# Patient Record
Sex: Male | Born: 1940 | Race: White | Hispanic: No | Marital: Married | Smoking: Former smoker
Health system: Southern US, Community
[De-identification: ages and names within clinical notes are randomized; demographics above are authoritative.]

## PROBLEM LIST (undated history)

## (undated) DIAGNOSIS — N390 Urinary tract infection, site not specified: Secondary | ICD-10-CM

## (undated) DIAGNOSIS — I10 Essential (primary) hypertension: Secondary | ICD-10-CM

## (undated) DIAGNOSIS — J449 Chronic obstructive pulmonary disease, unspecified: Secondary | ICD-10-CM

## (undated) DIAGNOSIS — G20A1 Parkinson's disease without dyskinesia, without mention of fluctuations: Secondary | ICD-10-CM

## (undated) DIAGNOSIS — K219 Gastro-esophageal reflux disease without esophagitis: Secondary | ICD-10-CM

## (undated) DIAGNOSIS — N400 Enlarged prostate without lower urinary tract symptoms: Secondary | ICD-10-CM

## (undated) DIAGNOSIS — R748 Abnormal levels of other serum enzymes: Secondary | ICD-10-CM

## (undated) DIAGNOSIS — M75100 Unspecified rotator cuff tear or rupture of unspecified shoulder, not specified as traumatic: Secondary | ICD-10-CM

## (undated) DIAGNOSIS — B1910 Unspecified viral hepatitis B without hepatic coma: Secondary | ICD-10-CM

## (undated) DIAGNOSIS — G20 Parkinson's disease: Secondary | ICD-10-CM

## (undated) DIAGNOSIS — J439 Emphysema, unspecified: Secondary | ICD-10-CM

## (undated) DIAGNOSIS — K259 Gastric ulcer, unspecified as acute or chronic, without hemorrhage or perforation: Secondary | ICD-10-CM

## (undated) HISTORY — PX: TONSILLECTOMY: SUR1361

## (undated) HISTORY — PX: CHOLECYSTECTOMY: SHX55

## (undated) HISTORY — DX: Emphysema, unspecified: J43.9

## (undated) HISTORY — DX: Benign prostatic hyperplasia without lower urinary tract symptoms: N40.0

## (undated) HISTORY — PX: TRANSURETHRAL MICROWAVE THERAPY: SUR1394

---

## 2003-02-04 ENCOUNTER — Encounter: Payer: Self-pay | Admitting: Internal Medicine

## 2003-12-04 ENCOUNTER — Observation Stay (HOSPITAL_COMMUNITY): Admission: EM | Admit: 2003-12-04 | Discharge: 2003-12-05 | Payer: Self-pay | Admitting: Emergency Medicine

## 2004-08-17 ENCOUNTER — Ambulatory Visit: Payer: Self-pay | Admitting: Internal Medicine

## 2004-09-21 ENCOUNTER — Ambulatory Visit: Payer: Self-pay | Admitting: Internal Medicine

## 2004-11-19 ENCOUNTER — Emergency Department: Payer: Self-pay | Admitting: General Practice

## 2004-11-22 ENCOUNTER — Encounter: Payer: Self-pay | Admitting: Internal Medicine

## 2004-12-21 ENCOUNTER — Ambulatory Visit: Payer: Self-pay | Admitting: Internal Medicine

## 2005-02-15 ENCOUNTER — Ambulatory Visit: Payer: Self-pay | Admitting: Internal Medicine

## 2005-04-09 ENCOUNTER — Emergency Department: Payer: Self-pay | Admitting: Emergency Medicine

## 2005-04-16 ENCOUNTER — Ambulatory Visit: Payer: Self-pay | Admitting: Urology

## 2005-05-02 ENCOUNTER — Ambulatory Visit: Payer: Self-pay | Admitting: Urology

## 2005-05-02 ENCOUNTER — Other Ambulatory Visit: Payer: Self-pay

## 2005-06-07 ENCOUNTER — Ambulatory Visit: Payer: Self-pay | Admitting: Internal Medicine

## 2005-10-18 ENCOUNTER — Ambulatory Visit: Payer: Self-pay | Admitting: Internal Medicine

## 2006-04-11 ENCOUNTER — Ambulatory Visit: Payer: Self-pay | Admitting: Internal Medicine

## 2006-05-27 ENCOUNTER — Ambulatory Visit: Payer: Self-pay | Admitting: Family Medicine

## 2006-05-30 ENCOUNTER — Ambulatory Visit: Payer: Self-pay | Admitting: Family Medicine

## 2006-06-06 ENCOUNTER — Ambulatory Visit: Payer: Self-pay | Admitting: Internal Medicine

## 2006-07-22 ENCOUNTER — Ambulatory Visit: Payer: Self-pay | Admitting: Internal Medicine

## 2006-12-05 ENCOUNTER — Ambulatory Visit: Payer: Self-pay | Admitting: Internal Medicine

## 2006-12-05 DIAGNOSIS — M7500 Adhesive capsulitis of unspecified shoulder: Secondary | ICD-10-CM | POA: Insufficient documentation

## 2006-12-12 ENCOUNTER — Encounter: Payer: Self-pay | Admitting: Internal Medicine

## 2007-01-02 ENCOUNTER — Encounter: Payer: Self-pay | Admitting: Internal Medicine

## 2007-02-27 ENCOUNTER — Ambulatory Visit: Payer: Self-pay | Admitting: Internal Medicine

## 2007-02-27 DIAGNOSIS — K279 Peptic ulcer, site unspecified, unspecified as acute or chronic, without hemorrhage or perforation: Secondary | ICD-10-CM | POA: Insufficient documentation

## 2007-02-27 DIAGNOSIS — M719 Bursopathy, unspecified: Secondary | ICD-10-CM

## 2007-02-27 DIAGNOSIS — I10 Essential (primary) hypertension: Secondary | ICD-10-CM | POA: Insufficient documentation

## 2007-02-27 DIAGNOSIS — N400 Enlarged prostate without lower urinary tract symptoms: Secondary | ICD-10-CM | POA: Insufficient documentation

## 2007-02-27 DIAGNOSIS — K219 Gastro-esophageal reflux disease without esophagitis: Secondary | ICD-10-CM | POA: Insufficient documentation

## 2007-02-27 DIAGNOSIS — Z860100 Personal history of colon polyps, unspecified: Secondary | ICD-10-CM | POA: Insufficient documentation

## 2007-02-27 DIAGNOSIS — J449 Chronic obstructive pulmonary disease, unspecified: Secondary | ICD-10-CM | POA: Insufficient documentation

## 2007-02-27 DIAGNOSIS — M67919 Unspecified disorder of synovium and tendon, unspecified shoulder: Secondary | ICD-10-CM | POA: Insufficient documentation

## 2007-05-22 ENCOUNTER — Telehealth (INDEPENDENT_AMBULATORY_CARE_PROVIDER_SITE_OTHER): Payer: Self-pay | Admitting: *Deleted

## 2007-06-05 DIAGNOSIS — N200 Calculus of kidney: Secondary | ICD-10-CM | POA: Insufficient documentation

## 2007-07-03 ENCOUNTER — Ambulatory Visit: Payer: Self-pay | Admitting: Internal Medicine

## 2007-07-03 DIAGNOSIS — J019 Acute sinusitis, unspecified: Secondary | ICD-10-CM | POA: Insufficient documentation

## 2007-07-06 LAB — CONVERTED CEMR LAB
BUN: 10 mg/dL (ref 6–23)
Basophils Absolute: 0.1 10*3/uL (ref 0.0–0.1)
Basophils Relative: 0.7 % (ref 0.0–1.0)
Eosinophils Absolute: 0.2 10*3/uL (ref 0.0–0.6)
GFR calc non Af Amer: 79 mL/min
MCHC: 32.6 g/dL (ref 30.0–36.0)
MCV: 103.5 fL — ABNORMAL HIGH (ref 78.0–100.0)
Neutrophils Relative %: 56.4 % (ref 43.0–77.0)
RBC: 4.8 M/uL (ref 4.22–5.81)
RDW: 12.2 % (ref 11.5–14.6)
Sodium: 141 meq/L (ref 135–145)
TSH: 0.41 microintl units/mL (ref 0.35–5.50)
WBC: 7.4 10*3/uL (ref 4.5–10.5)

## 2007-07-14 ENCOUNTER — Telehealth (INDEPENDENT_AMBULATORY_CARE_PROVIDER_SITE_OTHER): Payer: Self-pay | Admitting: *Deleted

## 2007-08-27 ENCOUNTER — Telehealth (INDEPENDENT_AMBULATORY_CARE_PROVIDER_SITE_OTHER): Payer: Self-pay | Admitting: *Deleted

## 2008-01-01 ENCOUNTER — Ambulatory Visit: Payer: Self-pay | Admitting: Internal Medicine

## 2008-01-06 ENCOUNTER — Other Ambulatory Visit: Payer: Self-pay

## 2008-01-06 ENCOUNTER — Emergency Department: Payer: Self-pay | Admitting: Emergency Medicine

## 2008-02-15 ENCOUNTER — Encounter: Payer: Self-pay | Admitting: Internal Medicine

## 2008-02-16 ENCOUNTER — Ambulatory Visit: Payer: Self-pay | Admitting: Internal Medicine

## 2008-02-17 ENCOUNTER — Telehealth: Payer: Self-pay | Admitting: Internal Medicine

## 2008-03-16 ENCOUNTER — Ambulatory Visit: Payer: Self-pay | Admitting: Unknown Physician Specialty

## 2008-03-16 ENCOUNTER — Encounter: Payer: Self-pay | Admitting: Internal Medicine

## 2008-05-19 ENCOUNTER — Encounter: Payer: Self-pay | Admitting: Internal Medicine

## 2008-07-08 ENCOUNTER — Telehealth: Payer: Self-pay | Admitting: Internal Medicine

## 2009-01-13 ENCOUNTER — Ambulatory Visit: Payer: Self-pay | Admitting: Anesthesiology

## 2009-03-10 ENCOUNTER — Encounter: Payer: Self-pay | Admitting: Internal Medicine

## 2009-04-28 ENCOUNTER — Ambulatory Visit: Payer: Self-pay | Admitting: Unknown Physician Specialty

## 2010-06-10 LAB — CONVERTED CEMR LAB
Bilirubin Urine: NEGATIVE
Blood in Urine, dipstick: NEGATIVE
Glucose, Urine, Semiquant: NEGATIVE
Ketones, urine, test strip: NEGATIVE
Nitrite: NEGATIVE
Protein, U semiquant: NEGATIVE
Specific Gravity, Urine: 1.02
Urobilinogen, UA: 0.2
WBC Urine, dipstick: NEGATIVE
pH: 6

## 2010-08-05 ENCOUNTER — Observation Stay (HOSPITAL_COMMUNITY)
Admission: EM | Admit: 2010-08-05 | Discharge: 2010-08-07 | DRG: 087 | Disposition: A | Discharge status: Home / Self Care | Payer: BC Managed Care – PPO | Attending: Internal Medicine | Admitting: Internal Medicine

## 2010-08-05 ENCOUNTER — Emergency Department (HOSPITAL_COMMUNITY): Payer: BC Managed Care – PPO

## 2010-08-05 DIAGNOSIS — J440 Chronic obstructive pulmonary disease with acute lower respiratory infection: Secondary | ICD-10-CM | POA: Insufficient documentation

## 2010-08-05 DIAGNOSIS — I10 Essential (primary) hypertension: Secondary | ICD-10-CM | POA: Insufficient documentation

## 2010-08-05 DIAGNOSIS — R05 Cough: Secondary | ICD-10-CM | POA: Insufficient documentation

## 2010-08-05 DIAGNOSIS — J209 Acute bronchitis, unspecified: Secondary | ICD-10-CM | POA: Insufficient documentation

## 2010-08-05 DIAGNOSIS — F17200 Nicotine dependence, unspecified, uncomplicated: Secondary | ICD-10-CM | POA: Insufficient documentation

## 2010-08-05 DIAGNOSIS — J9600 Acute respiratory failure, unspecified whether with hypoxia or hypercapnia: Principal | ICD-10-CM | POA: Insufficient documentation

## 2010-08-05 DIAGNOSIS — E876 Hypokalemia: Secondary | ICD-10-CM | POA: Insufficient documentation

## 2010-08-05 DIAGNOSIS — T7840XA Allergy, unspecified, initial encounter: Secondary | ICD-10-CM | POA: Insufficient documentation

## 2010-08-05 DIAGNOSIS — R109 Unspecified abdominal pain: Secondary | ICD-10-CM | POA: Insufficient documentation

## 2010-08-05 DIAGNOSIS — R059 Cough, unspecified: Secondary | ICD-10-CM | POA: Insufficient documentation

## 2010-08-05 DIAGNOSIS — N400 Enlarged prostate without lower urinary tract symptoms: Secondary | ICD-10-CM | POA: Insufficient documentation

## 2010-08-05 DIAGNOSIS — R0789 Other chest pain: Secondary | ICD-10-CM | POA: Insufficient documentation

## 2010-08-05 LAB — CBC
Hemoglobin: 15.4 g/dL (ref 13.0–17.0)
MCH: 34.8 pg — ABNORMAL HIGH (ref 26.0–34.0)
MCHC: 36 g/dL (ref 30.0–36.0)
Platelets: 179 10*3/uL (ref 150–400)
RBC: 4.43 MIL/uL (ref 4.22–5.81)

## 2010-08-05 LAB — DIFFERENTIAL
Basophils Absolute: 0 10*3/uL (ref 0.0–0.1)
Eosinophils Absolute: 0 10*3/uL (ref 0.0–0.7)
Lymphs Abs: 1.4 10*3/uL (ref 0.7–4.0)
Neutro Abs: 10.6 10*3/uL — ABNORMAL HIGH (ref 1.7–7.7)

## 2010-08-05 LAB — D-DIMER, QUANTITATIVE: D-Dimer, Quant: 0.24 ug/mL-FEU (ref 0.00–0.48)

## 2010-08-05 LAB — POCT I-STAT, CHEM 8
BUN: 15 mg/dL (ref 6–23)
Sodium: 138 mEq/L (ref 135–145)

## 2010-08-05 LAB — POCT CARDIAC MARKERS

## 2010-08-06 LAB — BASIC METABOLIC PANEL
BUN: 9 mg/dL (ref 6–23)
CO2: 28 mEq/L (ref 19–32)
GFR calc Af Amer: >60 mL/min (ref >60)
GFR calc non Af Amer: >60 mL/min (ref >60)
Glucose, Bld: 109 mg/dL — ABNORMAL HIGH (ref 70–99)
Potassium: 3.3 mEq/L — ABNORMAL LOW (ref 3.5–5.1)

## 2010-08-06 LAB — URINE MICROSCOPIC-ADD ON

## 2010-08-06 LAB — CBC
RDW: 13.3 % (ref 11.5–15.5)
WBC: 11.4 10*3/uL — ABNORMAL HIGH (ref 4.0–10.5)

## 2010-08-06 LAB — URINALYSIS, ROUTINE W REFLEX MICROSCOPIC
Glucose, UA: NEGATIVE mg/dL
Ketones, ur: NEGATIVE mg/dL
Nitrite: NEGATIVE
Protein, ur: NEGATIVE mg/dL
Specific Gravity, Urine: 1.018 (ref 1.005–1.030)

## 2010-08-06 LAB — LEGIONELLA ANTIGEN, URINE

## 2010-08-07 LAB — CK TOTAL AND CKMB (NOT AT ARMC): CK, MB: 1.5 ng/mL (ref 0.3–4.0)

## 2010-08-08 NOTE — Discharge Summary (Signed)
NAME:  Patrick Montoya, WALDMAN NO.:  [id number]  MEDICAL RECORD NO.:  [id number]           PATIENT TYPE:  I  LOCATION:  6731                         FACILITY:  [facility]  PHYSICIAN:  Andreas Blower, MD       DATE OF BIRTH:  [birth year]  DATE OF ADMISSION:  08/05/2010 DATE OF DISCHARGE:  08/07/2010                              DISCHARGE SUMMARY   PRIMARY CARE PHYSICIAN:  Mayer Masker, MD  DISCHARGE DIAGNOSES: 1. Acute respiratory failure secondary to acute bronchitis. 2. Cough due to acute bronchitis. 3. Acute bronchitis. 4. Chest pain from cough, musculoskeletal in nature, noncardiac. 5. Abdominal pain from cough, resolved. 6. Hypokalemia, replaced p.r.n. 7. Hypertension. 8. Tobacco use. 9. Allergic reaction to axilla bilaterally from most likely deodorant     use. 10.History of chronic obstructive pulmonary disease. 11.History of benign prostatic hypertrophy.  DISCHARGE MEDICATIONS: 1. Hydrocortisone cream topical twice daily. 2. Azithromycin 250 mg p.o. daily for 2 more days to complete a 5-day     course. 3. Cefuroxime 500 mg p.o. twice daily with meals for a total of 8     days. 4. Guaifenesin 600 mg LA p.o. twice daily. 5. Nicotine 7 mg/24-hour patch daily for 30 days. 6. Aspirin 81 mg p.o. daily. 7. Benzonatate 20 mg p.o. daily. 8. Doxazosin 4 mg p.o. daily. 9. Hydrochlorothiazide 12.5 mg p.o. daily. 10.Potassium chloride 20 mEq p.o. daily. 11.Symbicort 160/4.5 mcg 1 puff daily as needed. 12.Albuterol 90 mcg 1-2 puffs every 6 hours as needed for shortness of     breath.  BRIEF ADMITTING HISTORY AND PHYSICAL:  Patrick Montoya is a 70 year old gentleman with history of COPD, tobacco use, BPH, and hypertension who presents with chest pain and shortness of breath.  RADIOLOGY/IMAGING:  The patient had chest x-ray two-view, which shows chronic lung disease with increased interstitial markings in both lungs compared to prior study in 2005.  Findings could be  secondary to progressive chronic lung disease or superimposed interstitial edema.  LABORATORY DATA:  CBC shows a white count of 11.4, hemoglobin 14.3, hematocrit 40.4, platelet count 175.  Electrolytes normal except potassium is 3.3, BUN is 9, creatinine is 0.87.  CK is 100, troponin negative.  UA was negative for nitrites, small leukocytes.  Urine Legionella was negative.  HOSPITAL COURSE BY PROBLEM: 1. Acute respiratory failure secondary to bronchitis.  Initially, the     patient was started on ceftriaxone and azithromycin with good     improvement in his breathing as antibiotics were transitioned to     cefuroxime, which he will continue for 8 more days and azithromycin     for 2 more days.  Given the patient has extensive smoking history,     I had an extensive discussion with the patient about the benefits     of smoking cessation.  He reports that he is interested in smoking     cessation.  Smoking cessation number was provided for the patient     and prescription for nicotine patches was provided.  Given the     chest x-ray shows questionable chronic lung findings, the patient  is a Psychologist, occupational by profession.  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  I also had a discussion with the patient about     having the patient to be seen by Pulmonary as an outpatient at the     discretion of his primary care physician for further management. 2. Chest pain most likely secondary to acute bronchitis and cough     resolved during the course of the hospital stay.  Troponin and CK-     MB were normal.  Initial EKG shows sinus tachycardia which has     resolved. 3. Hypokalemia, replaced as needed. 4. Hypertension, stable.  Continue the patient on home medications. 5. Tobacco use.  Again encouraged cessation.  The patient was given     nicotine patch and smoking cessation telephone number.  I spent     more than 10 minutes counseling the patient about smoking      cessation. 6. Allergic reaction in the axilla bilaterally.  The patient reports     that he uses deodorant subsequently, after that has an allergic     reaction about 2 or 3 days later.  The patient was given     hydrocortisone cream and instructed not to take deodorant as that     seems to be the trigger for the allergic reaction.  Improved during     the course of his hospital stay. 7. COPD, stable.  Continue outpatient inhalers.  Time spent on discharge talking to the patient, the patient's wife, and coordinating care was 25 minutes.   Andreas Blower, MD   SR/MEDQ  D:  08/07/2010  T:  08/08/2010  Job:  [id number]  Electronically Signed by Wardell Heath Taheerah Guldin  on 08/08/2010 02:18:56 PM

## 2010-08-25 NOTE — H&P (Signed)
NAME:  Patrick Montoya, Patrick Montoya NO.:  [id number]  MEDICAL RECORD NO.:  [id number]           PATIENT TYPE:  E  LOCATION:  [facility]                         FACILITY:  [facility]  PHYSICIAN:  Calvert Cantor, M.D.     DATE OF BIRTH:  05-Aug-1940  DATE OF ADMISSION:  08/05/2010 DATE OF DISCHARGE:                             HISTORY & PHYSICAL   PCP:  Dr. Mayer Masker in [place].  PRESENTING COMPLAINT:  Chest pain.  HISTORY OF PRESENT ILLNESS:  This is a 70 year old male with a past medical history of emphysema, BPH, hypertension.  The patient continues to smoke.  The patient noticed chest pain last night present across the center of his chest.  It was 10/10.  He also felt like his heart rate was increased.  The pain lasted all throughout the night and has actually improved in the ER.  He did call EMS and upon arrival of the EMS, it was noted that his pulse ox was 88% on room air and his systolic blood pressure was in the 80s.  He was given three sublingual nitroglycerin and aspirin without relief of his chest pain.  He does admit that it is worse when he takes a deep breath.  The patient admits to having a productive cough for the past 4-5 weeks. Sputum that he is bringing up is now green.  He states about 2 weeks ago, he went to Urgent Care and received an antibiotic, which he took three times a day for about 5 or 6 days without any improvement.  He has also felt feverish today and is noted to have a temperature of 100.3.  PAST MEDICAL HISTORY: 1. Emphysema. 2. BPH. 3. Hypertension.  PAST SURGICAL HISTORY:  None.  SOCIAL HISTORY:  Smokes half a pack a day.  Drinks alcohol occasionally. Does not use drugs.  He is married.  He is a Visual merchandiser.  ALLERGIES:  No known drug allergies.  HOME MEDICATIONS: 1. Benzonatate 200 mg.  He has been taking it once a day. 2. Potassium 20 mEq daily. 3. Aspirin 81 mg daily. 4. Doxazosin 4 mg daily. 5. Hydrochlorothiazide.  He believes  it is 12.5 a day but is not sure. 6. Ventolin inhaler. 7. Symbicort inhaler.  He uses this occasionally.  REVIEW OF SYSTEMS:  No weight loss or weight gain.  He has been fatigued.  He has had fevers and chills.  HEENT:  No blurred vision or double vision.  No sore throat, but has drainage from his nose and postnasal drip.  No earache or tinnitus.  He has decreased hearing. RESPIRATORY:  Per HPI with shortness of breath and cough.  CARDIAC:  Per HPI.  No ankle edema.  No orthopnea.  GI:  Has occasional diarrhea, otherwise no nausea, vomiting, constipation, or abdominal pain.  GU: Has burning micturition on and off.  Had some last week, but not today. HEMATOLOGIC:  Bruises easily.  SKIN:  Has rash in his axilla, which he relates to his deodorant.  NEUROLOGIC:  No history of strokes or seizures.  No focal numbness or weakness.  MUSCULOSKELETAL:  Has joint pain.  PSYCHOLOGIC:  No anxiety or depression.  PHYSICAL EXAM:  GENERAL:  Elderly man sitting up in bed, in no acute distress. VITAL SIGNS:  Blood pressure 114/76, pulse 99, respiratory rate 16, temperature 97.4 going up to 100.3, oxygen 92% on 2 liters of oxygen. HEENT:  Pupils equal, round, and reactive to light.  Extraocular movements are intact.  Conjunctiva is pink.  No scleral icterus.  Oral mucosa is moist.  He is edentulous.  Oropharynx is clear. NECK:  Supple.  No thyromegaly, lymphadenopathy, or carotid bruits. HEART:  Regular rate and rhythm.  No murmurs, rubs, or gallops. LUNGS:  Good air entry bilaterally.  Normal respiratory effort.  He does have a cough when he takes a deep breath. ABDOMEN:  Soft, but tender across the upper abdomen, nondistended. Bowel sounds positive. EXTREMITIES:  No cyanosis, clubbing, or edema.  Pedal pulses positive. NEUROLOGIC:  Cranial nerves II-XII intact.  Strength intact in all 4 extremities. PSYCHOLOGIC:  Awake, alert, oriented x3.  Mood and affect normal. SKIN:  Warm and dry.  He does have  a pink scaly-appearing rash in both axillary areas.  LABORATORY DATA:  Blood work; WBC count is elevated at 13.1.  The rest of his CBC is normal.  Metabolic panel reveals a potassium of 3.4, rest is normal.  Chest x-ray reveals chronic lung disease with increased interstitial markings in both lung bases compared with prior study from 2005.  Findings could be secondary to progressive lung disease or mild superimposed interstitial edema.  IMAGING:  EKG reveals a sinus tach at 101 beats per minute.  ASSESSMENT AND PLAN: 1. Acute respiratory failure secondary to acute bronchitis.  He has     been given Rocephin and Zithromax in the ER.  I will continue     these.  We will get urine Legionella antigen.  He will have     nebulizer treatments and O2.  We will continue Tessalon Perles and     add Mucinex. 2. Chest pain, likely secondary to above.  No further workup. 3. Sepsis secondary to acute respiratory failure. 4. Mild hypokalemia, which has been replaced in the ER.  We will     recheck tomorrow.  He has received 40 mg of potassium. 5. Hypertension.  Resume home meds when dosage confirmed. 6. Benign prostatic hypertrophy. 7. Nicotine abuse.  I have counseled him.  He states he will be     starting a program.  We will order a nicotine patch for him. 8. Allergic reaction with rash in both axilla.  We will start     hydrocortisone ointment for him.  Please mention that the patient wishes to be a full code, DVT prophylaxis with Lovenox.  Time on admission 40 minutes.     Calvert Cantor, M.D.     SR/MEDQ  D:  08/05/2010  T:  08/05/2010  Job:  [id number]  cc:   Dr. Mayer Masker  Electronically Signed by Calvert Cantor M.D. on 08/25/2010 04:17:39 PM

## 2010-08-28 ENCOUNTER — Emergency Department (HOSPITAL_COMMUNITY): Payer: BC Managed Care – PPO

## 2010-08-28 ENCOUNTER — Inpatient Hospital Stay (HOSPITAL_COMMUNITY)
Admission: EM | Admit: 2010-08-28 | Discharge: 2010-08-31 | DRG: 493 | Disposition: A | Discharge status: Home / Self Care | Payer: BC Managed Care – PPO | Attending: Surgery | Admitting: Surgery

## 2010-08-28 ENCOUNTER — Emergency Department (HOSPITAL_COMMUNITY)
Admission: EM | Admit: 2010-08-28 | Discharge: 2010-08-28 | Disposition: A | Discharge status: Home / Self Care | Payer: BC Managed Care – PPO | Attending: Emergency Medicine | Admitting: Emergency Medicine

## 2010-08-28 DIAGNOSIS — K8000 Calculus of gallbladder with acute cholecystitis without obstruction: Principal | ICD-10-CM | POA: Diagnosis present

## 2010-08-28 DIAGNOSIS — Z87891 Personal history of nicotine dependence: Secondary | ICD-10-CM

## 2010-08-28 DIAGNOSIS — R0989 Other specified symptoms and signs involving the circulatory and respiratory systems: Secondary | ICD-10-CM | POA: Insufficient documentation

## 2010-08-28 DIAGNOSIS — N400 Enlarged prostate without lower urinary tract symptoms: Secondary | ICD-10-CM | POA: Diagnosis present

## 2010-08-28 DIAGNOSIS — R0609 Other forms of dyspnea: Secondary | ICD-10-CM | POA: Insufficient documentation

## 2010-08-28 DIAGNOSIS — R109 Unspecified abdominal pain: Secondary | ICD-10-CM | POA: Insufficient documentation

## 2010-08-28 DIAGNOSIS — Z79899 Other long term (current) drug therapy: Secondary | ICD-10-CM | POA: Insufficient documentation

## 2010-08-28 DIAGNOSIS — J449 Chronic obstructive pulmonary disease, unspecified: Secondary | ICD-10-CM | POA: Diagnosis present

## 2010-08-28 DIAGNOSIS — R05 Cough: Secondary | ICD-10-CM | POA: Insufficient documentation

## 2010-08-28 DIAGNOSIS — R079 Chest pain, unspecified: Secondary | ICD-10-CM | POA: Insufficient documentation

## 2010-08-28 DIAGNOSIS — R059 Cough, unspecified: Secondary | ICD-10-CM | POA: Insufficient documentation

## 2010-08-28 DIAGNOSIS — I10 Essential (primary) hypertension: Secondary | ICD-10-CM | POA: Diagnosis present

## 2010-08-28 DIAGNOSIS — J4489 Other specified chronic obstructive pulmonary disease: Secondary | ICD-10-CM | POA: Insufficient documentation

## 2010-08-28 DIAGNOSIS — K8020 Calculus of gallbladder without cholecystitis without obstruction: Secondary | ICD-10-CM | POA: Insufficient documentation

## 2010-08-28 DIAGNOSIS — Z7982 Long term (current) use of aspirin: Secondary | ICD-10-CM

## 2010-08-28 DIAGNOSIS — R6889 Other general symptoms and signs: Secondary | ICD-10-CM | POA: Insufficient documentation

## 2010-08-28 DIAGNOSIS — J3489 Other specified disorders of nose and nasal sinuses: Secondary | ICD-10-CM | POA: Insufficient documentation

## 2010-08-28 DIAGNOSIS — R0602 Shortness of breath: Secondary | ICD-10-CM | POA: Insufficient documentation

## 2010-08-28 DIAGNOSIS — M546 Pain in thoracic spine: Secondary | ICD-10-CM | POA: Insufficient documentation

## 2010-08-28 DIAGNOSIS — R10816 Epigastric abdominal tenderness: Secondary | ICD-10-CM | POA: Insufficient documentation

## 2010-08-28 DIAGNOSIS — K219 Gastro-esophageal reflux disease without esophagitis: Secondary | ICD-10-CM | POA: Diagnosis present

## 2010-08-28 LAB — DIFFERENTIAL
Basophils Relative: 0 % (ref 0–1)
Eosinophils Absolute: 0.4 10*3/uL (ref 0.0–0.7)
Lymphocytes Relative: 28 % (ref 12–46)
Lymphs Abs: 2.5 10*3/uL (ref 0.7–4.0)
Monocytes Relative: 9 % (ref 3–12)
Neutro Abs: 5.2 10*3/uL (ref 1.7–7.7)
Neutrophils Relative %: 59 % (ref 43–77)

## 2010-08-28 LAB — CBC
HCT: 40 % (ref 39.0–52.0)
Hemoglobin: 13.8 g/dL (ref 13.0–17.0)
MCH: 33.3 pg (ref 26.0–34.0)
MCHC: 34.5 g/dL (ref 30.0–36.0)
MCV: 96.6 fL (ref 78.0–100.0)
Platelets: 172 10*3/uL (ref 150–400)
RDW: 13.3 % (ref 11.5–15.5)

## 2010-08-28 LAB — COMPREHENSIVE METABOLIC PANEL
BUN: 17 mg/dL (ref 6–23)
CO2: 28 mEq/L (ref 19–32)
Calcium: 8.9 mg/dL (ref 8.4–10.5)
Chloride: 104 mEq/L (ref 96–112)
Creatinine, Ser: 0.91 mg/dL (ref 0.4–1.5)
Glucose, Bld: 95 mg/dL (ref 70–99)
Total Bilirubin: 0.5 mg/dL (ref 0.3–1.2)

## 2010-08-28 LAB — D-DIMER, QUANTITATIVE: D-Dimer, Quant: 0.22 ug/mL-FEU (ref 0.00–0.48)

## 2010-08-28 LAB — POCT CARDIAC MARKERS: Myoglobin, poc: 62.9 ng/mL (ref 12–200)

## 2010-08-28 MED ORDER — IOHEXOL 300 MG/ML  SOLN
100.0000 mL | Freq: Once | INTRAMUSCULAR | Status: AC | PRN
  Administered 2010-08-28: 80 mL via INTRAVENOUS

## 2010-08-29 ENCOUNTER — Emergency Department (HOSPITAL_COMMUNITY): Payer: BC Managed Care – PPO

## 2010-08-29 ENCOUNTER — Other Ambulatory Visit: Payer: Self-pay | Admitting: Surgery

## 2010-08-29 LAB — COMPREHENSIVE METABOLIC PANEL
ALT: 71 U/L — ABNORMAL HIGH (ref 0–53)
AST: 53 U/L — ABNORMAL HIGH (ref 0–37)
Alkaline Phosphatase: 76 U/L (ref 39–117)
CO2: 27 mEq/L (ref 19–32)
Chloride: 100 mEq/L (ref 96–112)
GFR calc Af Amer: >60 mL/min (ref >60)
GFR calc non Af Amer: >60 mL/min (ref >60)
Potassium: 3.3 mEq/L — ABNORMAL LOW (ref 3.5–5.1)
Sodium: 135 mEq/L (ref 135–145)
Total Bilirubin: 0.8 mg/dL (ref 0.3–1.2)

## 2010-08-29 LAB — URINALYSIS, ROUTINE W REFLEX MICROSCOPIC
Bilirubin Urine: NEGATIVE
Hgb urine dipstick: NEGATIVE
Protein, ur: NEGATIVE mg/dL
Specific Gravity, Urine: 1.02 (ref 1.005–1.030)
Urobilinogen, UA: 1 mg/dL (ref 0.0–1.0)

## 2010-08-29 LAB — DIFFERENTIAL
Basophils Absolute: 0 10*3/uL (ref 0.0–0.1)
Basophils Relative: 0 % (ref 0–1)
Lymphocytes Relative: 15 % (ref 12–46)
Neutro Abs: 8.5 10*3/uL — ABNORMAL HIGH (ref 1.7–7.7)
Neutrophils Relative %: 76 % (ref 43–77)

## 2010-08-29 LAB — CBC
Hemoglobin: 14.1 g/dL (ref 13.0–17.0)
RBC: 4.31 MIL/uL (ref 4.22–5.81)

## 2010-08-29 MED ORDER — IOHEXOL 300 MG/ML  SOLN
100.0000 mL | Freq: Once | INTRAMUSCULAR | Status: AC | PRN
  Administered 2010-08-29: 100 mL via INTRAVENOUS

## 2010-08-30 LAB — COMPREHENSIVE METABOLIC PANEL
ALT: 68 U/L — ABNORMAL HIGH (ref 0–53)
AST: 55 U/L — ABNORMAL HIGH (ref 0–37)
Albumin: 2.7 g/dL — ABNORMAL LOW (ref 3.5–5.2)
CO2: 29 mEq/L (ref 19–32)
Calcium: 8.2 mg/dL — ABNORMAL LOW (ref 8.4–10.5)
GFR calc Af Amer: >60 mL/min (ref >60)
Sodium: 133 mEq/L — ABNORMAL LOW (ref 135–145)
Total Protein: 6 g/dL (ref 6.0–8.3)

## 2010-08-30 LAB — CBC
Hemoglobin: 12.6 g/dL — ABNORMAL LOW (ref 13.0–17.0)
MCHC: 34.1 g/dL (ref 30.0–36.0)
RDW: 13.1 % (ref 11.5–15.5)

## 2010-08-30 LAB — URINE CULTURE

## 2010-08-30 NOTE — Op Note (Signed)
NAME:  Patrick Montoya, Patrick Montoya NO.:  [id number]  MEDICAL RECORD NO.:  [id number]           PATIENT TYPE:  I  LOCATION:  5149                         FACILITY:  [facility]  PHYSICIAN:  Thornton Park. Daphine Deutscher, MD  DATE OF BIRTH:  08-29-1940  DATE OF PROCEDURE:  08/29/2010 DATE OF DISCHARGE:                              OPERATIVE REPORT   PREOPERATIVE DIAGNOSIS:  Cholecystitis.  POSTOPERATIVE DIAGNOSIS:  Subacute and acute cholecystitis with purulent gallbladder.  PROCEDURE:  Laparoscopic cholecystectomy with intraoperative cholangiogram.  SURGEON:  Thornton Park. Daphine Deutscher, MD  ASSISTANT:  None.  ANESTHESIA:  General endotracheal.  FINDINGS:  Significant inflammatory changes with transverse colon or stuck to the gallbladder with significant purulence on initially entering and making this dissection.  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  Normal intraoperative cholangiogram.  DESCRIPTION OF PROCEDURE:  This is a 70 year old white male came from the emergency room to the __________ Service and on Wednesday, August 29, 2010, he was taken to OR #18, given general anesthesia.  The abdomen was prepped with PCMX and draped sterilely.  The abdomen was entered through the right upper quadrant using a 5-mm 0 degree OptiVu without difficulty.  The abdomen was insufflated and two other 5-mm trocars were used as well as an 11 in the upper midline placed very obliquely. Gallbladder was completely walled off.  I was able to use blunt dissection with hydrodissection to peel the gallbladder away from the surrounding inflammatory mass and this was done without injuring the bowel or without the same just in the way of bleeding.  The gallbladder decompressed somewhat where the pus was running out and I grasped and elevated it.  Calot's region triangle was dissected and I was fortunate and being able to achieve a critical view only little amount of inflammatory change. However, I  dissected free the cystic duct and the cystic artery.  I clipped the cystic artery and put a clip upon the gallbladder and incised the cystic duct.  I inserted a Reddick catheter, held in place the clip, and got a cholangiogram which showed good intrahepatic filling as well as free flow in the duodenum.  There was also little flash into the pancreatic duct.  Cystic duct was then triple clipped and divided.  Cystic artery was divided.  Then the gallbladder was removed from the gallbladder bed finding one other branches of the cystic artery controlling that on the gallbladder.  I then removed the gallbladder.  I opened near the top edge of the back wall seemed to be necrotic and had a couple of stones that I fell out.  Otherwise, the gallbladder and the stones were extracted and placed in a bag, brought out through the upper midline incision after I did dilate it.  However, it was very oblique and the fascial openings were offset significantly.  The gallbladder bed was little raw.  Bleeding will be controlled with electrocautery.  I put a piece of Surgicel in there.  No other abnormalities were noted.  The port sites were all injected with Marcaine.  The wounds were closed with 4-0 Vicryl, Benzoin and Steri-Strips.  The patient tolerated the procedure well and was taken to the recovery room in satisfactory addition.  He will be admitted for overnight observation.     Thornton Park Daphine Deutscher, MD     MBM/MEDQ  D:  08/29/2010  T:  08/30/2010  Job:  [id number]  Electronically Signed by Luretha Murphy MD on 08/30/2010 08:18:46 AM

## 2010-08-31 LAB — BASIC METABOLIC PANEL
BUN: 8 mg/dL (ref 6–23)
Creatinine, Ser: 0.84 mg/dL (ref 0.4–1.5)
GFR calc non Af Amer: >60 mL/min (ref >60)
Glucose, Bld: 131 mg/dL — ABNORMAL HIGH (ref 70–99)

## 2010-08-31 LAB — CBC
MCH: 33 pg (ref 26.0–34.0)
MCV: 96.1 fL (ref 78.0–100.0)
Platelets: 127 10*3/uL — ABNORMAL LOW (ref 150–400)
RDW: 13 % (ref 11.5–15.5)

## 2010-09-04 LAB — CULTURE, BLOOD (ROUTINE X 2)
Culture  Setup Time: 201204180922
Culture: NO GROWTH

## 2010-09-13 NOTE — H&P (Signed)
NAME:  Patrick Montoya, Patrick Montoya NO.:  [id number]  MEDICAL RECORD NO.:  [id number]           PATIENT TYPE:  E  LOCATION:  [facility]                         FACILITY:  [facility]  PHYSICIAN:  Thornton Park. Daphine Deutscher, MD  DATE OF BIRTH:  06/20/1940  DATE OF ADMISSION:  08/29/2010 DATE OF DISCHARGE:                             HISTORY & PHYSICAL   PRIMARY CARE PHYSICIAN:  Robert A. Nicholos Johns, MD  CHIEF COMPLAINT:  Abdominal pain.  HISTORY OF PRESENT ILLNESS:  [name] is a 70-year-old Caucasian male who states over the past couple of days he has had right-sided chest discomfort with cough as well as right-sided upper abdominal pain.  He actually presented to the emergency department yesterday with complaint of more chest pain and cough.  He was seen in the emergency department and had a chest x-ray and CT scan of this chest that did not show any significant findings of acute process.  He actually had an ultrasound of his gallbladder which showed multiple stones, but no inflammatory changes.  He was released from the emergency department, but the pain persists, in fact worsened, but became more localized to the right upper quadrant, so he returned earlier this morning.  At this time, a CT scan of his abdomen was ordered and at this time it shows significant inflammatory changes of the gallbladder in addition to his gallstones. The patient has become a little bit more febrile over the past couple of days as well.  We have been called to see the patient for acute cholecystitis.  The patient describes being admitted about a month ago for mild pneumonia.  He completed an antibiotic course and has not had any residual complications from that.  He has had no known prior history of gallbladder disease.  PAST MEDICAL HISTORY:  The patient has a history of hypertension, COPD, gastroesophageal reflux disease, and possibly benign prostatic hypertrophy.  PAST SURGICAL HISTORY:  Negative.  FAMILY  HISTORY:  Noncontributory to present case.  SOCIAL HISTORY:  The patient is married.  He continued to work as a Psychologist, occupational.  He is in the process of quitting smoking.  He states he quit approximately 3 weeks ago with that bout of pneumonia.  He is currently using a patch system.  However, he was a pack a day smoker for the previous 55 years.  No illicit drug use and rare alcohol use.  MEDICATIONS: 1. Symbicort inhaler 1 puff daily. 2. Potassium supplementation 20 mEq daily. 3. Omeprazole 40 mg daily. 4. Nicotine 7 mg transdermal patch daily. 5. Hydrochlorothiazide 12.5 mg daily. 6. Doxazosin 4 mg daily. 7. Aspirin 81 mg daily.  ALLERGIES:  No known drug or latex allergies.  REVIEW OF SYSTEMS:  Please see history of present illness for pertinent findings, otherwise complete 10 systems are reviewed and found to be negative.  PHYSICAL EXAMINATION:  GENERAL APPEARANCE:  This is a 70 year old thin Caucasian male who does not appear toxic or any acute distress. VITAL SIGNS:  Current vital signs show a temperature of 98.9, blood pressure 107/70, heart rate of 85, respiratory rate of 18, and oxygen saturation 96% on  room air. ENT:  Unremarkable. NECK:  Supple without lymphadenopathy.  Trachea is midline.  No thyromegaly or masses. LUNGS:  Clear to auscultation.  No wheezes, rhonchi, or rales. HEART:  Regular rate and rhythm.  No murmurs, gallops, or rubs. Carotids are 2+ and brisk without bruit.  Peripheral pulses are intact and symmetrical. ABDOMEN:  Soft.  No surgical scars are noted.  No hernias are appreciated.  No mass effect.  No organomegaly.  The patient is tender in the right upper quadrant without peritonitis. RECTAL:  Deferred. SKIN:  Otherwise warm and dry with good turgor.  No rashes, lesions, or nodules noted. NEUROLOGIC:  The patient is alert and oriented x3.  DIAGNOSTICS:  CBC today shows a white blood cell count of 11.3, hemoglobin of 14.1, hematocrit of 41.5, and  platelet count of 164. Metabolic panel shows a sodium of 135, potassium of 3.3, chloride of 100, CO2 of 27, BUN of 13, creatinine of 0.9, bilirubin normal at 0.8, alkaline phosphatase of 76, AST of 53, ALT of 71, and lipase normal at 23.  IMAGING:  Ultrasound and confirmation by CT scan today shows multiple gallstones.  There are inflammatory changes of the gallbladder with wall thickening and inflammatory stranding around the gallbladder suggestive of cholecystitis.  The patient incidentally has a small inguinal hernia containing fluid.  Prostate is enlarged without mass.  IMPRESSION: 1. Acute cholecystitis. 2. Hypertension. 3. Chronic obstructive pulmonary disease.  PLAN:  We will admit the patient and begin him on cefoxitin 1 g IV q.6 h.  I have discussed with him the need for cholecystectomy.  I have also discussed the procedure of laparoscopic and open cholecystectomy including the risks and complications associated with both.  The patient understands the need for surgical intervention in his case and will agree.     Patrick El, PA-C   ______________________________ Thornton Park Daphine Deutscher, MDKB/MEDQ  D:  08/29/2010  T:  08/29/2010  Job:  [id number]  Electronically Signed by Patrick Montoya  on 09/03/2010 02:10:58 PM Electronically Signed by Patrick Murphy MD on 09/13/2010 08:37:39 AM

## 2010-09-13 NOTE — Discharge Summary (Signed)
  NAME:  YEE, JOSS NO.:  [id number]  MEDICAL RECORD NO.:  [id number]           PATIENT TYPE:  I  LOCATION:  5149                         FACILITY:  [facility]  PHYSICIAN:  Thornton Park. Daphine Deutscher, MD  DATE OF BIRTH:  1941/04/23  DATE OF ADMISSION:  08/28/2010 DATE OF DISCHARGE:  08/31/2010                              DISCHARGE SUMMARY   ADMISSION DIAGNOSES: 1. Acute cholecystitis. 2. Hypertension. 3. Chronic obstructive pulmonary disease with history of tobacco use.  DISCHARGE DIAGNOSES: 1. Acute cholecystitis with purulant gallbladder. 2. Chronic obstructive pulmonary disease. 3. Gastroesophageal reflux disease. 4. Hypertension.  PROCEDURES:  Laparoscopic cholecystectomy August 29, 2010, Dr. Luretha Murphy.  BRIEF HISTORY:  The patient is a 70 year old male who has had right- sided chest discomfort for the last few days.  He came to the ER for evaluation.  CT scan showed inflammatory change in the gallbladder.  The patient became febrile and was diagnosed with acute cholecystitis.  PAST MEDICAL HISTORY: 1. Hypertension. 2. COPD with 50+ year history of tobacco use. 3. GERD. 4. BPH.  For further history and physical, please see the dictated note.  HOSPITAL COURSE:  The patient was admitted and after evaluation placed on antibiotics and then taken to the OR by Dr. Daphine Deutscher.  Underwent procedure as described above.  He tolerated well and returned to the floor in a satisfactory condition.  First postoperative day, he was still having nausea, we slowly his advanced, mobilized, and started on p.o. pain meds.  Today, POD#2 he is up, tolerating diet well, mobilizing without difficulty, and has had a bowel movement.  His wounds looked good, he remains afebrile.  Abdomen is soft and we plan to discharge him home.  He will be discharged home on his preadmission medicines along with Augmentin 875/125 one p.o. b.i.d. for 5 days, Tylox 1-2 p.o. q.4 h. p.r.n. for pain.   It was also recommended he can take Tylenol or ibuprofen for pain.  INSTRUCTIONS:  Not to drive while he is using the oxycodone.  He will return to work in 2 weeks.  DISCHARGE ACTIVITY:  Light to moderate, no lifting over 20 pounds.  He may shower, remove his Steri-Strips in 7 days.  Call our office if her has any problems.     Eber Hong, P.A.   ______________________________ Thornton Park Daphine Deutscher, MD    WDJ/MEDQ  D:  08/31/2010  T:  09/01/2010  Job:  [id number]  cc:   Molly Maduro A. Nicholos Johns, M.D.  Electronically Signed by Sherrie George P.A. on 09/03/2010 11:59:29 AM Electronically Signed by Luretha Murphy MD on 09/13/2010 08:37:33 AM

## 2010-09-28 NOTE — Cardiovascular Report (Signed)
NAME:  Patrick Montoya, Patrick Montoya NO.:  [id number]   MEDICAL RECORD NO.:  [id number]                   PATIENT TYPE:  OBV   LOCATION:  3728                                 FACILITY:  [facility]   PHYSICIAN:  Learta Codding, M.D. [facility]             DATE OF BIRTH:  February 29, 1941   DATE OF PROCEDURE:  12/05/2003  DATE OF DISCHARGE:  12/05/2003                              CARDIAC CATHETERIZATION   CARDIOLOGIST:  Dr. Samule Ohm   PROCEDURES PERFORMED:  1. Left heart catheterization with selective coronary angiography.  2. Ventriculography.   DIAGNOSES:  No evidence of significant flow limiting coronary artery  disease.   INDICATIONS:  Patient is a 70 year old male presenting with substernal chest  pain.  A cardiac catheterization procedure requested to rule out an early  ischemic heart disease.   PROCEDURE:  A 6-French arterial sheath was placed using modified Seldinger  technique.  6-French [id number] and [id number] catheters were used for coronary  angiography.  Ventriculography was performed with a 6-French pigtail  catheter.  No complications were encountered at the end of procedure.   FINDINGS:  HEMODYNAMICS:  Left ventricular pressure 129/9 mmHg.  Aortic pressure 129/80 mmHg.   VENTRICULOGRAPHY:  Ejection fraction 70%.  No wall motion abnormalities.   SELECTIVE CORONARY ANGIOGRAPHY:  1. Left main coronary artery large caliber vessel with no evidence of flow     limiting disease.  2. LAD did not demonstrate any evidence of flow limiting lesions.  3. Circumflex left coronary artery was essentially in normal limits.  4. Right coronary artery and PDA and posterolateral branch were free of flow     limiting lesions.   RECOMMENDATIONS:  No evidence of angiographic significant coronary artery  disease.  Refer to follow up with Dr. Samule Ohm.                                               Learta Codding, M.D. [facility]   GED/MEDQ  D:  01/03/2004  T:  01/04/2004  Job:  [id number]   cc:   Salvadore Farber, M.D. [facility]  [address]  [address]  [place]  [place] [address]

## 2010-09-28 NOTE — H&P (Signed)
NAME:  Patrick Montoya, Patrick Montoya NO.:  [id number]   MEDICAL RECORD NO.:  [id number]                   PATIENT TYPE:  EMS   LOCATION:  MAJO                                 FACILITY:  [facility]   PHYSICIAN:  Victoria Vera Bing, M.D.               DATE OF BIRTH:  Dec 26, 1940   DATE OF ADMISSION:  12/04/2003  DATE OF DISCHARGE:                                HISTORY & PHYSICAL   Referring physician, Dr. Alphonsus Sias.  Primary cardiologist Salvadore Farber,  M.D.   HISTORY OF PRESENT ILLNESS:  A 70 year old gentleman without known coronary  disease presenting with progressive chest pain and dyspnea.  Patrick Montoya was  evaluated by Dr. Samule Ohm approximately three months ago for chest discomfort.  A stress Cardiolite study was advised but was cancelled by the patient and  not rescheduled due to the fact that it interfered with his work.  In the  last week or two he has had progressive dyspnea and chest tightness.  This  occurs intermittently, typically at rest, and resolves in minutes to hours  spontaneously.  He notes increased symptoms when he smokes a cigarette.  He  has chronic lung disease treated with MDIs.  He has taken prednisone in the  past but never required hospitalization  for an exacerbation of COPD.  There  is no definite relationship to exertion.  He has slight chest tightness  continuously with more superimposed discomfort, moderate severity,  intermittently.  He has not had anything to treat this at home but was given  nitroglycerin in the emergency department without benefit.  Morphine was  helpful.   Risk factors include hypertension and extensive smoking history.  He has not  had diabetes or sugar problems.  He is unaware of his lipid status.   His only medications are Dyazide, Prevacid, and aspirin.   He has no known allergies.   SOCIAL HISTORY:  Lives in [place]; married with one son.  Eighty pack-  year history of cigarette smoking that continued  until this morning.  Denies  excessive use of alcohol.  Employed as a Visual merchandiser.   FAMILY HISTORY:  Mother is alive at age 18 and may have some heart problems.  Father suffered a CVA at age 63.  He has  four siblings, none of whom have  coronary disease.   REVIEW OF SYSTEMS:  Generally, feels cold but experiences intermittent  diaphoresis.  Has chronic cough and wheezing and sputum production.  Intermittent mild edema.  Remote history of peptic ulcer disease.  All other  systems reviewed and are negative.   PHYSICAL EXAMINATION:  GENERAL:  A thin gentleman with COPD habitus, in no  acute distress.  VITAL SIGNS:  Temperature is 97.9, heart rate is 75 and regular,  respirations 16 and unlabored, blood pressure initially 145/90, subsequently  105/70.  HEENT:  Normal lids and conjunctivae.  Anicteric sclerae.  NECK:  No jugular  venous distention.  No carotid bruits.  ENDOCRINE:  No thyromegaly.  HEMATOPOIETIC:  No axillary, cervical, or inguinal adenopathy.  SKIN:  No significant lesions.  LUNGS:  Increased AP diameter.  Inspiratory and expiratory wheezes and  rhonchi with prolonged expiratory phase.  CARDIAC:  Normal first and second heart sounds.  Fourth heart sound present.  ABDOMEN:  Soft and nontender.  No bruits.  No masses.  No organomegaly.  EXTREMITIES:  Distal pulses intact.  No edema.  NEUROMUSCULAR:  Symmetrical strength and tone.  MUSCULOSKELETAL:  No joint deformities.   Chest x-ray:  COPD, left basilar atelectasis versus fibrosis.  EKG:  Normal  sinus rhythm, within normal limits.   Initial laboratory unremarkable except for potassium of 3.4.  Point of care  markers are negative.   IMPRESSION:  Patrick Montoya presents with a history of chronic obstructive  pulmonary disease and an exacerbation of chronic obstructive pulmonary  disease.  In this setting, it is difficult to determine whether his chest  discomfort is related to his lung disease or represents superimposed  cardiac  disease.  For now, we will proceed to optimally treat his pulmonary  problems.  He probably would benefit from pulmonary evaluation.  This need  not necessarily be performed on an inpatient basis.  If symptoms resolve  completely with improvement of his respiratory status, a stress Cardiolite  study will probably be adequate to rule out coronary disease.  If symptoms  persist, coronary angiography will probably be necessary.   Lipids will be assessed with treatment as required.   A potassium supplement will be added to his medical regimen.  Blood pressure  control will be reassessed and additional medication given if needed.   The patient claims he is ready to stop smoking cigarettes.  This certainly  would be helpful.                                                [place] Bing, M.D.    RR/MEDQ  D:  12/04/2003  T:  12/05/2003  Job:  [id number]

## 2010-09-28 NOTE — Discharge Summary (Signed)
NAME:  Patrick Montoya, Patrick Montoya   MEDICAL RECORD NO.:  [id number]                   PATIENT TYPE:  INP   LOCATION:  3728                                 FACILITY:  [facility]   PHYSICIAN:  Salvadore Farber, M.D. [facility]         DATE OF BIRTH:  1940/08/22   DATE OF ADMISSION:  12/04/2003  DATE OF DISCHARGE:  12/05/2003                                 DISCHARGE SUMMARY   DISCHARGE DIAGNOSES:  1. Admitted with recurrent chest pain/chronic dyspnea.  2. Troponin-I study negative x3.  3. Electrocardiogram nondiagnostic.  4. Left heart catheterization July 25, normal coronary angiography with     ejection fraction of 70%.   SECONDARY DIAGNOSES:  1. History of long term ongoing tobacco habituation.  2. Chronic obstructive pulmonary disease.  3. Chronic chest pain over one year with progression.  4. Hypertension.  5. Unknown lipid status.   OPERATION/PROCEDURE:  December 05, 2003, left heart catheterization by Dr.  Randa Evens.  Studies show that the left main is free of disease.  Left  anterior descending, right coronary artery and left circumflex are all  normal.  Ejection fraction was 70%.   DISCHARGE DISPOSITION:  Patrick Montoya may go home after the obligatory  lying in period after left heart catheterization.  His catheterization site  is soft, nontender, nondistended.  His right lower extremity is perfusing  well.  He is not having any great amount of chest pain at the current time.   DISCHARGE MEDICATIONS:  He goes home on the following medications:  1. Enteric-coated aspirin 325 mg daily.  2. Prevacid 30 mg daily.  3. Albuterol inhaler as before hospitalization.  4. Triamterene/hydrochlorothiazide 37.5/25 daily.  5. Potassium chloride 20 mEq daily.  6. Suggested at this admission prednisone 40 mg twice daily.  This was     prescribed for his COPD.  Recommended that this would be taken up by Dr.     Demaris Callander or Dr. Samule Ohm as an outpatient.  He  also is recommended to     procure Nicotine patches to aid in his smoking cessation.  The dosages     and the time frame have been described to Patrick Montoya before discharge.  7. For pain at the catheterization site, Tylenol 325 mg one to two tabs     every four to six hours as needed for pain.   ACTIVITY:  He was asked not to lift any heavy weights or to strain for the  next two weeks.  He may drive beginning Thursday, July 28.  Patrick Montoya may  shower.   DIET:  Low-sodium, low-cholesterol diet.   FOLLOW UP:  He is to call [phone] if he experiences pain or swelling at the  catheterization site.  He has office visit followup with Dr. Samule Ohm December 30, 2003 at 2:15 p.m..   BRIEF HISTORY:  Patrick Montoya is a 70 year old male who presents to [facility]  for evaluation of chest pain.  He saw Dr. Samule Ohm three months ago  and was scheduled for a stress study.  The patient cancelled secondary to  his work schedule.  The patient states that he has chest tightness most of  the time.  The intensity varies.  Sometimes his pain lasts three to six  hours.  He also complains of chronic dyspnea.  His dyspnea becomes worse  when he has chest pain.  He also has diaphoresis with his chest pain.  He  saw Dr. Demaris Callander Friday, July 22.  Dr. Demaris Callander told him that he had  progressed.  He received nitroglycerin in the ER without improvement.  He  still has  chest pain on the scale of 4/10.  The patient does say that  morphine has helped him.  Since the patient has recurrent chest pain and  multiple risk factors in the setting of lack of compliance following up for  his prior ordered stress study, the patient will be taken to the  catheterization laboratory on July 25 for definitive diagnosis.   HOSPITAL COURSE:  After admission July 24, this 70 year old male with chest  pain which has been intermittent, waxing and waning, for quite sometime. The  patient was ruled out for acute myocardial infarction  with negative troponin-  I studies.  They were in sequential fashion 0.02, 0.02 and 0.01.  He was  scheduled for the catheterization lab.  This procedure was performed on July  25 with the results as dictated above.  He has normal coronary architecture.  Ejection fraction is preserved.  Dr. Andee Lineman had done the study.  The patient  will discharge after suitable lying-in period here at [facility].  He goes  home with the medications and followup as dictated.      Maple Mirza, P.A.                    Salvadore Farber, M.D. [facility]    GM/MEDQ  D:  12/05/2003  T:  12/05/2003  Job:  [id number]   cc:   Salvadore Farber, M.D. [facility]  [address]  [address]  [place]  [place] [address]   Dr. Demaris Callander

## 2010-10-15 ENCOUNTER — Encounter (INDEPENDENT_AMBULATORY_CARE_PROVIDER_SITE_OTHER): Payer: Self-pay | Admitting: Surgery

## 2011-08-06 ENCOUNTER — Ambulatory Visit: Payer: Self-pay | Admitting: Ophthalmology

## 2011-08-06 DIAGNOSIS — I10 Essential (primary) hypertension: Secondary | ICD-10-CM

## 2011-08-06 LAB — POTASSIUM: Potassium: 3.4 mmol/L — ABNORMAL LOW (ref 3.5–5.1)

## 2011-08-20 ENCOUNTER — Ambulatory Visit: Payer: Self-pay | Admitting: Ophthalmology

## 2011-09-10 ENCOUNTER — Ambulatory Visit: Payer: Self-pay | Admitting: Ophthalmology

## 2011-09-10 LAB — POTASSIUM: Potassium: 4.7 mmol/L (ref 3.5–5.1)

## 2011-09-17 ENCOUNTER — Ambulatory Visit: Payer: Self-pay | Admitting: Ophthalmology

## 2012-04-02 ENCOUNTER — Ambulatory Visit: Payer: Self-pay | Admitting: Family Medicine

## 2012-06-09 ENCOUNTER — Ambulatory Visit: Payer: Self-pay | Admitting: Family Medicine

## 2012-08-21 ENCOUNTER — Ambulatory Visit: Payer: Self-pay | Admitting: Family Medicine

## 2012-10-26 ENCOUNTER — Emergency Department: Payer: Self-pay | Admitting: Emergency Medicine

## 2012-10-26 LAB — URINALYSIS, COMPLETE
Bacteria: NONE SEEN
Blood: NEGATIVE
Glucose,UR: NEGATIVE mg/dL (ref 0–75)
Ketone: NEGATIVE
Nitrite: NEGATIVE
Protein: NEGATIVE
RBC,UR: 7 /HPF (ref 0–5)
Specific Gravity: 1.021 (ref 1.003–1.030)
Squamous Epithelial: NONE SEEN
WBC UR: 29 /HPF (ref 0–5)

## 2012-10-26 LAB — COMPREHENSIVE METABOLIC PANEL
Albumin: 4 g/dL (ref 3.4–5.0)
Alkaline Phosphatase: 100 U/L (ref 50–136)
Anion Gap: 6 — ABNORMAL LOW (ref 7–16)
Calcium, Total: 8.8 mg/dL (ref 8.5–10.1)
Chloride: 108 mmol/L — ABNORMAL HIGH (ref 98–107)
Co2: 26 mmol/L (ref 21–32)
EGFR (Non-African Amer.): >60
Potassium: 3.4 mmol/L — ABNORMAL LOW (ref 3.5–5.1)
Sodium: 140 mmol/L (ref 136–145)

## 2012-10-26 LAB — CBC
MCH: 34.5 pg — ABNORMAL HIGH (ref 26.0–34.0)
MCHC: 34.2 g/dL (ref 32.0–36.0)
RBC: 4.25 10*6/uL — ABNORMAL LOW (ref 4.40–5.90)
RDW: 13.6 % (ref 11.5–14.5)

## 2012-11-06 ENCOUNTER — Ambulatory Visit: Payer: Self-pay | Admitting: Family Medicine

## 2012-11-17 ENCOUNTER — Ambulatory Visit: Payer: Self-pay | Admitting: Family Medicine

## 2013-03-26 ENCOUNTER — Ambulatory Visit: Payer: Self-pay | Admitting: Family Medicine

## 2013-06-04 ENCOUNTER — Emergency Department: Payer: Self-pay | Admitting: Emergency Medicine

## 2013-10-15 ENCOUNTER — Ambulatory Visit: Payer: Self-pay | Admitting: Family Medicine

## 2014-01-21 DIAGNOSIS — J440 Chronic obstructive pulmonary disease with acute lower respiratory infection: Secondary | ICD-10-CM

## 2014-01-21 DIAGNOSIS — J209 Acute bronchitis, unspecified: Secondary | ICD-10-CM | POA: Insufficient documentation

## 2014-01-21 DIAGNOSIS — N400 Enlarged prostate without lower urinary tract symptoms: Secondary | ICD-10-CM | POA: Insufficient documentation

## 2014-01-21 DIAGNOSIS — I10 Essential (primary) hypertension: Secondary | ICD-10-CM | POA: Insufficient documentation

## 2014-04-11 DIAGNOSIS — G4700 Insomnia, unspecified: Secondary | ICD-10-CM | POA: Insufficient documentation

## 2014-04-11 DIAGNOSIS — J449 Chronic obstructive pulmonary disease, unspecified: Secondary | ICD-10-CM | POA: Insufficient documentation

## 2014-04-11 DIAGNOSIS — J441 Chronic obstructive pulmonary disease with (acute) exacerbation: Secondary | ICD-10-CM | POA: Insufficient documentation

## 2014-09-04 NOTE — Op Note (Signed)
PATIENT NAME:  Patrick Montoya, BELLANCA MR#:  [id number] DATE OF BIRTH:  09/06/1940  DATE OF PROCEDURE:  08/20/2011  PREOPERATIVE DIAGNOSIS: Visually significant cataract of the left eye.   POSTOPERATIVE DIAGNOSIS: Visually significant cataract of the left eye.   OPERATIVE PROCEDURE: Cataract extraction by phacoemulsification with implant of intraocular lens to left eye.   SURGEON: Birder Robson, MD.   ANESTHESIA:  1. Managed anesthesia care.  2. Topical tetracaine drops followed by 2% Xylocaine jelly applied in the preoperative holding area.   COMPLICATIONS: None.   TECHNIQUE:  Four quadrant divide-and-conquer.   DESCRIPTION OF PROCEDURE: The patient was examined and consented in the preoperative holding area where the aforementioned topical anesthesia was applied to the left eye and then brought back to the Operating Room where the left eye was prepped and draped in the usual sterile ophthalmic fashion and a lid speculum was placed. A paracentesis was created with the side port blade and the anterior chamber was filled with viscoelastic. A near clear corneal incision was performed with the steel keratome. A continuous curvilinear capsulorrhexis was performed with a cystotome followed by the capsulorrhexis forceps. Hydrodissection and hydrodelineation were carried out with BSS on a blunt cannula. The lens was removed in a four quadrant divide-and-conquer technique and the remaining cortical material was removed with the irrigation-aspiration handpiece. The capsular bag was inflated with viscoelastic and the Technis ZCB00 23.5-diopter lens, serial number [id number] was placed in the capsular bag without complication. The remaining viscoelastic was removed from the eye with the irrigation-aspiration handpiece. The wounds were hydrated. The anterior chamber was flushed with Miostat and the eye was inflated to physiologic pressure. The wounds were found to be water tight. The eye was dressed with Vigamox.  The patient was given protective glasses to wear throughout the day and a shield with which to sleep tonight. The patient was also given drops with which to begin a drop regimen today and will follow-up with me in one day.   ____________________________ Livingston Diones. Lachae Hohler, MD wlp:cms D: 08/20/2011 12:52:40 ET T: 08/20/2011 13:15:18 ET JOB#: [id number]  cc: [place] Warriner L. Kagen Kunath, MD, <Dictator>  Livingston Diones Kahleel Fadeley MD ELECTRONICALLY SIGNED 08/21/2011 11:41

## 2014-09-04 NOTE — Op Note (Signed)
PATIENT NAME:  Patrick Montoya, Patrick Montoya MR#:  [id number] DATE OF BIRTH:  17-Nov-1940  DATE OF PROCEDURE:  09/17/2011  PREOPERATIVE DIAGNOSIS: Visually significant cataract of the right eye.   POSTOPERATIVE DIAGNOSIS: Visually significant cataract of the right eye.   OPERATIVE PROCEDURE: Cataract extraction by phacoemulsification with implant of intraocular lens to right eye.   SURGEON: Birder Robson, MD.   ANESTHESIA:  1. Managed anesthesia care.  2. Topical tetracaine drops followed by 2% Xylocaine jelly applied in the preoperative holding area.   COMPLICATIONS: None.   TECHNIQUE:  Stop-and-chop    DESCRIPTION OF PROCEDURE: The patient was examined and consented in the preoperative holding area where the aforementioned topical anesthesia was applied to the right eye and then brought back to the Operating Room where the right eye was prepped and draped in the usual sterile ophthalmic fashion and a lid speculum was placed. A paracentesis was created with the side port blade and the anterior chamber was filled with viscoelastic. A near clear corneal incision was performed with the steel keratome. A continuous curvilinear capsulorrhexis was performed with a cystotome followed by the capsulorrhexis forceps. Hydrodissection and hydrodelineation were carried out with BSS on a blunt cannula. The lens was removed in a stop-and-chop technique and the remaining cortical material was removed with the irrigation-aspiration handpiece. The capsular bag was inflated with viscoelastic and the Technus ZCB00 23.5-diopter lens, serial number [id number] was placed in the capsular bag without complication. The remaining viscoelastic was removed from the eye with the irrigation-aspiration handpiece. The wounds were hydrated. The anterior chamber was flushed with Miostat and the eye was inflated to physiologic pressure. The wounds were found to be water tight. The eye was dressed with Vigamox. The patient was given protective  glasses to wear throughout the day and a shield with which to sleep tonight. The patient was also given drops with which to begin a drop regimen today and will follow-up with me in one day.   ____________________________ Livingston Diones. Shannen Vernon, MD wlp:drc D: 09/17/2011 13:20:54 ET T: 09/17/2011 13:32:44 ET JOB#: [id number]  cc: Scheryl Sanborn L. Galaxy Borden, MD, <Dictator> Livingston Diones Angie Piercey MD ELECTRONICALLY SIGNED 09/19/2011 17:26

## 2014-09-07 ENCOUNTER — Emergency Department
Admit: 2014-09-07 | Disposition: A | Discharge status: Home / Self Care | Payer: Self-pay | Admitting: Emergency Medicine

## 2015-03-31 ENCOUNTER — Other Ambulatory Visit: Payer: Self-pay | Admitting: Specialist

## 2015-03-31 DIAGNOSIS — R9389 Abnormal findings on diagnostic imaging of other specified body structures: Secondary | ICD-10-CM

## 2015-03-31 DIAGNOSIS — R911 Solitary pulmonary nodule: Secondary | ICD-10-CM

## 2015-04-11 ENCOUNTER — Ambulatory Visit
Admission: RE | Admit: 2015-04-11 | Discharge: 2015-04-11 | Disposition: A | Discharge status: Home / Self Care | Payer: Medicare Other | Source: Ambulatory Visit | Attending: Specialist | Admitting: Specialist

## 2015-04-11 DIAGNOSIS — R911 Solitary pulmonary nodule: Secondary | ICD-10-CM | POA: Diagnosis not present

## 2015-04-11 DIAGNOSIS — J432 Centrilobular emphysema: Secondary | ICD-10-CM | POA: Insufficient documentation

## 2015-04-11 DIAGNOSIS — R9389 Abnormal findings on diagnostic imaging of other specified body structures: Secondary | ICD-10-CM

## 2015-05-26 DIAGNOSIS — K219 Gastro-esophageal reflux disease without esophagitis: Secondary | ICD-10-CM | POA: Insufficient documentation

## 2015-05-28 DIAGNOSIS — J42 Unspecified chronic bronchitis: Secondary | ICD-10-CM | POA: Insufficient documentation

## 2015-07-25 DIAGNOSIS — C4491 Basal cell carcinoma of skin, unspecified: Secondary | ICD-10-CM

## 2015-07-25 HISTORY — DX: Basal cell carcinoma of skin, unspecified: C44.91

## 2015-10-19 ENCOUNTER — Encounter: Payer: Self-pay | Admitting: *Deleted

## 2015-10-20 ENCOUNTER — Ambulatory Visit: Payer: Medicare Other | Admitting: Anesthesiology

## 2015-10-20 ENCOUNTER — Emergency Department
Admission: EM | Admit: 2015-10-20 | Discharge: 2015-10-20 | Disposition: A | Discharge status: Home / Self Care | Payer: Medicare Other | Source: Home / Self Care | Attending: Emergency Medicine | Admitting: Emergency Medicine

## 2015-10-20 ENCOUNTER — Encounter
Admission: RE | Disposition: A | Discharge status: Home / Self Care | Payer: Self-pay | Source: Ambulatory Visit | Attending: Unknown Physician Specialty

## 2015-10-20 ENCOUNTER — Encounter: Payer: Self-pay | Admitting: Anesthesiology

## 2015-10-20 ENCOUNTER — Ambulatory Visit
Admission: RE | Admit: 2015-10-20 | Discharge: 2015-10-20 | Disposition: A | Discharge status: Home / Self Care | Payer: Medicare Other | Source: Ambulatory Visit | Attending: Unknown Physician Specialty | Admitting: Unknown Physician Specialty

## 2015-10-20 ENCOUNTER — Encounter: Payer: Self-pay | Admitting: *Deleted

## 2015-10-20 DIAGNOSIS — D122 Benign neoplasm of ascending colon: Secondary | ICD-10-CM | POA: Diagnosis not present

## 2015-10-20 DIAGNOSIS — Z86010 Personal history of colonic polyps: Secondary | ICD-10-CM | POA: Insufficient documentation

## 2015-10-20 DIAGNOSIS — Z1211 Encounter for screening for malignant neoplasm of colon: Secondary | ICD-10-CM | POA: Insufficient documentation

## 2015-10-20 DIAGNOSIS — N390 Urinary tract infection, site not specified: Secondary | ICD-10-CM

## 2015-10-20 DIAGNOSIS — Z7982 Long term (current) use of aspirin: Secondary | ICD-10-CM | POA: Insufficient documentation

## 2015-10-20 DIAGNOSIS — D123 Benign neoplasm of transverse colon: Secondary | ICD-10-CM | POA: Insufficient documentation

## 2015-10-20 DIAGNOSIS — Z79899 Other long term (current) drug therapy: Secondary | ICD-10-CM

## 2015-10-20 DIAGNOSIS — K621 Rectal polyp: Secondary | ICD-10-CM | POA: Insufficient documentation

## 2015-10-20 DIAGNOSIS — N400 Enlarged prostate without lower urinary tract symptoms: Secondary | ICD-10-CM | POA: Insufficient documentation

## 2015-10-20 DIAGNOSIS — Z800 Family history of malignant neoplasm of digestive organs: Secondary | ICD-10-CM | POA: Insufficient documentation

## 2015-10-20 DIAGNOSIS — K648 Other hemorrhoids: Secondary | ICD-10-CM | POA: Diagnosis not present

## 2015-10-20 DIAGNOSIS — F17210 Nicotine dependence, cigarettes, uncomplicated: Secondary | ICD-10-CM | POA: Insufficient documentation

## 2015-10-20 DIAGNOSIS — I10 Essential (primary) hypertension: Secondary | ICD-10-CM

## 2015-10-20 DIAGNOSIS — Z9049 Acquired absence of other specified parts of digestive tract: Secondary | ICD-10-CM | POA: Insufficient documentation

## 2015-10-20 DIAGNOSIS — R339 Retention of urine, unspecified: Secondary | ICD-10-CM

## 2015-10-20 DIAGNOSIS — K219 Gastro-esophageal reflux disease without esophagitis: Secondary | ICD-10-CM | POA: Diagnosis present

## 2015-10-20 DIAGNOSIS — K2950 Unspecified chronic gastritis without bleeding: Secondary | ICD-10-CM | POA: Diagnosis not present

## 2015-10-20 DIAGNOSIS — D125 Benign neoplasm of sigmoid colon: Secondary | ICD-10-CM | POA: Insufficient documentation

## 2015-10-20 DIAGNOSIS — Z8719 Personal history of other diseases of the digestive system: Secondary | ICD-10-CM | POA: Diagnosis not present

## 2015-10-20 DIAGNOSIS — J449 Chronic obstructive pulmonary disease, unspecified: Secondary | ICD-10-CM | POA: Diagnosis not present

## 2015-10-20 DIAGNOSIS — D124 Benign neoplasm of descending colon: Secondary | ICD-10-CM | POA: Insufficient documentation

## 2015-10-20 HISTORY — DX: Gastric ulcer, unspecified as acute or chronic, without hemorrhage or perforation: K25.9

## 2015-10-20 HISTORY — DX: Benign prostatic hyperplasia without lower urinary tract symptoms: N40.0

## 2015-10-20 HISTORY — DX: Chronic obstructive pulmonary disease, unspecified: J44.9

## 2015-10-20 HISTORY — PX: ESOPHAGOGASTRODUODENOSCOPY (EGD) WITH PROPOFOL: SHX5813

## 2015-10-20 HISTORY — DX: Gastro-esophageal reflux disease without esophagitis: K21.9

## 2015-10-20 HISTORY — DX: Unspecified rotator cuff tear or rupture of unspecified shoulder, not specified as traumatic: M75.100

## 2015-10-20 HISTORY — DX: Essential (primary) hypertension: I10

## 2015-10-20 HISTORY — PX: COLONOSCOPY WITH PROPOFOL: SHX5780

## 2015-10-20 LAB — URINALYSIS COMPLETE WITH MICROSCOPIC (ARMC ONLY)
BILIRUBIN URINE: NEGATIVE
GLUCOSE, UA: NEGATIVE mg/dL
KETONES UR: NEGATIVE mg/dL
NITRITE: POSITIVE — AB
PH: 7 (ref 5.0–8.0)
Protein, ur: 30 mg/dL — AB
Specific Gravity, Urine: 1.009 (ref 1.005–1.030)
Squamous Epithelial / LPF: NONE SEEN

## 2015-10-20 LAB — BASIC METABOLIC PANEL
ANION GAP: 12 (ref 5–15)
BUN: 10 mg/dL (ref 6–20)
CHLORIDE: 103 mmol/L (ref 101–111)
CO2: 25 mmol/L (ref 22–32)
Calcium: 8.8 mg/dL — ABNORMAL LOW (ref 8.9–10.3)
Creatinine, Ser: 0.94 mg/dL (ref 0.61–1.24)
Glucose, Bld: 86 mg/dL (ref 65–99)
POTASSIUM: 3.1 mmol/L — AB (ref 3.5–5.1)
SODIUM: 140 mmol/L (ref 135–145)

## 2015-10-20 SURGERY — COLONOSCOPY WITH PROPOFOL
Anesthesia: General

## 2015-10-20 MED ORDER — IPRATROPIUM-ALBUTEROL 0.5-2.5 (3) MG/3ML IN SOLN
3.0000 mL | Freq: Once | RESPIRATORY_TRACT | Status: AC
  Administered 2015-10-20: 3 mL via RESPIRATORY_TRACT
  Filled 2015-10-20: qty 3

## 2015-10-20 MED ORDER — SODIUM CHLORIDE 0.9 % IV SOLN
INTRAVENOUS | Status: DC
  Administered 2015-10-20 (×2): 1000 mL via INTRAVENOUS

## 2015-10-20 MED ORDER — MIDAZOLAM HCL 5 MG/5ML IJ SOLN
INTRAMUSCULAR | Status: DC | PRN
  Administered 2015-10-20: 1 mg via INTRAVENOUS

## 2015-10-20 MED ORDER — SODIUM CHLORIDE 0.9 % IV SOLN: INTRAVENOUS | Status: DC

## 2015-10-20 MED ORDER — LIDOCAINE HCL (PF) 2 % IJ SOLN
INTRAMUSCULAR | Status: DC | PRN
  Administered 2015-10-20: 50 mg

## 2015-10-20 MED ORDER — DEXTROSE 5 % IV SOLN
1.0000 g | Freq: Once | INTRAVENOUS | Status: AC
  Administered 2015-10-20: 1 g via INTRAVENOUS
  Filled 2015-10-20: qty 10

## 2015-10-20 MED ORDER — FENTANYL CITRATE (PF) 100 MCG/2ML IJ SOLN
INTRAMUSCULAR | Status: DC | PRN
  Administered 2015-10-20: 50 ug via INTRAVENOUS

## 2015-10-20 MED ORDER — GLYCOPYRROLATE 0.2 MG/ML IJ SOLN
INTRAMUSCULAR | Status: DC | PRN
Start: 2015-10-20 — End: 2015-10-20
  Administered 2015-10-20: 0.2 mg via INTRAVENOUS

## 2015-10-20 MED ORDER — PROPOFOL 10 MG/ML IV BOLUS
INTRAVENOUS | Status: DC | PRN
  Administered 2015-10-20: 20 mg via INTRAVENOUS
  Administered 2015-10-20: 30 mg via INTRAVENOUS
  Administered 2015-10-20: 20 mg via INTRAVENOUS

## 2015-10-20 MED ORDER — CIPROFLOXACIN HCL 500 MG PO TABS: 500.0000 mg | ORAL_TABLET | Freq: Two times a day (BID) | ORAL | Status: AC

## 2015-10-20 MED ORDER — PROPOFOL 500 MG/50ML IV EMUL
INTRAVENOUS | Status: DC | PRN
  Administered 2015-10-20: 50 ug/kg/min via INTRAVENOUS

## 2015-10-20 NOTE — H&P (Signed)
Primary Care Physician:  Glendon Axe, MD Primary Gastroenterologist:  Dr. Vira Agar  Pre-Procedure History & Physical: HPI:  Patrick Montoya is a 75 y.o. male is here for an endoscopy and colonoscopy.   Past Medical History  Diagnosis Date  . COPD (chronic obstructive pulmonary disease) ([place])   . GERD (gastroesophageal reflux disease)   . Hypertension   . Gastric ulcer   . Prostate hypertrophy   . Rotator cuff tear right    Past Surgical History  Procedure Laterality Date  . Tonsillectomy    . Cholecystectomy      Prior to Admission medications   Medication Sig Start Date End Date Taking? Authorizing Provider  ACETAMINOPHEN-GUAIFENESIN PO Take 1 tablet by mouth daily.   Yes Historical Provider, MD  albuterol (PROVENTIL HFA;VENTOLIN HFA) 108 (90 Base) MCG/ACT inhaler Inhale 2 puffs into the lungs every 4 (four) hours as needed for wheezing or shortness of breath.   Yes Historical Provider, MD  albuterol (PROVENTIL) (2.5 MG/3ML) 0.083% nebulizer solution Take 2.5 mg by nebulization every 6 (six) hours as needed for wheezing or shortness of breath.   Yes Historical Provider, MD  ALPRAZolam Duanne Moron) 0.5 MG tablet Take 0.5 mg by mouth at bedtime as needed for anxiety.   Yes Historical Provider, MD  aspirin 81 MG tablet Take 81 mg by mouth daily.     Yes Historical Provider, MD  doxazosin (CARDURA) 4 MG tablet Take 4 mg by mouth daily.   Yes Historical Provider, MD  OMEPRAZOLE PO Take by mouth.     Yes Historical Provider, MD  Potassium (POTASSIMIN PO) Take by mouth.     Yes Historical Provider, MD  tiotropium (SPIRIVA) 18 MCG inhalation capsule Place 18 mcg into inhaler and inhale daily.   Yes Historical Provider, MD  triamterene-hydrochlorothiazide (MAXZIDE-25) 37.5-25 MG tablet Take 1 tablet by mouth daily.   Yes Historical Provider, MD    Allergies as of 10/06/2015  . (No Known Allergies)    History reviewed. No pertinent family history.  Social History   Social History   . Marital Status: Married    Spouse Name: N/A  . Number of Children: N/A  . Years of Education: N/A   Occupational History  . Not on file.   Social History Main Topics  . Smoking status: Current Every Day Smoker -- 1.00 packs/day    Types: Cigarettes  . Smokeless tobacco: Never Used  . Alcohol Use: 7.2 oz/week    12 Cans of beer per week  . Drug Use: No  . Sexual Activity: Not on file   Other Topics Concern  . Not on file   Social History Narrative    Review of Systems: See HPI, otherwise negative ROS  Physical Exam: BP 124/75 mmHg  Pulse 84  Temp(Src) 97.2 F (36.2 C) (Tympanic)  Resp 19  Ht 5\' 10"  (1.778 m)  Wt 74.844 kg (165 lb)  BMI 23.68 kg/m2  SpO2 96% General:   Alert,  pleasant and cooperative in NAD Head:  Normocephalic and atraumatic. Neck:  Supple; no masses or thyromegaly. Lungs:  Clear throughout to auscultation. Global decreased breath sounds   Heart:  Regular rate and rhythm. Abdomen:  Soft, nontender and nondistended. Normal bowel sounds, without guarding, and without rebound.   Neurologic:  Alert and  oriented x4;  grossly normal neurologically.  Impression/Plan: Patrick Montoya is here for an endoscopy and colonoscopy to be performed for FH colon cancer and GERD  Risks, benefits, limitations, and alternatives regarding  endoscopy and colonoscopy have been reviewed with the patient.  Questions have been answered.  All parties agreeable.   Gaylyn Cheers, MD  10/20/2015, 10:01 AM

## 2015-10-20 NOTE — ED Notes (Addendum)
Pt ambulatory to stat desk and taken to room 2 via wheelchair.  Pt had endo this morning and has not voided since 1000am.

## 2015-10-20 NOTE — Discharge Instructions (Signed)
Acute Urinary Retention, Male Acute urinary retention is the temporary inability to urinate. This is a common problem in older men. As men age their prostates become larger and block the flow of urine from the bladder. This is usually a problem that has come on gradually.  HOME CARE INSTRUCTIONS If you are sent home with a Foley catheter and a drainage system, you will need to discuss the best course of action with your health care provider. While the catheter is in, maintain a good intake of fluids. Keep the drainage bag emptied and lower than your catheter. This is so that contaminated urine will not flow back into your bladder, which could lead to a urinary tract infection. There are two main types of drainage bags. One is a large bag that usually is used at night. It has a good capacity that will allow you to sleep through the night without having to empty it. The second type is called a leg bag. It has a smaller capacity, so it needs to be emptied more frequently. However, the main advantage is that it can be attached by a leg strap and can go underneath your clothing, allowing you the freedom to move about or leave your home. Only take over-the-counter or prescription medicines for pain, discomfort, or fever as directed by your health care provider.  SEEK MEDICAL CARE IF:  You develop a low-grade fever.  You experience spasms or leakage of urine with the spasms. SEEK IMMEDIATE MEDICAL CARE IF:   You develop chills or fever.  Your catheter stops draining urine.  Your catheter falls out.  You start to develop increased bleeding that does not respond to rest and increased fluid intake. MAKE SURE YOU:  Understand these instructions.  Will watch your condition.  Will get help right away if you are not doing well or get worse.   This information is not intended to replace advice given to you by your health care provider. Make sure you discuss any questions you have with your health care  provider.   Document Released: 08/05/2000 Document Revised: 09/13/2014 Document Reviewed: 10/08/2012 Elsevier Interactive Patient Education 2016 Elsevier Inc.  Urinary Tract Infection A urinary tract infection (UTI) can occur any place along the urinary tract. The tract includes the kidneys, ureters, bladder, and urethra. A type of germ called bacteria often causes a UTI. UTIs are often helped with antibiotic medicine.  HOME CARE   If given, take antibiotics as told by your doctor. Finish them even if you start to feel better.  Drink enough fluids to keep your pee (urine) clear or pale yellow.  Avoid tea, drinks with caffeine, and bubbly (carbonated) drinks.  Pee often. Avoid holding your pee in for a long time.  Pee before and after having sex (intercourse).  Wipe from front to back after you poop (bowel movement) if you are a woman. Use each tissue only once. GET HELP RIGHT AWAY IF:   You have back pain.  You have lower belly (abdominal) pain.  You have chills.  You feel sick to your stomach (nauseous).  You throw up (vomit).  Your burning or discomfort with peeing does not go away.  You have a fever.  Your symptoms are not better in 3 days. MAKE SURE YOU:   Understand these instructions.  Will watch your condition.  Will get help right away if you are not doing well or get worse.   This information is not intended to replace advice given to you by  your health care provider. Make sure you discuss any questions you have with your health care provider.   Document Released: 10/16/2007 Document Revised: 05/20/2014 Document Reviewed: 11/28/2011 Elsevier Interactive Patient Education [employer].

## 2015-10-20 NOTE — Op Note (Signed)
[facility] Gastroenterology Patient Name: Patrick Montoya Procedure Date: 10/20/2015 10:05 AM MRN: [id number] Account #: [id number] Date of Birth: 1941-03-21 Admit Type: Outpatient Age: 75 Room: [facility] ENDO ROOM 4 Gender: Male Note Status: Finalized Procedure:            Colonoscopy Indications:          High risk colon cancer surveillance: Personal history                        of colonic polyps Providers:            Manya Silvas, MD Referring MD:         Glendon Axe (Referring MD) Medicines:            Propofol per Anesthesia Complications:        No immediate complications. Procedure:            Pre-Anesthesia Assessment:                       - After reviewing the risks and benefits, the patient                        was deemed in satisfactory condition to undergo the                        procedure.                       After obtaining informed consent, the colonoscope was                        passed under direct vision. Throughout the procedure,                        the patient's blood pressure, pulse, and oxygen                        saturations were monitored continuously. The                        Colonoscope was introduced through the anus and                        advanced to the the cecum, identified by appendiceal                        orifice and ileocecal valve. The colonoscopy was                        somewhat difficult due to significant looping.                        Successful completion of the procedure was aided by                        applying abdominal pressure. Findings:      Two sessile polyps were found in the transverse colon. The polyps were       diminutive in size. These polyps were removed with a jumbo cold forceps.       Resection and retrieval were complete.      Two sessile polyps were  found in the ascending colon. The polyps were       diminutive in size. These polyps were removed with a jumbo cold forceps.        Resection and retrieval were complete.      A small polyp was found in the transverse colon. The polyp was sessile.       The polyp was removed with a hot snare. Resection and retrieval were       complete.      A diminutive polyp was found in the descending colon. The polyp was       sessile. The polyp was removed with a jumbo cold forceps. Resection and       retrieval were complete.      A diminutive polyp was found in the sigmoid colon. The polyp was       sessile. The polyp was removed with a jumbo cold forceps. Resection and       retrieval were complete.      A diminutive polyp was found in the rectum. The polyp was sessile. The       polyp was removed with a jumbo cold forceps. Resection and retrieval       were complete. Impression:           - Two diminutive polyps in the transverse colon,                        removed with a jumbo cold forceps. Resected and                        retrieved.                       - Two diminutive polyps in the ascending colon, removed                        with a jumbo cold forceps. Resected and retrieved.                       - One small polyp in the transverse colon, removed with                        a hot snare. Resected and retrieved.                       - One diminutive polyp in the descending colon, removed                        with a jumbo cold forceps. Resected and retrieved.                       - One diminutive polyp in the sigmoid colon, removed                        with a jumbo cold forceps. Resected and retrieved.                       - One diminutive polyp in the rectum, removed with a                        jumbo cold forceps. Resected and retrieved. Recommendation:       -  Await pathology results. Manya Silvas, MD 10/20/2015 10:53:20 AM This report has been signed electronically. Number of Addenda: 0 Note Initiated On: 10/20/2015 10:05 AM Scope Withdrawal Time: 0 hours 20 minutes 18 seconds  Total  Procedure Duration: 0 hours 31 minutes 0 seconds       [facility]

## 2015-10-20 NOTE — Anesthesia Preprocedure Evaluation (Signed)
Anesthesia Evaluation  Patient identified by MRN, date of birth, ID band Patient awake    Reviewed: Allergy & Precautions, H&P , NPO status , Patient's Chart, lab work & pertinent test results  History of Anesthesia Complications Negative for: history of anesthetic complications  Airway Mallampati: III  TM Distance: <3 FB Neck ROM: limited    Dental  (+) Poor Dentition, Missing   Pulmonary shortness of breath and with exertion, COPD,  COPD inhaler, Current Smoker,    Pulmonary exam normal breath sounds clear to auscultation       Cardiovascular Exercise Tolerance: Good hypertension, (-) angina+ DOE  (-) Past MI Normal cardiovascular exam Rhythm:regular Rate:Normal     Neuro/Psych negative neurological ROS  negative psych ROS   GI/Hepatic Neg liver ROS, PUD, GERD  Controlled,  Endo/Other  negative endocrine ROS  Renal/GU Renal disease  negative genitourinary   Musculoskeletal  (+) Arthritis ,   Abdominal   Peds  Hematology negative hematology ROS (+)   Anesthesia Other Findings Past Medical History:   COPD (chronic obstructive pulmonary disease) (*              GERD (gastroesophageal reflux disease)                       Hypertension                                                 Gastric ulcer                                                Prostate hypertrophy                                         Rotator cuff tear                               right       Past Surgical History:   TONSILLECTOMY                                                 CHOLECYSTECTOMY                                              BMI    Body Mass Index   23.67 kg/m 2      Reproductive/Obstetrics negative OB ROS                             Anesthesia Physical Anesthesia Plan  ASA: III  Anesthesia Plan: General   Post-op Pain Management:    Induction:   Airway Management Planned:   Additional  Equipment:   Intra-op Plan:   Post-operative Plan:   Informed Consent: I have reviewed the patients  History and Physical, chart, labs and discussed the procedure including the risks, benefits and alternatives for the proposed anesthesia with the patient or authorized representative who has indicated his/her understanding and acceptance.   Dental Advisory Given  Plan Discussed with: Anesthesiologist, CRNA and Surgeon  Anesthesia Plan Comments:         Anesthesia Quick Evaluation

## 2015-10-20 NOTE — ED Provider Notes (Signed)
[facility] Emergency Department Provider Note  ____________________________________________    I have reviewed the triage vital signs and the nursing notes.   HISTORY  Chief Complaint Urinary Retention    HPI Patrick Montoya is a 75 y.o. male who presents with inability to urinate. Patient had an endoscopy this morning, received propofol per records and has not been able to urinate since 10 AM. Patient complains of lower superpubic pain and cramping. Does have a history of an enlarged prostate, no nausea or vomiting. No diarrhea. No fevers or chills. No history of urinary retention in the past     Past Medical History  Diagnosis Date  . COPD (chronic obstructive pulmonary disease) ([place])   . GERD (gastroesophageal reflux disease)   . Hypertension   . Gastric ulcer   . Prostate hypertrophy   . Rotator cuff tear right    Patient Active Problem List   Diagnosis Date Noted  . SINUSITIS- ACUTE-NOS 07/03/2007  . RENAL CALCULUS 06/05/2007  . HYPERTENSION 02/27/2007  . COPD 02/27/2007  . GERD 02/27/2007  . PEPTIC ULCER DISEASE 02/27/2007  . BENIGN PROSTATIC HYPERTROPHY 02/27/2007  . BURSITIS, RIGHT SHOULDER 02/27/2007  . COLONIC POLYPS, HX OF 02/27/2007  . 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 Tonsillectomy    . Cholecystectomy    . Colonoscopy with propofol N/A 10/20/2015    Procedure: COLONOSCOPY WITH PROPOFOL;  Surgeon: Manya Silvas, MD;  Location: [facility] ENDOSCOPY;  Service: Endoscopy;  Laterality: N/A;  . Esophagogastroduodenoscopy (egd) with propofol N/A 10/20/2015    Procedure: ESOPHAGOGASTRODUODENOSCOPY (EGD) WITH PROPOFOL;  Surgeon: Manya Silvas, MD;  Location: [facility] ENDOSCOPY;  Service: Endoscopy;  Laterality: N/A;    Current Outpatient Rx  Name  Route  Sig  Dispense  Refill  . ACETAMINOPHEN-GUAIFENESIN PO   Oral   Take 1 tablet by mouth daily.         Marland Kitchen albuterol (PROVENTIL  HFA;VENTOLIN HFA) 108 (90 Base) MCG/ACT inhaler   Inhalation   Inhale 2 puffs into the lungs every 4 (four) hours as needed for wheezing or shortness of breath.         Marland Kitchen albuterol (PROVENTIL) (2.5 MG/3ML) 0.083% nebulizer solution   Nebulization   Take 2.5 mg by nebulization every 6 (six) hours as needed for wheezing or shortness of breath.         . ALPRAZolam (XANAX) 0.5 MG tablet   Oral   Take 0.5 mg by mouth at bedtime as needed for anxiety.         Marland Kitchen aspirin 81 MG tablet   Oral   Take 81 mg by mouth daily.           Marland Kitchen doxazosin (CARDURA) 4 MG tablet   Oral   Take 4 mg by mouth daily.         Marland Kitchen OMEPRAZOLE PO   Oral   Take by mouth.           . Potassium (POTASSIMIN PO)   Oral   Take by mouth.           . tiotropium (SPIRIVA) 18 MCG inhalation capsule   Inhalation   Place 18 mcg into inhaler and inhale daily.         Marland Kitchen triamterene-hydrochlorothiazide (MAXZIDE-25) 37.5-25 MG tablet   Oral   Take 1 tablet by mouth daily.           Allergies Review of patient's allergies  indicates no known allergies.  No family history on file.  Social History Social History  Substance Use Topics  . Smoking status: Current Every Day Smoker -- 1.00 packs/day    Types: Cigarettes  . Smokeless tobacco: Never Used  . Alcohol Use: 7.2 oz/week    12 Cans of beer per week    Review of Systems  Constitutional: Negative for fever. Eyes: Negative for redness ENT: Negative for sore throat Cardiovascular: Negative for chest pain Respiratory: Negative for shortness of breath. Gastrointestinal: As above Genitourinary: Positive for urinary retention Musculoskeletal: Negative for back pain. Skin: Negative for rash. Neurological: Negative for focal weakness Psychiatric: no anxiety    ____________________________________________   PHYSICAL EXAM:  VITAL SIGNS: ED Triage Vitals  Enc Vitals Group     BP 10/20/15 1836 171/112 mmHg     Pulse Rate 10/20/15 1836  98     Resp 10/20/15 1836 20     Temp 10/20/15 1836 97.6 F (36.4 C)     Temp Source 10/20/15 1836 Oral     SpO2 10/20/15 1836 97 %     Weight 10/20/15 1836 165 lb (74.844 kg)     Height 10/20/15 1836 5\' 10"  (1.778 m)     Head Cir --      Peak Flow --      Pain Score 10/20/15 1838 9     Pain Loc --      Pain Edu? --      Excl. in [place]? --      Constitutional: Alert and oriented. Well appearing and in no distress.  Eyes: Conjunctivae are normal. No erythema or injection ENT   Head: Normocephalic and atraumatic.   Mouth/Throat: Mucous membranes are moist. Cardiovascular: Normal rate, regular rhythm.  Respiratory: Normal respiratory effort without tachypnea nor retractions.  Gastrointestinal: Suprapubic distention and tenderness to palpation. There is no CVA tenderness. Genitourinary: deferred Musculoskeletal: Nontender with normal range of motion in all extremities. No lower extremity tenderness nor edema. Neurologic:  Normal speech and language. No gross focal neurologic deficits are appreciated. Skin:  Skin is warm, dry and intact. No rash noted. Psychiatric: Mood and affect are normal. Patient exhibits appropriate insight and judgment.  ____________________________________________    LABS (pertinent positives/negatives)  Labs Reviewed - No data to display  ____________________________________________   EKG  None  ____________________________________________    RADIOLOGY  Bladder scan greater than 1 L  ____________________________________________   PROCEDURES  Procedure(s) performed: none  Critical Care performed: none  ____________________________________________   INITIAL IMPRESSION / ASSESSMENT AND PLAN / ED COURSE  Pertinent labs & imaging results that were available during my care of the patient were reviewed by me and considered in my medical decision making (see chart for details).  Patient's presentation is consistent with urinary  retention. Nurse will insert Foley. We will check creatinine, urinalysis and reevaluate.  ----------------------------------------- 8:38 PM on 10/20/2015 -----------------------------------------  Urinalysis consistent with UTI, we will give IV antibiotics in the emergency department discharge with 10 days of Cipro. He'll go home with a leg bag and follow-up with urology on Monday or Tuesday. Patient family understand the plan. Return precautions discussed.  ____________________________________________   FINAL CLINICAL IMPRESSION(S) / ED DIAGNOSES  Final diagnoses:  Urinary retention  UTI (lower urinary tract infection)          Lavonia Drafts, MD 10/20/15 2113

## 2015-10-20 NOTE — Op Note (Signed)
[facility] Gastroenterology Patient Name: Patrick Montoya Procedure Date: 10/20/2015 10:06 AM MRN: [id number] Account #: [id number] Date of Birth: [birth year] Admit Type: Outpatient Age: 75 Room: [facility] ENDO ROOM 4 Gender: Male Note Status: Finalized Procedure:            Upper GI endoscopy Indications:          Heartburn Providers:            Manya Silvas, MD Referring MD:         Glendon Axe (Referring MD) Medicines:            Propofol per Anesthesia Complications:        No immediate complications. Procedure:            Pre-Anesthesia Assessment:                       - After reviewing the risks and benefits, the patient                        was deemed in satisfactory condition to undergo the                        procedure.                       After obtaining informed consent, the endoscope was                        passed under direct vision. Throughout the procedure,                        the patient's blood pressure, pulse, and oxygen                        saturations were monitored continuously. The Endoscope                        was introduced through the mouth, and advanced to the                        second part of duodenum. The upper GI endoscopy was                        accomplished without difficulty. The patient tolerated                        the procedure well. Findings:      The examined esophagus was normal. GEJ 44cm      Diffuse mild inflammation characterized by erythema and granularity was       found in the gastric antrum. Biopsies were taken with a cold forceps for       histology. Biopsies were taken with a cold forceps for Helicobacter       pylori testing.      The examined duodenum was normal. Impression:           - Normal esophagus.                       - Gastritis. Biopsied.                       - Normal  examined duodenum. Recommendation:       - Await pathology results. Manya Silvas, MD 10/20/2015  10:17:21 AM This report has been signed electronically. Number of Addenda: 0 Note Initiated On: 10/20/2015 10:06 AM      [facility]

## 2015-10-20 NOTE — Transfer of Care (Signed)
Immediate Anesthesia Transfer of Care Note  Patient: Patrick Montoya  Procedure(s) Performed: Procedure(s): COLONOSCOPY WITH PROPOFOL (N/A) ESOPHAGOGASTRODUODENOSCOPY (EGD) WITH PROPOFOL (N/A)  Patient Location: PACU  Anesthesia Type:General  Level of Consciousness: sedated  Airway & Oxygen Therapy: Patient Spontanous Breathing and Patient connected to nasal cannula oxygen  Post-op Assessment: Report given to RN and Post -op Vital signs reviewed and stable  Post vital signs: Reviewed and stable  Last Vitals:  Filed Vitals:   10/20/15 0846  BP: 124/75  Pulse: 84  Temp: 36.2 C  Resp: 19    Last Pain: There were no vitals filed for this visit.       Complications: No apparent anesthesia complications

## 2015-10-20 NOTE — Anesthesia Postprocedure Evaluation (Signed)
Anesthesia Post Note  Patient: Patrick Montoya  Procedure(s) Performed: Procedure(s) (LRB): COLONOSCOPY WITH PROPOFOL (N/A) ESOPHAGOGASTRODUODENOSCOPY (EGD) WITH PROPOFOL (N/A)  Patient location during evaluation: Endoscopy Anesthesia Type: General Level of consciousness: awake and alert Pain management: pain level controlled Vital Signs Assessment: post-procedure vital signs reviewed and stable Respiratory status: spontaneous breathing, nonlabored ventilation, respiratory function stable and patient connected to nasal cannula oxygen Cardiovascular status: blood pressure returned to baseline and stable Postop Assessment: no signs of nausea or vomiting Anesthetic complications: no    Last Vitals:  Filed Vitals:   10/20/15 0846 10/20/15 1054  BP: 124/75 108/76  Pulse: 84 99  Temp: 36.2 C 35.8 C  Resp: 19 33    Last Pain: There were no vitals filed for this visit.               Precious Haws Kendre Jacinto

## 2015-10-23 ENCOUNTER — Encounter: Payer: Self-pay | Admitting: Urology

## 2015-10-23 ENCOUNTER — Ambulatory Visit (INDEPENDENT_AMBULATORY_CARE_PROVIDER_SITE_OTHER): Payer: Medicare Other | Admitting: Urology

## 2015-10-23 VITALS — BP 131/88 | HR 83 | Ht 70.0 in | Wt 166.0 lb

## 2015-10-23 DIAGNOSIS — R3989 Other symptoms and signs involving the genitourinary system: Secondary | ICD-10-CM

## 2015-10-23 DIAGNOSIS — R338 Other retention of urine: Secondary | ICD-10-CM

## 2015-10-23 DIAGNOSIS — N401 Enlarged prostate with lower urinary tract symptoms: Secondary | ICD-10-CM

## 2015-10-23 DIAGNOSIS — N138 Other obstructive and reflux uropathy: Secondary | ICD-10-CM

## 2015-10-23 LAB — URINE CULTURE

## 2015-10-23 LAB — SURGICAL PATHOLOGY

## 2015-10-23 NOTE — Progress Notes (Signed)
10/23/2015 9:35 AM   Patrick Montoya Mar 08, 1941 [id number]  Referring provider: Glendon Axe, MD [place] [facility] [place], [place] [address]  Chief Complaint  Patient presents with  . Urinary Retention    New Patient    HPI: Patient is a 75 year old Caucasian male who went into acute urinary retention after undergoing a colonoscopy who presents today for Foley catheter removal.  He states he underwent colonoscopy on 10/20/2015. After the procedure, he was unable to pass urine. He went 8 hours without urination and finally sought treatment in the emergency room. At that time a Foley catheter was placed and approximately 1 L of urine was returned.  States that prior to this he had no difficulty with urination. He states he underwent a microwave procedure with Dr. Maryan Puls approximately 4 years ago. He has been on doxazosin since that time as well.  He has not had any recent gross hematuria, dysuria or suprapubic pain. He denied fevers, chills, nausea or vomiting.   PMH: Past Medical History  Diagnosis Date  . COPD (chronic obstructive pulmonary disease) ([place])   . GERD (gastroesophageal reflux disease)   . Hypertension   . Gastric ulcer   . Prostate hypertrophy   . Rotator cuff tear right  . BPH (benign prostatic hyperplasia)     Surgical History: Past Surgical History  Procedure Laterality Date  . Tonsillectomy    . Cholecystectomy    . Colonoscopy with propofol N/A 10/20/2015    Procedure: COLONOSCOPY WITH PROPOFOL;  Surgeon: Manya Silvas, MD;  Location: [facility] ENDOSCOPY;  Service: Endoscopy;  Laterality: N/A;  . Esophagogastroduodenoscopy (egd) with propofol N/A 10/20/2015    Procedure: ESOPHAGOGASTRODUODENOSCOPY (EGD) WITH PROPOFOL;  Surgeon: Manya Silvas, MD;  Location: [facility] ENDOSCOPY;  Service: Endoscopy;  Laterality: N/A;  . Transurethral microwave therapy      Home Medications:    Medication List       This list is accurate  as of: 10/23/15  9:35 AM.  Always use your most recent med list.               ACETAMINOPHEN-GUAIFENESIN PO  Take 1 tablet by mouth daily.     albuterol (2.5 MG/3ML) 0.083% nebulizer solution  Commonly known as:  PROVENTIL  Take 2.5 mg by nebulization every 6 (six) hours as needed for wheezing or shortness of breath.     albuterol 108 (90 Base) MCG/ACT inhaler  Commonly known as:  PROVENTIL HFA;VENTOLIN HFA  Inhale 2 puffs into the lungs every 4 (four) hours as needed for wheezing or shortness of breath.     ALPRAZolam 0.5 MG tablet  Commonly known as:  XANAX  Take 0.5 mg by mouth at bedtime as needed for anxiety.     aspirin 81 MG tablet  Take 81 mg by mouth daily.     ciprofloxacin 500 MG tablet  Commonly known as:  CIPRO  Take 1 tablet (500 mg total) by mouth 2 (two) times daily.     doxazosin 4 MG tablet  Commonly known as:  CARDURA  Take 4 mg by mouth daily.     OMEPRAZOLE PO  Take by mouth.     POTASSIMIN PO  Take by mouth.     tiotropium 18 MCG inhalation capsule  Commonly known as:  SPIRIVA  Place 18 mcg into inhaler and inhale daily.     triamterene-hydrochlorothiazide 37.5-25 MG tablet  Commonly known as:  MAXZIDE-25  Take 1 tablet by mouth  daily.        Allergies: No Known Allergies  Family History: Family History  Problem Relation Age of Onset  . Prostate cancer Neg Hx   . Kidney cancer Neg Hx     Social History:  reports that he has been smoking Cigarettes.  He has been smoking about 1.00 pack per day. He has never used smokeless tobacco. He reports that he drinks about 7.2 oz of alcohol per week. He reports that he does not use illicit drugs.  ROS: UROLOGY Frequent Urination?: No Hard to postpone urination?: No Burning/pain with urination?: No Get up at night to urinate?: No Leakage of urine?: No Urine stream starts and stops?: No Trouble starting stream?: No Do you have to strain to urinate?: No Blood in urine?: No Urinary tract  infection?: No Sexually transmitted disease?: No Injury to kidneys or bladder?: No Painful intercourse?: No Weak stream?: No Erection problems?: No Penile pain?: No  Gastrointestinal Nausea?: No Vomiting?: No Indigestion/heartburn?: No Diarrhea?: No Constipation?: No  Constitutional Fever: No Night sweats?: No Weight loss?: No Fatigue?: No  Skin Skin rash/lesions?: No Itching?: No  Eyes Blurred vision?: No Double vision?: No  Ears/Nose/Throat Sore throat?: No Sinus problems?: No  Hematologic/Lymphatic Swollen glands?: No Easy bruising?: No  Cardiovascular Leg swelling?: No Chest pain?: No  Respiratory Cough?: No Shortness of breath?: No  Endocrine Excessive thirst?: No  Musculoskeletal Back pain?: No Joint pain?: No  Neurological Headaches?: No Dizziness?: No  Psychologic Depression?: No Anxiety?: No  Physical Exam: BP 131/88 mmHg  Pulse 83  Ht 5\' 10"  (1.778 m)  Wt 166 lb (75.297 kg)  BMI 23.82 kg/m2  Constitutional: Well nourished. Alert and oriented, No acute distress. HEENT: [place] AT, moist mucus membranes. Trachea midline, no masses. Cardiovascular: No clubbing, cyanosis, or edema. Respiratory: Normal respiratory effort, no increased work of breathing. GI: Abdomen is soft, non tender, non distended, no abdominal masses. Liver and spleen not palpable.  No hernias appreciated.  Stool sample for occult testing is not indicated.   GU: No CVA tenderness.  No bladder fullness or masses.  Patient with circumcised phallus.   Urethral meatus is patent.  No penile discharge. No penile lesions or rashes. Scrotum without lesions, cysts, rashes and/or edema.  Testicles are located scrotally bilaterally. No masses are appreciated in the testicles. Left and right epididymis are normal. Rectal: Patient with  normal sphincter tone. Anus and perineum without scarring or rashes. No rectal masses are appreciated. Prostate is approximately 60 grams, irregular, no  nodules are appreciated. Seminal vesicles are normal. Skin: No rashes, bruises or suspicious lesions. Lymph: No cervical or inguinal adenopathy. Neurologic: Grossly intact, no focal deficits, moving all 4 extremities. Psychiatric: Normal mood and affect.  Laboratory Data: Lab Results  Component Value Date   WBC 7.0 10/26/2012   HGB 14.7 10/26/2012   HCT 42.9 10/26/2012   MCV 101* 10/26/2012   PLT 176 10/26/2012    Lab Results  Component Value Date   CREATININE 0.94 10/20/2015     Lab Results  Component Value Date   TSH 0.41 07/03/2007     Lab Results  Component Value Date   AST 40* 10/26/2012   Lab Results  Component Value Date   ALT 75 10/26/2012     Catheter Removal  Patient is present today for a catheter removal.  10 ml of water was drained from the balloon. A 16 FR foley cath was removed from the bladder no complications were noted . Patient tolerated  well.  Assessment & Plan:    1. Acute urinary retention:   Catheter is removed today for voiding trial. He will increase his fluid intake and return to office by 3 PM if unable to void.  2. BPH with LUTS  - Continue doxazosin 4 mg daily  -RTC in one month for IPSS, PVR and exam   3. Abnormal DRE:   Patient's prostate was found to be irregular on today's exam. He'll return in 1 month for reexamination as the irregularity may be due to Foley catheter irritation.  Return in about 1 month (around 11/22/2015) for IPSS, PVR and exam.  These notes generated with voice recognition software. I apologize for typographical errors.  Zara Council, [facility] [address] [place], [place] [address] [phone]

## 2015-11-27 ENCOUNTER — Encounter: Payer: Self-pay | Admitting: Urology

## 2015-11-27 ENCOUNTER — Ambulatory Visit (INDEPENDENT_AMBULATORY_CARE_PROVIDER_SITE_OTHER): Payer: Medicare Other | Admitting: Urology

## 2015-11-27 VITALS — BP 121/83 | HR 86 | Ht 70.0 in | Wt 171.0 lb

## 2015-11-27 DIAGNOSIS — N138 Other obstructive and reflux uropathy: Secondary | ICD-10-CM

## 2015-11-27 DIAGNOSIS — N401 Enlarged prostate with lower urinary tract symptoms: Secondary | ICD-10-CM

## 2015-11-27 DIAGNOSIS — R338 Other retention of urine: Secondary | ICD-10-CM | POA: Diagnosis not present

## 2015-11-27 DIAGNOSIS — R3989 Other symptoms and signs involving the genitourinary system: Secondary | ICD-10-CM

## 2015-11-27 LAB — BLADDER SCAN AMB NON-IMAGING

## 2015-11-27 NOTE — Progress Notes (Signed)
8:49 AM   Patrick Montoya May 03, 1941 [id number]  Referring provider: Glendon Axe, MD [place] [facility] [place], [place] [address]  Chief Complaint  Patient presents with  . Benign Prostatic Hypertrophy    13month    HPI: Patient is a 75 year old Caucasian male who presents today for a one month check up after going into urinary rentention after undergoing a colonoscopy.  At his last visit, his Foley catheter was removed.  He has not been having difficulty with urination since that time.  He is continuing the doxazosin.    Background history He states he underwent colonoscopy on 10/20/2015. After the procedure, he was unable to pass urine. He went 8 hours without urination and finally sought treatment in the emergency room. At that time a Foley catheter was placed and approximately 1 L of urine was returned.  States that prior to this he had no difficulty with urination. He states he underwent a microwave procedure with Dr. Maryan Puls approximately 4 years ago. He has been on doxazosin since that time as well.  He has not had any recent gross hematuria, dysuria or suprapubic pain. He denied fevers, chills, nausea or vomiting.  His IPSS score was 4/1.  PVR is 38 mL.        IPSS      11/27/15 0800       International Prostate Symptom Score   How often have you had the sensation of not emptying your bladder? Not at All     How often have you had to urinate less than every two hours? Less than 1 in 5 times     How often have you found you stopped and started again several times when you urinated? Less than 1 in 5 times     How often have you found it difficult to postpone urination? Less than 1 in 5 times     How often have you had a weak urinary stream? Not at All     How often have you had to strain to start urination? Not at All     How many times did you typically get up at night to urinate? 1 Time     Total IPSS Score 4     Quality of Life due to  urinary symptoms   If you were to spend the rest of your life with your urinary condition just the way it is now how would you feel about that? Pleased        Score:  1-7 Mild 8-19 Moderate 20-35 Severe   PMH: Past Medical History  Diagnosis Date  . COPD (chronic obstructive pulmonary disease) ([place])   . GERD (gastroesophageal reflux disease)   . Hypertension   . Gastric ulcer   . Prostate hypertrophy   . Rotator cuff tear right  . BPH (benign prostatic hyperplasia)     Surgical History: Past Surgical History  Procedure Laterality Date  . Tonsillectomy    . Cholecystectomy    . Colonoscopy with propofol N/A 10/20/2015    Procedure: COLONOSCOPY WITH PROPOFOL;  Surgeon: Manya Silvas, MD;  Location: [facility] ENDOSCOPY;  Service: Endoscopy;  Laterality: N/A;  . Esophagogastroduodenoscopy (egd) with propofol N/A 10/20/2015    Procedure: ESOPHAGOGASTRODUODENOSCOPY (EGD) WITH PROPOFOL;  Surgeon: Manya Silvas, MD;  Location: [facility] ENDOSCOPY;  Service: Endoscopy;  Laterality: N/A;  . Transurethral microwave therapy      Home Medications:    Medication List  This list is accurate as of: 11/27/15  8:49 AM.  Always use your most recent med list.               ACETAMINOPHEN-GUAIFENESIN PO  Take 1 tablet by mouth daily.     albuterol (2.5 MG/3ML) 0.083% nebulizer solution  Commonly known as:  PROVENTIL  Take 2.5 mg by nebulization every 6 (six) hours as needed for wheezing or shortness of breath.     albuterol 108 (90 Base) MCG/ACT inhaler  Commonly known as:  PROVENTIL HFA;VENTOLIN HFA  Inhale 2 puffs into the lungs every 4 (four) hours as needed for wheezing or shortness of breath.     ALPRAZolam 0.5 MG tablet  Commonly known as:  XANAX  Take 0.5 mg by mouth at bedtime as needed for anxiety.     aspirin 81 MG tablet  Take 81 mg by mouth daily.     doxazosin 4 MG tablet  Commonly known as:  CARDURA  Take 4 mg by mouth daily.     KLOR-CON M10 10 MEQ tablet    Generic drug:  potassium chloride     omeprazole 40 MG capsule  Commonly known as:  PRILOSEC     tiotropium 18 MCG inhalation capsule  Commonly known as:  SPIRIVA  Place 18 mcg into inhaler and inhale daily.     triamterene-hydrochlorothiazide 37.5-25 MG tablet  Commonly known as:  MAXZIDE-25  Take 1 tablet by mouth daily.        Allergies: No Known Allergies  Family History: Family History  Problem Relation Age of Onset  . Prostate cancer Neg Hx   . Kidney cancer Neg Hx     Social History:  reports that he has been smoking Cigarettes.  He has been smoking about 1.00 pack per day. He has never used smokeless tobacco. He reports that he drinks about 7.2 oz of alcohol per week. He reports that he does not use illicit drugs.  ROS: UROLOGY Frequent Urination?: No Hard to postpone urination?: No Burning/pain with urination?: No Get up at night to urinate?: Yes Leakage of urine?: Yes Urine stream starts and stops?: No Trouble starting stream?: No Do you have to strain to urinate?: No Blood in urine?: No Urinary tract infection?: No Sexually transmitted disease?: No Injury to kidneys or bladder?: No Painful intercourse?: No Weak stream?: No Erection problems?: No Penile pain?: No  Gastrointestinal Nausea?: No Vomiting?: No Indigestion/heartburn?: No Diarrhea?: No Constipation?: No  Constitutional Fever: No Night sweats?: No Weight loss?: No Fatigue?: No  Skin Skin rash/lesions?: No Itching?: No  Eyes Blurred vision?: No Double vision?: No  Ears/Nose/Throat Sore throat?: No Sinus problems?: No  Hematologic/Lymphatic Swollen glands?: No Easy bruising?: No  Cardiovascular Leg swelling?: No Chest pain?: No  Respiratory Cough?: Yes Shortness of breath?: Yes  Endocrine Excessive thirst?: No  Musculoskeletal Back pain?: No Joint pain?: No  Neurological Headaches?: No Dizziness?: No  Psychologic Depression?: No Anxiety?:  No  Physical Exam: BP 121/83 mmHg  Pulse 86  Ht 5\' 10"  (1.778 m)  Wt 171 lb (77.565 kg)  BMI 24.54 kg/m2  Constitutional: Well nourished. Alert and oriented, No acute distress. HEENT: [place] AT, moist mucus membranes. Trachea midline, no masses. Cardiovascular: No clubbing, cyanosis, or edema. Respiratory: Normal respiratory effort, no increased work of breathing. GI: Abdomen is soft, non tender, non distended, no abdominal masses. Liver and spleen not palpable.  No hernias appreciated.  Stool sample for occult testing is not indicated.   GU: No  CVA tenderness.  No bladder fullness or masses.  Patient with circumcised phallus.   Urethral meatus is patent.  No penile discharge. No penile lesions or rashes. Scrotum without lesions, cysts, rashes and/or edema.  Testicles are located scrotally bilaterally. No masses are appreciated in the testicles. Left and right epididymis are normal. Rectal: Patient with  normal sphincter tone. Anus and perineum without scarring or rashes. No rectal masses are appreciated. Prostate is approximately 60 grams, irregular, no nodules are appreciated. Seminal vesicles are normal. Skin: No rashes, bruises or suspicious lesions. Lymph: No cervical or inguinal adenopathy. Neurologic: Grossly intact, no focal deficits, moving all 4 extremities. Psychiatric: Normal mood and affect.  Laboratory Data: Lab Results  Component Value Date   WBC 7.0 10/26/2012   HGB 14.7 10/26/2012   HCT 42.9 10/26/2012   MCV 101* 10/26/2012   PLT 176 10/26/2012    Lab Results  Component Value Date   CREATININE 0.94 10/20/2015     Lab Results  Component Value Date   TSH 0.41 07/03/2007     Lab Results  Component Value Date   AST 40* 10/26/2012   Lab Results  Component Value Date   ALT 75 10/26/2012      Assessment & Plan:    1. Acute urinary retention:   Resolved.  2. BPH with LUTS  - Continue doxazosin 4 mg daily  - RTC in 12 month for IPSS and exam   3.  Abnormal DRE:   Exam improved.  We did speak about the possibility of prostate cancer being the cause of the irregularity, but the AUA guidelines state that men at his age are not recommended to undergo PSA screenings.  It would be acceptable to wait until the patient is symptomatic or had a discreet nodule to pursue further studies.  He is agreeable to this plan of action.  We will see him in one year.    Return in about 1 year (around 11/26/2016) for IPSS and exam.  These notes generated with voice recognition software. I apologize for typographical errors.  Zara Council, [facility] [address] [place], [place] [address] [phone]

## 2016-05-31 ENCOUNTER — Emergency Department
Admission: EM | Admit: 2016-05-31 | Discharge: 2016-05-31 | Disposition: A | Discharge status: Home / Self Care | Payer: Medicare Other | Attending: Emergency Medicine | Admitting: Emergency Medicine

## 2016-05-31 ENCOUNTER — Emergency Department: Payer: Medicare Other

## 2016-05-31 ENCOUNTER — Encounter: Payer: Self-pay | Admitting: Intensive Care

## 2016-05-31 DIAGNOSIS — Z7982 Long term (current) use of aspirin: Secondary | ICD-10-CM | POA: Diagnosis not present

## 2016-05-31 DIAGNOSIS — I10 Essential (primary) hypertension: Secondary | ICD-10-CM | POA: Diagnosis not present

## 2016-05-31 DIAGNOSIS — Z79899 Other long term (current) drug therapy: Secondary | ICD-10-CM | POA: Insufficient documentation

## 2016-05-31 DIAGNOSIS — R0602 Shortness of breath: Secondary | ICD-10-CM | POA: Diagnosis present

## 2016-05-31 DIAGNOSIS — J441 Chronic obstructive pulmonary disease with (acute) exacerbation: Secondary | ICD-10-CM

## 2016-05-31 DIAGNOSIS — Z87891 Personal history of nicotine dependence: Secondary | ICD-10-CM | POA: Insufficient documentation

## 2016-05-31 LAB — CBC
HCT: 44.5 % (ref 40.0–52.0)
Hemoglobin: 15.3 g/dL (ref 13.0–18.0)
MCH: 34.1 pg — AB (ref 26.0–34.0)
MCHC: 34.4 g/dL (ref 32.0–36.0)
MCV: 99.2 fL (ref 80.0–100.0)
PLATELETS: 212 10*3/uL (ref 150–440)
RBC: 4.49 MIL/uL (ref 4.40–5.90)
RDW: 14.5 % (ref 11.5–14.5)
WBC: 7.2 10*3/uL (ref 3.8–10.6)

## 2016-05-31 LAB — BASIC METABOLIC PANEL
Anion gap: 7 (ref 5–15)
BUN: 15 mg/dL (ref 6–20)
CO2: 30 mmol/L (ref 22–32)
Calcium: 9.3 mg/dL (ref 8.9–10.3)
Chloride: 104 mmol/L (ref 101–111)
Creatinine, Ser: 0.95 mg/dL (ref 0.61–1.24)
GFR calc non Af Amer: >60 mL/min (ref >60)
Glucose, Bld: 93 mg/dL (ref 65–99)
Potassium: 4.3 mmol/L (ref 3.5–5.1)
SODIUM: 141 mmol/L (ref 135–145)

## 2016-05-31 LAB — TROPONIN I: Troponin I: <0.03 ng/mL (ref <0.03)

## 2016-05-31 LAB — INFLUENZA PANEL BY PCR (TYPE A & B)
Influenza A By PCR: NEGATIVE
Influenza B By PCR: NEGATIVE

## 2016-05-31 MED ORDER — LEVOFLOXACIN 750 MG PO TABS: 750.0000 mg | ORAL_TABLET | Freq: Every day | ORAL | 0 refills | Status: DC

## 2016-05-31 MED ORDER — PREDNISONE 20 MG PO TABS
40.0000 mg | ORAL_TABLET | Freq: Every day | ORAL | 0 refills | Status: DC
Start: 2016-05-31 — End: 2016-11-01

## 2016-05-31 MED ORDER — IPRATROPIUM-ALBUTEROL 0.5-2.5 (3) MG/3ML IN SOLN
3.0000 mL | Freq: Once | RESPIRATORY_TRACT | Status: AC
  Administered 2016-05-31: 3 mL via RESPIRATORY_TRACT
  Filled 2016-05-31: qty 3

## 2016-05-31 MED ORDER — PREDNISONE 20 MG PO TABS
40.0000 mg | ORAL_TABLET | Freq: Once | ORAL | Status: AC
  Administered 2016-05-31: 40 mg via ORAL
  Filled 2016-05-31: qty 2

## 2016-05-31 MED ORDER — ALBUTEROL SULFATE HFA 108 (90 BASE) MCG/ACT IN AERS: 2.0000 | INHALATION_SPRAY | Freq: Four times a day (QID) | RESPIRATORY_TRACT | 2 refills | Status: DC | PRN

## 2016-05-31 NOTE — Discharge Instructions (Signed)
We believe that your symptoms are caused today by an exacerbation of your COPD, and possibly bronchitis.  Please take the prescribed medications and any medications that you have at home for your COPD.  Follow up with your doctor as recommended.  If you develop any new or worsening symptoms, including but not limited to fever, persistent vomiting, worsening shortness of breath, or other symptoms that concern you, please return to the Emergency Department immediately. ° °

## 2016-05-31 NOTE — ED Notes (Signed)
AAOx3.  Skin warm and dry.  No SOB/ DOE.  Ambulates with easy and steady gait.

## 2016-05-31 NOTE — ED Triage Notes (Signed)
Patient presents to ER with COPD exacerbation. Patient reports a coughing spell started last night and he is a Building control surveyor and has been having trouble with his breathing for a couple of weeks now that has gotten progressively worse. Unlabored breathing in triage. NAD noted. Patient uses nebulizer at nights with some relief.

## 2016-05-31 NOTE — ED Notes (Signed)
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  Since that time patient has noticed increased SOB and DOE and increased intolerance to activity.

## 2016-05-31 NOTE — ED Notes (Signed)
Ambulated on RA.  Sats 91-93 %.  Patient tolerated well.  No SOB/ DOE.  Skin warm and dry.

## 2016-05-31 NOTE — ED Triage Notes (Signed)
Pt reports hx of COPD, shortness of breath since yesterday. Pt ambulatory to triage desk, no difficulty or distress noted.

## 2016-05-31 NOTE — ED Provider Notes (Signed)
[facility] Emergency Department Provider Note   ____________________________________________   First MD Initiated Contact with Patient 05/31/16 [phone]     (approximate)  I have reviewed the triage vital signs and the nursing notes.   HISTORY  Chief Complaint COPD    HPI Patrick Montoya is a 76 y.o. male here for evaluation of shortness of breath. Patient reports that he is started wheezing with a dry cough for the last 3-4 days, but he's also had progressively increasing shortness of breath for well over a year due to "stage III" COPD. He is not any home oxygen. 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 He has seen Dr. Raul Del of pulmonology, and is under treatment for COPD with him.  No fevers. No chest pain. No nausea or vomiting. Reports minimal shortness of breath at rest, more notable when he is walking. No leg swelling, no history of any blood clots, and reports he has known pulmonary nodules that pulmonary doctors are following a regular basis. He was a smoker. Quit about 2 months ago   Past Medical History:  Diagnosis Date  . BPH (benign prostatic hyperplasia)   . COPD (chronic obstructive pulmonary disease) ([place])   . Gastric ulcer   . GERD (gastroesophageal reflux disease)   . Hypertension   . Prostate hypertrophy   . Rotator cuff tear right    Patient Active Problem List   Diagnosis Date Noted  . Chronic bronchitis ([place]) 05/28/2015  . Gastro-esophageal reflux disease without esophagitis 05/26/2015  . 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 Chronic obstructive pulmonary disease ([place]) 04/11/2014  . Benign prostatic hypertrophy without urinary obstruction 01/21/2014  . Acute bronchitis with chronic obstructive pulmonary disease (COPD) ([place]) 01/21/2014  . Essential  (primary) hypertension 01/21/2014  . SINUSITIS- ACUTE-NOS 07/03/2007  . RENAL CALCULUS 06/05/2007  . HYPERTENSION 02/27/2007  . COPD 02/27/2007  . GERD 02/27/2007  . PEPTIC ULCER DISEASE 02/27/2007  . BENIGN PROSTATIC HYPERTROPHY 02/27/2007  . BURSITIS, RIGHT SHOULDER 02/27/2007  . COLONIC POLYPS, HX OF 02/27/2007  . FROZEN RIGHT SHOULDER 12/05/2006    Past Surgical History:  Procedure Laterality Date  . CHOLECYSTECTOMY    . COLONOSCOPY WITH PROPOFOL N/A 10/20/2015   Procedure: COLONOSCOPY WITH PROPOFOL;  Surgeon: Manya Silvas, MD;  Location: [facility] ENDOSCOPY;  Service: Endoscopy;  Laterality: N/A;  . ESOPHAGOGASTRODUODENOSCOPY (EGD) WITH PROPOFOL N/A 10/20/2015   Procedure: ESOPHAGOGASTRODUODENOSCOPY (EGD) WITH PROPOFOL;  Surgeon: Manya Silvas, MD;  Location: [facility] ENDOSCOPY;  Service: Endoscopy;  Laterality: N/A;  . TONSILLECTOMY    . TRANSURETHRAL MICROWAVE THERAPY      Prior to Admission medications   Medication Sig Start Date End Date Taking? Authorizing Provider  albuterol (PROVENTIL HFA;VENTOLIN HFA) 108 (90 Base) MCG/ACT inhaler Inhale 2 puffs into the lungs every 4 (four) hours as needed for wheezing or shortness of breath.   Yes Historical Provider, MD  albuterol (PROVENTIL) (2.5 MG/3ML) 0.083% nebulizer solution Take 5 mg by nebulization at bedtime as needed for wheezing or shortness of breath.    Yes Historical Provider, MD  ALPRAZolam Duanne Moron) 0.5 MG tablet Take 0.5 mg by mouth at bedtime as needed for anxiety.   Yes Historical Provider, MD  aspirin 81 MG tablet Take 81 mg by mouth daily.     Yes Historical Provider,  MD  doxazosin (CARDURA) 4 MG tablet Take 4 mg by mouth.    Yes Historical Provider, MD  guaifenesin (ROBITUSSIN) 100 MG/5ML syrup Take 200 mg by mouth at bedtime as needed for cough.   Yes Historical Provider, MD  KLOR-CON M10 10 MEQ tablet  10/31/15  Yes Historical Provider, MD  omeprazole (PRILOSEC) 40 MG capsule  11/06/15  Yes Historical Provider, MD    triamterene-hydrochlorothiazide (MAXZIDE-25) 37.5-25 MG tablet Take 1 tablet by mouth daily.   Yes Historical Provider, MD  albuterol (PROVENTIL HFA;VENTOLIN HFA) 108 (90 Base) MCG/ACT inhaler Inhale 2 puffs into the lungs every 6 (six) hours as needed for wheezing or shortness of breath. 05/31/16   Delman Kitten, MD  levofloxacin (LEVAQUIN) 750 MG tablet Take 1 tablet (750 mg total) by mouth daily. 05/31/16   Delman Kitten, MD  predniSONE (DELTASONE) 20 MG tablet Take 2 tablets (40 mg total) by mouth daily with breakfast. 05/31/16   Delman Kitten, MD    Allergies Patient has no known allergies.  Family History  Problem Relation Age of Onset  . Prostate cancer Neg Hx   . Kidney cancer Neg Hx     Social History Social History  Substance Use Topics  . Smoking status: Former Research scientist (life sciences)  . Smokeless tobacco: Never Used     Comment: Quit date March 28, 2016  . Alcohol use 7.2 oz/week    12 Cans of beer per week    Review of Systems Constitutional: No fever/chills Eyes: No visual changes. ENT: No sore throat.Slight runny nose and congestion the last 2-3 days. Cardiovascular: Denies chest pain. Respiratory: See history of present illness Gastrointestinal: No abdominal pain.  No nausea, no vomiting.  No diarrhea.  No constipation. Genitourinary: Negative for dysuria. Musculoskeletal: Negative for back pain. Skin: Negative for rash. Neurological: Negative for headaches, focal weakness or numbness.  10-point ROS otherwise negative.  ____________________________________________   PHYSICAL EXAM:  VITAL SIGNS: ED Triage Vitals [05/31/16 0827]  Enc Vitals Group     BP 127/80     Pulse Rate 68     Resp (!) 24     Temp 97.6 F (36.4 C)     Temp Source Oral     SpO2 92 %     Weight 175 lb (79.4 kg)     Height 5' 9.5" (1.765 m)     Head Circumference      Peak Flow      Pain Score      Pain Loc      Pain Edu?      Excl. in [place]?     Constitutional: Alert and oriented. Well appearing  and in no acute distress.Generally barrel chested in appearance. The patient is extremely pleasant. Eyes: Conjunctivae are normal. PERRL. EOMI. Head: Atraumatic. Nose: No congestion/rhinnorhea. Mouth/Throat: Mucous membranes are moist.  Oropharynx non-erythematous. Neck: No stridor.   Cardiovascular: Normal rate, regular rhythm. Grossly normal heart sounds.  Good peripheral circulation. Respiratory: Normal respiratory effort.  No retractions. There is mild and expiratory wheezing in the lower lobes bilateral with what appears to be dry crackles. No rhonchi. The patient speaks in phrases, and appears in no acute distress. Oxygen saturation 95% on room air as I was talking with him Gastrointestinal: Soft and nontender. No distention.  Musculoskeletal: No lower extremity tenderness nor edema.  No joint effusions. Neurologic:  Normal speech and language. No gross focal neurologic deficits are appreciated.  Skin:  Skin is warm, dry and intact. No rash noted. Psychiatric: Mood  and affect are normal. Speech and behavior are normal.  ____________________________________________   LABS (all labs ordered are listed, but only abnormal results are displayed)  Labs Reviewed  CBC - Abnormal; Notable for the following:       Result Value   MCH 34.1 (*)    All other components within normal limits  BASIC METABOLIC PANEL  INFLUENZA PANEL BY PCR (TYPE A & B)  TROPONIN I   ____________________________________________  EKG  Reviewed and interpreted by me at 8:30 AM Ventricular rate 70 QRS 90 QTc 440 Normal sinus rhythm, no ischemic changes noted ____________________________________________  RADIOLOGY  Dg Chest 2 View  Result Date: 05/31/2016 CLINICAL DATA:  COPD exacerbation.  Cough and shortness of breath EXAM: CHEST  2 VIEW COMPARISON:  Chest radiograph November 17, 2012 and chest CT April 11, 2015 FINDINGS: Lungs are mildly hyperexpanded. There is scarring with interstitial fibrosis in  portions of the mid and lower lung zones. There is no edema or consolidation. 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 There is diminished pulmonary vascularity in the upper lobe regions due to this bullous disease. Pulmonary vascularity otherwise is unremarkable. Heart size is normal. There is atherosclerotic calcification in the aorta. No adenopathy. No bone lesions. IMPRESSION: Underlying COPD with areas of scarring and fibrosis. No frank edema or consolidation. Stable cardiac silhouette. There is aortic atherosclerosis. Electronically Signed   By: Lowella Grip III M.D.   On: 05/31/2016 09:26    ____________________________________________   PROCEDURES  Procedure(s) performed: None  Procedures  Critical Care performed: No  ____________________________________________   INITIAL IMPRESSION / ASSESSMENT AND PLAN / ED COURSE  Pertinent labs & imaging results that were available during my care of the patient were reviewed by me and considered in my medical decision making (see chart for details).  Patient presents for cough and dyspnea and wheezing, somewhat progressive over several months time but also moderate worsening in the last few days. Afebrile, reassuring labs and chest x-ray. Patient does have notable wheezing and will be given nebulizer treatments. No evidence of acute coronary syndrome by EKG or clinical history. Patient does not appear to be at high risk for pulmonary embolism, no pleuritic chest pain, no leg swelling, no previous history of DVT or PE. Appears most consistent with exacerbation of patient's COPD.  Clinical Course as of May 31 1241  Fri May 31, 2016  1204 Patient reports feeling much improved. Currently resting comfortably with saturation 94% on room air.Patient feels comfortable, requesting to go home. His lungs are much improved, resting in respiratory comfortably speaking in full sentences. We'll trial  ambulation, with a plan to go home with the patient feels comfortable and dyspnea has improved.  [MQ]    Clinical Course User Index [MQ] Delman Kitten, MD    ----------------------------------------- 12:43 PM on 05/31/2016 -----------------------------------------  Patient ambulating well. He reports feeling much improved. Resting comfortably thereafter with no hypoxia with ambulation. Lungs much improved.  Return precautions and treatment recommendations and follow-up discussed with the patient who is agreeable with the plan.  ____________________________________________   FINAL CLINICAL IMPRESSION(S) / ED DIAGNOSES  Final diagnoses:  COPD exacerbation ([place])      NEW MEDICATIONS STARTED DURING THIS VISIT:  New Prescriptions   ALBUTEROL (PROVENTIL HFA;VENTOLIN HFA) 108 (90 BASE) MCG/ACT INHALER    Inhale 2 puffs into the lungs every 6 (six) hours as needed for wheezing or shortness of breath.   LEVOFLOXACIN (  LEVAQUIN) 750 MG TABLET    Take 1 tablet (750 mg total) by mouth daily.   PREDNISONE (DELTASONE) 20 MG TABLET    Take 2 tablets (40 mg total) by mouth daily with breakfast.     Note:  This document was prepared using Dragon voice recognition software and may include unintentional dictation errors.     Delman Kitten, MD 05/31/16 [phone]

## 2016-05-31 NOTE — ED Notes (Signed)
Patient denies pain and is resting comfortably.  

## 2016-05-31 NOTE — ED Notes (Signed)
AAOx3.  Skin warm and dry.  NO SOB/ DOE.  D/C home

## 2016-10-01 ENCOUNTER — Other Ambulatory Visit: Payer: Self-pay | Admitting: Specialist

## 2016-10-01 DIAGNOSIS — Z87898 Personal history of other specified conditions: Secondary | ICD-10-CM

## 2016-10-01 DIAGNOSIS — R0602 Shortness of breath: Secondary | ICD-10-CM

## 2016-10-11 ENCOUNTER — Ambulatory Visit
Admission: RE | Admit: 2016-10-11 | Discharge: 2016-10-11 | Disposition: A | Discharge status: Home / Self Care | Payer: Medicare Other | Source: Ambulatory Visit | Attending: Specialist | Admitting: Specialist

## 2016-10-11 DIAGNOSIS — R918 Other nonspecific abnormal finding of lung field: Secondary | ICD-10-CM | POA: Diagnosis not present

## 2016-10-11 DIAGNOSIS — R0602 Shortness of breath: Secondary | ICD-10-CM | POA: Diagnosis not present

## 2016-10-11 DIAGNOSIS — Z87898 Personal history of other specified conditions: Secondary | ICD-10-CM | POA: Diagnosis not present

## 2016-10-11 DIAGNOSIS — J439 Emphysema, unspecified: Secondary | ICD-10-CM | POA: Diagnosis not present

## 2016-10-14 ENCOUNTER — Other Ambulatory Visit: Payer: Self-pay | Admitting: Specialist

## 2016-10-14 DIAGNOSIS — R911 Solitary pulmonary nodule: Secondary | ICD-10-CM

## 2016-10-21 ENCOUNTER — Ambulatory Visit
Admission: RE | Admit: 2016-10-21 | Discharge: 2016-10-21 | Disposition: A | Discharge status: Home / Self Care | Payer: Medicare Other | Source: Ambulatory Visit | Attending: Specialist | Admitting: Specialist

## 2016-10-21 DIAGNOSIS — R918 Other nonspecific abnormal finding of lung field: Secondary | ICD-10-CM | POA: Insufficient documentation

## 2016-10-21 DIAGNOSIS — R911 Solitary pulmonary nodule: Secondary | ICD-10-CM

## 2016-10-21 LAB — GLUCOSE, CAPILLARY: Glucose-Capillary: 97 mg/dL (ref 65–99)

## 2016-10-21 MED ORDER — FLUDEOXYGLUCOSE F - 18 (FDG) INJECTION
12.0000 | Freq: Once | INTRAVENOUS | Status: AC | PRN
  Administered 2016-10-21: 12.39 via INTRAVENOUS

## 2016-10-28 ENCOUNTER — Other Ambulatory Visit: Payer: Self-pay

## 2016-11-01 ENCOUNTER — Encounter: Payer: Self-pay | Admitting: Cardiothoracic Surgery

## 2016-11-01 ENCOUNTER — Ambulatory Visit (INDEPENDENT_AMBULATORY_CARE_PROVIDER_SITE_OTHER): Payer: Medicare Other | Admitting: Cardiothoracic Surgery

## 2016-11-01 VITALS — BP 121/85 | HR 93 | Temp 97.8°F | Ht 69.0 in | Wt 170.0 lb

## 2016-11-01 DIAGNOSIS — R918 Other nonspecific abnormal finding of lung field: Secondary | ICD-10-CM | POA: Diagnosis not present

## 2016-11-01 NOTE — Progress Notes (Signed)
Patient ID: Patrick Montoya, male   DOB: 07/19/1940, 75 y.o.   MRN: [id number]  Chief Complaint  Patient presents with  . Other    Right Lung Nodule    Referred By Dr. Raul Del Reason for Referral right upper lobe mass  HPI Location, Quality, Duration, Severity, Timing, Context, Modifying Factors, Associated Signs and Symptoms.  Patrick Montoya is a 76 y.o. male.  He has been followed by Dr. Raul Del over the years with COPD. He's had multiple CT scans made for small bilateral pulmonary nodules over the years. 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 He states that his shortness of breath became so severe that he thought he would come to the emergency department. He was treated medically and was scheduled to follow-up with Dr. Raul Del. He saw Dr. Raul Del in May and he obtained a CT scan of the chest in June. A subsequent PET scan was performed afterwards. I reviewed those. The patient states that he has been feeling better since his episode in the emergency room. He does not use oxygen. He does have a history of COPD and a history of smoking as well as significant marijuana usage in the years gone by. He still smokes at least a pack cigarettes a day. He has had a cough but no fever. He's had no chest pain. He's had multiple pulmonary function studies made over the years as well and these reveal more recently an FEV1 is approximately 50% of predicted. Dr. Raul Del has requested my consultation regarding his right upper lobe mass and the patient is here today for that. The prior x-rays that were obtained back in December 2017 were reviewed as well. This nodule is new since that time.   Past Medical History:  Diagnosis Date  . BPH (benign prostatic hyperplasia)   . COPD (chronic obstructive pulmonary disease) ([place])   . Emphysema of lung ([place])   . Gastric ulcer   . GERD (gastroesophageal reflux disease)   .  Hypertension   . Prostate hypertrophy   . Rotator cuff tear right    Past Surgical History:  Procedure Laterality Date  . CHOLECYSTECTOMY    . COLONOSCOPY WITH PROPOFOL N/A 10/20/2015   Procedure: COLONOSCOPY WITH PROPOFOL;  Surgeon: Manya Silvas, MD;  Location: [facility] ENDOSCOPY;  Service: Endoscopy;  Laterality: N/A;  . ESOPHAGOGASTRODUODENOSCOPY (EGD) WITH PROPOFOL N/A 10/20/2015   Procedure: ESOPHAGOGASTRODUODENOSCOPY (EGD) WITH PROPOFOL;  Surgeon: Manya Silvas, MD;  [facility] ENDOSCOPY;  Service: Endoscopy;  Laterality: N/A;  . TONSILLECTOMY    . TRANSURETHRAL MICROWAVE THERAPY      Family History  Problem Relation Age of Onset  . Colon cancer Mother   . Hepatitis Son   . Prostate cancer Neg Hx   . Kidney cancer Neg Hx     Social History Social History  Substance Use Topics  . Smoking status: Current Every Day Smoker    Packs/day: 1.00    Years: 62.00    Types: Cigarettes  . Smokeless tobacco: Never Used     Comment: Quit date March 28, 2016  . Alcohol use 7.2 oz/week    12 Cans of beer per week    No Known Allergies  Current Outpatient Prescriptions  Medication Sig Dispense Refill  . albuterol (PROVENTIL HFA;VENTOLIN HFA) 108 (90 Base) MCG/ACT inhaler Inhale 2 puffs into the lungs every 4 (four) hours as needed for wheezing or shortness  of breath.    Marland Kitchen albuterol (PROVENTIL HFA;VENTOLIN HFA) 108 (90 Base) MCG/ACT inhaler Inhale 2 puffs into the lungs every 6 (six) hours as needed for wheezing or shortness of breath. 1 Inhaler 2  . albuterol (PROVENTIL) (2.5 MG/3ML) 0.083% nebulizer solution Take 5 mg by nebulization at bedtime as needed for wheezing or shortness of breath.     . ALPRAZolam (XANAX) 0.5 MG tablet Take 0.5 mg by mouth at bedtime as needed for anxiety.    Marland Kitchen aspirin 81 MG tablet Take 81 mg by mouth daily.      Marland Kitchen doxazosin (CARDURA) 4 MG tablet Take 4 mg by mouth.     . guaifenesin (ROBITUSSIN) 100 MG/5ML syrup Take 200 mg by mouth at bedtime as  needed for cough.    Marland Kitchen KLOR-CON M10 10 MEQ tablet     . levofloxacin (LEVAQUIN) 750 MG tablet Take 1 tablet (750 mg total) by mouth daily. 5 tablet 0  . omeprazole (PRILOSEC) 40 MG capsule     . predniSONE (DELTASONE) 10 MG tablet Take 10 mg by mouth as needed.    . tiotropium (SPIRIVA HANDIHALER) 18 MCG inhalation capsule Take 1 capsule by mouth 1 day or 1 dose.    . triamterene-hydrochlorothiazide (MAXZIDE-25) 37.5-25 MG tablet Take 1 tablet by mouth daily.     No current facility-administered medications for this visit.       Review of Systems A complete review of systems was asked and was negative except for the following positive findingsCough, shortness of breath, wheezing.  Blood pressure 121/85, pulse 93, temperature 97.8 F (36.6 C), temperature source Oral, height 5\' 9"  (1.753 m), weight 170 lb (77.1 kg), SpO2 (!) 89 %.  Physical Exam CONSTITUTIONAL:  Pleasant, well-developed, well-nourished, and in no acute distress. EYES: Pupils equal and reactive to light, Sclera non-icteric EARS, NOSE, MOUTH AND THROAT:  The oropharynx was clear.  Dentition is good repair.  Oral mucosa pink and moist. LYMPH NODES:  Lymph nodes in the neck and axillae were normal RESPIRATORY:  Lungs were clear.  Normal respiratory effort without pathologic use of accessory muscles of respiration CARDIOVASCULAR: Heart was regular without murmurs.  There were no carotid bruits. GI: The abdomen was soft, nontender, and nondistended. There were no palpable masses. There was no hepatosplenomegaly. There were normal bowel sounds in all quadrants. GU:  Rectal deferred.   MUSCULOSKELETAL:  Normal muscle strength and tone.  No clubbing or cyanosis.   SKIN:  There were no pathologic skin lesions.  There were no nodules on palpation. NEUROLOGIC:  Sensation is normal.  Cranial nerves are grossly intact. PSYCH:  Oriented to person, place and time.  Mood and affect are normal.  Data Reviewed CT scans and PET  scans  I have personally reviewed the patient's imaging, laboratory findings and medical records.    Assessment    I have independently reviewed the patient's CT scans and PET scans. There is a new irregularly-shaped nodule in the right upper lobe. This nodule is PET negative. I am still concerned this may represent a malignancy however and I have recommended to him that he follow-up in 3 months time with another CT scan.    Plan    The patient would like to follow-up with Dr. Raul Del in 3 months. He already has an appointment scheduled. I also asked the patient to contact Dr. Gust Brooms office to get the results of his sputum TB testing. We were unable to find that in the patient's record although he  states he did drop those off earlier this week. All his questions were answered. No follow-up was made for the patient at this time as he will follow-up with Dr. Raul Del.        Nestor Lewandowsky, MD 11/01/2016, 9:24 AM

## 2016-11-28 ENCOUNTER — Ambulatory Visit: Payer: Medicare Other | Admitting: Urology

## 2016-11-29 ENCOUNTER — Ambulatory Visit: Payer: Medicare Other | Admitting: Urology

## 2017-02-07 ENCOUNTER — Other Ambulatory Visit: Payer: Self-pay | Admitting: Specialist

## 2017-02-07 DIAGNOSIS — R918 Other nonspecific abnormal finding of lung field: Secondary | ICD-10-CM

## 2017-03-21 ENCOUNTER — Ambulatory Visit: Payer: Medicare Other | Attending: Specialist

## 2017-04-11 ENCOUNTER — Other Ambulatory Visit: Payer: Self-pay | Admitting: Specialist

## 2017-04-11 DIAGNOSIS — R9389 Abnormal findings on diagnostic imaging of other specified body structures: Secondary | ICD-10-CM

## 2017-04-11 DIAGNOSIS — R0609 Other forms of dyspnea: Secondary | ICD-10-CM

## 2017-04-11 DIAGNOSIS — R0600 Dyspnea, unspecified: Secondary | ICD-10-CM

## 2017-04-18 ENCOUNTER — Ambulatory Visit
Admission: RE | Admit: 2017-04-18 | Discharge: 2017-04-18 | Disposition: A | Discharge status: Home / Self Care | Payer: Medicare Other | Source: Ambulatory Visit | Attending: Specialist | Admitting: Specialist

## 2017-04-18 ENCOUNTER — Ambulatory Visit: Payer: Self-pay | Admitting: Podiatry

## 2017-04-18 DIAGNOSIS — I700 Atherosclerosis of aorta: Secondary | ICD-10-CM | POA: Diagnosis not present

## 2017-04-18 DIAGNOSIS — R918 Other nonspecific abnormal finding of lung field: Secondary | ICD-10-CM | POA: Insufficient documentation

## 2017-04-18 DIAGNOSIS — R0609 Other forms of dyspnea: Secondary | ICD-10-CM | POA: Diagnosis present

## 2017-04-18 DIAGNOSIS — R9389 Abnormal findings on diagnostic imaging of other specified body structures: Secondary | ICD-10-CM

## 2017-04-18 DIAGNOSIS — R0600 Dyspnea, unspecified: Secondary | ICD-10-CM

## 2017-04-18 DIAGNOSIS — J439 Emphysema, unspecified: Secondary | ICD-10-CM | POA: Insufficient documentation

## 2017-04-25 ENCOUNTER — Other Ambulatory Visit: Payer: Self-pay | Admitting: Specialist

## 2017-04-25 DIAGNOSIS — R911 Solitary pulmonary nodule: Secondary | ICD-10-CM

## 2017-10-31 ENCOUNTER — Other Ambulatory Visit: Payer: Self-pay

## 2017-10-31 ENCOUNTER — Emergency Department: Payer: Medicare Other

## 2017-10-31 ENCOUNTER — Other Ambulatory Visit
Admission: AD | Admit: 2017-10-31 | Discharge: 2017-10-31 | Disposition: A | Discharge status: Home / Self Care | Payer: Medicare Other | Attending: Family Medicine | Admitting: Family Medicine

## 2017-10-31 ENCOUNTER — Inpatient Hospital Stay
Admission: EM | Admit: 2017-10-31 | Discharge: 2017-11-01 | DRG: 564 | Disposition: A | Discharge status: Home / Self Care | Payer: Medicare Other | Attending: Internal Medicine | Admitting: Internal Medicine

## 2017-10-31 DIAGNOSIS — G9341 Metabolic encephalopathy: Secondary | ICD-10-CM | POA: Diagnosis present

## 2017-10-31 DIAGNOSIS — Z7982 Long term (current) use of aspirin: Secondary | ICD-10-CM | POA: Diagnosis not present

## 2017-10-31 DIAGNOSIS — J9601 Acute respiratory failure with hypoxia: Secondary | ICD-10-CM | POA: Diagnosis present

## 2017-10-31 DIAGNOSIS — E876 Hypokalemia: Secondary | ICD-10-CM | POA: Diagnosis present

## 2017-10-31 DIAGNOSIS — S2220XA Unspecified fracture of sternum, initial encounter for closed fracture: Secondary | ICD-10-CM | POA: Diagnosis not present

## 2017-10-31 DIAGNOSIS — Z79899 Other long term (current) drug therapy: Secondary | ICD-10-CM

## 2017-10-31 DIAGNOSIS — F17210 Nicotine dependence, cigarettes, uncomplicated: Secondary | ICD-10-CM | POA: Diagnosis present

## 2017-10-31 DIAGNOSIS — J449 Chronic obstructive pulmonary disease, unspecified: Secondary | ICD-10-CM | POA: Diagnosis present

## 2017-10-31 DIAGNOSIS — I10 Essential (primary) hypertension: Secondary | ICD-10-CM | POA: Diagnosis present

## 2017-10-31 DIAGNOSIS — F10129 Alcohol abuse with intoxication, unspecified: Secondary | ICD-10-CM | POA: Diagnosis present

## 2017-10-31 DIAGNOSIS — S60511A Abrasion of right hand, initial encounter: Secondary | ICD-10-CM | POA: Diagnosis present

## 2017-10-31 LAB — CBC
HCT: 43 % (ref 40.0–52.0)
HEMOGLOBIN: 14.8 g/dL (ref 13.0–18.0)
MCH: 35.1 pg — AB (ref 26.0–34.0)
MCHC: 34.3 g/dL (ref 32.0–36.0)
MCV: 102.4 fL — ABNORMAL HIGH (ref 80.0–100.0)
Platelets: 167 10*3/uL (ref 150–440)
RBC: 4.2 MIL/uL — AB (ref 4.40–5.90)
RDW: 13.5 % (ref 11.5–14.5)
WBC: 6.4 10*3/uL (ref 3.8–10.6)

## 2017-10-31 LAB — BASIC METABOLIC PANEL
ANION GAP: 11 (ref 5–15)
BUN: 13 mg/dL (ref 6–20)
CALCIUM: 8.9 mg/dL (ref 8.9–10.3)
CHLORIDE: 99 mmol/L — AB (ref 101–111)
CO2: 25 mmol/L (ref 22–32)
Creatinine, Ser: 0.8 mg/dL (ref 0.61–1.24)
GFR calc non Af Amer: >60 mL/min (ref >60)
Glucose, Bld: 101 mg/dL — ABNORMAL HIGH (ref 65–99)
Potassium: 2.9 mmol/L — ABNORMAL LOW (ref 3.5–5.1)
Sodium: 135 mmol/L (ref 135–145)

## 2017-10-31 LAB — TROPONIN I

## 2017-10-31 MED ORDER — IOPAMIDOL (ISOVUE-300) INJECTION 61%
75.0000 mL | Freq: Once | INTRAVENOUS | Status: AC | PRN
  Administered 2017-10-31: 75 mL via INTRAVENOUS

## 2017-10-31 MED ORDER — BACITRACIN ZINC 500 UNIT/GM EX OINT
TOPICAL_OINTMENT | CUTANEOUS | Status: AC
  Filled 2017-10-31: qty 1.8

## 2017-10-31 MED ORDER — MORPHINE SULFATE (PF) 4 MG/ML IV SOLN
4.0000 mg | Freq: Once | INTRAVENOUS | Status: AC
  Administered 2017-10-31: 4 mg via INTRAVENOUS
  Filled 2017-10-31: qty 1

## 2017-10-31 NOTE — ED Notes (Signed)
Blood obtained for [facility] from patient.  Area to right forearm cleansed with betadine and specimens obtained and given to [employer].
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
## 2017-10-31 NOTE — ED Notes (Signed)
Patient's abrasion on the right hand had bacitracin applied and dressing placed.  Patient tolerated this well and has no complaints.
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
## 2017-11-01 LAB — BASIC METABOLIC PANEL
Anion gap: 9 (ref 5–15)
BUN: 12 mg/dL (ref 6–20)
CALCIUM: 8.5 mg/dL — AB (ref 8.9–10.3)
CO2: 27 mmol/L (ref 22–32)
CREATININE: 0.74 mg/dL (ref 0.61–1.24)
Chloride: 102 mmol/L (ref 101–111)
GFR calc Af Amer: >60 mL/min (ref >60)
GLUCOSE: 105 mg/dL — AB (ref 65–99)
POTASSIUM: 3.3 mmol/L — AB (ref 3.5–5.1)
SODIUM: 138 mmol/L (ref 135–145)

## 2017-11-01 LAB — GLUCOSE, CAPILLARY: GLUCOSE-CAPILLARY: 74 mg/dL (ref 65–99)

## 2017-11-01 LAB — CBC
HCT: 39.5 % — ABNORMAL LOW (ref 40.0–52.0)
Hemoglobin: 13.6 g/dL (ref 13.0–18.0)
MCH: 35.3 pg — ABNORMAL HIGH (ref 26.0–34.0)
MCHC: 34.5 g/dL (ref 32.0–36.0)
MCV: 102.4 fL — ABNORMAL HIGH (ref 80.0–100.0)
PLATELETS: 149 10*3/uL — AB (ref 150–440)
RBC: 3.86 MIL/uL — ABNORMAL LOW (ref 4.40–5.90)
RDW: 13.7 % (ref 11.5–14.5)
WBC: 7.8 10*3/uL (ref 3.8–10.6)

## 2017-11-01 LAB — TROPONIN I: Troponin I: <0.03 ng/mL (ref <0.03)

## 2017-11-01 MED ORDER — ASPIRIN EC 81 MG PO TBEC
81.0000 mg | DELAYED_RELEASE_TABLET | Freq: Every day | ORAL | Status: DC
  Administered 2017-11-01: 81 mg via ORAL
  Filled 2017-11-01: qty 1

## 2017-11-01 MED ORDER — ACETAMINOPHEN 325 MG PO TABS: 650.0000 mg | ORAL_TABLET | Freq: Four times a day (QID) | ORAL | Status: DC | PRN

## 2017-11-01 MED ORDER — BISACODYL 5 MG PO TBEC: 5.0000 mg | DELAYED_RELEASE_TABLET | Freq: Every day | ORAL | Status: DC | PRN

## 2017-11-01 MED ORDER — ONDANSETRON HCL 4 MG PO TABS: 4.0000 mg | ORAL_TABLET | Freq: Four times a day (QID) | ORAL | Status: DC | PRN

## 2017-11-01 MED ORDER — DOCUSATE SODIUM 100 MG PO CAPS: 100.0000 mg | ORAL_CAPSULE | Freq: Two times a day (BID) | ORAL | 0 refills | Status: DC

## 2017-11-01 MED ORDER — THIAMINE HCL 100 MG PO TABS: 100.0000 mg | ORAL_TABLET | Freq: Every day | ORAL | Status: DC

## 2017-11-01 MED ORDER — ONDANSETRON HCL 4 MG/2ML IJ SOLN: 4.0000 mg | Freq: Four times a day (QID) | INTRAMUSCULAR | Status: DC | PRN

## 2017-11-01 MED ORDER — GUAIFENESIN 100 MG/5ML PO SOLN
200.0000 mg | Freq: Every evening | ORAL | Status: DC | PRN
  Filled 2017-11-01: qty 10

## 2017-11-01 MED ORDER — THIAMINE HCL 100 MG/ML IJ SOLN: 100.0000 mg | Freq: Every day | INTRAMUSCULAR | Status: DC

## 2017-11-01 MED ORDER — DOXAZOSIN MESYLATE 4 MG PO TABS
4.0000 mg | ORAL_TABLET | Freq: Every day | ORAL | Status: DC
  Filled 2017-11-01: qty 1

## 2017-11-01 MED ORDER — ADULT MULTIVITAMIN W/MINERALS CH
1.0000 | ORAL_TABLET | Freq: Every day | ORAL | Status: DC
  Administered 2017-11-01: 1 via ORAL
  Filled 2017-11-01: qty 1

## 2017-11-01 MED ORDER — POTASSIUM CHLORIDE IN NACL 20-0.9 MEQ/L-% IV SOLN
INTRAVENOUS | Status: DC
  Administered 2017-11-01: 08:00:00 via INTRAVENOUS
  Filled 2017-11-01 (×3): qty 1000

## 2017-11-01 MED ORDER — KETOROLAC TROMETHAMINE 30 MG/ML IJ SOLN
30.0000 mg | Freq: Three times a day (TID) | INTRAMUSCULAR | Status: DC | PRN
  Administered 2017-11-01: 30 mg via INTRAVENOUS
  Filled 2017-11-01: qty 1

## 2017-11-01 MED ORDER — ADULT MULTIVITAMIN W/MINERALS CH: 1.0000 | ORAL_TABLET | Freq: Every day | ORAL | Status: DC

## 2017-11-01 MED ORDER — PANTOPRAZOLE SODIUM 40 MG PO TBEC
40.0000 mg | DELAYED_RELEASE_TABLET | Freq: Every day | ORAL | Status: DC
  Administered 2017-11-01: 40 mg via ORAL
  Filled 2017-11-01: qty 1

## 2017-11-01 MED ORDER — LORAZEPAM 2 MG/ML IJ SOLN: 0.0000 mg | Freq: Four times a day (QID) | INTRAMUSCULAR | Status: DC

## 2017-11-01 MED ORDER — HYDROCODONE-ACETAMINOPHEN 5-325 MG PO TABS
1.0000 | ORAL_TABLET | ORAL | Status: DC | PRN
  Administered 2017-11-01 (×2): 2 via ORAL
  Filled 2017-11-01 (×2): qty 2

## 2017-11-01 MED ORDER — POTASSIUM CHLORIDE CRYS ER 20 MEQ PO TBCR
40.0000 meq | EXTENDED_RELEASE_TABLET | Freq: Two times a day (BID) | ORAL | Status: DC
  Administered 2017-11-01 (×2): 40 meq via ORAL
  Filled 2017-11-01 (×2): qty 2

## 2017-11-01 MED ORDER — OXYCODONE HCL 5 MG PO TABS: 5.0000 mg | ORAL_TABLET | Freq: Once | ORAL | 0 refills | Status: AC

## 2017-11-01 MED ORDER — IPRATROPIUM-ALBUTEROL 0.5-2.5 (3) MG/3ML IN SOLN
3.0000 mL | Freq: Four times a day (QID) | RESPIRATORY_TRACT | Status: DC
  Administered 2017-11-01 (×3): 3 mL via RESPIRATORY_TRACT
  Filled 2017-11-01 (×3): qty 3

## 2017-11-01 MED ORDER — HEPARIN SODIUM (PORCINE) 5000 UNIT/ML IJ SOLN
5000.0000 [IU] | Freq: Three times a day (TID) | INTRAMUSCULAR | Status: DC
  Administered 2017-11-01 (×2): 5000 [IU] via SUBCUTANEOUS
  Filled 2017-11-01 (×2): qty 1

## 2017-11-01 MED ORDER — ALPRAZOLAM 0.5 MG PO TABS: 0.5000 mg | ORAL_TABLET | Freq: Every evening | ORAL | Status: DC | PRN

## 2017-11-01 MED ORDER — LORAZEPAM 1 MG PO TABS: 1.0000 mg | ORAL_TABLET | Freq: Four times a day (QID) | ORAL | Status: DC | PRN

## 2017-11-01 MED ORDER — LORAZEPAM 2 MG/ML IJ SOLN: 1.0000 mg | Freq: Four times a day (QID) | INTRAMUSCULAR | Status: DC | PRN

## 2017-11-01 MED ORDER — ACETAMINOPHEN 650 MG RE SUPP: 650.0000 mg | Freq: Four times a day (QID) | RECTAL | Status: DC | PRN

## 2017-11-01 MED ORDER — FOLIC ACID 1 MG PO TABS
1.0000 mg | ORAL_TABLET | Freq: Every day | ORAL | Status: DC
  Administered 2017-11-01: 1 mg via ORAL
  Filled 2017-11-01: qty 1

## 2017-11-01 MED ORDER — DOCUSATE SODIUM 100 MG PO CAPS
100.0000 mg | ORAL_CAPSULE | Freq: Two times a day (BID) | ORAL | Status: DC
  Administered 2017-11-01 (×2): 100 mg via ORAL
  Filled 2017-11-01 (×2): qty 1

## 2017-11-01 MED ORDER — FOLIC ACID 1 MG PO TABS: 1.0000 mg | ORAL_TABLET | Freq: Every day | ORAL | Status: DC

## 2017-11-01 MED ORDER — SODIUM CHLORIDE 0.9 % IV SOLN
INTRAVENOUS | Status: DC
  Administered 2017-11-01: 75 mL/h via INTRAVENOUS

## 2017-11-01 MED ORDER — TRAZODONE HCL 50 MG PO TABS
25.0000 mg | ORAL_TABLET | Freq: Every evening | ORAL | Status: DC | PRN
  Administered 2017-11-01: 25 mg via ORAL
  Filled 2017-11-01: qty 1

## 2017-11-01 MED ORDER — VITAMIN B-1 100 MG PO TABS
100.0000 mg | ORAL_TABLET | Freq: Every day | ORAL | Status: DC
  Administered 2017-11-01: 100 mg via ORAL
  Filled 2017-11-01: qty 1

## 2017-11-01 MED ORDER — LORAZEPAM 2 MG/ML IJ SOLN: 0.0000 mg | Freq: Two times a day (BID) | INTRAMUSCULAR | Status: DC

## 2017-11-01 MED ORDER — TIOTROPIUM BROMIDE MONOHYDRATE 18 MCG IN CAPS
18.0000 ug | ORAL_CAPSULE | Freq: Every day | RESPIRATORY_TRACT | Status: DC
  Administered 2017-11-01: 18 ug via RESPIRATORY_TRACT
  Filled 2017-11-01: qty 5

## 2017-11-01 MED ORDER — OXYCODONE HCL 5 MG PO TABS: 5.0000 mg | ORAL_TABLET | Freq: Once | ORAL | Status: DC

## 2017-11-01 NOTE — Progress Notes (Signed)
   11/01/17 1215  Clinical Encounter Type  Visited With Patient  Visit Type Initial (order for advanced directive)  Referral From Nurse  Consult/Referral To Chaplain   Chaplain responded to OR regarding advanced directive.  Patient reported that he had recently had some strong pain medication and requested that chaplain leave document.  Chaplain encouraged patient to page chaplain when ready to discuss and/or complete document.

## 2017-11-01 NOTE — ED Notes (Signed)
Patient updated on CT scan results.  Patient's son Lennette Bihari) left his cell number [phone] if we need to get in touch with him or family.

## 2017-11-01 NOTE — Discharge Instructions (Signed)
Follow-up with primary care physician in 4 to 5 days Outpatient alcohol Anonymous follow-up

## 2017-11-01 NOTE — Progress Notes (Signed)
DISCHARGE NOTE:  Pt given discharge instructions and prescriptions. Pt verbalized understanding. Pts home oxygen delivered to room, 2 L oxygen applied to pt. Pt wheeled to car by staff. Pt's son Lennette Bihari providing transportation.

## 2017-11-01 NOTE — Progress Notes (Signed)
Pts O2 86% on room air and ambulating 86% room air. . 2L nasal cannula applied, O2 now 94%. MD Gouru notified.

## 2017-11-01 NOTE — Care Management Note (Signed)
Case Management Note  Patient Details  Name: Patrick Montoya MRN: [id number] Date of Birth: 1941-04-17  Subjective/Objective:        Patient to be discharged and has a need for home oxygen. Saturations checked on room air, resting and with ambulation. Saturation decrease qualified the patient for home oxygen. Patient chose to be set up with [place]. Referral placed with Jermaine and provided necessary equipment. No other HH or DME needs. RNCM to sign off             Action/Plan:   Expected Discharge Date:  11/01/17               Expected Discharge Plan:     In-House Referral:     Discharge planning Services  CM Consult  Post Acute Care Choice:  Durable Medical Equipment Choice offered to:  Patient  DME Arranged:  Oxygen DME Agency:  [place]:    [facility] Agency:     Status of Service:  Completed, signed off  If discussed at [place] of Stay Meetings, dates discussed:    Additional Comments:  Latanya Maudlin, RN 11/01/2017, 4:25 PM
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
## 2017-11-01 NOTE — Progress Notes (Signed)
SATURATION QUALIFICATIONS: (This note is used to comply with regulatory documentation for home oxygen)  Patient Saturations on Room Air at Rest = 86%  Patient Saturations on Room Air while Ambulating = 86%  Patient Saturations on 2 Liters of oxygen while Ambulating = 94%  Please briefly explain why patient needs home oxygen:
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
## 2017-11-01 NOTE — ED Notes (Signed)
Patient saturation levels dropping, MD placed patient on O2.  Patient tolerating this well at this time.

## 2017-11-02 NOTE — Care Management (Signed)
Patient spouse calling back to hospital requesting PT at home. Previously has declined. Spoke with MD who wrote psychical script since patient has discharged and filled out face to face . Both documents were faxed to [facility] with [facility] for new referral.

## 2017-11-26 ENCOUNTER — Other Ambulatory Visit: Payer: Self-pay | Admitting: Neurology

## 2017-11-26 DIAGNOSIS — R270 Ataxia, unspecified: Secondary | ICD-10-CM

## 2017-12-04 ENCOUNTER — Ambulatory Visit
Admission: RE | Admit: 2017-12-04 | Discharge: 2017-12-04 | Disposition: A | Discharge status: Home / Self Care | Payer: Medicare Other | Source: Ambulatory Visit | Attending: Neurology | Admitting: Neurology

## 2017-12-04 DIAGNOSIS — R259 Unspecified abnormal involuntary movements: Secondary | ICD-10-CM | POA: Insufficient documentation

## 2017-12-04 DIAGNOSIS — R270 Ataxia, unspecified: Secondary | ICD-10-CM | POA: Diagnosis present

## 2017-12-04 DIAGNOSIS — I6782 Cerebral ischemia: Secondary | ICD-10-CM | POA: Insufficient documentation

## 2017-12-04 DIAGNOSIS — G252 Other specified forms of tremor: Secondary | ICD-10-CM | POA: Insufficient documentation

## 2018-03-11 ENCOUNTER — Ambulatory Visit: Payer: Medicare Other | Attending: Specialist

## 2018-08-10 ENCOUNTER — Other Ambulatory Visit: Payer: Self-pay | Admitting: Internal Medicine

## 2018-08-10 DIAGNOSIS — R7989 Other specified abnormal findings of blood chemistry: Secondary | ICD-10-CM

## 2018-08-10 DIAGNOSIS — R945 Abnormal results of liver function studies: Secondary | ICD-10-CM

## 2018-09-10 ENCOUNTER — Ambulatory Visit: Payer: Medicare Other

## 2018-09-11 ENCOUNTER — Ambulatory Visit
Admission: RE | Admit: 2018-09-11 | Discharge: 2018-09-11 | Disposition: A | Discharge status: Home / Self Care | Payer: Medicare Other | Source: Ambulatory Visit | Attending: Internal Medicine | Admitting: Internal Medicine

## 2018-09-11 ENCOUNTER — Other Ambulatory Visit: Payer: Self-pay

## 2018-09-11 DIAGNOSIS — R7989 Other specified abnormal findings of blood chemistry: Secondary | ICD-10-CM

## 2018-09-11 DIAGNOSIS — R945 Abnormal results of liver function studies: Secondary | ICD-10-CM | POA: Insufficient documentation

## 2018-09-21 ENCOUNTER — Other Ambulatory Visit: Payer: Self-pay | Admitting: Specialist

## 2018-09-21 ENCOUNTER — Other Ambulatory Visit (HOSPITAL_COMMUNITY): Payer: Self-pay | Admitting: Specialist

## 2018-09-21 DIAGNOSIS — J849 Interstitial pulmonary disease, unspecified: Secondary | ICD-10-CM

## 2018-10-20 ENCOUNTER — Encounter
Admission: RE | Admit: 2018-10-20 | Discharge: 2018-10-20 | Disposition: A | Discharge status: Home / Self Care | Payer: Medicare Other | Source: Ambulatory Visit | Attending: Urology | Admitting: Urology

## 2018-10-20 ENCOUNTER — Other Ambulatory Visit: Payer: Self-pay

## 2018-10-20 DIAGNOSIS — G20 Parkinson's disease: Secondary | ICD-10-CM | POA: Insufficient documentation

## 2018-10-20 DIAGNOSIS — Z01818 Encounter for other preprocedural examination: Secondary | ICD-10-CM | POA: Diagnosis present

## 2018-10-20 DIAGNOSIS — J449 Chronic obstructive pulmonary disease, unspecified: Secondary | ICD-10-CM | POA: Insufficient documentation

## 2018-10-20 DIAGNOSIS — I10 Essential (primary) hypertension: Secondary | ICD-10-CM | POA: Insufficient documentation

## 2018-10-20 DIAGNOSIS — Z1159 Encounter for screening for other viral diseases: Secondary | ICD-10-CM | POA: Diagnosis not present

## 2018-10-20 DIAGNOSIS — N138 Other obstructive and reflux uropathy: Secondary | ICD-10-CM | POA: Insufficient documentation

## 2018-10-20 DIAGNOSIS — Z87891 Personal history of nicotine dependence: Secondary | ICD-10-CM | POA: Diagnosis not present

## 2018-10-20 DIAGNOSIS — N401 Enlarged prostate with lower urinary tract symptoms: Secondary | ICD-10-CM | POA: Insufficient documentation

## 2018-10-20 DIAGNOSIS — Z79899 Other long term (current) drug therapy: Secondary | ICD-10-CM | POA: Insufficient documentation

## 2018-10-20 DIAGNOSIS — Z9049 Acquired absence of other specified parts of digestive tract: Secondary | ICD-10-CM | POA: Insufficient documentation

## 2018-10-20 HISTORY — DX: Abnormal levels of other serum enzymes: R74.8

## 2018-10-20 HISTORY — DX: Urinary tract infection, site not specified: N39.0

## 2018-10-20 HISTORY — DX: Unspecified viral hepatitis B without hepatic coma: B19.10

## 2018-10-20 HISTORY — DX: Parkinson's disease: G20

## 2018-10-20 HISTORY — DX: Parkinson's disease without dyskinesia, without mention of fluctuations: G20.A1

## 2018-10-20 LAB — COMPREHENSIVE METABOLIC PANEL
ALT: 43 U/L (ref 0–44)
AST: 43 U/L — ABNORMAL HIGH (ref 15–41)
Albumin: 3.9 g/dL (ref 3.5–5.0)
Alkaline Phosphatase: 80 U/L (ref 38–126)
Anion gap: 9 (ref 5–15)
BUN: 11 mg/dL (ref 8–23)
CO2: 27 mmol/L (ref 22–32)
Calcium: 8.7 mg/dL — ABNORMAL LOW (ref 8.9–10.3)
Chloride: 107 mmol/L (ref 98–111)
Creatinine, Ser: 0.68 mg/dL (ref 0.61–1.24)
GFR calc Af Amer: >60 mL/min (ref >60)
GFR calc non Af Amer: >60 mL/min (ref >60)
Glucose, Bld: 121 mg/dL — ABNORMAL HIGH (ref 70–99)
Potassium: 3.7 mmol/L (ref 3.5–5.1)
Sodium: 143 mmol/L (ref 135–145)
Total Bilirubin: 0.5 mg/dL (ref 0.3–1.2)
Total Protein: 7.2 g/dL (ref 6.5–8.1)

## 2018-10-20 LAB — CBC
HCT: 42.7 % (ref 39.0–52.0)
Hemoglobin: 14.2 g/dL (ref 13.0–17.0)
MCH: 33.8 pg (ref 26.0–34.0)
MCHC: 33.3 g/dL (ref 30.0–36.0)
MCV: 101.7 fL — ABNORMAL HIGH (ref 80.0–100.0)
Platelets: 156 10*3/uL (ref 150–400)
RBC: 4.2 MIL/uL — ABNORMAL LOW (ref 4.22–5.81)
RDW: 13.1 % (ref 11.5–15.5)
WBC: 4.9 10*3/uL (ref 4.0–10.5)
nRBC: 0 % (ref 0.0–0.2)

## 2018-10-20 NOTE — H&P (Signed)
NAME: Patrick Montoya, Patrick Montoya MEDICAL RECORD [id number] ACCOUNT [id number] DATE OF BIRTH:25-Sep-1940 FACILITY: [facility] LOCATION: [facility] PHYSICIAN:Roshini Fulwider Farrel Conners, MD  HISTORY AND PHYSICAL  DATE OF ADMISSION:  10/20/2018  CHIEF COMPLAINT:  Difficulty voiding.  HISTORY OF PRESENT ILLNESS:  The patient is a 78 year old Caucasian male with BPH and significant lower urinary tract symptoms.  He was also noted to have an elevated PSA of 4.7.  Evaluation in the office indicated a prostate ultrasound of 51.3 g without  hypoechoic lesions.  Uroflow study indicated a maximum flow rate of 11 mL per second with average flow rate of 5.5 mL/sec and a postvoid residual of 79 mL.  Cystoscopy indicated trilobar BPH with intravesical growth of median lobe.  The patient comes in  now for photovaporization of prostate with GreenLight laser.  ALLERGIES:  No drug allergies.  CURRENT MEDICATIONS:  Potassium, doxazosin, omeprazole, Ventolin, carbidopa-levodopa, vitamin B12 and Xanax.  PAST SURGICAL HISTORY: 1.  ESWL 2006. 2.  TUMT 2013. 3.  Cholecystectomy 2012. 4.  Repair of a fractured sternum in 2020.  PAST AND CURRENT MEDICAL CONDITION: 1.  Hypertension. 2.  COPD. 3.  Unspecified liver disease. 4.  Parkinson disease. 5.  History of kidney stones. 6.  BPH.  REVIEW OF SYSTEMS:  The patient decreased auditory acuity.  He has a chronic cough due to COPD.  He has chronic shortness of breath.  He denies chest pain, diabetes or stroke.  SOCIAL HISTORY:  The patient quit smoking in 2019 with a greater than 60-pack-year history.  He consumes 2-77 alcoholic beverages per week.  FAMILY HISTORY:  Father died at age 44 of a stroke.  Mother died at age 7 with a stroke.  PHYSICAL EXAMINATION: VITAL SIGNS:  Height 5 feet 9 inches, weight 165. GENERAL:  A well-nourished, white male in no acute distress. HEENT:  Sclerae were clear.  Pupils were equally round, reactive to light and accommodation.  Extraocular  movements were intact. NECK:  No palpable masses or tenderness. LYMPHATIC:  No palpable neck or groin or inguinal adenopathy. PULMONARY:  Lungs clear to auscultation. CARDIOVASCULAR:  Regular rhythm and rate without audible murmurs. ABDOMEN:  Soft, nontender abdomen. GENITOURINARY:  Uncircumcised.  Testes atrophic, 16 mL in size each. RECTAL:  Greater than 50 g, smooth, nontender prostate. NEUROMUSCULAR:  Alert and oriented x3.  Nonfocal.  IMPRESSION: 1.  Significant benign prostatic hypertrophy with bladder outlet obstruction. 2.  Elevated PSA due to hypertrophy.  PLAN:  Photovaporization of prostate with GreenLight laser.  LN/NUANCE  D:10/20/2018 T:10/20/2018 JOB:[id number]

## 2018-10-20 NOTE — Patient Instructions (Signed)
Your procedure is scheduled on: 10/27/2018 Tues Report to Same Day Surgery 2nd floor medical mall [facility] Entrance-take elevator on left to 2nd floor.  Check in with surgery information desk.) To find out your arrival time please call [phone] between 1PM - 3PM on 10/26/2018 Mon  Remember: Instructions that are not followed completely may result in serious medical risk, up to and including death, or upon the discretion of your surgeon and anesthesiologist your surgery may need to be rescheduled.    _x___ 1. Do not eat food after midnight the night before your procedure. You may drink clear liquids up to 2 hours before you are scheduled to arrive at the hospital for your procedure.  Do not drink clear liquids within 2 hours of your scheduled arrival to the hospital.  Clear liquids include  --Water or Apple juice without pulp  --Clear carbohydrate beverage such as ClearFast or Gatorade  --Black Coffee or Clear Tea (No milk, no creamers, do not add anything to                  the coffee or Tea Type 1 and type 2 diabetics should only drink water.   ____Ensure clear carbohydrate drink on the way to the hospital for bariatric patients  ____Ensure clear carbohydrate drink 3 hours before surgery for Dr Dwyane Luo patients if physician instructed.   No gum chewing or hard candies.     __x__ 2. No Alcohol for 24 hours before or after surgery.   __x__3. No Smoking or e-cigarettes for 24 prior to surgery.  Do not use any chewable tobacco products for at least 6 hour prior to surgery   ____  4. Bring all medications with you on the day of surgery if instructed.    __x__ 5. Notify your doctor if there is any change in your medical condition     (cold, fever, infections).    x___6. On the morning of surgery brush your teeth with toothpaste and water.  You may rinse your mouth with mouth wash if you wish.  Do not swallow any toothpaste or mouthwash.   Do not wear jewelry, make-up,  hairpins, clips or nail polish.  Do not wear lotions, powders, or perfumes. You may wear deodorant.  Do not shave 48 hours prior to surgery. Men may shave face and neck.  Do not bring valuables to the hospital.    [facility] is not responsible for any belongings or valuables.               Contacts, dentures or bridgework may not be worn into surgery.  Leave your suitcase in the car. After surgery it may be brought to your room.  For patients admitted to the hospital, discharge time is determined by your                       treatment team.  _  Patients discharged the day of surgery will not be allowed to drive home.  You will need someone to drive you home and stay with you the night of your procedure.    Please read over the following fact sheets that you were given:   [facility] Preparing for Surgery and or MRSA Information   _x___ Take anti-hypertensive listed below, cardiac, seizure, asthma,     anti-reflux and psychiatric medicines. These include:  1. albuterol (PROVENTIL HFA;VENTOLIN HFA  2.amLODipine (NORVASC) 2.5 MG tablet  3.carbidopa-levodopa (SINEMET CR  4.doxazosin (CARDURA)  5.omeprazole (PRILOSEC) 40 MG capsule  6.tiotropium (SPIRIVA HANDIHALER) 18 MCG inhalation capsule  ____Fleets enema or Magnesium Citrate as directed.   ____ Use CHG Soap or sage wipes as directed on instruction sheet   _x___ Use inhalers on the day of surgery and bring to hospital day of surgery  ____ Stop Metformin and Janumet 2 days prior to surgery.    ____ Take 1/2 of usual insulin dose the night before surgery and none on the morning     surgery.   _x___ Follow recommendations from Cardiologist, Pulmonologist or PCP regarding          stopping Aspirin, Coumadin, Plavix ,Eliquis, Effient, or Pradaxa, and Pletal.  X____Stop Anti-inflammatories such as Advil, Aleve, Ibuprofen, Motrin, Naproxen, Naprosyn, Goodies powders or aspirin products. OK to take Tylenol and                           Celebrex.   _x___ Stop supplements until after surgery.  But may continue Vitamin D, Vitamin B,       and multivitamin.   ____ Bring C-Pap to the hospital.

## 2018-10-20 NOTE — H&P (Deleted)
  The note originally documented on this encounter has been moved the the encounter in which it belongs.  

## 2018-10-22 NOTE — Pre-Procedure Instructions (Signed)
EKG COMPARED WITH PREVIOUS 

## 2018-10-23 ENCOUNTER — Other Ambulatory Visit
Admission: RE | Admit: 2018-10-23 | Discharge: 2018-10-23 | Disposition: A | Discharge status: Home / Self Care | Payer: Medicare Other | Source: Ambulatory Visit | Attending: Urology | Admitting: Urology

## 2018-10-23 ENCOUNTER — Other Ambulatory Visit: Payer: Self-pay

## 2018-10-23 DIAGNOSIS — Z01818 Encounter for other preprocedural examination: Secondary | ICD-10-CM | POA: Diagnosis not present

## 2018-10-24 LAB — NOVEL CORONAVIRUS, NAA (HOSP ORDER, SEND-OUT TO REF LAB; TAT 18-24 HRS): SARS-CoV-2, NAA: NOT DETECTED

## 2018-10-27 ENCOUNTER — Encounter: Payer: Self-pay | Admitting: Anesthesiology

## 2018-10-27 MED ORDER — CEFAZOLIN SODIUM-DEXTROSE 1-4 GM/50ML-% IV SOLN
1.0000 g | Freq: Once | INTRAVENOUS | Status: AC
  Administered 2018-11-17: 13:00:00 1 g via INTRAVENOUS

## 2018-11-06 ENCOUNTER — Other Ambulatory Visit: Payer: Self-pay | Admitting: Specialist

## 2018-11-06 DIAGNOSIS — J849 Interstitial pulmonary disease, unspecified: Secondary | ICD-10-CM

## 2018-11-06 DIAGNOSIS — R911 Solitary pulmonary nodule: Secondary | ICD-10-CM

## 2018-11-10 ENCOUNTER — Ambulatory Visit: Admission: RE | Admit: 2018-11-10 | Payer: Medicare Other | Source: Ambulatory Visit

## 2018-11-11 ENCOUNTER — Other Ambulatory Visit: Payer: Self-pay

## 2018-11-11 ENCOUNTER — Ambulatory Visit
Admission: RE | Admit: 2018-11-11 | Discharge: 2018-11-11 | Disposition: A | Discharge status: Home / Self Care | Payer: Medicare Other | Source: Ambulatory Visit | Attending: Specialist | Admitting: Specialist

## 2018-11-11 ENCOUNTER — Encounter (INDEPENDENT_AMBULATORY_CARE_PROVIDER_SITE_OTHER): Payer: Self-pay

## 2018-11-11 DIAGNOSIS — J849 Interstitial pulmonary disease, unspecified: Secondary | ICD-10-CM | POA: Insufficient documentation

## 2018-11-11 DIAGNOSIS — R911 Solitary pulmonary nodule: Secondary | ICD-10-CM | POA: Diagnosis present

## 2018-11-13 ENCOUNTER — Other Ambulatory Visit: Payer: Self-pay

## 2018-11-13 ENCOUNTER — Other Ambulatory Visit
Admission: RE | Admit: 2018-11-13 | Discharge: 2018-11-13 | Disposition: A | Discharge status: Home / Self Care | Payer: Medicare Other | Source: Ambulatory Visit | Attending: Urology | Admitting: Urology

## 2018-11-13 DIAGNOSIS — Z1159 Encounter for screening for other viral diseases: Secondary | ICD-10-CM | POA: Insufficient documentation

## 2018-11-13 DIAGNOSIS — Z01812 Encounter for preprocedural laboratory examination: Secondary | ICD-10-CM | POA: Diagnosis present

## 2018-11-13 LAB — SARS CORONAVIRUS 2 (TAT 6-24 HRS): SARS Coronavirus 2: NEGATIVE

## 2018-11-17 ENCOUNTER — Ambulatory Visit
Admission: RE | Admit: 2018-11-17 | Discharge: 2018-11-17 | Disposition: A | Discharge status: Home / Self Care | Payer: Medicare Other | Attending: Urology | Admitting: Urology

## 2018-11-17 ENCOUNTER — Ambulatory Visit: Payer: Medicare Other | Admitting: Certified Registered Nurse Anesthetist

## 2018-11-17 ENCOUNTER — Encounter
Admission: RE | Disposition: A | Discharge status: Home / Self Care | Payer: Self-pay | Source: Home / Self Care | Attending: Urology

## 2018-11-17 ENCOUNTER — Encounter: Payer: Self-pay | Admitting: *Deleted

## 2018-11-17 ENCOUNTER — Other Ambulatory Visit: Payer: Self-pay

## 2018-11-17 DIAGNOSIS — N401 Enlarged prostate with lower urinary tract symptoms: Secondary | ICD-10-CM | POA: Insufficient documentation

## 2018-11-17 DIAGNOSIS — I10 Essential (primary) hypertension: Secondary | ICD-10-CM | POA: Diagnosis not present

## 2018-11-17 DIAGNOSIS — N138 Other obstructive and reflux uropathy: Secondary | ICD-10-CM | POA: Diagnosis not present

## 2018-11-17 DIAGNOSIS — J449 Chronic obstructive pulmonary disease, unspecified: Secondary | ICD-10-CM | POA: Diagnosis not present

## 2018-11-17 DIAGNOSIS — K219 Gastro-esophageal reflux disease without esophagitis: Secondary | ICD-10-CM | POA: Diagnosis not present

## 2018-11-17 DIAGNOSIS — G20 Parkinson's disease: Secondary | ICD-10-CM | POA: Insufficient documentation

## 2018-11-17 DIAGNOSIS — Z79899 Other long term (current) drug therapy: Secondary | ICD-10-CM | POA: Diagnosis not present

## 2018-11-17 DIAGNOSIS — Z87891 Personal history of nicotine dependence: Secondary | ICD-10-CM | POA: Insufficient documentation

## 2018-11-17 DIAGNOSIS — N320 Bladder-neck obstruction: Secondary | ICD-10-CM | POA: Diagnosis not present

## 2018-11-17 DIAGNOSIS — M1990 Unspecified osteoarthritis, unspecified site: Secondary | ICD-10-CM | POA: Diagnosis not present

## 2018-11-17 HISTORY — PX: GREEN LIGHT LASER TURP (TRANSURETHRAL RESECTION OF PROSTATE: SHX6260

## 2018-11-17 SURGERY — GREEN LIGHT LASER TURP (TRANSURETHRAL RESECTION OF PROSTATE
Anesthesia: General | Site: Prostate

## 2018-11-17 MED ORDER — ONDANSETRON HCL 4 MG/2ML IJ SOLN
INTRAMUSCULAR | Status: DC | PRN
  Administered 2018-11-17: 4 mg via INTRAVENOUS

## 2018-11-17 MED ORDER — MIDAZOLAM HCL 2 MG/2ML IJ SOLN
INTRAMUSCULAR | Status: DC | PRN
  Administered 2018-11-17: 2 mg via INTRAVENOUS

## 2018-11-17 MED ORDER — EPHEDRINE SULFATE 50 MG/ML IJ SOLN
INTRAMUSCULAR | Status: DC | PRN
  Administered 2018-11-17 (×2): 10 mg via INTRAVENOUS
  Administered 2018-11-17: 5 mg via INTRAVENOUS

## 2018-11-17 MED ORDER — MIDAZOLAM HCL 2 MG/2ML IJ SOLN
INTRAMUSCULAR | Status: AC
  Filled 2018-11-17: qty 2

## 2018-11-17 MED ORDER — PHENYLEPHRINE HCL (PRESSORS) 10 MG/ML IV SOLN
INTRAVENOUS | Status: DC | PRN
  Administered 2018-11-17 (×7): 100 ug via INTRAVENOUS

## 2018-11-17 MED ORDER — LIDOCAINE HCL (PF) 1 % IJ SOLN
INTRAMUSCULAR | Status: AC
  Filled 2018-11-17: qty 5

## 2018-11-17 MED ORDER — URIBEL 118 MG PO CAPS: 1.0000 | ORAL_CAPSULE | Freq: Four times a day (QID) | ORAL | 3 refills | Status: DC | PRN

## 2018-11-17 MED ORDER — MEPERIDINE HCL 50 MG/ML IJ SOLN: 6.2500 mg | INTRAMUSCULAR | Status: DC | PRN

## 2018-11-17 MED ORDER — ROCURONIUM BROMIDE 50 MG/5ML IV SOLN
INTRAVENOUS | Status: AC
  Filled 2018-11-17: qty 1

## 2018-11-17 MED ORDER — SUGAMMADEX SODIUM 200 MG/2ML IV SOLN
INTRAVENOUS | Status: DC | PRN
  Administered 2018-11-17: 200 mg via INTRAVENOUS

## 2018-11-17 MED ORDER — BELLADONNA ALKALOIDS-OPIUM 16.2-60 MG RE SUPP
RECTAL | Status: AC
  Filled 2018-11-17: qty 1

## 2018-11-17 MED ORDER — LIDOCAINE HCL (CARDIAC) PF 100 MG/5ML IV SOSY
PREFILLED_SYRINGE | INTRAVENOUS | Status: DC | PRN
  Administered 2018-11-17: 100 mg via INTRAVENOUS

## 2018-11-17 MED ORDER — FENTANYL CITRATE (PF) 100 MCG/2ML IJ SOLN: 25.0000 ug | INTRAMUSCULAR | Status: DC | PRN

## 2018-11-17 MED ORDER — LIDOCAINE HCL URETHRAL/MUCOSAL 2 % EX GEL
CUTANEOUS | Status: AC
  Filled 2018-11-17: qty 10

## 2018-11-17 MED ORDER — LACTATED RINGERS IV SOLN
INTRAVENOUS | Status: DC
  Administered 2018-11-17: 13:00:00 via INTRAVENOUS

## 2018-11-17 MED ORDER — PROMETHAZINE HCL 25 MG/ML IJ SOLN
INTRAMUSCULAR | Status: AC
  Filled 2018-11-17: qty 1

## 2018-11-17 MED ORDER — HYDROCODONE-ACETAMINOPHEN 7.5-325 MG PO TABS
1.0000 | ORAL_TABLET | Freq: Once | ORAL | Status: DC | PRN
  Filled 2018-11-17: qty 1

## 2018-11-17 MED ORDER — ROCURONIUM BROMIDE 100 MG/10ML IV SOLN
INTRAVENOUS | Status: DC | PRN
  Administered 2018-11-17: 50 mg via INTRAVENOUS
  Administered 2018-11-17 (×2): 20 mg via INTRAVENOUS

## 2018-11-17 MED ORDER — ACETAMINOPHEN 160 MG/5ML PO SOLN
325.0000 mg | ORAL | Status: DC | PRN
  Filled 2018-11-17: qty 20.3

## 2018-11-17 MED ORDER — ONDANSETRON HCL 4 MG/2ML IJ SOLN
INTRAMUSCULAR | Status: AC
  Filled 2018-11-17: qty 2

## 2018-11-17 MED ORDER — FENTANYL CITRATE (PF) 100 MCG/2ML IJ SOLN
INTRAMUSCULAR | Status: DC | PRN
  Administered 2018-11-17 (×2): 50 ug via INTRAVENOUS

## 2018-11-17 MED ORDER — LIDOCAINE HCL URETHRAL/MUCOSAL 2 % EX GEL
CUTANEOUS | Status: DC | PRN
  Administered 2018-11-17: 1 via URETHRAL

## 2018-11-17 MED ORDER — ACETAMINOPHEN 325 MG PO TABS: 325.0000 mg | ORAL_TABLET | ORAL | Status: DC | PRN

## 2018-11-17 MED ORDER — PROPOFOL 10 MG/ML IV BOLUS
INTRAVENOUS | Status: DC | PRN
  Administered 2018-11-17: 200 mg via INTRAVENOUS

## 2018-11-17 MED ORDER — SODIUM CHLORIDE (PF) 0.9 % IJ SOLN
INTRAMUSCULAR | Status: AC
  Filled 2018-11-17: qty 10

## 2018-11-17 MED ORDER — EPHEDRINE SULFATE 50 MG/ML IJ SOLN
INTRAMUSCULAR | Status: AC
  Filled 2018-11-17: qty 1

## 2018-11-17 MED ORDER — DOCUSATE SODIUM 100 MG PO CAPS: 200.0000 mg | ORAL_CAPSULE | Freq: Two times a day (BID) | ORAL | 3 refills | Status: DC

## 2018-11-17 MED ORDER — CEFAZOLIN SODIUM-DEXTROSE 1-4 GM/50ML-% IV SOLN
INTRAVENOUS | Status: AC
  Filled 2018-11-17: qty 50

## 2018-11-17 MED ORDER — PROPOFOL 10 MG/ML IV BOLUS
INTRAVENOUS | Status: AC
  Filled 2018-11-17: qty 20

## 2018-11-17 MED ORDER — BELLADONNA ALKALOIDS-OPIUM 16.2-60 MG RE SUPP
RECTAL | Status: DC | PRN
  Administered 2018-11-17: 1 via RECTAL

## 2018-11-17 MED ORDER — CIPROFLOXACIN HCL 500 MG PO TABS: 500.0000 mg | ORAL_TABLET | Freq: Two times a day (BID) | ORAL | 0 refills | Status: DC

## 2018-11-17 MED ORDER — DEXAMETHASONE SODIUM PHOSPHATE 4 MG/ML IJ SOLN
INTRAMUSCULAR | Status: AC
  Filled 2018-11-17: qty 1

## 2018-11-17 MED ORDER — DEXAMETHASONE SODIUM PHOSPHATE 10 MG/ML IJ SOLN
INTRAMUSCULAR | Status: DC | PRN
  Administered 2018-11-17: 5 mg via INTRAVENOUS

## 2018-11-17 MED ORDER — FENTANYL CITRATE (PF) 100 MCG/2ML IJ SOLN
INTRAMUSCULAR | Status: AC
  Filled 2018-11-17: qty 2

## 2018-11-17 MED ORDER — PROMETHAZINE HCL 25 MG/ML IJ SOLN
6.2500 mg | INTRAMUSCULAR | Status: DC | PRN
  Administered 2018-11-17: 6.25 mg via INTRAVENOUS

## 2018-11-17 SURGICAL SUPPLY — 24 items
ADAPTER IRRIG TUBE 2 SPIKE SOL (ADAPTER) ×4 IMPLANT
BAG URINE DRAINAGE (UROLOGICAL SUPPLIES) ×2 IMPLANT
CATH FOLEY 2WAY  5CC 20FR SIL (CATHETERS)
CATH FOLEY 2WAY 5CC 20FR SIL (CATHETERS) IMPLANT
FEE EXTENDED HOUR USAGE LASER (MISCELLANEOUS) ×2 IMPLANT
GLOVE BIO SURGEON STRL SZ7.5 (GLOVE) ×2 IMPLANT
GOWN STRL REUS W/ TWL LRG LVL3 (GOWN DISPOSABLE) ×1 IMPLANT
GOWN STRL REUS W/ TWL XL LVL3 (GOWN DISPOSABLE) ×1 IMPLANT
GOWN STRL REUS W/TWL LRG LVL3 (GOWN DISPOSABLE) ×1
GOWN STRL REUS W/TWL XL LVL3 (GOWN DISPOSABLE) ×1
IV NS 1000ML (IV SOLUTION) ×1
IV NS 1000ML BAXH (IV SOLUTION) ×1 IMPLANT
IV SET PRIMARY 15D 139IN B9900 (IV SETS) ×2 IMPLANT
KIT TURNOVER CYSTO (KITS) ×2 IMPLANT
LASER GREENLIGHT XPS PROCEDURE (MISCELLANEOUS) ×1 IMPLANT
LASER GRNLGT MOXY FIBER 750UM (MISCELLANEOUS) ×1 IMPLANT
LASER XPS ACCESS DROP OFF FEE (MISCELLANEOUS) ×1 IMPLANT
PACK CYSTO AR (MISCELLANEOUS) ×2 IMPLANT
SET IRRIG Y TYPE TUR BLADDER L (SET/KITS/TRAYS/PACK) ×2 IMPLANT
SOL .9 NS 3000ML IRR  AL (IV SOLUTION) ×8
SOL .9 NS 3000ML IRR UROMATIC (IV SOLUTION) ×4 IMPLANT
SURGILUBE 2OZ TUBE FLIPTOP (MISCELLANEOUS) ×2 IMPLANT
SYRINGE IRR TOOMEY STRL 70CC (SYRINGE) ×2 IMPLANT
WATER STERILE IRR 1000ML POUR (IV SOLUTION) ×2 IMPLANT

## 2018-11-17 NOTE — Op Note (Signed)
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 Yves Dill MD  Anesthesia: General  Indications:See the history and physical. After informed consent the above procedure(s) were requested     Technique and findings: After adequate general anesthesia had been obtained the patient was placed into dorsal lithotomy position and the perineum was prepped and draped in the usual fashion.  The laser scope was then coupled to the camera and visually advanced into the bladder.  The bladder was mildly trabeculated and no bladder tumors were identified.  Both ureteral orifices identified and had clear efflux.  At this point the light XPS laser fiber was introduced through the scope and set at 28 W of power. The bladder neck tissue was then vaporized.  The power was then increased to 120 W and obstructive tissue from the bladder neck to the verumontanum was vaporized.  Power was further increased to 180 W and remaining obstructive tissue vaporized.  Bleeders were attended to with the coagulative setting.  The bladder was drained and the cystoscope was removed.  10 cc of viscous Xylocaine was instilled within the urethra and the bladder.  A 20 French silicone catheter was placed and irrigated until clear  The procedure was then terminated and patient transferred to the recovery room in stable condition.  Blood loss was 50 cc.

## 2018-11-17 NOTE — H&P (Signed)
Date of Initial H&P: 10/20/2018  History reviewed, patient examined, no change in status, stable for surgery.

## 2018-11-17 NOTE — Anesthesia Preprocedure Evaluation (Addendum)
Anesthesia Evaluation  Patient identified by MRN, date of birth, ID band Patient awake    Reviewed: Allergy & Precautions, H&P , NPO status , Patient's Chart, lab work & pertinent test results  History of Anesthesia Complications Negative for: history of anesthetic complications  Airway Mallampati: III  TM Distance: <3 FB Neck ROM: limited    Dental  (+) Poor Dentition, Missing, Upper Dentures   Pulmonary shortness of breath and with exertion, COPD,  COPD inhaler, Current Smoker, former smoker,    Pulmonary exam normal breath sounds clear to auscultation       Cardiovascular Exercise Tolerance: Good hypertension, (-) angina+ DOE  (-) Past MI Normal cardiovascular exam Rhythm:regular Rate:Normal     Neuro/Psych Parkinson's Dz  Neuromuscular disease negative psych ROS   GI/Hepatic PUD, GERD  Controlled,(+) Hepatitis -, B  Endo/Other  negative endocrine ROS  Renal/GU Renal disease  negative genitourinary   Musculoskeletal  (+) Arthritis ,   Abdominal   Peds negative pediatric ROS (+)  Hematology negative hematology ROS (+)   Anesthesia Other Findings Past Medical History:   COPD (chronic obstructive pulmonary disease) (*              GERD (gastroesophageal reflux disease)                       Hypertension                                                 Gastric ulcer                                                Prostate hypertrophy                                         Rotator cuff tear                               right       Past Surgical History:   TONSILLECTOMY                                                 CHOLECYSTECTOMY                                              BMI    Body Mass Index   23.67 kg/m 2      Reproductive/Obstetrics negative OB ROS                            Anesthesia Physical  Anesthesia Plan  ASA: III  Anesthesia Plan: General   Post-op Pain  Management:    Induction: Intravenous  PONV Risk Score and Plan:   Airway Management Planned: Oral ETT  Additional Equipment:  Intra-op Plan:   Post-operative Plan: Extubation in OR  Informed Consent: I have reviewed the patients History and Physical, chart, labs and discussed the procedure including the risks, benefits and alternatives for the proposed anesthesia with the patient or authorized representative who has indicated his/her understanding and acceptance.     Dental Advisory Given  Plan Discussed with: Anesthesiologist, CRNA and Surgeon  Anesthesia Plan Comments:         Anesthesia Quick Evaluation

## 2018-11-17 NOTE — Discharge Instructions (Signed)
Benign Prostatic Hyperplasia  Benign prostatic hyperplasia (BPH) is an enlarged prostate gland that is caused by the normal aging process and not by cancer. The prostate is a walnut-sized gland that is involved in the production of semen. It is located in front of the rectum and below the bladder. The bladder stores urine and the urethra is the tube that carries the urine out of the body. The prostate may get bigger as a man gets older. An enlarged prostate can press on the urethra. This can make it harder to pass urine. The build-up of urine in the bladder can cause infection. Back pressure and infection may progress to bladder damage and kidney (renal) failure. What are the causes? This condition is part of a normal aging process. However, not all men develop problems from this condition. If the prostate enlarges away from the urethra, urine flow will not be blocked. If it enlarges toward the urethra and compresses it, there will be problems passing urine. What increases the risk? This condition is more likely to develop in men over the age of 75 years. What are the signs or symptoms? Symptoms of this condition include:  Getting up often during the night to urinate.  Needing to urinate frequently during the day.  Difficulty starting urine flow.  Decrease in size and strength of your urine stream.  Leaking (dribbling) after urinating.  Inability to pass urine. This needs immediate treatment.  Inability to completely empty your bladder.  Pain when you pass urine. This is more common if there is also an infection.  Urinary tract infection (UTI). How is this diagnosed? This condition is diagnosed based on your medical history, a physical exam, and your symptoms. Tests will also be done, such as:  A post-void bladder scan. This measures any amount of urine that may remain in your bladder after you finish urinating.  A digital rectal exam. In a rectal exam, your health care provider  checks your prostate by putting a lubricated, gloved finger into your rectum to feel the back of your prostate gland. This exam detects the size of your gland and any abnormal lumps or growths.  An exam of your urine (urinalysis).  A prostate specific antigen (PSA) screening. This is a blood test used to screen for prostate cancer.  An ultrasound. This test uses sound waves to electronically produce a picture of your prostate gland. Your health care provider may refer you to a specialist in kidney and prostate diseases (urologist). How is this treated? Once symptoms begin, your health care provider will monitor your condition (active surveillance or watchful waiting). Treatment for this condition will depend on the severity of your condition. Treatment may include:  Observation and yearly exams. This may be the only treatment needed if your condition and symptoms are mild.  Medicines to relieve your symptoms, including: ? Medicines to shrink the prostate. ? Medicines to relax the muscle of the prostate.  Surgery in severe cases. Surgery may include: ? Prostatectomy. In this procedure, the prostate tissue is removed completely through an open incision or with a laparoscope or robotics. ? Transurethral resection of the prostate (TURP). In this procedure, a tool is inserted through the opening at the tip of the penis (urethra). It is used to cut away tissue of the inner core of the prostate. The pieces are removed through the same opening of the penis. This removes the blockage. ? Transurethral incision (TUIP). In this procedure, small cuts are made in the prostate. This lessens  the prostate's pressure on the urethra. ? Transurethral microwave thermotherapy (TUMT). This procedure uses microwaves to create heat. The heat destroys and removes a small amount of prostate tissue. ? Transurethral needle ablation (TUNA). This procedure uses radio frequencies to destroy and remove a small amount of  prostate tissue. ? Interstitial laser coagulation ([place]). This procedure uses a laser to destroy and remove a small amount of prostate tissue. ? Transurethral electrovaporization (TUVP). This procedure uses electrodes to destroy and remove a small amount of prostate tissue. ? Prostatic urethral lift. This procedure inserts an implant to push the lobes of the prostate away from the urethra. Follow these instructions at home:  Take over-the-counter and prescription medicines only as told by your health care provider.  Monitor your symptoms for any changes. Contact your health care provider with any changes.  Avoid drinking large amounts of liquid before going to bed or out in public.  Avoid or reduce how much caffeine or alcohol you drink.  Give yourself time when you urinate.  Keep all follow-up visits as told by your health care provider. This is important. Contact a health care provider if:  You have unexplained back pain.  Your symptoms do not get better with treatment.  You develop side effects from the medicine you are taking.  Your urine becomes very dark or has a bad smell.  Your lower abdomen becomes distended and you have trouble passing your urine. Get help right away if:  You have a fever or chills.  You suddenly cannot urinate.  You feel lightheaded, or very dizzy, or you faint.  There are large amounts of blood or clots in the urine.  Your urinary problems become hard to manage.  You develop moderate to severe low back or flank pain. The flank is the side of your body between the ribs and the hip. These symptoms may represent a serious problem that is an emergency. Do not wait to see if the symptoms will go away. Get medical help right away. Call your local emergency services (911 in the [place] Do not drive yourself to the hospital. Summary  Benign prostatic hyperplasia (BPH) is an enlarged prostate that is caused by the normal aging process and not by  cancer.  An enlarged prostate can press on the urethra. This can make it hard to pass urine.  This condition is part of a normal aging process and is more likely to develop in men over the age of 54 years.  Get help right away if you suddenly cannot urinate. This information is not intended to replace advice given to you by your health care provider. Make sure you discuss any questions you have with your health care provider. Document Released: 04/29/2005 Document Revised: 03/24/2018 Document Reviewed: 06/03/2016 Elsevier Patient Education  2020 [place] Laser Prostate Treatment  Green light laser therapy is a procedure that uses a high-energy laser to get rid of extra prostate tissue by turning the tissue into a vapor. It is less invasive than traditional methods of prostate surgery, which involve cutting out the prostate tissue. Because the tissue is turned into a vapor (vaporized) rather than cut out, there is generally less blood loss. This surgery is used to treat an enlarged prostate gland (benign prostatic hyperplasia). Tell a health care provider about:  Any allergies you have.  All medicines you are taking, including vitamins, herbs, eye drops, creams, and over-the-counter medicines.  Any problems you or family members have had with anesthetic  medicines.  Any blood disorders you have.  Any surgeries you have had.  Any medical conditions you have. What are the risks? Generally, this is a safe procedure. However, problems may occur, including:  Infection.  Bleeding.  Allergic reaction to medicines.  Damage to other structures or organs.  Blood in the urine (hematuria).  Painful urination.  Urinary tract infection.  Erectile dysfunction (rare).  Dry ejaculation.  Scar tissue in the urinary passage. What happens before the procedure? Staying hydrated Follow instructions from your health care provider about hydration, which may  include:  Up to 2 hours before the procedure - you may continue to drink clear liquids, such as water, clear fruit juice, black coffee, and plain tea. Eating and drinking restrictions Follow instructions from your health care provider about eating and drinking, which may include:  8 hours before the procedure - stop eating heavy meals or foods such as meat, fried foods, or fatty foods.  6 hours before the procedure - stop eating light meals or foods, such as toast or cereal.  6 hours before the procedure - stop drinking milk or drinks that contain milk.  2 hours before the procedure - stop drinking clear liquids. Medicines  Ask your health care provider about: ? Changing or stopping your regular medicines. This is especially important if you are taking diabetes medicines or blood thinners. ? Taking medicines such as aspirin and ibuprofen. These medicines can thin your blood. Do not take these medicines before your procedure if your health care provider instructs you not to.  You may be prescribed antibiotic medicine. If so, take your antibiotic as told by your health care provider. Do not stop taking the antibiotic even if you start to feel better. General instructions  Plan to have someone take you home from the hospital or clinic.  If you will be going home right after the procedure, plan to have someone with you for 24 hours. What happens during the procedure?  To reduce your risk of infection: ? Your health care team will wash or sanitize their hands. ? Your skin will be washed with soap.  An IV will be inserted into one of your veins.  You will be given one or more of the following: ? A medicine to help you relax (sedative). ? A medicine to make you fall asleep (general anesthetic). ? A medicine that is injected into your spine to numb the area below and slightly above the injection site (spinal anesthetic).  A tube containing viewing scopes and instruments (fiber-optic  scope) will be passed through the urethra and into the prostate. The opening of the urethra is at the end of the penis.  A thin fiber will be put through the tube and positioned next to the extra prostate tissue.  Pulses of laser light will come from the end of the fiber and be projected onto the extra tissue. Your blood will absorb the light, become hot, and vaporize the extra prostate tissue.  The heat from the laser beam will seal off the blood vessels, which will lessen bleeding.  The fiber-optic scope will be removed.  A thin tube will be inserted into the urethra to drain your urine (urinary catheter). The procedure may vary among health care providers and hospitals. What happens after the procedure?  Your blood pressure, heart rate, breathing rate, and blood oxygen level will be monitored until the medicines you were given have worn off.  Depending on factors such as the amount of prostate  tissue that was vaporized, the strength of your bladder, and the amount of bleeding expected, your catheter may be removed before you go home.  You may be given elastic support stockings to wear to help prevent blood clots in your legs.  Do not drive for 24 hours if you were given a sedative, or for as long as directed by your health care provider. Summary  Green light laser therapy is a procedure that uses a high-energy laser that turns extra prostate tissue into a vapor.  This procedure is less invasive than traditional methods of prostate surgery.  Follow instructions from your health care provider about eating and drinking before the procedure.  Pulses of laser light will come from the end of a thin fiber and be aimed at the extra prostate tissue. Your blood will absorb the light, become hot, and vaporize the extra tissue. This information is not intended to replace advice given to you by your health care provider. Make sure you discuss any questions you have with your health care  provider. Document Released: 08/06/2007 Document Revised: 08/20/2018 Document Reviewed: 05/18/2016 Elsevier Patient Education  [place] After This sheet gives you information about how to care for yourself after your procedure. Your health care provider may also give you more specific instructions. If you have problems or questions, contact your health care provider. What can I expect after the procedure? After the procedure, it is common to have:  Swelling and discomfort around your urethra. The opening of the urethra is at the end of the penis.  Blood in your urine. This should go away after a few days.  Trouble urinating or sudden need to urinate (urgency). These problems should get better over time. You may continue to have a thin tube (catheter) inserted into your urethra to help drain your urine from your bladder for a few days after the procedure. Follow these instructions at home: Medicines  Take over-the-counter and prescription medicines only as told by your health care provider.  If you were prescribed an antibiotic medicine, take it as told by your health care provider. Do not stop taking the antibiotic even if you start to feel better. Bathing  Do not take baths, swim, or use a hot tub until your health care provider approves. Ask your health care provider if you may take showers. You may only be allowed to take sponge baths. Activity   Do not drive for 24 hours if you were given a medicine to help you relax (sedative) during your procedure.  Do not drive or use heavy machinery while taking prescription pain medicine.  Ask your health care provider what activities are safe for you. Most people can return to normal activities within a few days. ? Do not have sex or engage in sexual activity until your health care provider approves. ? Do not lift anything that is heavier than 10 lb (4.5 kg), or the limit that you are  told, until your health care provider says that it is safe. General instructions      If you have a urinary catheter, care for it as told by your health care provider. This may include: ? Washing your hands before and after touching the catheter. ? Emptying your drainage bag when it is ?- full, or emptying it at least 2-3 times a day. ? Keeping the area around the catheter clean and dry. ? Avoiding any bends or breaks in the catheter. ?  Keeping air out of the catheter. ? Making sure that the catheter is not placed under water.  Do not use any products that contain nicotine or tobacco, such as cigarettes and e-cigarettes. If you need help quitting, ask your health care provider.  Drink enough fluid to keep your urine pale yellow.  Keep all follow-up visits as told by your health care provider. This is important. Contact a health care provider if:  You have trouble: ? Having a bowel movement. ? Getting an erection.  You have swelling around your urethra and it gets worse.  You have blood in your urine for more than 2 days after the procedure.  You have pain or burning when you urinate, or other problems that do not go away or cause discomfort.  You have problems with your catheter or your catheter is blocked.  You have a fever.  You have nausea or you vomit.  You have swelling in your legs. Get help right away if:  Your urine has blood clots in it.  Your urine is dark red.  You cannot urinate after your catheter is removed.  You have blood in your stool.  You have severe pain that does not get better with medicine.  You have shortness of breath. Summary  After the procedure, it is common to have swelling and discomfort around your urethra and blood in your urine for a few days.  Some men may have problems urinating after this procedure. These problems should go away after a few days. If you have pain or burning while urinating, contact your health care  provider.  If you have a catheter after this procedure, care for it as told by your health care provider.  If you have severe pain, dark red urine, or urine with blood clots, get medical help right away. This information is not intended to replace advice given to you by your health care provider. Make sure you discuss any questions you have with your health care provider. Document Released: 11/07/2016 Document Revised: 08/20/2018 Document Reviewed: 11/07/2016 Elsevier Patient Education  [place].  Indwelling Urinary Catheter Insertion, Care After This sheet gives you information about how to care for yourself after your procedure. Your health care provider may also give you more specific instructions. If you have problems or questions, contact your health care provider. What can I expect after the procedure? After the procedure, it is common to have:  Slight discomfort around your urethra where the catheter enters your body. Follow these instructions at home:   Keep the drainage bag at or below the level of your bladder. Doing this ensures that urine can only drain out, not back into your body.  Secure the catheter tubing and drainage bag to your leg or thigh to keep it from moving.  Check the catheter tubing regularly to make sure there are no kinks or blockages.  Take showers daily to keep the catheter clean. Do not take a bath.  Do not pull on your catheter or try to remove it.  Disconnect the tubing and drainage bag as little as possible.  Empty the drainage bag every 2-4 hours, or more often if needed. Do not let the bag get completely full.  Wash your hands with soap and water before and after touching the catheter, tubing, or drainage bag.  Do not let the drainage bag or catheter tubing touch the floor.  Drink enough fluids to keep your urine clear or pale yellow, or as told by your  health care provider. Contact a health care provider if:  Urine stops flowing  into the drainage bag.  You feel pain or pressure in the bladder area.  You have back pain.  Your catheter gets clogged.  Your catheter starts to leak.  Your urine looks cloudy.  Your drainage bag or tubing looks dirty.  You notice a bad smell when emptying your drainage bag. Get help right away if:  You have a fever or chills.  You have severe pain in your back or your lower abdomen.  You have warmth, redness, swelling, or pain in the urethra area.  You notice blood in your urine.  Your catheter gets pulled out. Summary  Do not pull on your catheter or try to remove it.  Keep the drainage bag at or below the level of your bladder, but do not let the drainage bag or catheter tubing touch the floor.  Wash your hands with soap and water before and after touching the catheter, tubing, or drainage bag.  Contact your health care provider if you have a fever, chills, or any other signs of infection. This information is not intended to replace advice given to you by your health care provider. Make sure you discuss any questions you have with your health care provider. Document Released: 06/08/2016 Document Revised: 08/21/2018 Document Reviewed: 06/08/2016 Elsevier Patient Education  [place], Adult An indwelling urinary catheter is a thin tube that is put into your bladder. The tube helps to drain pee (urine) out of your body. The tube goes in through your urethra. Your urethra is where pee comes out of your body. Your pee will come out through the catheter, then it will go into a bag (drainage bag). Take good care of your catheter so it will work well. How to wear your catheter and bag Supplies needed  Sticky tape (adhesive tape) or a leg strap.  Alcohol wipe or soap and water (if you use tape).  A clean towel (if you use tape).  Large overnight bag.  Smaller bag (leg bag). Wearing your catheter Attach your catheter to your  leg with tape or a leg strap.  Make sure the catheter is not pulled tight.  If a leg strap gets wet, take it off and put on a dry strap.  If you use tape to hold the bag on your leg: 1. Use an alcohol wipe or soap and water to wash your skin where the tape made it sticky before. 2. Use a clean towel to pat-dry that skin. 3. Use new tape to make the bag stay on your leg. Wearing your bags You should have been given a large overnight bag.  You may wear the overnight bag in the day or night.  Always have the overnight bag lower than your bladder.  Do not let the bag touch the floor.  Before you go to sleep, put a clean plastic bag in a wastebasket. Then hang the overnight bag inside the wastebasket. You should also have a smaller leg bag that fits under your clothes.  Always wear the leg bag below your knee.  Do not wear your leg bag at night. How to care for your skin and catheter Supplies needed  A clean washcloth.  Water and mild soap.  A clean towel. Caring for your skin and catheter      Clean the skin around your catheter every day: ? Wash your hands with soap and water. ?  Wet a clean washcloth in warm water and mild soap. ? Clean the skin around your urethra. ? If you are male: ? Gently spread the folds of skin around your vagina (labia). ? With the washcloth in your other hand, wipe the inner side of your labia on each side. Wipe from front to back. ? If you are male: ? Pull back any skin that covers the end of your penis (foreskin). ? With the washcloth in your other hand, wipe your penis in small circles. Start wiping at the tip of your penis, then move away from the catheter. ? Move the foreskin back in place, if needed. ? With your free hand, hold the catheter close to where it goes into your body. ? Keep holding the catheter during cleaning so it does not get pulled out. ? With the washcloth in your other hand, clean the catheter. ? Only wipe downward on  the catheter. ? Do not wipe upward toward your body. Doing this may push germs into your urethra and cause infection. ? Use a clean towel to pat-dry the catheter and the skin around it. Make sure to wipe off all soap. ? Wash your hands with soap and water.  Shower every day. Do not take baths.  Do not use cream, ointment, or lotion on the area where the catheter goes into your body, unless your doctor tells you to.  Do not use powders, sprays, or lotions on your genital area.  Check your skin around the catheter every day for signs of infection. Check for: ? Redness, swelling, or pain. ? Fluid or blood. ? Warmth. ? Pus or a bad smell. How to empty the bag Supplies needed  Rubbing alcohol.  Gauze pad or cotton ball.  Tape or a leg strap. Emptying the bag Pour the pee out of your bag when it is ?- full, or at least 2-3 times a day. Do this for your overnight bag and your leg bag. 1. Wash your hands with soap and water. 2. Separate (detach) the bag from your leg. 3. Hold the bag over the toilet or a clean pail. Keep the bag lower than your hips and bladder. This is so the pee (urine) does not go back into the tube. 4. Open the pour spout. It is at the bottom of the bag. 5. Empty the pee into the toilet or pail. Do not let the pour spout touch any surface. 6. Put rubbing alcohol on a gauze pad or cotton ball. 7. Use the gauze pad or cotton ball to clean the pour spout. 8. Close the pour spout. 9. Attach the bag to your leg with tape or a leg strap. 10. Wash your hands with soap and water. Follow instructions for cleaning the drainage bag:  From the product maker.  As told by your doctor. How to change the bag Supplies needed  Alcohol wipes.  A clean bag.  Tape or a leg strap. Changing the bag Replace your bag when it starts to leak, smell bad, or look dirty. 1. Wash your hands with soap and water. 2. Separate the dirty bag from your leg. 3. Pinch the catheter with  your fingers so that pee does not spill out. 4. Separate the catheter tube from the bag tube where these tubes connect (at the connection valve). Do not let the tubes touch any surface. 5. Clean the end of the catheter tube with an alcohol wipe. Use a different alcohol wipe to clean the end of  the bag tube. 6. Connect the catheter tube to the tube of the clean bag. 7. Attach the clean bag to your leg with tape or a leg strap. Do not make the bag tight on your leg. 8. Wash your hands with soap and water. General rules   Never pull on your catheter. Never try to take it out. Doing that can hurt you.  Always wash your hands before and after you touch your catheter or bag. Use a mild, fragrance-free soap. If you do not have soap and water, use hand sanitizer.  Always make sure there are no twists or bends (kinks) in the catheter tube.  Always make sure there are no leaks in the catheter or bag.  Drink enough fluid to keep your pee pale yellow.  Do not take baths, swim, or use a hot tub.  If you are male, wipe from front to back after you poop (have a bowel movement). Contact a doctor if:  Your pee is cloudy.  Your pee smells worse than usual.  Your catheter gets clogged.  Your catheter leaks.  Your bladder feels full. Get help right away if:  You have redness, swelling, or pain where the catheter goes into your body.  You have fluid, blood, pus, or a bad smell coming from the area where the catheter goes into your body.  Your skin feels warm where the catheter goes into your body.  You have a fever.  You have pain in your: ? Belly (abdomen). ? Legs. ? Lower back. ? Bladder.  You see blood in the catheter.  Your pee is pink or red.  You feel sick to your stomach (nauseous).  You throw up (vomit).  You have chills.  Your pee is not draining into the bag.  Your catheter gets pulled out. Summary  An indwelling urinary catheter is a thin tube that is placed  into the bladder to help drain pee (urine) out of the body.  The catheter is placed into the part of the body that drains pee from the bladder (urethra).  Taking good care of your catheter will keep it working properly and help prevent problems.  Always wash your hands before and after touching your catheter or bag.  Never pull on your catheter or try to take it out. This information is not intended to replace advice given to you by your health care provider. Make sure you discuss any questions you have with your health care provider. Document Released: 08/24/2012 Document Revised: 08/21/2018 Document Reviewed: 12/13/2016 Elsevier Patient Education  [place]   1) The drugs that you were given will stay in your system until tomorrow so for the next 24 hours you should not:  A) Drive an automobile B) Make any legal decisions C) Drink any alcoholic beverage   2) You may resume regular meals tomorrow.  Today it is better to start with liquids and gradually work up to solid foods.  You may eat anything you prefer, but it is better to start with liquids, then soup and crackers, and gradually work up to solid foods.   3) Please notify your doctor immediately if you have any unusual bleeding, trouble breathing, redness and pain at the surgery site, drainage, fever, or pain not relieved by medication.    4) Additional Instructions:        Please contact your physician with any problems or Same Day Surgery at [phone], Monday through Friday  6 am to 4 pm, or [place] [facility] number at [phone].

## 2018-11-17 NOTE — Anesthesia Procedure Notes (Signed)
Procedure Name: Intubation Date/Time: 11/17/2018 1:13 PM Performed by: Caryl Asp, CRNA Pre-anesthesia Checklist: Patient identified, Patient being monitored, Timeout performed, Emergency Drugs available and Suction available Patient Re-evaluated:Patient Re-evaluated prior to induction Oxygen Delivery Method: Circle system utilized Preoxygenation: Pre-oxygenation with 100% oxygen Induction Type: IV induction Ventilation: Mask ventilation without difficulty Laryngoscope Size: McGraph and 4 Grade View: Grade I Tube type: Oral Tube size: 7.0 mm Number of attempts: 1 Airway Equipment and Method: Stylet Placement Confirmation: ETT inserted through vocal cords under direct vision,  positive ETCO2 and breath sounds checked- equal and bilateral Secured at: 22 cm Tube secured with: Tape Dental Injury: Teeth and Oropharynx as per pre-operative assessment

## 2018-11-17 NOTE — Anesthesia Post-op Follow-up Note (Signed)
Anesthesia QCDR form completed.        

## 2018-11-17 NOTE — Transfer of Care (Signed)
Immediate Anesthesia Transfer of Care Note  Patient: Patrick Montoya  Procedure(s) Performed: GREEN LIGHT LASER TURP (TRANSURETHRAL RESECTION OF PROSTATE (N/A Prostate)  Patient Location: PACU  Anesthesia Type:General  Level of Consciousness: awake, alert  and oriented  Airway & Oxygen Therapy: Patient Spontanous Breathing and Patient connected to face mask oxygen  Post-op Assessment: Report given to RN and Post -op Vital signs reviewed and stable  Post vital signs: Reviewed and stable  Last Vitals:  Vitals Value Taken Time  BP 136/81 11/17/18 1521  Temp 36.2 C 11/17/18 1520  Pulse 80 11/17/18 1520  Resp 20 11/17/18 1520  SpO2 99 % 11/17/18 1520  Vitals shown include unvalidated device data.  Last Pain:  Vitals:   11/17/18 1520  TempSrc:   PainSc: 0-No pain         Complications: No apparent anesthesia complications

## 2018-11-18 ENCOUNTER — Encounter: Payer: Self-pay | Admitting: Urology

## 2018-11-19 NOTE — Anesthesia Postprocedure Evaluation (Signed)
Anesthesia Post Note  Patient: ARIF AMENDOLA  Procedure(s) Performed: GREEN LIGHT LASER TURP (TRANSURETHRAL RESECTION OF PROSTATE (N/A Prostate)  Patient location during evaluation: PACU Anesthesia Type: General Level of consciousness: awake and alert Pain management: pain level controlled Vital Signs Assessment: post-procedure vital signs reviewed and stable Respiratory status: spontaneous breathing, nonlabored ventilation and respiratory function stable Cardiovascular status: blood pressure returned to baseline and stable Postop Assessment: no apparent nausea or vomiting Anesthetic complications: no     Last Vitals:  Vitals:   11/17/18 1602 11/17/18 1654  BP: (!) 163/94 135/89  Pulse: 89 97  Resp: 16 16  Temp: (!) 36.2 C   SpO2: 94% 96%    Last Pain:  Vitals:   11/18/18 0854  TempSrc:   PainSc: 0-No pain                 Alphonsus Sias
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
## 2019-01-27 ENCOUNTER — Ambulatory Visit: Payer: Medicare Other

## 2019-02-01 ENCOUNTER — Ambulatory Visit: Payer: Medicare Other

## 2019-02-03 ENCOUNTER — Ambulatory Visit: Payer: Medicare Other

## 2019-02-08 ENCOUNTER — Ambulatory Visit: Payer: Medicare Other

## 2019-02-10 ENCOUNTER — Inpatient Hospital Stay: Admission: RE | Admit: 2019-02-10 | Payer: Medicare Other | Source: Ambulatory Visit

## 2019-02-10 ENCOUNTER — Ambulatory Visit: Payer: Medicare Other

## 2019-02-11 ENCOUNTER — Other Ambulatory Visit: Payer: Self-pay

## 2019-02-11 ENCOUNTER — Encounter
Admission: RE | Admit: 2019-02-11 | Discharge: 2019-02-11 | Disposition: A | Discharge status: Home / Self Care | Payer: Medicare Other | Source: Ambulatory Visit | Attending: Urology | Admitting: Urology

## 2019-02-11 MED ORDER — FAMOTIDINE 20 MG PO TABS
20.0000 mg | ORAL_TABLET | Freq: Once | ORAL | Status: DC
  Filled 2019-02-11: qty 1

## 2019-02-11 NOTE — Patient Instructions (Signed)
Your procedure is scheduled on: February 18, 2019 Thursday  Report to Day Surgery on the 2nd floor of the [facility]. To find out your arrival time, please call [phone] between 1PM - 3PM on: Wednesday February 17, 2019  REMEMBER: Instructions that are not followed completely may result in serious medical risk, up to and including death; or upon the discretion of your surgeon and anesthesiologist your surgery may need to be rescheduled.  Do not eat food after midnight the night before surgery.  No gum chewing, lozengers or hard candies.  You may however, drink CLEAR liquids up to 2 hours before you are scheduled to arrive for your surgery. Do not drink anything within 2 hours of the start of your surgery.  Clear liquids include: - water  - apple juice without pulp - gatorade - black coffee or tea (Do NOT add milk or creamers to the coffee or tea) Do NOT drink anything that is not on this list.  Type 1 and Type 2 diabetics should only drink water.  No Alcohol for 24 hours before or after surgery.  No Smoking including e-cigarettes for 24 hours prior to surgery.  No chewable tobacco products for at least 6 hours prior to surgery.  No nicotine patches on the day of surgery.  On the morning of surgery brush your teeth with toothpaste and water, you may rinse your mouth with mouthwash if you wish. Do not swallow any toothpaste or mouthwash.  Notify your doctor if there is any change in your medical condition (cold, fever, infection).  Do not wear jewelry, make-up, hairpins, clips or nail polish.  Do not wear lotions, powders, or perfumes.   Do not shave 48 hours prior to surgery.   Contacts and dentures may not be worn into surgery.  Do not bring valuables to the hospital, including drivers license, insurance or credit cards.  [facility] is not responsible for any belongings or valuables.   TAKE THESE MEDICATIONS THE MORNING OF  SURGERY: CARBIDOPA-LEVODOPA AMLODIPINE OMEPRAZOLE (take one the night before and one on the morning of surgery - helps to prevent nausea after surgery.) DOXAZOSIN  USE INHALER AND NEBULIZER  Use CHG Soap as directed on instruction sheet.  Follow recommendations from Cardiologist, Pulmonologist or PCP regarding stopping Aspirin, Coumadin, Plavix, Eliquis, Pradaxa, or Pletal.  Stop Anti-inflammatories (NSAIDS) such as Advil, Aleve, Ibuprofen, Motrin, Naproxen, Naprosyn and Aspirin based products such as Excedrin, Goodys Powder, BC Powder. (May take Tylenol or Acetaminophen if needed.)  Stop ANY OVER THE COUNTER supplements until after surgery. (May continue Vitamin D, Vitamin B, and multivitamin.)  Wear comfortable clothing (specific to your surgery type) to the hospital.  Plan for stool softeners for home use.  If you are being discharged the day of surgery, you will not be allowed to drive home. You will need a responsible adult to drive you home and stay with you that night.   If you are taking public transportation, you will need to have a responsible adult with you. Please confirm with your physician that it is acceptable to use public transportation.   Please call [phone] if you have any questions about these instructions.
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
## 2019-02-15 ENCOUNTER — Other Ambulatory Visit
Admission: RE | Admit: 2019-02-15 | Discharge: 2019-02-15 | Disposition: A | Discharge status: Home / Self Care | Payer: Medicare Other | Source: Ambulatory Visit | Attending: Urology | Admitting: Urology

## 2019-02-15 ENCOUNTER — Ambulatory Visit: Payer: Medicare Other

## 2019-02-15 DIAGNOSIS — Z20828 Contact with and (suspected) exposure to other viral communicable diseases: Secondary | ICD-10-CM | POA: Insufficient documentation

## 2019-02-15 DIAGNOSIS — Z01812 Encounter for preprocedural laboratory examination: Secondary | ICD-10-CM | POA: Insufficient documentation

## 2019-02-15 LAB — SARS CORONAVIRUS 2 (TAT 6-24 HRS): SARS Coronavirus 2: NEGATIVE

## 2019-02-18 ENCOUNTER — Encounter: Payer: Self-pay | Admitting: *Deleted

## 2019-02-18 ENCOUNTER — Encounter
Admission: RE | Disposition: A | Discharge status: Home / Self Care | Payer: Self-pay | Source: Home / Self Care | Attending: Urology

## 2019-02-18 ENCOUNTER — Ambulatory Visit
Admission: RE | Admit: 2019-02-18 | Discharge: 2019-02-18 | Disposition: A | Discharge status: Home / Self Care | Payer: Medicare Other | Attending: Urology | Admitting: Urology

## 2019-02-18 ENCOUNTER — Ambulatory Visit: Payer: Medicare Other

## 2019-02-18 ENCOUNTER — Other Ambulatory Visit: Payer: Self-pay

## 2019-02-18 DIAGNOSIS — K4090 Unilateral inguinal hernia, without obstruction or gangrene, not specified as recurrent: Secondary | ICD-10-CM | POA: Diagnosis present

## 2019-02-18 DIAGNOSIS — I10 Essential (primary) hypertension: Secondary | ICD-10-CM | POA: Diagnosis not present

## 2019-02-18 DIAGNOSIS — G709 Myoneural disorder, unspecified: Secondary | ICD-10-CM | POA: Insufficient documentation

## 2019-02-18 DIAGNOSIS — G20 Parkinson's disease: Secondary | ICD-10-CM | POA: Diagnosis not present

## 2019-02-18 DIAGNOSIS — Z8711 Personal history of peptic ulcer disease: Secondary | ICD-10-CM | POA: Insufficient documentation

## 2019-02-18 DIAGNOSIS — Z87891 Personal history of nicotine dependence: Secondary | ICD-10-CM | POA: Diagnosis not present

## 2019-02-18 DIAGNOSIS — Z79899 Other long term (current) drug therapy: Secondary | ICD-10-CM | POA: Diagnosis not present

## 2019-02-18 DIAGNOSIS — J449 Chronic obstructive pulmonary disease, unspecified: Secondary | ICD-10-CM | POA: Diagnosis not present

## 2019-02-18 DIAGNOSIS — K219 Gastro-esophageal reflux disease without esophagitis: Secondary | ICD-10-CM | POA: Diagnosis not present

## 2019-02-18 HISTORY — PX: INSERTION OF MESH: SHX5868

## 2019-02-18 HISTORY — PX: INGUINAL HERNIA REPAIR: SHX194

## 2019-02-18 SURGERY — REPAIR, HERNIA, INGUINAL, ADULT
Anesthesia: General | Laterality: Right

## 2019-02-18 MED ORDER — MIDAZOLAM HCL 2 MG/2ML IJ SOLN
INTRAMUSCULAR | Status: DC | PRN
  Administered 2019-02-18: 2 mg via INTRAVENOUS

## 2019-02-18 MED ORDER — PHENYLEPHRINE HCL (PRESSORS) 10 MG/ML IV SOLN
INTRAVENOUS | Status: DC | PRN
  Administered 2019-02-18: 100 ug via INTRAVENOUS

## 2019-02-18 MED ORDER — DEXAMETHASONE SODIUM PHOSPHATE 10 MG/ML IJ SOLN
INTRAMUSCULAR | Status: AC
  Filled 2019-02-18: qty 1

## 2019-02-18 MED ORDER — IPRATROPIUM-ALBUTEROL 0.5-2.5 (3) MG/3ML IN SOLN
3.0000 mL | Freq: Once | RESPIRATORY_TRACT | Status: AC
  Administered 2019-02-18: 3 mL via RESPIRATORY_TRACT

## 2019-02-18 MED ORDER — FENTANYL CITRATE (PF) 100 MCG/2ML IJ SOLN
INTRAMUSCULAR | Status: AC
  Administered 2019-02-18: 25 ug via INTRAVENOUS
  Filled 2019-02-18: qty 2

## 2019-02-18 MED ORDER — ONDANSETRON HCL 4 MG/2ML IJ SOLN
INTRAMUSCULAR | Status: AC
  Filled 2019-02-18: qty 2

## 2019-02-18 MED ORDER — FENTANYL CITRATE (PF) 100 MCG/2ML IJ SOLN
25.0000 ug | INTRAMUSCULAR | Status: AC | PRN
  Administered 2019-02-18 (×6): 25 ug via INTRAVENOUS

## 2019-02-18 MED ORDER — PROPOFOL 10 MG/ML IV BOLUS
INTRAVENOUS | Status: DC | PRN
  Administered 2019-02-18: 140 mg via INTRAVENOUS

## 2019-02-18 MED ORDER — BUPIVACAINE HCL 0.5 % IJ SOLN
INTRAMUSCULAR | Status: DC | PRN
  Administered 2019-02-18: 10 mL

## 2019-02-18 MED ORDER — IPRATROPIUM-ALBUTEROL 0.5-2.5 (3) MG/3ML IN SOLN
RESPIRATORY_TRACT | Status: AC
  Filled 2019-02-18: qty 3

## 2019-02-18 MED ORDER — PROPOFOL 10 MG/ML IV BOLUS
INTRAVENOUS | Status: AC
  Filled 2019-02-18: qty 40

## 2019-02-18 MED ORDER — CEFAZOLIN SODIUM-DEXTROSE 2-4 GM/100ML-% IV SOLN
INTRAVENOUS | Status: AC
  Filled 2019-02-18: qty 100

## 2019-02-18 MED ORDER — CEFAZOLIN SODIUM-DEXTROSE 2-4 GM/100ML-% IV SOLN
2.0000 g | Freq: Three times a day (TID) | INTRAVENOUS | Status: DC
  Administered 2019-02-18: 2 g via INTRAVENOUS

## 2019-02-18 MED ORDER — BUPIVACAINE HCL (PF) 0.5 % IJ SOLN
INTRAMUSCULAR | Status: AC
  Filled 2019-02-18: qty 30

## 2019-02-18 MED ORDER — LIDOCAINE HCL (CARDIAC) PF 100 MG/5ML IV SOSY
PREFILLED_SYRINGE | INTRAVENOUS | Status: DC | PRN
  Administered 2019-02-18: 60 mg via INTRAVENOUS

## 2019-02-18 MED ORDER — SUMATRIPTAN SUCCINATE 50 MG PO TABS
50.0000 mg | ORAL_TABLET | Freq: Once | ORAL | Status: AC
  Administered 2019-02-18: 50 mg via ORAL
  Filled 2019-02-18: qty 1

## 2019-02-18 MED ORDER — EPHEDRINE SULFATE 50 MG/ML IJ SOLN
INTRAMUSCULAR | Status: AC
  Filled 2019-02-18: qty 1

## 2019-02-18 MED ORDER — EPHEDRINE SULFATE 50 MG/ML IJ SOLN
INTRAMUSCULAR | Status: DC | PRN
  Administered 2019-02-18: 10 mg via INTRAVENOUS

## 2019-02-18 MED ORDER — CEPHALEXIN 500 MG PO CAPS: 500.0000 mg | ORAL_CAPSULE | Freq: Four times a day (QID) | ORAL | 1 refills | Status: DC

## 2019-02-18 MED ORDER — DEXAMETHASONE SODIUM PHOSPHATE 10 MG/ML IJ SOLN
INTRAMUSCULAR | Status: DC | PRN
  Administered 2019-02-18: 4 mg via INTRAVENOUS

## 2019-02-18 MED ORDER — LIDOCAINE-EPINEPHRINE 1 %-1:100000 IJ SOLN
INTRAMUSCULAR | Status: AC
  Filled 2019-02-18: qty 1

## 2019-02-18 MED ORDER — KETOROLAC TROMETHAMINE 30 MG/ML IJ SOLN
INTRAMUSCULAR | Status: DC | PRN
  Administered 2019-02-18: 15 mg via INTRAVENOUS

## 2019-02-18 MED ORDER — FENTANYL CITRATE (PF) 100 MCG/2ML IJ SOLN
INTRAMUSCULAR | Status: AC
  Filled 2019-02-18: qty 2

## 2019-02-18 MED ORDER — NEOMYCIN-POLYMYXIN B GU 40-200000 IR SOLN
Status: DC | PRN
  Administered 2019-02-18: 2 mL

## 2019-02-18 MED ORDER — SUCCINYLCHOLINE CHLORIDE 20 MG/ML IJ SOLN
INTRAMUSCULAR | Status: DC | PRN
  Administered 2019-02-18: 140 mg via INTRAVENOUS

## 2019-02-18 MED ORDER — MIDAZOLAM HCL 2 MG/2ML IJ SOLN
INTRAMUSCULAR | Status: AC
  Filled 2019-02-18: qty 2

## 2019-02-18 MED ORDER — LACTATED RINGERS IV SOLN
INTRAVENOUS | Status: DC
  Administered 2019-02-18 (×2): via INTRAVENOUS

## 2019-02-18 MED ORDER — LIDOCAINE HCL (PF) 2 % IJ SOLN
INTRAMUSCULAR | Status: AC
  Filled 2019-02-18: qty 10

## 2019-02-18 MED ORDER — NEOMYCIN-POLYMYXIN B GU 40-200000 IR SOLN
Status: AC
  Filled 2019-02-18: qty 2

## 2019-02-18 MED ORDER — ONDANSETRON HCL 4 MG/2ML IJ SOLN: 4.0000 mg | Freq: Once | INTRAMUSCULAR | Status: DC | PRN

## 2019-02-18 MED ORDER — ROCURONIUM BROMIDE 50 MG/5ML IV SOLN
INTRAVENOUS | Status: AC
  Filled 2019-02-18: qty 1

## 2019-02-18 MED ORDER — ROCURONIUM BROMIDE 100 MG/10ML IV SOLN
INTRAVENOUS | Status: DC | PRN
  Administered 2019-02-18: 10 mg via INTRAVENOUS
  Administered 2019-02-18: 15 mg via INTRAVENOUS
  Administered 2019-02-18: 5 mg via INTRAVENOUS

## 2019-02-18 MED ORDER — SUGAMMADEX SODIUM 500 MG/5ML IV SOLN
INTRAVENOUS | Status: AC
  Filled 2019-02-18: qty 5

## 2019-02-18 MED ORDER — SUGAMMADEX SODIUM 200 MG/2ML IV SOLN
INTRAVENOUS | Status: DC | PRN
  Administered 2019-02-18: 200 mg via INTRAVENOUS

## 2019-02-18 MED ORDER — FENTANYL CITRATE (PF) 100 MCG/2ML IJ SOLN
INTRAMUSCULAR | Status: DC | PRN
  Administered 2019-02-18 (×2): 50 ug via INTRAVENOUS

## 2019-02-18 MED ORDER — NUCYNTA 50 MG PO TABS: 50.0000 mg | ORAL_TABLET | Freq: Four times a day (QID) | ORAL | 0 refills | Status: DC | PRN

## 2019-02-18 MED ORDER — DEXMEDETOMIDINE HCL 200 MCG/2ML IV SOLN
INTRAVENOUS | Status: DC | PRN
  Administered 2019-02-18 (×2): 4 ug via INTRAVENOUS

## 2019-02-18 MED ORDER — SUCCINYLCHOLINE CHLORIDE 20 MG/ML IJ SOLN
INTRAMUSCULAR | Status: AC
  Filled 2019-02-18: qty 1

## 2019-02-18 SURGICAL SUPPLY — 38 items
BLADE SURG 15 STRL LF DISP TIS (BLADE) ×2 IMPLANT
BLADE SURG 15 STRL SS (BLADE) ×1
CANISTER SUCT 1200ML W/VALVE (MISCELLANEOUS) ×3 IMPLANT
CHLORAPREP W/TINT 26 (MISCELLANEOUS) ×1 IMPLANT
COVER WAND RF STERILE (DRAPES) ×3 IMPLANT
DERMABOND ADVANCED (GAUZE/BANDAGES/DRESSINGS) ×1
DERMABOND ADVANCED .7 DNX12 (GAUZE/BANDAGES/DRESSINGS) ×2 IMPLANT
DRAIN PENROSE 1/4X12 LTX STRL (WOUND CARE) ×3 IMPLANT
DRAPE LAPAROTOMY 100X77 ABD (DRAPES) ×3 IMPLANT
DRSG TEGADERM 4X4.75 (GAUZE/BANDAGES/DRESSINGS) ×1 IMPLANT
DRSG TELFA 4X3 1S NADH ST (GAUZE/BANDAGES/DRESSINGS) ×1 IMPLANT
ELECT REM PT RETURN 9FT ADLT (ELECTROSURGICAL) ×3
ELECTRODE REM PT RTRN 9FT ADLT (ELECTROSURGICAL) ×2 IMPLANT
GLOVE BIO SURGEON STRL SZ7.5 (GLOVE) ×3 IMPLANT
GOWN STRL REUS W/ TWL LRG LVL3 (GOWN DISPOSABLE) ×2 IMPLANT
GOWN STRL REUS W/ TWL XL LVL3 (GOWN DISPOSABLE) ×2 IMPLANT
GOWN STRL REUS W/TWL LRG LVL3 (GOWN DISPOSABLE) ×1
GOWN STRL REUS W/TWL XL LVL3 (GOWN DISPOSABLE) ×1
KIT TURNOVER KIT A (KITS) ×3 IMPLANT
LABEL OR SOLS (LABEL) ×3 IMPLANT
MESH HERNIA 4.5X10 SYS PRO LRG (Mesh General) ×1 IMPLANT
MESH HERNIA SYS PROLENE LG (Mesh General) ×1 IMPLANT
NDL HYPO 25X1 1.5 SAFETY (NEEDLE) ×1 IMPLANT
NEEDLE HYPO 25X1 1.5 SAFETY (NEEDLE) ×3 IMPLANT
NS IRRIG 500ML POUR BTL (IV SOLUTION) ×3 IMPLANT
PACK BASIN MINOR ARMC (MISCELLANEOUS) ×3 IMPLANT
SPONGE KITTNER 5P (MISCELLANEOUS) ×3 IMPLANT
STRIP CLOSURE SKIN 1/2X4 (GAUZE/BANDAGES/DRESSINGS) ×3 IMPLANT
SUPPORT SCROTAL LG STRP (MISCELLANEOUS) ×3 IMPLANT
SUT CHROMIC 3-0 (SUTURE) ×1
SUT CHROMIC 3-0 54XMFL REEL CR (SUTURE) ×2
SUT PLAIN 3 0 SH 27IN (SUTURE) ×3 IMPLANT
SUT SURGILON 0 BLK (SUTURE) ×4 IMPLANT
SUT VIC AB 4-0 PS2 18 (SUTURE) ×3 IMPLANT
SUTURE CHRMC 3-0 54XMFL REL CR (SUTURE) ×2 IMPLANT
SWABSTK COMLB BENZOIN TINCTURE (MISCELLANEOUS) ×1 IMPLANT
SYR 10ML LL (SYRINGE) ×3 IMPLANT
SYR BULB IRRIG 60ML STRL (SYRINGE) ×3 IMPLANT

## 2019-02-18 NOTE — H&P (Signed)
Date of Initial H&P: 02/11/19  History reviewed, patient examined, no change in status, stable for surgery.

## 2019-02-18 NOTE — Op Note (Signed)
Preoperative diagnosis: Right inguinal hernia  Postoperative diagnosis: Same  Procedure: Right inguinal herniorrhaphy  Surgeon: Otelia Limes. Yves Dill MD  Anesthesia: General  Indications:See the history and physical. After informed consent the above procedure(s) were requested     Technique and findings: After adequate general anesthesia had been obtained the lower abdomen and perineum were prepped and draped in the usual fashion.  Hernia defect was palpated in the right inguinal region.  A right inguinal crease incision was made and carried down sharply through the skin and through the subcutaneous fatty tissue with electrocautery.  External oblique fascia was cleared of overlying fatty tissue using blunt dissection.  The external oblique fascia was then incised along the course of its fibers to fully expose the spermatic cord.  The spermatic cord was delivered into the surgical field and ported with a Penrose drain.  Medic cord fibers were divided and the hernia sac identified.  The hernia sac was then dissected back to the internal ring using blunt dissection.  Hernia sac was suture-ligated with 2-0 Surgilon suture and excess sac excised and discarded.  The ilioinguinal nerve was identified and carefully preserved.  The retropubic space was then developed using blunt finger dissection.  The retropubic space was irrigated with GU irrigant.  Ethicon PHS the graft was selected and the circular portion of the graft unfurled in the retropubic space.  The external oblong portion of the graft was then placed parallel to the external oblique fibers beneath the external oblique fibers.  A keyhole incision was made laterally into this portion of the graft and edges brought around the spermatic cord and anchored to the inguinal ligament with a 2-0 Surgilon suture.  The distal edge of the external portion of the graft was anchored to the pubic tubercle with a 2-0 Surgilon suture.  The surgical field was then again  irrigated with GU irrigant.  The spermatic cord was then placed back into its normal anatomic position and and external oblique fascia reapproximated with interrupted 2-0 Surgilon suture.  Subcutaneous block and spermatic cord block were performed with 0.25% Marcaine.  Skin was then closed with a 4-0 Vicryl subcutaneous closure.  Dermabond, benzoin, Steri-Strips and sterile dressing were applied.  Sponge, instrument, and needle counts were noted be correct.  Blood loss was minimal.  Patient was then transferred to the recovery room in stable condition.

## 2019-02-18 NOTE — Progress Notes (Signed)
Headache better  

## 2019-02-18 NOTE — Anesthesia Procedure Notes (Signed)
Procedure Name: Intubation Date/Time: 02/18/2019 8:47 AM Performed by: Cory Munch, RN Pre-anesthesia Checklist: Patient identified, Emergency Drugs available, Suction available and Patient being monitored Patient Re-evaluated:Patient Re-evaluated prior to induction Oxygen Delivery Method: Circle system utilized Preoxygenation: Pre-oxygenation with 100% oxygen Induction Type: IV induction and Rapid sequence Ventilation: Mask ventilation without difficulty Laryngoscope Size: McGraph and 4 Grade View: Grade I Tube type: Oral Tube size: 7.5 mm Number of attempts: 1 Airway Equipment and Method: Stylet and Oral airway Placement Confirmation: ETT inserted through vocal cords under direct vision,  positive ETCO2 and breath sounds checked- equal and bilateral Secured at: 22 cm Tube secured with: Tape Dental Injury: Teeth and Oropharynx as per pre-operative assessment

## 2019-02-18 NOTE — Anesthesia Preprocedure Evaluation (Signed)
Anesthesia Evaluation  Patient identified by MRN, date of birth, ID band Patient awake    Reviewed: Allergy & Precautions, H&P , NPO status , Patient's Chart, lab work & pertinent test results  History of Anesthesia Complications Negative for: history of anesthetic complications  Airway Mallampati: III  TM Distance: <3 FB Neck ROM: limited    Dental  (+) Poor Dentition, Missing, Upper Dentures   Pulmonary shortness of breath and with exertion, COPD,  COPD inhaler, Current Smoker, former smoker,    Pulmonary exam normal breath sounds clear to auscultation       Cardiovascular Exercise Tolerance: Good hypertension, (-) angina+ DOE  (-) Past MI Normal cardiovascular exam Rhythm:regular Rate:Normal     Neuro/Psych Parkinson's Dz  Neuromuscular disease negative psych ROS   GI/Hepatic PUD, GERD  Controlled,(+) Hepatitis -, B  Endo/Other  negative endocrine ROS  Renal/GU Renal disease  negative genitourinary   Musculoskeletal  (+) Arthritis ,   Abdominal   Peds negative pediatric ROS (+)  Hematology negative hematology ROS (+)   Anesthesia Other Findings Past Medical History:   COPD (chronic obstructive pulmonary disease) (*              GERD (gastroesophageal reflux disease)                       Hypertension                                                 Gastric ulcer                                                Prostate hypertrophy                                         Rotator cuff tear                               right       Past Surgical History:   TONSILLECTOMY                                                 CHOLECYSTECTOMY                                              BMI    Body Mass Index   23.67 kg/m 2      Reproductive/Obstetrics negative OB ROS                            Anesthesia Physical  Anesthesia Plan  ASA: III  Anesthesia Plan: General   Post-op Pain  Management:    Induction: Intravenous  PONV Risk Score and Plan:   Airway Management Planned: Oral ETT  Additional Equipment:     Intra-op Plan:   Post-operative Plan: Extubation in OR  Informed Consent: I have reviewed the patients History and Physical, chart, labs and discussed the procedure including the risks, benefits and alternatives for the proposed anesthesia with the patient or authorized representative who has indicated his/her understanding and acceptance.     Dental Advisory Given  Plan Discussed with: Anesthesiologist, CRNA and Surgeon  Anesthesia Plan Comments:         Anesthesia Quick Evaluation  

## 2019-02-18 NOTE — Progress Notes (Signed)
Patient was able to urinate prior to going home.

## 2019-02-18 NOTE — Discharge Instructions (Addendum)
Inguinal Hernia, Adult °An inguinal hernia is when fat or your intestines push through a weak spot in a muscle where your leg meets your lower belly (groin). This causes a rounded lump (bulge). This kind of hernia could also be: °· In your scrotum, if you are male. °· In folds of skin around your vagina, if you are male. °There are three types of inguinal hernias. These include: °· Hernias that can be pushed back into the belly (are reducible). This type rarely causes pain. °· Hernias that cannot be pushed back into the belly (are incarcerated). °· Hernias that cannot be pushed back into the belly and lose their blood supply (are strangulated). This type needs emergency surgery. °If you do not have symptoms, you may not need treatment. If you have symptoms or a large hernia, you may need surgery. °Follow these instructions at home: °Lifestyle °· Do these things if told by your doctor so you do not have trouble pooping (constipation): °? Drink enough fluid to keep your pee (urine) pale yellow. °? Eat foods that have a lot of fiber. These include fresh fruits and vegetables, whole grains, and beans. °? Limit foods that are high in fat and processed sugars. These include foods that are fried or sweet. °? Take medicine for trouble pooping. °· Avoid lifting heavy objects. °· Avoid standing for long amounts of time. °· Do not use any products that contain nicotine or tobacco. These include cigarettes and e-cigarettes. If you need help quitting, ask your doctor. °· Stay at a healthy weight. °General instructions °· You may try to push your hernia in by very gently pressing on it when you are lying down. Do not try to force the bulge back in if it will not push in easily. °· Watch your hernia for any changes in shape, size, or color. Tell your doctor if you see any changes. °· Take over-the-counter and prescription medicines only as told by your doctor. °· Keep all follow-up visits as told by your doctor. This is  important. °Contact a doctor if: °· You have a fever. °· You have new symptoms. °· Your symptoms get worse. °Get help right away if: °· The area where your leg meets your lower belly has: °? Pain that gets worse suddenly. °? A bulge that gets bigger suddenly, and it does not get smaller after that. °? A bulge that turns red or purple. °? A bulge that is painful when you touch it. °· You are a man, and your scrotum: °? Suddenly feels painful. °? Suddenly changes in size. °· You cannot push the hernia in by very gently pressing on it when you are lying down. Do not try to force the bulge back in if it will not push in easily. °· You feel sick to your stomach (nauseous), and that feeling does not go away. °· You throw up (vomit), and that keeps happening. °· You have a fast heartbeat. °· You cannot poop (have a bowel movement) or pass gas. °These symptoms may be an emergency. Do not wait to see if the symptoms will go away. Get medical help right away. Call your local emergency services (911 in the [place] °Summary °· An inguinal hernia is when fat or your intestines push through a weak spot in a muscle where your leg meets your lower belly (groin). This causes a rounded lump (bulge). °· If you do not have symptoms, you may not need treatment. If you have symptoms or a large hernia, you   may need surgery.  Avoid lifting heavy objects. Also avoid standing for long amounts of time.  Do not try to force the bulge back in if it will not push in easily. This information is not intended to replace advice given to you by your health care provider. Make sure you discuss any questions you have with your health care provider. Document Released: 05/30/2006 Document Revised: 05/31/2017 Document Reviewed: 01/29/2017 Elsevier Patient Education  [place] [employer], Adult, Care After This sheet gives you information about how to care for yourself after your procedure. Your health care provider may also  give you more specific instructions. If you have problems or questions, contact your health care provider. What can I expect after the procedure? After the procedure, it is common to have:  Mild discomfort.  Slight bruising.  Minor swelling.  Pain in the abdomen. Follow these instructions at home: Incision care   Follow instructions from your health care provider about how to take care of your incision area. Make sure you: ? Wash your hands with soap and water before you change your bandage (dressing). If soap and water are not available, use hand sanitizer. ? Change your dressing as told by your health care provider. ? Leave stitches (sutures), skin glue, or adhesive strips in place. These skin closures may need to stay in place for 2 weeks or longer. If adhesive strip edges start to loosen and curl up, you may trim the loose edges. Do not remove adhesive strips completely unless your health care provider tells you to do that.  Check your incision area every day for signs of infection. Check for: ? More redness, swelling, or pain. ? More fluid or blood. ? Warmth. ? Pus or a bad smell. Activity  Do not drive or use heavy machinery while taking prescription pain medicine. Do not drive until your health care provider approves.  Until your health care provider approves: ? Do not lift anything that is heavier than 10 lb (4.5 kg). ? Do not play contact sports.  Return to your normal activities as told by your health care provider. Ask your health care provider what activities are safe. General instructions  To prevent or treat constipation while you are taking prescription pain medicine, your health care provider may recommend that you: ? Drink enough fluid to keep your urine clear or pale yellow. ? Take over-the-counter or prescription medicines. ? Eat foods that are high in fiber, such as fresh fruits and vegetables, whole grains, and beans. ? Limit foods that are high in fat and  processed sugars, such as fried and sweet foods.  Take over-the-counter and prescription medicines only as told by your health care provider.  Do not take tub baths or go swimming until your health care provider approves.  Keep all follow-up visits as told by your health care provider. This is important. Contact a health care provider if:  You develop a rash.  You have more redness, swelling, or pain around your incision.  You have more fluid or blood coming from your incision.  Your incision feels warm to the touch.  You have pus or a bad smell coming from your incision.  You have a fever or chills.  You have blood in your stool (feces).  You have not had a bowel movement in 2-3 days.  Your pain is not controlled with medicine. Get help right away if:  You have chest pain or shortness of breath.  You feel  light-headed or feel faint.  You have severe pain.  You vomit and your pain is worse. This information is not intended to replace advice given to you by your health care provider. Make sure you discuss any questions you have with your health care provider.   AMBULATORY SURGERY  DISCHARGE INSTRUCTIONS   1) The drugs that you were given will stay in your system until tomorrow so for the next 24 hours you should not:  A) Drive an automobile B) Make any legal decisions C) Drink any alcoholic beverage   2) You may resume regular meals tomorrow.  Today it is better to start with liquids and gradually work up to solid foods.  You may eat anything you prefer, but it is better to start with liquids, then soup and crackers, and gradually work up to solid foods.   3) Please notify your doctor immediately if you have any unusual bleeding, trouble breathing, redness and pain at the surgery site, drainage, fever, or pain not relieved by medication.    4) Additional Instructions:        Please contact your physician with any problems or Same Day Surgery at  [phone], Monday through Friday 6 am to 4 pm, or [place] at [facility] number at [phone]. Document Released: 11/16/2004 Document Revised: 04/11/2017 Document Reviewed: 10/11/2015 Elsevier Patient Education  2020 [employer].

## 2019-02-18 NOTE — Progress Notes (Signed)
imitrex given for headache

## 2019-02-18 NOTE — Anesthesia Postprocedure Evaluation (Signed)
Anesthesia Post Note  Patient: Patrick Montoya  Procedure(s) Performed: HERNIA REPAIR INGUINAL ADULT (Right ) INSERTION OF MESH (Right Inguinal)  Patient location during evaluation: PACU Anesthesia Type: General Level of consciousness: awake and alert and oriented Pain management: pain level controlled Vital Signs Assessment: post-procedure vital signs reviewed and stable Respiratory status: spontaneous breathing Cardiovascular status: blood pressure returned to baseline Anesthetic complications: no     Last Vitals:  Vitals:   02/18/19 1158 02/18/19 1258  BP: (!) 145/76 (!) 125/93  Pulse: 81   Resp: 18 18  Temp:    SpO2: 96% 95%    Last Pain:  Vitals:   02/18/19 1258  TempSrc:   PainSc: 0-No pain                 [name]

## 2019-02-18 NOTE — Anesthesia Post-op Follow-up Note (Signed)
Anesthesia QCDR form completed.        

## 2019-02-18 NOTE — Transfer of Care (Signed)
Immediate Anesthesia Transfer of Care Note  Patient: ASEEM MERISIER  Procedure(s) Performed: HERNIA REPAIR INGUINAL ADULT (Right )  Patient Location: PACU  Anesthesia Type:General  Level of Consciousness: awake and oriented  Airway & Oxygen Therapy: Patient Spontanous Breathing  Post-op Assessment: Report given to RN and Post -op Vital signs reviewed and stable  Post vital signs: Reviewed and stable  Last Vitals:  Vitals Value Taken Time  BP 123/74 02/18/19 1006  Temp    Pulse 79 02/18/19 1006  Resp 24 02/18/19 1006  SpO2 99 % 02/18/19 1006  Vitals shown include unvalidated device data.  Last Pain:  Vitals:   02/18/19 0730  TempSrc: Tympanic  PainSc: 0-No pain         Complications: no apparent complications

## 2019-02-19 ENCOUNTER — Encounter: Payer: Self-pay | Admitting: Urology

## 2019-02-22 ENCOUNTER — Ambulatory Visit: Payer: Medicare Other

## 2019-02-24 ENCOUNTER — Ambulatory Visit: Payer: Medicare Other

## 2019-03-01 ENCOUNTER — Ambulatory Visit: Payer: Medicare Other

## 2019-03-01 ENCOUNTER — Encounter: Payer: Self-pay | Admitting: Urology

## 2019-03-03 ENCOUNTER — Ambulatory Visit: Payer: Medicare Other

## 2019-03-08 ENCOUNTER — Ambulatory Visit: Payer: Medicare Other

## 2019-03-10 ENCOUNTER — Ambulatory Visit: Payer: Medicare Other

## 2019-03-15 ENCOUNTER — Ambulatory Visit: Payer: Medicare Other

## 2019-03-17 ENCOUNTER — Ambulatory Visit: Payer: Medicare Other

## 2019-03-22 ENCOUNTER — Ambulatory Visit: Payer: Medicare Other

## 2019-03-24 ENCOUNTER — Ambulatory Visit: Payer: Medicare Other

## 2019-03-26 DIAGNOSIS — G20 Parkinson's disease: Secondary | ICD-10-CM | POA: Insufficient documentation

## 2019-06-09 ENCOUNTER — Ambulatory Visit: Payer: Medicare Other

## 2019-06-17 ENCOUNTER — Ambulatory Visit: Payer: Medicare Other | Attending: Internal Medicine

## 2019-06-17 DIAGNOSIS — Z23 Encounter for immunization: Secondary | ICD-10-CM

## 2019-06-17 NOTE — Progress Notes (Signed)
   Covid-19 Vaccination Clinic  Name:  Patrick Montoya    MRN: [id number] DOB: Mar 24, 1941  06/17/2019  [name] was observed post Covid-19 immunization for 15 minutes without incidence. He was provided with Vaccine Information Sheet and instruction to access the V-Safe system.   [name] was instructed to call 911 with any severe reactions post vaccine: Marland Kitchen Difficulty breathing  . Swelling of your face and throat  . A fast heartbeat  . A bad rash all over your body  . Dizziness and weakness    Immunizations Administered    Name Date Dose VIS Date Route   Pfizer COVID-19 Vaccine 06/17/2019  8:33 AM 0.3 mL 04/23/2019 Intramuscular   Manufacturer: [place]   Lot: [id number]   [place]: [id number]

## 2019-07-12 ENCOUNTER — Ambulatory Visit: Payer: Medicare Other | Attending: Internal Medicine

## 2019-07-12 DIAGNOSIS — Z23 Encounter for immunization: Secondary | ICD-10-CM

## 2019-07-12 NOTE — Progress Notes (Signed)
   Covid-19 Vaccination Clinic  Name:  Patrick Montoya    MRN: [id number] DOB: 20-Feb-1941  07/12/2019  [name] was observed post Covid-19 immunization for 15 minutes without incidence. He was provided with Vaccine Information Sheet and instruction to access the V-Safe system.   [name] was instructed to call 911 with any severe reactions post vaccine: Marland Kitchen Difficulty breathing  . Swelling of your face and throat  . A fast heartbeat  . A bad rash all over your body  . Dizziness and weakness    Immunizations Administered    Name Date Dose VIS Date Route   Pfizer COVID-19 Vaccine 07/12/2019 12:04 PM 0.3 mL 04/23/2019 Intramuscular   Manufacturer: [place]   Lot: [id number]   [place]: [id number]

## 2019-07-29 ENCOUNTER — Other Ambulatory Visit: Payer: Medicare Other

## 2019-09-13 ENCOUNTER — Ambulatory Visit: Payer: Medicare Other | Attending: Neurology | Admitting: Occupational Therapy

## 2019-09-13 DIAGNOSIS — M6281 Muscle weakness (generalized): Secondary | ICD-10-CM | POA: Insufficient documentation

## 2019-09-13 DIAGNOSIS — R262 Difficulty in walking, not elsewhere classified: Secondary | ICD-10-CM | POA: Insufficient documentation

## 2019-09-13 DIAGNOSIS — R278 Other lack of coordination: Secondary | ICD-10-CM | POA: Insufficient documentation

## 2019-09-13 DIAGNOSIS — R2681 Unsteadiness on feet: Secondary | ICD-10-CM | POA: Insufficient documentation

## 2019-09-15 ENCOUNTER — Ambulatory Visit: Payer: Medicare Other | Admitting: Occupational Therapy

## 2019-09-20 ENCOUNTER — Ambulatory Visit: Payer: Medicare Other | Admitting: Occupational Therapy

## 2019-09-21 ENCOUNTER — Ambulatory Visit: Payer: Medicare Other | Admitting: Occupational Therapy

## 2019-09-22 ENCOUNTER — Ambulatory Visit: Payer: Medicare Other | Admitting: Occupational Therapy

## 2019-09-23 ENCOUNTER — Other Ambulatory Visit: Payer: Self-pay

## 2019-09-23 ENCOUNTER — Ambulatory Visit: Payer: Medicare Other | Admitting: Occupational Therapy

## 2019-09-23 DIAGNOSIS — R262 Difficulty in walking, not elsewhere classified: Secondary | ICD-10-CM

## 2019-09-23 DIAGNOSIS — M6281 Muscle weakness (generalized): Secondary | ICD-10-CM

## 2019-09-23 DIAGNOSIS — R2681 Unsteadiness on feet: Secondary | ICD-10-CM | POA: Diagnosis present

## 2019-09-23 DIAGNOSIS — R278 Other lack of coordination: Secondary | ICD-10-CM

## 2019-09-24 ENCOUNTER — Encounter: Payer: Self-pay | Admitting: Occupational Therapy

## 2019-09-24 NOTE — Therapy (Signed)
[place] MAIN [facility] SERVICES [address] [place], [place], [address] Phone: [phone]   Fax:  [phone]  Occupational Therapy Evaluation  Patient Details  Name: DECAMERON HOOD MRN: [id number] Date of Birth: 10-Oct-1940 Referring Provider (OT): Joselyn Arrow   Encounter Date: 09/23/2019  OT End of Session - 09/24/19 1157    Visit Number  1    Number of Visits  17    Date for OT Re-Evaluation  10/29/19    OT Start Time  1018    OT Stop Time  1106    OT Time Calculation (min)  48 min    Activity Tolerance  Patient tolerated treatment well       Past Medical History:  Diagnosis Date  . Basal cell carcinoma 07/25/2015   L zygoma inf to lat canthus  . Basal cell carcinoma 06/11/2018   L nasal ala  . BPH (benign prostatic hyperplasia)   . COPD (chronic obstructive pulmonary disease) ([place])   . Elevated liver enzymes   . Emphysema of lung ([place])   . Gastric ulcer   . GERD (gastroesophageal reflux disease)   . Hepatitis B   . Hypertension   . Parkinson's disease ([place])   . Prostate hypertrophy   . Rotator cuff tear right  . UTI (urinary tract infection)     Past Surgical History:  Procedure Laterality Date  . CHOLECYSTECTOMY    . COLONOSCOPY WITH PROPOFOL N/A 10/20/2015   Procedure: COLONOSCOPY WITH PROPOFOL;  Surgeon: Manya Silvas, MD;  Location[facility] ENDOSCOPY;  Service: Endoscopy;  Laterality: N/A;  . ESOPHAGOGASTRODUODENOSCOPY (EGD) WITH PROPOFOL N/A 10/20/2015   Procedure: ESOPHAGOGASTRODUODENOSCOPY (EGD) WITH PROPOFOL;  Surgeon: Manya Silvas, MD;  Location: [facility] ENDOSCOPY;  Service: Endoscopy;  Laterality: N/A;  . GREEN LIGHT LASER TURP (TRANSURETHRAL RESECTION OF PROSTATE N/A 11/17/2018   Procedure: GREEN LIGHT LASER TURP (TRANSURETHRAL RESECTION OF PROSTATE;  Surgeon: Royston Cowper, MD;  Location: [facility] ORS;  Service: Urology;  Laterality: N/A;  . INGUINAL HERNIA REPAIR Right 02/18/2019   Procedure: HERNIA REPAIR INGUINAL  ADULT;  Surgeon: Royston Cowper, MD;  Location: [facility] ORS;  Service: Urology;  Laterality: Right;  . INSERTION OF MESH Right 02/18/2019   Procedure: INSERTION OF MESH;  Surgeon: Royston Cowper, MD;  Location: [facility] ORS;  Service: Urology;  Laterality: Right;  . TONSILLECTOMY    . TRANSURETHRAL MICROWAVE THERAPY      There were no vitals filed for this visit.  Subjective Assessment - 09/24/19 1142    Subjective   Patient reports his wife has been in the hospital recently.  Patient reports he was referred due to his Parkinson's disease, headaches are worsening.  Has a headache 5 of 7 nights.  Comes on while he is asleep. Has been diagnosed with Parkinson's about 2 years ago.  Has not had any therapy in the past with Parkinson's.  Patient reports he couldn't take the time released carbidopa levodopa, got deathly sick and then changed the dosage.    Pertinent History  Patient reports being diagnosed with Parkinson's disease about 2 years ago with right hand tremors, dencreased balance and impaired mobility.    Patient Stated Goals  To continue to be as independent as possible, help to care for my wife so we can remain home together.    Currently in Pain?  Yes    Pain Score  2     Pain Location  Head    Pain Descriptors / Indicators  Headache    Pain Type  Acute pain    Pain Onset  Today    Pain Frequency  Constant        [facility] OT Assessment - 09/24/19 1145      Assessment   Medical Diagnosis  idiopathic Parkinson's disease    Referring Provider (OT)  Manuella Ghazi, H    Onset Date/Surgical Date  --   onset 2 years ago, recent changes to prompt referral    Hand Dominance  Right    Prior Therapy  none      Balance Screen   Has the patient fallen in the past 6 months  No    Has the patient had a decrease in activity level because of a fear of falling?   No    Is the patient reluctant to leave their home because of a fear of falling?   No      Home  Environment   Lives With  Spouse       Prior Function   Level of Independence  Independent    Vocation  Retired    Leisure  likes to garden, work outside      [employer]   Eating/Feeding  Independent    Grooming  Independent    Scientist, clinical (histocompatibility and immunogenetics)  Independent    Lower Body Bathing  Modified independent    Psychologist, forensic;Increased time    Lower Body Dressing  Modified independent;Increased time    Toilet Transfer  Modified independent    Toileting - Clothing Manipulation  Modified independent;Increased time    ADL comments  Patient reports he helps to care for his wife who has been in the hospital twice in recent weeks, she is on oxygen and he is the one who does most all of the household chores, cooking, cleaning and driving.        Mobility   Mobility Status  Independent    Mobility Status Comments  Patient ambulates without an assistive device, decreased step length and amplitude of steps, decreased reciprocal arm movements and decreased functional balance with mobility and transitional movements.        Written Expression   Dominant Hand  Right      Activity Tolerance   Activity Tolerance Comments  decreased activity tolerance compared to 1-2 years ago      Cognition   Overall Cognitive Status  Within Functional Limits for tasks assessed    Cognition Comments  Patient reports he forgot appt this date however was reminded when he received a reminder call.       Sensation   Light Touch  Appears Intact    Stereognosis  Appears Intact    Hot/Cold  Appears Intact      Coordination   Gross Motor Movements are Fluid and Coordinated  No    Fine Motor Movements are Fluid and Coordinated  No    9 Hole Peg Test  Right;Left    Right 9 Hole Peg Test  25    Left 9 Hole Peg Test  23      ROM / Strength   AROM / PROM / Strength  --   AROM WFLs for UB/LB     AROM   Right/Left Shoulder  --      Strength   Overall Strength  Deficits    Overall Strength Comments  Patient with overall strength in bilateral UE  4/5, LE 4/5      Hand Function   Right Hand  Grip (lbs)  75    Right Hand Lateral Pinch  25 lbs    Right Hand 3 Point Pinch  18 lbs    Left Hand Grip (lbs)  60    Left Hand Lateral Pinch  20 lbs    Left 3 point pinch  20 lbs      Patient reports increased time to complete tasks, difficulty remembering appts at times, decreased ability to perform gardening and outdoor tasks as he did in the past.    Merrilee Jansky Balance Test to be assessed next session 5 times sit to stand 16 secs 6 minute walk test 1140 FOTO score 60 Dull headache this am, 1 or 2, aching.   Right hand tremors at times, right handed.   Resting tremors, if he gets off schedule with medications he can see increased tremors.   Injury to left hand tip of MF 20-30 years ago and small finger tends to stay in ABD.   Opposition full in bilateral UEs,  o2 sats at the beginning of 6 min walk 94  Pulse 66 145/82 blood pressure 6 min walk 1140 feet 135/82 blood pressure after walk 92 O2 sats after walk Pulse 68 Decreased reciprocal arm movements Shortened step on right decreased amplitude of gait                OT Education - 09/24/19 1156    Education Details  LSVT BIG, deficits, goals, POC    Comprehension  Verbalized understanding          OT Long Term Goals - 09/24/19 1203      OT LONG TERM GOAL #1   Title  Patient will improve gait speed and endurance to be able to ambulate 1300 feet in 6 minutes to negotiate around the home and community safely in 4 weeks.    Baseline  1140 at eval    Time  4    Period  Weeks    Status  New    Target Date  10/29/19      OT LONG TERM GOAL #2   Title  Patient will complete HEP for maximal daily exercises with modified independence in 4 weeks.    Baseline  no current program    Time  4    Period  Weeks    Status  New    Target Date  10/29/19      OT LONG TERM GOAL #3   Title  Patient will transfer from to sit to stand without the use of arms safely and  independently from a variety of chairs/surfaces in 4 weeks.    Baseline  difficulty with lower surfaces at eval    Time  4    Period  Weeks    Status  New    Target Date  10/29/19      OT LONG TERM GOAL #4   Title  Balance to be further tested next session with goal to follow.    Time  1    Period  Weeks    Status  New    Target Date  10/29/19            Plan - 09/24/19 1158    Clinical Impression Statement  Patient is a 79 yo male diagnosed with Parkinsonian symptoms and referred by his physician for LSVT BIG program.  Patient presents with decreased step length with gait patterns, decreased reciprocal arm swing, decreased balance, decreased transfers and transitional movements, mildly decreased coordination and muscle strength  which affect his ability to perform daily tasks.  The patient is judged to be an excellent candidate for the LSVT BIG program.  He would benefit from and was referred for the LSVT BIG program which is an intensive program designed specifically for Parkinson's patients with a focus on increasing amplitude and speed of movements, improving self-care and daily tasks and providing patients with daily exercises to improve overall function.  It is recommended that the patient receive the LSVT BIG program, which is comprised of 16 intensive sessions, (4 times a week for 4 weeks, one-hour sessions).  Prognosis for improvement is good based on patient's motivation and family support.  LSVT BIG has been documented in the literature as efficacious for individuals with Parkinson's disease and those with Parkinsonian symptoms.    OT Occupational Profile and History  Detailed Assessment- Review of Records and additional review of physical, cognitive, psychosocial history related to current functional performance    Occupational performance deficits (Please refer to evaluation for details):  IADL's;ADL's;Leisure;Rest and Sleep    Body Structure / Function / Physical Skills   ADL;Dexterity;Flexibility;Strength;Coordination;Balance;FMC;IADL;Endurance;Gait;Pain;UE functional use;Mobility    Psychosocial Skills  Coping Strategies;Environmental  Adaptations;Habits;Routines and Behaviors    Rehab Potential  Excellent    Clinical Decision Making  Limited treatment options, no task modification necessary    Comorbidities Affecting Occupational Performance:  Presence of comorbidities impacting occupational performance    Comorbidities impacting occupational performance description:  migraine headaches, COPD, 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    Modification or Assistance to Complete Evaluation   No modification of tasks or assist necessary to complete eval    OT Frequency  4x / week    OT Duration  4 weeks    OT Treatment/Interventions  Self-care/ADL training;Therapeutic exercise;DME and/or AE instruction;Functional Mobility Training;Manual Therapy;Neuromuscular education;Gait Optician, dispensing;Therapeutic activities;Patient/family education;Coping strategies training;Balance training    Consulted and Agree with Plan of Care  Patient       Patient will benefit from skilled therapeutic intervention in order to improve the following deficits and impairments:   Body Structure / Function / Physical Skills: ADL, Dexterity, Flexibility, Strength, Coordination, Balance, FMC, IADL, Endurance, Gait, Pain, UE functional use, Mobility   Psychosocial Skills: Coping Strategies, Environmental  Adaptations, Habits, Routines and Behaviors   Visit Diagnosis: Muscle weakness (generalized)  Other lack of coordination  Difficulty in walking, not elsewhere classified  Unsteadiness on feet    Problem List Patient Active Problem List   Diagnosis Date Noted  . Sternal fracture with retrosternal contusion, closed, initial encounter 10/31/2017  . Chronic bronchitis ([place]) 05/28/2015  . Gastro-esophageal reflux disease without esophagitis 05/26/2015  . Insomnia,  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  . Chronic obstructive pulmonary disease ([place]) 04/11/2014  . Benign prostatic hypertrophy without urinary obstruction 01/21/2014  . Acute bronchitis with chronic obstructive pulmonary disease (COPD) ([place]) 01/21/2014  . Essential (primary) hypertension 01/21/2014  . SINUSITIS- ACUTE-NOS 07/03/2007  . RENAL CALCULUS 06/05/2007  . HYPERTENSION 02/27/2007  . COPD 02/27/2007  . GERD 02/27/2007  . PEPTIC ULCER DISEASE 02/27/2007  . BENIGN PROSTATIC HYPERTROPHY 02/27/2007  . BURSITIS, RIGHT SHOULDER 02/27/2007  . COLONIC POLYPS, HX OF 02/27/2007  . FROZEN RIGHT SHOULDER 12/05/2006   Nylia Gavina T Tomasita Morrow, OTR/L, CLT  Mazzie Brodrick 09/24/2019, 12:06 PM  [place] MAIN [facility] SERVICES [address] [place], [place], [address] Phone: [phone]   Fax:  [phone]  Name: BOUBACAR SHINER MRN: [id number] Date of Birth: [birth year]

## 2019-09-27 ENCOUNTER — Ambulatory Visit: Payer: Medicare Other | Admitting: Occupational Therapy

## 2019-09-28 ENCOUNTER — Ambulatory Visit: Payer: Medicare Other | Admitting: Occupational Therapy

## 2019-09-29 ENCOUNTER — Ambulatory Visit: Payer: Medicare Other | Admitting: Occupational Therapy

## 2019-09-30 ENCOUNTER — Ambulatory Visit: Payer: Medicare Other | Admitting: Occupational Therapy

## 2019-10-04 ENCOUNTER — Ambulatory Visit: Payer: Medicare Other | Admitting: Occupational Therapy

## 2019-10-04 ENCOUNTER — Other Ambulatory Visit: Payer: Self-pay

## 2019-10-04 ENCOUNTER — Ambulatory Visit (INDEPENDENT_AMBULATORY_CARE_PROVIDER_SITE_OTHER): Payer: Medicare Other | Admitting: Dermatology

## 2019-10-04 DIAGNOSIS — Z1283 Encounter for screening for malignant neoplasm of skin: Secondary | ICD-10-CM

## 2019-10-04 DIAGNOSIS — L578 Other skin changes due to chronic exposure to nonionizing radiation: Secondary | ICD-10-CM

## 2019-10-04 DIAGNOSIS — L820 Inflamed seborrheic keratosis: Secondary | ICD-10-CM | POA: Diagnosis not present

## 2019-10-04 DIAGNOSIS — Z85828 Personal history of other malignant neoplasm of skin: Secondary | ICD-10-CM | POA: Diagnosis not present

## 2019-10-04 DIAGNOSIS — D485 Neoplasm of uncertain behavior of skin: Secondary | ICD-10-CM

## 2019-10-04 DIAGNOSIS — D492 Neoplasm of unspecified behavior of bone, soft tissue, and skin: Secondary | ICD-10-CM

## 2019-10-04 DIAGNOSIS — L570 Actinic keratosis: Secondary | ICD-10-CM

## 2019-10-04 DIAGNOSIS — D229 Melanocytic nevi, unspecified: Secondary | ICD-10-CM | POA: Diagnosis not present

## 2019-10-04 DIAGNOSIS — L821 Other seborrheic keratosis: Secondary | ICD-10-CM

## 2019-10-04 DIAGNOSIS — D489 Neoplasm of uncertain behavior, unspecified: Secondary | ICD-10-CM

## 2019-10-04 MED ORDER — EUCRISA 2 % EX OINT: TOPICAL_OINTMENT | CUTANEOUS | 2 refills | Status: DC

## 2019-10-04 NOTE — Patient Instructions (Signed)
Cryotherapy Aftercare  . Wash gently with soap and water everyday.   . Apply Vaseline and Band-Aid daily until healed.  

## 2019-10-04 NOTE — Progress Notes (Deleted)
   Follow-Up Visit   Subjective  Patrick Montoya is a 79 y.o. male who presents for the following: Annual Exam (6 months f/u moles UBSE), Actinic Keratosis (6 months f/u on scalp hx of Aks, pt spot treating imiquimod cream ), and Rash (itchy skin in the groin area for several years, pt using otc antibotic cream with a fair response ). Pt taking Doxycycline 20 mg bid for folliculitis on his scalp with a fair response.  Wife with pt  The following portions of the chart were reviewed this encounter and updated as appropriate:      Review of Systems:  No other skin or systemic complaints except as noted in HPI or Assessment and Plan.  Objective  Well appearing patient in no apparent distress; mood and affect are within normal limits.  All skin waist up examined.  Objective  Neck - Posterior, L lateral canthus, scalp (5): Erythematous thin papules/macules with gritty scale.   Objective  Scalp: folliculitis controlled   Objective  groin: Pruritus of the groin    Assessment & Plan    Lentigines - Scattered tan macules - Discussed due to sun exposure - Benign, observe - Call for any changes  Seborrheic Keratoses - Stuck-on, waxy, tan-brown papules and plaques  - Discussed benign etiology and prognosis. - Observe - Call for any changes  Melanocytic Nevi - Tan-brown and/or pink-flesh-colored symmetric macules and papules - Benign appearing on exam today - Observation - Call clinic for new or changing moles - Recommend daily use of broad spectrum spf 30+ sunscreen to sun-exposed areas.   Hemangiomas - Red papules - Discussed benign nature - Observe - Call for any changes  Actinic Damage - diffuse scaly erythematous macules with underlying dyspigmentation - Recommend daily broad spectrum sunscreen SPF 30+ to sun-exposed areas, reapply every 2 hours as needed.  - Call for new or changing lesions.  Skin cancer screening performed today. AK (actinic keratosis)  (5) Neck - Posterior, L lateral canthus, scalp  Cont spot treating with Imiquimod cream   Destruction of lesion - Neck - Posterior, L lateral canthus, scalp Complexity: simple   Destruction method: cryotherapy   Informed consent: discussed and consent obtained   Timeout:  patient name, date of birth, surgical site, and procedure verified Lesion destroyed using liquid nitrogen: Yes   Region frozen until ice ball extended beyond lesion: Yes   Outcome: patient tolerated procedure well with no complications   Post-procedure details: wound care instructions given    Folliculitis Scalp  Cont Doxycyline 20 mg bid prn   Pruritus groin  Start Eucrisa ointment apply to skin bid   Crisaborole (EUCRISA) 2 % OINT - groin  Return in about 6 months (around 04/05/2020).  IMarye Round, CMA, am acting as scribe for Sarina Ser, MD .

## 2019-10-04 NOTE — Progress Notes (Signed)
Follow-Up Visit   Subjective  Patrick Montoya is a 79 y.o. male who presents for the following: Annual Exam (yearly UBSE hx of BCC ). Pt report several new spots on his face growing.  An upper body skin exam was performed for skin cancer screening and mole check.  The following portions of the chart were reviewed this encounter and updated as appropriate:  Tobacco  Allergies  Meds  Problems  Med Hx  Surg Hx  Fam Hx     Review of Systems:  No other skin or systemic complaints except as noted in HPI or Assessment and Plan.  Objective  Well appearing patient in no apparent distress; mood and affect are within normal limits.  All skin waist up examined.  Objective  L forehead (3): Erythematous thin papules/macules with gritty scale.   Objective  R side burn: Erythematous keratotic or waxy stuck-on papule or plaque.   Objective  L nose: Irregular pink patch        Objective  R clavicle: 2 pink patches    Assessment & Plan  AK (actinic keratosis) (3) L forehead  Destruction of lesion - L forehead Complexity: simple   Destruction method: cryotherapy   Informed consent: discussed and consent obtained   Timeout:  patient name, date of birth, surgical site, and procedure verified Lesion destroyed using liquid nitrogen: Yes   Region frozen until ice ball extended beyond lesion: Yes   Outcome: patient tolerated procedure well with no complications   Post-procedure details: wound care instructions given    Inflamed seborrheic keratosis R side burn  Destruction of lesion - R side burn Complexity: simple   Destruction method: cryotherapy   Informed consent: discussed and consent obtained   Timeout:  patient name, date of birth, surgical site, and procedure verified Lesion destroyed using liquid nitrogen: Yes   Region frozen until ice ball extended beyond lesion: Yes   Outcome: patient tolerated procedure well with no complications   Post-procedure details:  wound care instructions given    Neoplasm of skin L nose  Skin / nail biopsy Type of biopsy: tangential   Informed consent: discussed and consent obtained   Patient was prepped and draped in usual sterile fashion: area prepped with alochol. Anesthesia: the lesion was anesthetized in a standard fashion   Anesthetic:  1% lidocaine w/ epinephrine 1-100,000 buffered w/ 8.4% NaHCO3 Instrument used: flexible razor blade   Hemostasis achieved with: pressure, aluminum chloride and electrodesiccation   Outcome: patient tolerated procedure well   Post-procedure details: wound care instructions given   Post-procedure details comment:  Ointment and small bandage  Specimen 1 - Surgical pathology Differential Diagnosis: R/O BCC Check Margins: No Irregular pink patch  If pathology confirm skin cancer we will refer for Mohs   Neoplasm of uncertain behavior R clavicle  Observe, recheck in 3 months may consider biopsy   Actinic Damage - diffuse scaly erythematous macules with underlying dyspigmentation - Recommend daily broad spectrum sunscreen SPF 30+ to sun-exposed areas, reapply every 2 hours as needed.  - Call for new or changing lesions.  History of Basal Cell Carcinoma of the Skin L nose - No evidence of recurrence today - Recommend regular full body skin exams - Recommend daily broad spectrum sunscreen SPF 30+ to sun-exposed areas, reapply every 2 hours as needed.  - Call if any new or changing lesions are noted between office visits  Seborrheic Keratoses - Stuck-on, waxy, tan-brown papules and plaques  - Discussed benign etiology and  prognosis. - Observe - Call for any changes  Melanocytic Nevi - Tan-brown and/or pink-flesh-colored symmetric macules and papules - Benign appearing on exam today - Observation - Call clinic for new or changing moles - Recommend daily use of broad spectrum spf 30+ sunscreen to sun-exposed areas.   Return in about 3 months (around  01/04/2020).  IMarye Round, CMA, am acting as scribe for Sarina Ser, MD .  Documentation: I have reviewed the above documentation for accuracy and completeness, and I agree with the above.  Sarina Ser, MD

## 2019-10-05 ENCOUNTER — Ambulatory Visit: Payer: Medicare Other | Admitting: Occupational Therapy

## 2019-10-05 ENCOUNTER — Encounter: Payer: Self-pay | Admitting: Dermatology

## 2019-10-06 ENCOUNTER — Ambulatory Visit: Payer: Medicare Other | Admitting: Occupational Therapy

## 2019-10-07 ENCOUNTER — Other Ambulatory Visit: Payer: Self-pay

## 2019-10-07 ENCOUNTER — Ambulatory Visit: Payer: Medicare Other | Admitting: Occupational Therapy

## 2019-10-07 ENCOUNTER — Telehealth: Payer: Self-pay

## 2019-10-07 DIAGNOSIS — C44311 Basal cell carcinoma of skin of nose: Secondary | ICD-10-CM

## 2019-10-07 NOTE — Telephone Encounter (Signed)
Discussed biopsy results with pt, pt would like to go to [facility] for Mohs

## 2019-10-07 NOTE — Telephone Encounter (Signed)
-----   Message from Ralene Bathe, MD sent at 10/06/2019  6:05 PM EDT ----- Skin , left nose BASAL CELL CARCINOMA, INFILTRATIVE PATTERN  Cancer - BCC Infiltrative Schedule for MOHS

## 2019-10-07 NOTE — Telephone Encounter (Signed)
LM on VM please return my call  

## 2019-10-12 ENCOUNTER — Ambulatory Visit: Payer: Medicare Other | Admitting: Occupational Therapy

## 2019-10-13 ENCOUNTER — Ambulatory Visit: Payer: Medicare Other | Admitting: Occupational Therapy

## 2019-10-14 ENCOUNTER — Ambulatory Visit: Payer: Medicare Other | Admitting: Occupational Therapy

## 2019-10-14 ENCOUNTER — Encounter: Payer: Medicare Other | Admitting: Occupational Therapy

## 2019-10-15 ENCOUNTER — Encounter: Payer: Medicare Other | Admitting: Occupational Therapy

## 2019-10-18 ENCOUNTER — Encounter: Payer: Medicare Other | Admitting: Occupational Therapy

## 2019-10-19 ENCOUNTER — Encounter: Payer: Medicare Other | Admitting: Occupational Therapy

## 2019-10-20 ENCOUNTER — Encounter: Payer: Medicare Other | Admitting: Occupational Therapy

## 2019-10-21 ENCOUNTER — Encounter: Payer: Medicare Other | Admitting: Occupational Therapy

## 2019-10-22 ENCOUNTER — Encounter: Payer: Medicare Other | Admitting: Occupational Therapy

## 2019-10-25 ENCOUNTER — Encounter: Payer: Medicare Other | Admitting: Occupational Therapy

## 2019-12-31 ENCOUNTER — Other Ambulatory Visit: Payer: Self-pay | Admitting: Specialist

## 2019-12-31 DIAGNOSIS — R911 Solitary pulmonary nodule: Secondary | ICD-10-CM

## 2020-01-03 ENCOUNTER — Ambulatory Visit: Payer: Medicare Other

## 2020-01-10 ENCOUNTER — Ambulatory Visit: Payer: Medicare Other | Admitting: Dermatology

## 2020-01-12 ENCOUNTER — Ambulatory Visit: Payer: Medicare Other

## 2020-01-14 ENCOUNTER — Ambulatory Visit
Admission: RE | Admit: 2020-01-14 | Discharge: 2020-01-14 | Disposition: A | Discharge status: Home / Self Care | Payer: Medicare Other | Source: Ambulatory Visit | Attending: Specialist | Admitting: Specialist

## 2020-01-14 ENCOUNTER — Other Ambulatory Visit: Payer: Self-pay

## 2020-01-14 DIAGNOSIS — R911 Solitary pulmonary nodule: Secondary | ICD-10-CM | POA: Insufficient documentation

## 2020-01-18 ENCOUNTER — Ambulatory Visit: Payer: Medicare Other | Admitting: Occupational Therapy

## 2020-01-19 ENCOUNTER — Encounter: Payer: Medicare Other | Admitting: Occupational Therapy

## 2020-01-20 ENCOUNTER — Encounter: Payer: Medicare Other | Admitting: Occupational Therapy

## 2020-01-21 ENCOUNTER — Encounter: Payer: Medicare Other | Admitting: Occupational Therapy

## 2020-01-24 ENCOUNTER — Encounter: Payer: Medicare Other | Admitting: Occupational Therapy

## 2020-01-25 ENCOUNTER — Encounter: Payer: Medicare Other | Admitting: Occupational Therapy

## 2020-01-26 ENCOUNTER — Encounter: Payer: Medicare Other | Admitting: Occupational Therapy

## 2020-01-27 ENCOUNTER — Encounter: Payer: Medicare Other | Admitting: Occupational Therapy

## 2020-01-31 ENCOUNTER — Encounter: Payer: Medicare Other | Admitting: Occupational Therapy

## 2020-02-01 ENCOUNTER — Encounter: Payer: Medicare Other | Admitting: Occupational Therapy

## 2020-02-02 ENCOUNTER — Encounter: Payer: Medicare Other | Admitting: Occupational Therapy

## 2020-02-03 ENCOUNTER — Encounter: Payer: Medicare Other | Admitting: Occupational Therapy

## 2020-02-07 ENCOUNTER — Encounter: Payer: Medicare Other | Admitting: Occupational Therapy

## 2020-02-09 ENCOUNTER — Encounter: Payer: Medicare Other | Admitting: Occupational Therapy

## 2020-02-10 ENCOUNTER — Encounter: Payer: Medicare Other | Admitting: Occupational Therapy

## 2020-02-11 ENCOUNTER — Encounter: Payer: Medicare Other | Admitting: Occupational Therapy

## 2020-08-30 ENCOUNTER — Other Ambulatory Visit: Payer: Self-pay

## 2020-08-30 ENCOUNTER — Emergency Department
Admission: EM | Admit: 2020-08-30 | Discharge: 2020-08-30 | Disposition: A | Discharge status: Home / Self Care | Payer: Medicare Other | Attending: Emergency Medicine | Admitting: Emergency Medicine

## 2020-08-30 ENCOUNTER — Emergency Department: Payer: Medicare Other

## 2020-08-30 DIAGNOSIS — J449 Chronic obstructive pulmonary disease, unspecified: Secondary | ICD-10-CM | POA: Insufficient documentation

## 2020-08-30 DIAGNOSIS — E860 Dehydration: Secondary | ICD-10-CM | POA: Diagnosis not present

## 2020-08-30 DIAGNOSIS — Z79899 Other long term (current) drug therapy: Secondary | ICD-10-CM | POA: Diagnosis not present

## 2020-08-30 DIAGNOSIS — G20 Parkinson's disease: Secondary | ICD-10-CM | POA: Insufficient documentation

## 2020-08-30 DIAGNOSIS — I10 Essential (primary) hypertension: Secondary | ICD-10-CM | POA: Diagnosis not present

## 2020-08-30 DIAGNOSIS — F17210 Nicotine dependence, cigarettes, uncomplicated: Secondary | ICD-10-CM | POA: Insufficient documentation

## 2020-08-30 DIAGNOSIS — B9689 Other specified bacterial agents as the cause of diseases classified elsewhere: Secondary | ICD-10-CM | POA: Diagnosis not present

## 2020-08-30 DIAGNOSIS — Z85828 Personal history of other malignant neoplasm of skin: Secondary | ICD-10-CM | POA: Insufficient documentation

## 2020-08-30 DIAGNOSIS — R42 Dizziness and giddiness: Secondary | ICD-10-CM | POA: Diagnosis present

## 2020-08-30 DIAGNOSIS — Z7982 Long term (current) use of aspirin: Secondary | ICD-10-CM | POA: Insufficient documentation

## 2020-08-30 DIAGNOSIS — N390 Urinary tract infection, site not specified: Secondary | ICD-10-CM | POA: Insufficient documentation

## 2020-08-30 LAB — CBC
HCT: 46.3 % (ref 39.0–52.0)
Hemoglobin: 15.5 g/dL (ref 13.0–17.0)
MCH: 33 pg (ref 26.0–34.0)
MCHC: 33.5 g/dL (ref 30.0–36.0)
MCV: 98.5 fL (ref 80.0–100.0)
Platelets: 169 10*3/uL (ref 150–400)
RBC: 4.7 MIL/uL (ref 4.22–5.81)
RDW: 13.3 % (ref 11.5–15.5)
WBC: 8.4 10*3/uL (ref 4.0–10.5)
nRBC: 0 % (ref 0.0–0.2)

## 2020-08-30 LAB — URINALYSIS, COMPLETE (UACMP) WITH MICROSCOPIC
Bilirubin Urine: NEGATIVE
Glucose, UA: NEGATIVE mg/dL
Hgb urine dipstick: NEGATIVE
Ketones, ur: 5 mg/dL — AB
Nitrite: NEGATIVE
Protein, ur: NEGATIVE mg/dL
Specific Gravity, Urine: 1.027 (ref 1.005–1.030)
WBC, UA: >50 WBC/hpf — ABNORMAL HIGH (ref 0–5)
pH: 5 (ref 5.0–8.0)

## 2020-08-30 LAB — BASIC METABOLIC PANEL
Anion gap: 12 (ref 5–15)
BUN: 32 mg/dL — ABNORMAL HIGH (ref 8–23)
CO2: 26 mmol/L (ref 22–32)
Calcium: 9.3 mg/dL (ref 8.9–10.3)
Chloride: 103 mmol/L (ref 98–111)
Creatinine, Ser: 1.28 mg/dL — ABNORMAL HIGH (ref 0.61–1.24)
GFR, Estimated: 57 mL/min — ABNORMAL LOW (ref >60)
Glucose, Bld: 115 mg/dL — ABNORMAL HIGH (ref 70–99)
Potassium: 3.7 mmol/L (ref 3.5–5.1)
Sodium: 141 mmol/L (ref 135–145)

## 2020-08-30 LAB — TROPONIN I (HIGH SENSITIVITY)
Troponin I (High Sensitivity): 8 ng/L (ref <18)
Troponin I (High Sensitivity): 7 ng/L (ref <18)

## 2020-08-30 MED ORDER — SODIUM CHLORIDE 0.9 % IV SOLN
1.0000 g | Freq: Once | INTRAVENOUS | Status: AC
  Administered 2020-08-30: 1 g via INTRAVENOUS
  Filled 2020-08-30: qty 10

## 2020-08-30 MED ORDER — CEPHALEXIN 500 MG PO CAPS: 500.0000 mg | ORAL_CAPSULE | Freq: Four times a day (QID) | ORAL | 0 refills | Status: AC

## 2020-08-30 MED ORDER — SODIUM CHLORIDE 0.9 % IV BOLUS
1000.0000 mL | Freq: Once | INTRAVENOUS | Status: AC
  Administered 2020-08-30: 1000 mL via INTRAVENOUS

## 2020-08-30 NOTE — Discharge Instructions (Signed)
Please seek medical attention for any high fevers, chest pain, shortness of breath, change in behavior, persistent vomiting, bloody stool or any other new or concerning symptoms.  

## 2020-08-30 NOTE — ED Triage Notes (Signed)
Pt comes with c/o dizziness that started yesterday. Pt states when he goes to stand he gets lightheaded and dizzy. Pt states some SOB. Pt has hx of COPD.  Pt states hx of Parkinson's as well and unsure if that is connected to this.  Pt unsteady on feet when assisted to bed.

## 2020-08-30 NOTE — ED Notes (Signed)
Called lab for repeat trop draw.

## 2020-08-30 NOTE — ED Provider Notes (Signed)
[facility] Emergency Department Provider Note   ____________________________________________   I have reviewed the triage vital signs and the nursing notes.   HISTORY  Chief Complaint Dizziness   History limited by: Not Limited   HPI Patrick Montoya is a 80 y.o. male who presents to the emergency department today because of concern for dizziness. The patient states that symptoms started yesterday. He felt lightheaded when he would stand up or try to walk. He felt like he would pass out if he continued to walk. It did get better when he would sit down again. He says he did have some associated pressure in his chest, as well as SOB and cough. He did feel better this morning but still felt weak. He denies any fevers. Does feel like he is dehydrated with a dry mouth. Denies any change in urination.   Records reviewed. Per medical record review patient has a history of COPD, parkinson's disease, HTN.  Past Medical History:  Diagnosis Date  . Basal cell carcinoma 07/25/2015   L zygoma inf to lat canthus  . Basal cell carcinoma 06/11/2018   L nasal ala  . BPH (benign prostatic hyperplasia)   . COPD (chronic obstructive pulmonary disease) ([place])   . Elevated liver enzymes   . Emphysema of lung ([place])   . Gastric ulcer   . GERD (gastroesophageal reflux disease)   . Hepatitis B   . Hypertension   . Parkinson's disease ([place])   . Prostate hypertrophy   . Rotator cuff tear right  . UTI (urinary tract infection)     Patient Active Problem List   Diagnosis Date Noted  . Sternal fracture with retrosternal contusion, closed, initial encounter 10/31/2017  . Chronic bronchitis ([place]) 05/28/2015  . Gastro-esophageal reflux disease without esophagitis 05/26/2015  . 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 Chronic obstructive pulmonary disease ([place]) 04/11/2014  . Benign prostatic hypertrophy without urinary obstruction 01/21/2014  . Acute bronchitis with chronic obstructive  pulmonary disease (COPD) ([place]) 01/21/2014  . Essential (primary) hypertension 01/21/2014  . SINUSITIS- ACUTE-NOS 07/03/2007  . RENAL CALCULUS 06/05/2007  . HYPERTENSION 02/27/2007  . COPD 02/27/2007  . GERD 02/27/2007  . PEPTIC ULCER DISEASE 02/27/2007  . BENIGN PROSTATIC HYPERTROPHY 02/27/2007  . BURSITIS, RIGHT SHOULDER 02/27/2007  . COLONIC POLYPS, HX OF 02/27/2007  . FROZEN RIGHT SHOULDER 12/05/2006    Past Surgical History:  Procedure Laterality Date  . CHOLECYSTECTOMY    . COLONOSCOPY WITH PROPOFOL N/A 10/20/2015   Procedure: COLONOSCOPY WITH PROPOFOL;  Surgeon: Manya Silvas, MD;  Location: [facility] ENDOSCOPY;  Service: Endoscopy;  Laterality: N/A;  . ESOPHAGOGASTRODUODENOSCOPY (EGD) WITH PROPOFOL N/A 10/20/2015   Procedure: ESOPHAGOGASTRODUODENOSCOPY (EGD) WITH PROPOFOL;  Surgeon: Manya Silvas, MD;  Location: [facility] ENDOSCOPY;  Service: Endoscopy;  Laterality: N/A;  . GREEN LIGHT LASER TURP (TRANSURETHRAL RESECTION OF PROSTATE N/A 11/17/2018   Procedure: GREEN LIGHT LASER TURP (TRANSURETHRAL RESECTION OF PROSTATE;  Surgeon: Royston Cowper, MD;  Location: [facility] ORS;  Service: Urology;  Laterality: N/A;  . INGUINAL HERNIA REPAIR Right 02/18/2019   Procedure: HERNIA REPAIR INGUINAL ADULT;  Surgeon: Royston Cowper, MD;  Location: [facility] ORS;  Service: Urology;  Laterality: Right;  . INSERTION OF MESH Right 02/18/2019   Procedure: INSERTION OF MESH;  Surgeon: Royston Cowper, MD;  Location: [facility] ORS;  Service: Urology;  Laterality: Right;  . TONSILLECTOMY    . TRANSURETHRAL MICROWAVE THERAPY      Prior to Admission medications   Medication Sig Start Date  End Date Taking? Authorizing Provider  albuterol (PROVENTIL HFA;VENTOLIN HFA) 108 (90 Base) MCG/ACT inhaler Inhale 2 puffs into the lungs every 4 (four) hours as needed for wheezing or shortness of breath.    [provider]  albuterol (PROVENTIL) (2.5 MG/3ML) 0.083% nebulizer solution Take 5 mg by nebulization at bedtime.      [provider]  ALPRAZolam Duanne Moron) 0.5 MG tablet Take 0.5 mg by mouth at bedtime.     [provider]  amLODipine (NORVASC) 2.5 MG tablet Take 2.5 mg by mouth daily. 08/02/18   [provider]  aspirin 81 MG tablet Take 81 mg by mouth daily.      [provider]  carbidopa-levodopa (SINEMET CR) 50-200 MG tablet Take 1 tablet by mouth 3 (three) times daily.     [provider]  cephALEXin (KEFLEX) 500 MG capsule Take 1 capsule (500 mg total) by mouth 4 (four) times daily. 02/18/19   Royston Cowper, MD  ciprofloxacin (CIPRO) 500 MG tablet Take 1 tablet (500 mg total) by mouth 2 (two) times daily. Patient not taking: Reported on 02/05/2019 11/17/18   Royston Cowper, MD  Cyanocobalamin ([employer] VITAMIN B-12) 2000 MCG TBCR Take 2,000 mcg by mouth daily.    [provider]  docusate sodium (COLACE) 100 MG capsule Take 100 mg by mouth daily.    [provider]  doxazosin (CARDURA) 4 MG tablet Take 4 mg by mouth daily.     [provider]  KLOR-CON M10 10 MEQ tablet Take 30 mEq by mouth daily.  10/31/15   [provider]  Meth-Hyo-M Bl-Na Phos-Ph Sal (URIBEL) 118 MG CAPS Take 1 capsule (118 mg total) by mouth every 6 (six) hours as needed. Patient not taking: Reported on 02/05/2019 11/17/18   Royston Cowper, MD  NUCYNTA 50 MG tablet Take 1 tablet (50 mg total) by mouth every 6 (six) hours as needed. 1 TO 2 TABS Q 6 HOURS PRN PAIN 02/18/19   Royston Cowper, MD  omeprazole (PRILOSEC) 40 MG capsule Take 40 mg by mouth daily.  11/06/15   [provider]  SUMAtriptan (IMITREX) 50 MG tablet Take 50 mg by mouth every 2 (two) hours as needed for migraine (max 2 tablets/24hrs). May repeat in 2 hours if headache persists or recurs.    [provider]  tiotropium (SPIRIVA HANDIHALER) 18 MCG inhalation capsule Take 1 capsule by mouth daily.  05/31/16   [provider]    Allergies Patient has no known  allergies.  Family History  Problem Relation Age of Onset  . Colon cancer Mother   . Hepatitis Son   . Prostate cancer Neg Hx   . Kidney cancer Neg Hx     Social History Social History   Tobacco Use  . Smoking status: Light Tobacco Smoker    Packs/day: 1.00    Years: 62.00    Pack years: 62.00    Types: Cigarettes  . Smokeless tobacco: Never Used  Vaping Use  . Vaping Use: Never used  Substance Use Topics  . Alcohol use: Yes    Comment: occasional  . Drug use: No    Review of Systems Constitutional: No fever/chills Eyes: No visual changes. ENT: Positive for dry mouth.  Cardiovascular: Positive for chest pain. Respiratory: Positive for cough and shortness of breath. Gastrointestinal: No abdominal pain.  No nausea, no vomiting.  No diarrhea.   Genitourinary: Negative for dysuria. Musculoskeletal: Negative for back pain. Skin: Negative for rash.  Neurological: Positive for dizziness and lightheadedness.  ____________________________________________   PHYSICAL EXAM:  VITAL SIGNS: ED Triage Vitals [08/30/20 0821]  Enc Vitals Group     BP 116/79     Pulse 80     Resp 25     Temp 98     Temp src      SpO2 94%     Weight 170 lb (77.1 kg)     Height 5\' 9"  (1.753 m)    Constitutional: Alert and oriented.  Eyes: Conjunctivae are normal.  ENT      Head: Normocephalic and atraumatic.      Nose: No congestion/rhinnorhea.      Mouth/Throat: Mucous membranes are moist.      Neck: No stridor. Hematological/Lymphatic/Immunilogical: No cervical lymphadenopathy. Cardiovascular: Normal rate, regular rhythm.  No murmurs, rubs, or gallops.  Respiratory: Normal respiratory effort without tachypnea nor retractions. Breath sounds are clear and equal bilaterally. No wheezes/rales/rhonchi. Gastrointestinal: Soft and non tender. No rebound. No guarding.  Genitourinary: Deferred Musculoskeletal: Normal range of motion in all extremities. No lower extremity edema. Neurologic:   Normal speech and language. No gross focal neurologic deficits are appreciated.  Skin:  Skin is warm, dry and intact. No rash noted. Psychiatric: Mood and affect are normal. Speech and behavior are normal. Patient exhibits appropriate insight and judgment.  ____________________________________________    LABS (pertinent positives/negatives)  CBC wbc 8.4, hgb 15.5, plt 169 Trop hs 8 BMP na 141, k 3.7, glu 115, cr 1.28 UA hazy, small leukocytes, >50 wbc, rare bacteria ____________________________________________   EKG  I, Nance Pear, attending physician, personally viewed and interpreted this EKG  EKG Time: 0825  Rate: 80 Rhythm: sinus rhythm Axis: normal Intervals: qtc 453 QRS: narrow, q waves II, III, aVF ST changes: no st elevation Impression: abnormal ekg   ____________________________________________    RADIOLOGY  CXR Mild cardiomegaly. Mild increase in interstitial prominence  ____________________________________________   PROCEDURES  Procedures  ____________________________________________   INITIAL IMPRESSION / ASSESSMENT AND PLAN / ED COURSE  Pertinent labs & imaging results that were available during my care of the patient were reviewed by me and considered in my medical decision making (see chart for details).   Patient presented to the ER because of concern for dizziness. Work up here is concerning for possible dehydration given elevated creatinine and UTI. CXR did raise concern for edema, however I have low suspicion for that given lack of peripheral edema or crackles. Patient was given IVFs here as well as abx. Patient did feel better after IVFs. Was noted to have slightly low 02 sats. Did fall just below 90 on ambulation. States however that he checks his 02 at home and this is roughly his baseline. Denies any shortness of breath. Given that he feels better will plan on discharge home.  ____________________________________________   FINAL  CLINICAL IMPRESSION(S) / ED DIAGNOSES  Final diagnoses:  Lower urinary tract infectious disease  Dehydration     Note: This dictation was prepared with Dragon dictation. Any transcriptional errors that result from this process are unintentional     Nance Pear, MD 08/30/20 1425

## 2020-09-01 LAB — URINE CULTURE: Culture: 20000 — AB

## 2020-09-02 NOTE — Progress Notes (Signed)
ED Antimicrobial Stewardship Positive Culture Follow Up   Patrick Montoya is an 80 y.o. male who presented to [facility] on 08/30/2020 with a chief complaint of dizziness; concern for possibly dehydration with elevated creatinine and UTI. Called pt to inform him to stop taking cephalexin and new antibiotic was called into his pharmacy.   Chief Complaint  Patient presents with  . Dizziness    Recent Results (from the past 720 hour(s))  Urine culture     Status: Abnormal   Collection Time: 08/30/20  8:32 AM   Specimen: Urine, Random  Result Value Ref Range Status   Specimen Description   Final    URINE, RANDOM Performed at [facility], [address], [place], [place] [address]    Special Requests   Final    NONE Performed at [facility], [place], [place] [address]    Culture 20,000 COLONIES/mL PSEUDOMONAS AERUGINOSA (A)  Final   Report Status 09/01/2020 FINAL  Final   Organism ID, Bacteria PSEUDOMONAS AERUGINOSA (A)  Final      Susceptibility   Pseudomonas aeruginosa - MIC*    CEFTAZIDIME 4 SENSITIVE Sensitive     CIPROFLOXACIN <=0.25 SENSITIVE Sensitive     GENTAMICIN 2 SENSITIVE Sensitive     IMIPENEM 2 SENSITIVE Sensitive     PIP/TAZO 8 SENSITIVE Sensitive     CEFEPIME 2 SENSITIVE Sensitive     * 20,000 COLONIES/mL PSEUDOMONAS AERUGINOSA    [x]  Treated with Cephalexin, organism resistant to prescribed antimicrobial  New antibiotic prescription: Ciprofloxacin 500mg  BID x 5 days  ED Provider: Dr. Vladimir Crofts   Sherilyn Banker, PharmD Pharmacy Resident  09/02/2020 10:54 AM

## 2020-09-19 ENCOUNTER — Other Ambulatory Visit: Payer: Self-pay | Admitting: Gastroenterology

## 2020-09-19 DIAGNOSIS — R1312 Dysphagia, oropharyngeal phase: Secondary | ICD-10-CM

## 2020-09-19 DIAGNOSIS — K219 Gastro-esophageal reflux disease without esophagitis: Secondary | ICD-10-CM

## 2020-09-28 ENCOUNTER — Ambulatory Visit: Admission: RE | Admit: 2020-09-28 | Payer: Medicare Other | Source: Ambulatory Visit

## 2020-10-13 ENCOUNTER — Other Ambulatory Visit: Payer: Self-pay

## 2020-10-13 ENCOUNTER — Ambulatory Visit
Admission: RE | Admit: 2020-10-13 | Discharge: 2020-10-13 | Disposition: A | Discharge status: Home / Self Care | Payer: Medicare Other | Source: Ambulatory Visit | Attending: Gastroenterology | Admitting: Gastroenterology

## 2020-10-13 DIAGNOSIS — K219 Gastro-esophageal reflux disease without esophagitis: Secondary | ICD-10-CM | POA: Diagnosis present

## 2020-10-13 DIAGNOSIS — R1312 Dysphagia, oropharyngeal phase: Secondary | ICD-10-CM | POA: Insufficient documentation

## 2020-12-14 ENCOUNTER — Emergency Department: Payer: Medicare Other

## 2020-12-14 ENCOUNTER — Emergency Department
Admission: EM | Admit: 2020-12-14 | Discharge: 2020-12-14 | Disposition: A | Discharge status: Home / Self Care | Payer: Medicare Other | Attending: Emergency Medicine | Admitting: Emergency Medicine

## 2020-12-14 ENCOUNTER — Other Ambulatory Visit: Payer: Self-pay

## 2020-12-14 DIAGNOSIS — Y900 Blood alcohol level of less than 20 mg/100 ml: Secondary | ICD-10-CM | POA: Diagnosis not present

## 2020-12-14 DIAGNOSIS — Z85828 Personal history of other malignant neoplasm of skin: Secondary | ICD-10-CM | POA: Diagnosis not present

## 2020-12-14 DIAGNOSIS — Z7951 Long term (current) use of inhaled steroids: Secondary | ICD-10-CM | POA: Diagnosis not present

## 2020-12-14 DIAGNOSIS — J449 Chronic obstructive pulmonary disease, unspecified: Secondary | ICD-10-CM | POA: Insufficient documentation

## 2020-12-14 DIAGNOSIS — N3000 Acute cystitis without hematuria: Secondary | ICD-10-CM | POA: Insufficient documentation

## 2020-12-14 DIAGNOSIS — Z7982 Long term (current) use of aspirin: Secondary | ICD-10-CM | POA: Diagnosis not present

## 2020-12-14 DIAGNOSIS — G20 Parkinson's disease: Secondary | ICD-10-CM | POA: Insufficient documentation

## 2020-12-14 DIAGNOSIS — F17210 Nicotine dependence, cigarettes, uncomplicated: Secondary | ICD-10-CM | POA: Insufficient documentation

## 2020-12-14 DIAGNOSIS — B9689 Other specified bacterial agents as the cause of diseases classified elsewhere: Secondary | ICD-10-CM | POA: Diagnosis not present

## 2020-12-14 DIAGNOSIS — F1390 Sedative, hypnotic, or anxiolytic use, unspecified, uncomplicated: Secondary | ICD-10-CM | POA: Diagnosis not present

## 2020-12-14 DIAGNOSIS — Z8669 Personal history of other diseases of the nervous system and sense organs: Secondary | ICD-10-CM

## 2020-12-14 DIAGNOSIS — I10 Essential (primary) hypertension: Secondary | ICD-10-CM | POA: Diagnosis not present

## 2020-12-14 DIAGNOSIS — R443 Hallucinations, unspecified: Secondary | ICD-10-CM | POA: Diagnosis present

## 2020-12-14 DIAGNOSIS — Z79899 Other long term (current) drug therapy: Secondary | ICD-10-CM | POA: Diagnosis not present

## 2020-12-14 DIAGNOSIS — R441 Visual hallucinations: Secondary | ICD-10-CM | POA: Insufficient documentation

## 2020-12-14 LAB — URINALYSIS, COMPLETE (UACMP) WITH MICROSCOPIC
Bilirubin Urine: NEGATIVE
Glucose, UA: NEGATIVE mg/dL
Hgb urine dipstick: NEGATIVE
Ketones, ur: 5 mg/dL — AB
Nitrite: NEGATIVE
Protein, ur: 30 mg/dL — AB
Specific Gravity, Urine: 1.027 (ref 1.005–1.030)
WBC, UA: >50 WBC/hpf — ABNORMAL HIGH (ref 0–5)
pH: 5 (ref 5.0–8.0)

## 2020-12-14 LAB — SALICYLATE LEVEL: Salicylate Lvl: <7 mg/dL — ABNORMAL LOW (ref 7.0–30.0)

## 2020-12-14 LAB — COMPREHENSIVE METABOLIC PANEL
ALT: 25 U/L (ref 0–44)
AST: 77 U/L — ABNORMAL HIGH (ref 15–41)
Albumin: 3.8 g/dL (ref 3.5–5.0)
Alkaline Phosphatase: 79 U/L (ref 38–126)
Anion gap: 10 (ref 5–15)
BUN: 27 mg/dL — ABNORMAL HIGH (ref 8–23)
CO2: 26 mmol/L (ref 22–32)
Calcium: 9.2 mg/dL (ref 8.9–10.3)
Chloride: 105 mmol/L (ref 98–111)
Creatinine, Ser: 1.24 mg/dL (ref 0.61–1.24)
GFR, Estimated: 59 mL/min — ABNORMAL LOW (ref >60)
Glucose, Bld: 146 mg/dL — ABNORMAL HIGH (ref 70–99)
Potassium: 3.5 mmol/L (ref 3.5–5.1)
Sodium: 141 mmol/L (ref 135–145)
Total Bilirubin: 1.2 mg/dL (ref 0.3–1.2)
Total Protein: 7.1 g/dL (ref 6.5–8.1)

## 2020-12-14 LAB — CBC
HCT: 42.9 % (ref 39.0–52.0)
Hemoglobin: 15 g/dL (ref 13.0–17.0)
MCH: 35.1 pg — ABNORMAL HIGH (ref 26.0–34.0)
MCHC: 35 g/dL (ref 30.0–36.0)
MCV: 100.5 fL — ABNORMAL HIGH (ref 80.0–100.0)
Platelets: 174 10*3/uL (ref 150–400)
RBC: 4.27 MIL/uL (ref 4.22–5.81)
RDW: 13.6 % (ref 11.5–15.5)
WBC: 7.1 10*3/uL (ref 4.0–10.5)
nRBC: 0 % (ref 0.0–0.2)

## 2020-12-14 LAB — URINE DRUG SCREEN, QUALITATIVE (ARMC ONLY)
Amphetamines, Ur Screen: POSITIVE — AB
Barbiturates, Ur Screen: NOT DETECTED
Benzodiazepine, Ur Scrn: POSITIVE — AB
Cannabinoid 50 Ng, Ur ~~LOC~~: NOT DETECTED
Cocaine Metabolite,Ur ~~LOC~~: NOT DETECTED
MDMA (Ecstasy)Ur Screen: NOT DETECTED
Methadone Scn, Ur: NOT DETECTED
Opiate, Ur Screen: NOT DETECTED
Phencyclidine (PCP) Ur S: NOT DETECTED
Tricyclic, Ur Screen: POSITIVE — AB

## 2020-12-14 LAB — ETHANOL: Alcohol, Ethyl (B): <10 mg/dL (ref <10)

## 2020-12-14 LAB — ACETAMINOPHEN LEVEL: Acetaminophen (Tylenol), Serum: <10 ug/mL — ABNORMAL LOW (ref 10–30)

## 2020-12-14 MED ORDER — CEFDINIR 300 MG PO CAPS: 300.0000 mg | ORAL_CAPSULE | Freq: Two times a day (BID) | ORAL | 0 refills | Status: AC

## 2020-12-14 NOTE — ED Provider Notes (Signed)
[facility] Emergency Department Provider Note   ____________________________________________   Event Date/Time   First MD Initiated Contact with Patient 12/14/20 1132     (approximate)  I have reviewed the triage vital signs and the nursing notes.   HISTORY  Chief Complaint Hallucinations    HPI Patrick Montoya is a 80 y.o. male who presents complaining of intermittent hallucinations  LOCATION: Generalized DURATION: 3 days TIMING: Intermittent SEVERITY: Mild QUALITY: Hallucinations CONTEXT: Patient states that he tiled a floor 3 days ago in a room that he believes is not well ventilated and may have been exposed to a toxic gas.  Since that time patient states that he has been having intermittent hallucinations of speaking to his wife as well as diminutive people MODIFYING FACTORS: Denies any exacerbating or relieving factors however does state that these hallucinations occur more frequently in the evening than in the morning ASSOCIATED SYMPTOMS: Dysuria   Per medical record review, patient does have history of Parkinson's disease and frequent urinary tract infections          Past Medical History:  Diagnosis Date   Basal cell carcinoma 07/25/2015   L zygoma inf to lat canthus   Basal cell carcinoma 06/11/2018   L nasal ala   BPH (benign prostatic hyperplasia)    COPD (chronic obstructive pulmonary disease) (HCC)    Elevated liver enzymes    Emphysema of lung (HCC)    Gastric ulcer    GERD (gastroesophageal reflux disease)    Hepatitis B    Hypertension    Parkinson's disease ([place])    Prostate hypertrophy    Rotator cuff tear right   UTI (urinary tract infection)     Patient Active Problem List   Diagnosis Date Noted   Sternal fracture with retrosternal contusion, closed, initial encounter 10/31/2017   Chronic bronchitis ([place]) 05/28/2015   Gastro-esophageal reflux disease without esophagitis 05/26/2015   Insomnia, 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/11/2014   Chronic obstructive pulmonary disease ([place]) 04/11/2014   Benign prostatic hypertrophy without urinary obstruction 01/21/2014   Acute bronchitis with chronic obstructive pulmonary disease (COPD) ([place]) 01/21/2014   Essential (primary) hypertension 01/21/2014   SINUSITIS- ACUTE-NOS 07/03/2007   RENAL CALCULUS 06/05/2007   HYPERTENSION 02/27/2007   COPD 02/27/2007   GERD 02/27/2007   PEPTIC ULCER DISEASE 02/27/2007   BENIGN PROSTATIC HYPERTROPHY 02/27/2007   BURSITIS, RIGHT SHOULDER 02/27/2007   COLONIC POLYPS, HX OF 02/27/2007   FROZEN RIGHT SHOULDER 12/05/2006    Past Surgical History:  Procedure Laterality Date   CHOLECYSTECTOMY     COLONOSCOPY WITH PROPOFOL N/A 10/20/2015   Procedure: COLONOSCOPY WITH PROPOFOL;  Surgeon: Manya Silvas, MD;  Location: [facility] ENDOSCOPY;  Service: Endoscopy;  Laterality: N/A;   ESOPHAGOGASTRODUODENOSCOPY (EGD) WITH PROPOFOL N/A 10/20/2015   Procedure: ESOPHAGOGASTRODUODENOSCOPY (EGD) WITH PROPOFOL;  Surgeon: Manya Silvas, MD;  Location: [facility] ENDOSCOPY;  Service: Endoscopy;  Laterality: N/A;   GREEN LIGHT LASER TURP (TRANSURETHRAL RESECTION OF PROSTATE N/A 11/17/2018   Procedure: GREEN LIGHT LASER TURP (TRANSURETHRAL RESECTION OF PROSTATE;  Surgeon: Royston Cowper, MD;  Location: [facility] ORS;  Service: Urology;  Laterality: N/A;   INGUINAL HERNIA REPAIR Right 02/18/2019   Procedure: HERNIA REPAIR INGUINAL ADULT;  Surgeon: Royston Cowper, MD;  Location: [facility] ORS;  Service: Urology;  Laterality: Right;   INSERTION OF MESH Right 02/18/2019   Procedure: INSERTION OF MESH;  Surgeon: Royston Cowper, MD;  Location: [facility] ORS;  Service: Urology;  Laterality: Right;   TONSILLECTOMY  TRANSURETHRAL MICROWAVE THERAPY      Prior to Admission medications   Medication Sig Start Date End Date Taking? Authorizing Provider  cefdinir (OMNICEF) 300 MG capsule Take 1 capsule (300 mg total) by mouth 2 (two) times daily for 5 days. 12/14/20 12/19/20 Yes Jonmarc Bodkin,  Vista Lawman, MD  albuterol (PROVENTIL HFA;VENTOLIN HFA) 108 (90 Base) MCG/ACT inhaler Inhale 2 puffs into the lungs every 4 (four) hours as needed for wheezing or shortness of breath.    [provider]  albuterol (PROVENTIL) (2.5 MG/3ML) 0.083% nebulizer solution Take 5 mg by nebulization at bedtime.     [provider]  ALPRAZolam Duanne Moron) 0.5 MG tablet Take 0.5 mg by mouth at bedtime.     [provider]  amLODipine (NORVASC) 2.5 MG tablet Take 2.5 mg by mouth daily. 08/02/18   [provider]  aspirin 81 MG tablet Take 81 mg by mouth daily.      [provider]  carbidopa-levodopa (SINEMET CR) 50-200 MG tablet Take 1 tablet by mouth 3 (three) times daily.     [provider]  cephALEXin (KEFLEX) 500 MG capsule Take 1 capsule (500 mg total) by mouth 4 (four) times daily. 02/18/19   Royston Cowper, MD  ciprofloxacin (CIPRO) 500 MG tablet Take 1 tablet (500 mg total) by mouth 2 (two) times daily. Patient not taking: Reported on 02/05/2019 11/17/18   Royston Cowper, MD  Cyanocobalamin ([employer]S VITAMIN B-12) 2000 MCG TBCR Take 2,000 mcg by mouth daily.    [provider]  docusate sodium (COLACE) 100 MG capsule Take 100 mg by mouth daily.    [provider]  doxazosin (CARDURA) 4 MG tablet Take 4 mg by mouth daily.     [provider]  KLOR-CON M10 10 MEQ tablet Take 30 mEq by mouth daily.  10/31/15   [provider]  Meth-Hyo-M Bl-Na Phos-Ph Sal (URIBEL) 118 MG CAPS Take 1 capsule (118 mg total) by mouth every 6 (six) hours as needed. Patient not taking: Reported on 02/05/2019 11/17/18   Royston Cowper, MD  NUCYNTA 50 MG tablet Take 1 tablet (50 mg total) by mouth every 6 (six) hours as needed. 1 TO 2 TABS Q 6 HOURS PRN PAIN 02/18/19   Royston Cowper, MD  omeprazole (PRILOSEC) 40 MG capsule Take 40 mg by mouth daily.  11/06/15   [provider]  SUMAtriptan (IMITREX) 50 MG tablet Take 50 mg by mouth every 2 (two)  hours as needed for migraine (max 2 tablets/24hrs). May repeat in 2 hours if headache persists or recurs.    [provider]  tiotropium (SPIRIVA HANDIHALER) 18 MCG inhalation capsule Take 1 capsule by mouth daily.  05/31/16   [provider]    Allergies Patient has no known allergies.  Family History  Problem Relation Age of Onset   Colon cancer Mother    Hepatitis Son    Prostate cancer Neg Hx    Kidney cancer Neg Hx     Social History Social History   Tobacco Use   Smoking status: Light Smoker    Packs/day: 1.00    Years: 62.00    Pack years: 62.00    Types: Cigarettes   Smokeless tobacco: Never  Vaping Use   Vaping Use: Never used  Substance Use Topics   Alcohol use: Yes    Comment: occasional   Drug use: No    Review of Systems Constitutional: No fever/chills Eyes: No visual changes. ENT: No  sore throat. Cardiovascular: Denies chest pain. Respiratory: Denies shortness of breath. Gastrointestinal: No abdominal pain.  No nausea, no vomiting.  No diarrhea. Genitourinary: Negative for dysuria. Musculoskeletal: Negative for acute arthralgias Skin: Negative for rash. Neurological: Negative for headaches, weakness/numbness/paresthesias in any extremity Psychiatric: Positive for visual hallucinations.  Negative for suicidal ideation/homicidal ideation   ____________________________________________   PHYSICAL EXAM:  VITAL SIGNS: ED Triage Vitals  Enc Vitals Group     BP 12/14/20 0923 96/69     Pulse Rate 12/14/20 0923 76     Resp 12/14/20 0923 15     Temp 12/14/20 0923 97.6 F (36.4 C)     Temp Source 12/14/20 0923 Oral     SpO2 12/14/20 0923 95 %     Weight 12/14/20 0924 165 lb (74.8 kg)     Height 12/14/20 0924 '5\' 8"'$  (1.727 m)     Head Circumference --      Peak Flow --      Pain Score 12/14/20 0924 5     Pain Loc --      Pain Edu? --      Excl. in [place]? --    Constitutional: Alert and oriented. Well appearing and in no acute  distress. Eyes: Conjunctivae are normal. PERRL. Head: Atraumatic. Nose: No congestion/rhinnorhea. Mouth/Throat: Mucous membranes are moist. Neck: No stridor Cardiovascular: Grossly normal heart sounds.  Good peripheral circulation. Respiratory: Normal respiratory effort.  No retractions. Gastrointestinal: Soft and nontender. No distention. Musculoskeletal: No obvious deformities Neurologic:  Normal speech and language. No gross focal neurologic deficits are appreciated. Skin:  Skin is warm and dry. No rash noted. Psychiatric: Mood and affect are normal. Speech and behavior are normal.  Patient currently denies any auditory/visual hallucinations  ____________________________________________   LABS (all labs ordered are listed, but only abnormal results are displayed)  Labs Reviewed  COMPREHENSIVE METABOLIC PANEL - Abnormal; Notable for the following components:      Result Value   Glucose, Bld 146 (*)    BUN 27 (*)    AST 77 (*)    GFR, Estimated 59 (*)    All other components within normal limits  SALICYLATE LEVEL - Abnormal; Notable for the following components:   Salicylate Lvl [id number] (*)    All other components within normal limits  ACETAMINOPHEN LEVEL - Abnormal; Notable for the following components:   Acetaminophen (Tylenol), Serum <10 (*)    All other components within normal limits  CBC - Abnormal; Notable for the following components:   MCV 100.5 (*)    MCH 35.1 (*)    All other components within normal limits  URINE DRUG SCREEN, QUALITATIVE ([facility] ONLY) - Abnormal; Notable for the following components:   Tricyclic, Ur Screen POSITIVE (*)    Amphetamines, Ur Screen POSITIVE (*)    Benzodiazepine, Ur Scrn POSITIVE (*)    All other components within normal limits  URINALYSIS, COMPLETE (UACMP) WITH MICROSCOPIC - Abnormal; Notable for the following components:   Color, Urine AMBER (*)    APPearance CLOUDY (*)    Ketones, ur 5 (*)    Protein, ur 30 (*)    Leukocytes,Ua  MODERATE (*)    WBC, UA >50 (*)    Bacteria, UA MANY (*)    All other components within normal limits  URINE CULTURE  ETHANOL   ____________________________________________  EKG  ED ECG REPORT I, Naaman Plummer, the attending physician, personally viewed and interpreted this ECG.  Date: 12/14/2020 EKG Time: 0930 Rate: 86 Rhythm: normal  sinus rhythm QRS Axis: normal Intervals: normal ST/T Wave abnormalities: normal Narrative Interpretation: no evidence of acute ischemia  ____________________________________________  RADIOLOGY  ED MD interpretation: 2 view chest x-ray shows no evidence of acute abnormalities including no pneumonia, pneumothorax, or widened mediastinum.  There is evidence of mild bibasilar predominant interstitial opacities that are favored to be chronic edema  CT of the head without contrast shows no evidence of acute abnormalities including no intracerebral hemorrhage, obvious masses, or significant edema  Official radiology report(s): DG Chest 2 View  Result Date: 12/14/2020 CLINICAL DATA:  Chest pain. EXAM: CHEST - 2 VIEW COMPARISON:  August 30, 2020. FINDINGS: Cardiomediastinal silhouette is within normal limits and similar to prior. Mild bibasilar predominant interstitial opacities, slightly more conspicuous than on the prior. No confluent consolidation. No visible pleural effusions or pneumothorax. IMPRESSION: Mild bibasilar predominant interstitial opacities, slightly more conspicuous than on the prior. While likely in part chronic, mild interstitial edema or atypical infection is not excluded. Electronically Signed   By: Margaretha Sheffield MD   On: 12/14/2020 10:21   CT Head Wo Contrast  Result Date: 12/14/2020 CLINICAL DATA:  Mental status change EXAM: CT HEAD WITHOUT CONTRAST TECHNIQUE: Contiguous axial images were obtained from the base of the skull through the vertex without intravenous contrast. COMPARISON:  CT head dated December 04, 2017 FINDINGS: Brain: No  evidence of acute infarction, hemorrhage, hydrocephalus, extra-axial collection or mass lesion/mass effect. Chronic white matter ischemic change. Vascular: No hyperdense vessel or unexpected calcification. Skull: Normal. Negative for fracture or focal lesion. Sinuses/Orbits: No acute finding. Other: None. IMPRESSION: No acute intracranial process. Electronically Signed   By: Yetta Glassman MD   On: 12/14/2020 13:38    ____________________________________________   PROCEDURES  Procedure(s) performed (including Critical Care):  Procedures   ____________________________________________   INITIAL IMPRESSION / ASSESSMENT AND PLAN / ED COURSE  As part of my medical decision making, I reviewed the following data within the electronic medical record, if available:  Nursing notes reviewed and incorporated, Labs reviewed, EKG interpreted, Old chart reviewed, Radiograph reviewed and Notes from prior ED visits reviewed and incorporated        No e/o epididymo-orchitis on exam and low suspicion for rectal abscess, prostatitis, other GU deep space infection, gonorrhea/chlamydia. Unlikely Infected Urolithiasis, AAA, cholecystitis, pancreatitis, SBO, appendicitis, or other acute abdomen.  Patient is not currently hallucinating however the intermittent hallucinations may be caused by an exacerbation of patient's Parkinson's by the urinary tract infection especially given the description of [place] hallucinations Workup: UA: None Rx: Cefdinir 300 mg twice daily x5 days  Disposition: Discharge home. SRP discussed. Advise follow up with primary care provider within 24-72 hours.      ____________________________________________   FINAL CLINICAL IMPRESSION(S) / ED DIAGNOSES  Final diagnoses:  Episodes of formed visual hallucinations  Acute cystitis without hematuria  History of Parkinson's disease     ED Discharge Orders          Ordered    cefdinir (OMNICEF) 300 MG capsule  2 times  daily        12/14/20 1402             Note:  This document was prepared using Dragon voice recognition software and may include unintentional dictation errors.    Naaman Plummer, MD 12/14/20 1425

## 2020-12-14 NOTE — ED Notes (Signed)
Patient transported to CT 

## 2020-12-14 NOTE — ED Notes (Signed)
Pt sitting in bed with ear buds in and playing on his phone. VSS. Will continue to monitor.

## 2020-12-14 NOTE — ED Notes (Signed)
Patient is resting comfortably while playing on his phone. He wishes to have a sandwich. There is not any available. Offered other food he declines at this time. Will continue to monitor. NAD

## 2020-12-14 NOTE — ED Triage Notes (Signed)
Pt here with hallucinations x3 days. Pt states that he put some new flooring down and he thinks that the chemicals in that are causing him to hallucinate. Pt was sent here from his urologist office. Pt states things are moving that are not supposed to be moving. Pt had a fall and denies hitting his head but hit his chest. Pt takes medication for Parkinsons'.

## 2020-12-15 LAB — URINE CULTURE: Culture: >=10000 — AB

## 2021-01-10 ENCOUNTER — Other Ambulatory Visit (HOSPITAL_COMMUNITY): Payer: Self-pay | Admitting: Specialist

## 2021-01-10 ENCOUNTER — Other Ambulatory Visit: Payer: Self-pay | Admitting: Specialist

## 2021-01-10 DIAGNOSIS — J449 Chronic obstructive pulmonary disease, unspecified: Secondary | ICD-10-CM

## 2021-01-23 ENCOUNTER — Encounter: Payer: Self-pay | Admitting: Internal Medicine

## 2021-01-24 ENCOUNTER — Encounter: Payer: Self-pay | Admitting: Internal Medicine

## 2021-01-24 ENCOUNTER — Ambulatory Visit: Payer: Medicare Other | Admitting: Anesthesiology

## 2021-01-24 ENCOUNTER — Ambulatory Visit
Admission: RE | Admit: 2021-01-24 | Discharge: 2021-01-24 | Disposition: A | Discharge status: Home / Self Care | Payer: Medicare Other | Attending: Internal Medicine | Admitting: Internal Medicine

## 2021-01-24 ENCOUNTER — Encounter
Admission: RE | Disposition: A | Discharge status: Home / Self Care | Payer: Self-pay | Source: Home / Self Care | Attending: Internal Medicine

## 2021-01-24 DIAGNOSIS — Z8619 Personal history of other infectious and parasitic diseases: Secondary | ICD-10-CM | POA: Diagnosis not present

## 2021-01-24 DIAGNOSIS — Z7982 Long term (current) use of aspirin: Secondary | ICD-10-CM | POA: Insufficient documentation

## 2021-01-24 DIAGNOSIS — K5730 Diverticulosis of large intestine without perforation or abscess without bleeding: Secondary | ICD-10-CM | POA: Diagnosis not present

## 2021-01-24 DIAGNOSIS — J439 Emphysema, unspecified: Secondary | ICD-10-CM | POA: Diagnosis not present

## 2021-01-24 DIAGNOSIS — Z79899 Other long term (current) drug therapy: Secondary | ICD-10-CM | POA: Diagnosis not present

## 2021-01-24 DIAGNOSIS — K640 First degree hemorrhoids: Secondary | ICD-10-CM | POA: Insufficient documentation

## 2021-01-24 DIAGNOSIS — Z8711 Personal history of peptic ulcer disease: Secondary | ICD-10-CM | POA: Diagnosis not present

## 2021-01-24 DIAGNOSIS — I10 Essential (primary) hypertension: Secondary | ICD-10-CM | POA: Diagnosis not present

## 2021-01-24 DIAGNOSIS — N400 Enlarged prostate without lower urinary tract symptoms: Secondary | ICD-10-CM | POA: Insufficient documentation

## 2021-01-24 DIAGNOSIS — D125 Benign neoplasm of sigmoid colon: Secondary | ICD-10-CM | POA: Insufficient documentation

## 2021-01-24 DIAGNOSIS — G20 Parkinson's disease: Secondary | ICD-10-CM | POA: Insufficient documentation

## 2021-01-24 DIAGNOSIS — K219 Gastro-esophageal reflux disease without esophagitis: Secondary | ICD-10-CM | POA: Insufficient documentation

## 2021-01-24 DIAGNOSIS — Z85828 Personal history of other malignant neoplasm of skin: Secondary | ICD-10-CM | POA: Insufficient documentation

## 2021-01-24 DIAGNOSIS — Z1211 Encounter for screening for malignant neoplasm of colon: Secondary | ICD-10-CM | POA: Diagnosis not present

## 2021-01-24 HISTORY — PX: COLONOSCOPY WITH PROPOFOL: SHX5780

## 2021-01-24 SURGERY — COLONOSCOPY WITH PROPOFOL
Anesthesia: General

## 2021-01-24 MED ORDER — MIDAZOLAM HCL 2 MG/2ML IJ SOLN
INTRAMUSCULAR | Status: DC | PRN
  Administered 2021-01-24 (×4): .5 mg via INTRAVENOUS

## 2021-01-24 MED ORDER — PROPOFOL 500 MG/50ML IV EMUL
INTRAVENOUS | Status: DC | PRN
  Administered 2021-01-24: 75 ug/kg/min via INTRAVENOUS

## 2021-01-24 MED ORDER — SODIUM CHLORIDE 0.9 % IV SOLN
INTRAVENOUS | Status: DC
  Administered 2021-01-24: 20 mL/h via INTRAVENOUS

## 2021-01-24 MED ORDER — MIDAZOLAM HCL 2 MG/2ML IJ SOLN
INTRAMUSCULAR | Status: AC
  Filled 2021-01-24: qty 2

## 2021-01-24 MED ORDER — PROPOFOL 10 MG/ML IV BOLUS
INTRAVENOUS | Status: DC | PRN
  Administered 2021-01-24: 30 mg via INTRAVENOUS

## 2021-01-24 NOTE — Interval H&P Note (Signed)
History and Physical Interval Note:  01/24/2021 11:26 AM  Patrick Montoya  has presented today for surgery, with the diagnosis of PERSONAL HX.OF COLON POLYPS,FAMILY HX.OF COLON CANCER,MOTHER.  The various methods of treatment have been discussed with the patient and family. After consideration of risks, benefits and other options for treatment, the patient has consented to  Procedure(s): COLONOSCOPY WITH PROPOFOL (N/A) as a surgical intervention.  The patient's history has been reviewed, patient examined, no change in status, stable for surgery.  I have reviewed the patient's chart and labs.  Questions were answered to the patient's satisfaction.     [place], [place]

## 2021-01-24 NOTE — H&P (Signed)
Outpatient short stay form Pre-procedure 01/24/2021 11:22 AM Patrick Montoya K. Alice Reichert, M.D.  Primary Physician: Tracie Harrier  Reason for visit:  Personal history of adenomatous colon polyps - 2017.  History of present illness:                            Patient presents for colonoscopy for a personal hx of colon polyps. The patient denies abdominal pain, abnormal weight loss or rectal bleeding.      Current Facility-Administered Medications:    0.9 %  sodium chloride infusion, , Intravenous, Continuous, [place], Benay Pike, MD, Last Rate: 20 mL/hr at 01/24/21 1058, 20 mL/hr at 01/24/21 1058  Medications Prior to Admission  Medication Sig Dispense Refill Last Dose   albuterol (PROVENTIL HFA;VENTOLIN HFA) 108 (90 Base) MCG/ACT inhaler Inhale 2 puffs into the lungs every 4 (four) hours as needed for wheezing or shortness of breath.   Past Week   albuterol (PROVENTIL) (2.5 MG/3ML) 0.083% nebulizer solution Take 5 mg by nebulization at bedtime.   Past Week   ALPRAZolam (XANAX) 0.5 MG tablet Take 0.5 mg by mouth at bedtime.   Past Week   amLODipine (NORVASC) 2.5 MG tablet Take 2.5 mg by mouth daily.   01/24/2021 at 0600   aspirin 81 MG tablet Take 81 mg by mouth daily.     Past Week   carbidopa-levodopa (SINEMET CR) 50-200 MG tablet Take 1 tablet by mouth 3 (three) times daily.    01/24/2021 at 0600   cephALEXin (KEFLEX) 500 MG capsule Take 1 capsule (500 mg total) by mouth 4 (four) times daily. 12 capsule 1 Past Week   Cyanocobalamin ([employer]S VITAMIN B-12) 2000 MCG TBCR Take 2,000 mcg by mouth daily.   Past Week   docusate sodium (COLACE) 100 MG capsule Take 100 mg by mouth daily.   Past Week   doxazosin (CARDURA) 4 MG tablet Take 4 mg by mouth daily.    01/24/2021 at 0600   KLOR-CON M10 10 MEQ tablet Take 30 mEq by mouth daily.    Past Week   Meth-Hyo-M Bl-Na Phos-Ph Sal (URIBEL) 118 MG CAPS Take 1 capsule (118 mg total) by mouth every 6 (six) hours as needed. 40 capsule 3 Past Week   omeprazole  (PRILOSEC) 40 MG capsule Take 40 mg by mouth daily.    Past Week   QUEtiapine (SEROQUEL) 50 MG tablet Take 50 mg by mouth at bedtime.   Past Week   rizatriptan (MAXALT) 10 MG tablet Take 10 mg by mouth as needed for migraine. May repeat in 2 hours if needed   Past Week   SUMAtriptan (IMITREX) 50 MG tablet Take 50 mg by mouth every 2 (two) hours as needed for migraine (max 2 tablets/24hrs). May repeat in 2 hours if headache persists or recurs.   Past Week   tiotropium (SPIRIVA) 18 MCG inhalation capsule Take 1 capsule by mouth daily.    Past Week   ciprofloxacin (CIPRO) 500 MG tablet Take 1 tablet (500 mg total) by mouth 2 (two) times daily. (Patient not taking: Reported on 02/05/2019) 6 tablet 0    NUCYNTA 50 MG tablet Take 1 tablet (50 mg total) by mouth every 6 (six) hours as needed. 1 TO 2 TABS Q 6 HOURS PRN PAIN 20 tablet 0      No Known Allergies   Past Medical History:  Diagnosis Date   Basal cell carcinoma 07/25/2015   L zygoma inf to lat canthus  Basal cell carcinoma 06/11/2018   L nasal ala   BPH (benign prostatic hyperplasia)    COPD (chronic obstructive pulmonary disease) (HCC)    Elevated liver enzymes    Emphysema of lung (HCC)    Gastric ulcer    GERD (gastroesophageal reflux disease)    Hepatitis B    Hypertension    Parkinson's disease (HCC)    Prostate hypertrophy    Rotator cuff tear right   UTI (urinary tract infection)     Review of systems:  Otherwise negative.    Physical Exam  Gen: Alert, oriented. Appears stated age.  HEENT: [place]/AT. PERRLA. Lungs: CTA, no wheezes. CV: RR nl S1, S2. Abd: soft, benign, no masses. BS+ Ext: No edema. Pulses 2+    Planned procedures: Proceed with colonoscopy. The patient understands the nature of the planned procedure, indications, risks, alternatives and potential complications including but not limited to bleeding, infection, perforation, damage to internal organs and possible oversedation/side effects from  anesthesia. The patient agrees and gives consent to proceed.  Please refer to procedure notes for findings, recommendations and patient disposition/instructions.     Tela Kotecki K. Alice Reichert, M.D. Gastroenterology 01/24/2021  11:22 AM

## 2021-01-24 NOTE — Anesthesia Postprocedure Evaluation (Signed)
Anesthesia Post Note  Patient: Patrick Montoya  Procedure(s) Performed: COLONOSCOPY WITH PROPOFOL  Patient location during evaluation: Endoscopy Anesthesia Type: General Level of consciousness: awake and alert Pain management: pain level controlled Vital Signs Assessment: post-procedure vital signs reviewed and stable Respiratory status: spontaneous breathing, nonlabored ventilation, respiratory function stable and patient connected to nasal cannula oxygen Cardiovascular status: blood pressure returned to baseline and stable Postop Assessment: no apparent nausea or vomiting Anesthetic complications: no   No notable events documented.   Last Vitals:  Vitals:   01/24/21 1213 01/24/21 1223  BP: 117/74 134/85  Pulse:    Resp: 20   Temp:    SpO2: 94%     Last Pain:  Vitals:   01/24/21 1223  TempSrc:   PainSc: 0-No pain                 Precious Haws Cyris Maalouf

## 2021-01-24 NOTE — Anesthesia Preprocedure Evaluation (Signed)
Anesthesia Evaluation  Patient identified by MRN, date of birth, ID band Patient awake    Reviewed: Allergy & Precautions, H&P , NPO status , Patient's Chart, lab work & pertinent test results  History of Anesthesia Complications Negative for: history of anesthetic complications  Airway Mallampati: III  TM Distance: <3 FB Neck ROM: limited    Dental  (+) Poor Dentition, Missing, Upper Dentures   Pulmonary shortness of breath and with exertion, COPD,  COPD inhaler, Current Smoker, former smoker,    Pulmonary exam normal breath sounds clear to auscultation       Cardiovascular Exercise Tolerance: Good hypertension, (-) angina+ DOE  (-) Past MI Normal cardiovascular exam Rhythm:regular Rate:Normal     Neuro/Psych Parkinson's Dz  Neuromuscular disease negative psych ROS   GI/Hepatic PUD, GERD  Medicated and Controlled,(+) Hepatitis -, B  Endo/Other  negative endocrine ROS  Renal/GU Renal disease  negative genitourinary   Musculoskeletal  (+) Arthritis ,   Abdominal   Peds negative pediatric ROS (+)  Hematology negative hematology ROS (+)   Anesthesia Other Findings Past Medical History:   COPD (chronic obstructive pulmonary disease) (*              GERD (gastroesophageal reflux disease)                       Hypertension                                                 Gastric ulcer                                                Prostate hypertrophy                                         Rotator cuff tear                               right       Past Surgical History:   TONSILLECTOMY                                                 CHOLECYSTECTOMY                                              BMI    Body Mass Index   23.67 kg/m 2      Reproductive/Obstetrics negative OB ROS                             Anesthesia Physical  Anesthesia Plan  ASA: III  Anesthesia Plan: General    Post-op Pain Management:    Induction: Intravenous  PONV Risk Score and Plan:   Airway Management Planned: Oral ETT  Additional Equipment:   Intra-op Plan:   Post-operative Plan: Extubation in OR  Informed Consent: I have reviewed the patients History and Physical, chart, labs and discussed the procedure including the risks, benefits and alternatives for the proposed anesthesia with the patient or authorized representative who has indicated his/her understanding and acceptance.     Dental Advisory Given  Plan Discussed with: Anesthesiologist, CRNA and Surgeon  Anesthesia Plan Comments: (Patient consented for risks of anesthesia including but not limited to:  - adverse reactions to medications - risk of airway placement if required - damage to eyes, teeth, lips or other oral mucosa - nerve damage due to positioning  - sore throat or hoarseness - Damage to heart, brain, nerves, lungs, other parts of body or loss of life  Patient voiced understanding.)        Anesthesia Quick Evaluation

## 2021-01-24 NOTE — Transfer of Care (Signed)
Immediate Anesthesia Transfer of Care Note  Patient: Patrick Montoya  Procedure(s) Performed: COLONOSCOPY WITH PROPOFOL  Patient Location: PACU  Anesthesia Type:General  Level of Consciousness: awake, alert  and oriented  Airway & Oxygen Therapy: Patient Spontanous Breathing and Patient connected to nasal cannula oxygen  Post-op Assessment: Report given to RN, Post -op Vital signs reviewed and stable and Patient moving all extremities  Post vital signs: Reviewed and stable  Last Vitals:  Vitals Value Taken Time  BP 98/59 01/24/21 1204  Temp    Pulse 70 01/24/21 1204  Resp 17 01/24/21 1204  SpO2 95 % 01/24/21 1204    Last Pain:  Vitals:   01/24/21 1038  TempSrc: Temporal  PainSc: 0-No pain         Complications: No notable events documented.

## 2021-01-24 NOTE — Interval H&P Note (Signed)
History and Physical Interval Note:  01/24/2021 11:26 AM  Patrick Montoya  has presented today for surgery, with the diagnosis of PERSONAL HX.OF COLON POLYPS,FAMILY HX.OF COLON CANCER,MOTHER.  The various methods of treatment have been discussed with the patient and family. After consideration of risks, benefits and other options for treatment, the patient has consented to  Procedure(s): COLONOSCOPY WITH PROPOFOL (N/A) as a surgical intervention.  The patient's history has been reviewed, patient examined, no change in status, stable for surgery.  I have reviewed the patient's chart and labs.  Questions were answered to the patient's satisfaction.     [place], [place]

## 2021-01-24 NOTE — Op Note (Signed)
[facility]enter[facility] Patient Name: Patrick Montoya Procedure Date: 01/24/2021 11:32 AM MRN: [id number] Account #: [id number] Date of Birth: [birth year] Admit Type: Outpatient Age: 80 Room: [facility] ENDO ROOM 2 Gender: Male Note Status: Finalized Instrument Name: Jasper Riling [id number] Procedure:             Colonoscopy Indications:           Surveillance: Personal history of adenomatous polyps                         on last colonoscopy > 5 years ago Providers:             Lorie Apley K. Alice Reichert MD, MD Referring MD:          [facility]                        Tracie Harrier, MD (Referring MD) Medicines:             Propofol per Anesthesia Complications:         No immediate complications. Procedure:             Pre-Anesthesia Assessment:                        - The risks and benefits of the procedure and the                         sedation options and risks were discussed with the                         patient. All questions were answered and informed                         consent was obtained.                        - Patient identification and proposed procedure were                         verified prior to the procedure by the nurse. The                         procedure was verified in the procedure room.                        - ASA Grade Assessment: III - A patient with severe                         systemic disease.                        - After reviewing the risks and benefits, the patient                         was deemed in satisfactory condition to undergo the                         procedure.                        - ASA Grade Assessment: III -  A patient with severe                         systemic disease.                        - After reviewing the risks and benefits, the patient                         was deemed in satisfactory condition to undergo the                         procedure.                        After obtaining informed  consent, the colonoscope was                         passed under direct vision. Throughout the procedure,                         the patient's blood pressure, pulse, and oxygen                         saturations were monitored continuously. The                         Colonoscope was introduced through the anus and                         advanced to the the cecum, identified by appendiceal                         orifice and ileocecal valve. The colonoscopy was                         performed without difficulty. The patient tolerated                         the procedure well. The quality of the bowel                         preparation was good. The ileocecal valve, appendiceal                         orifice, and rectum were photographed. The ileocecal                         valve, appendiceal orifice, and rectum were                         photographed. Findings:      The perianal and digital rectal examinations were normal. Pertinent       negatives include normal sphincter tone and no palpable rectal lesions.      Non-bleeding internal hemorrhoids were found during retroflexion. The       hemorrhoids were Grade I (internal hemorrhoids that do not prolapse).      A few small-mouthed diverticula were found in the sigmoid colon.      A 7 mm polyp was found  in the sigmoid colon. The polyp was sessile. The       polyp was removed with a cold snare. Resection and retrieval were       complete. Verification of patient identification for the specimen was       done by the physician and nurse.      A 5 mm polyp was found in the recto-sigmoid colon. The polyp was       sessile. The polyp was removed with a jumbo cold forceps. Resection and       retrieval were complete.      The exam was otherwise without abnormality. Impression:            - Non-bleeding internal hemorrhoids.                        - Diverticulosis in the sigmoid colon.                        - One 7 mm polyp in the  sigmoid colon, removed with a                         cold snare. Resected and retrieved.                        - One 5 mm polyp at the recto-sigmoid colon, removed                         with a jumbo cold forceps. Resected and retrieved.                        - The examination was otherwise normal. Recommendation:        - Patient has a contact number available for                         emergencies. The signs and symptoms of potential                         delayed complications were discussed with the patient.                         Return to normal activities tomorrow. Written                         discharge instructions were provided to the patient.                        - Resume previous diet.                        - Continue present medications.                        - Await pathology results.                        - If polyps are benign or adenomatous without                         dysplasia, I will advise NO further colonoscopy due to  advanced age and/or severe comorbidity.                        - Return to GI office PRN.                        - The findings and recommendations were discussed with                         the patient. Procedure Code(s):     --- Professional ---                        [phone], Colonoscopy, flexible; with removal of                         tumor(s), polyp(s), or other lesion(s) by snare                         technique                        45380, 44, Colonoscopy, flexible; with biopsy, single                         or multiple Diagnosis Code(s):     --- Professional ---                        K57.30, Diverticulosis of large intestine without                         perforation or abscess without bleeding                        K63.5, Polyp of colon                        K64.0, First degree hemorrhoids                        Z86.010, Personal history of colonic polyps CPT copyright 2019 [employer]  [employer]. All rights reserved. The codes documented in this report are preliminary and upon coder review may  be revised to meet current compliance requirements. Efrain Sella MD, MD 01/24/2021 12:05:08 PM This report has been signed electronically. Number of Addenda: 0 Note Initiated On: 01/24/2021 11:32 AM Scope Withdrawal Time: 0 hours 6 minutes 52 seconds  Total Procedure Duration: 0 hours 16 minutes 28 seconds  Estimated Blood Loss:  Estimated blood loss: none.      [facility]

## 2021-01-25 ENCOUNTER — Encounter: Payer: Self-pay | Admitting: Internal Medicine

## 2021-01-25 LAB — SURGICAL PATHOLOGY

## 2021-01-31 ENCOUNTER — Ambulatory Visit: Payer: Medicare Other

## 2021-02-19 ENCOUNTER — Ambulatory Visit
Admission: RE | Admit: 2021-02-19 | Discharge: 2021-02-19 | Disposition: A | Discharge status: Home / Self Care | Payer: Medicare Other | Source: Ambulatory Visit | Attending: Specialist | Admitting: Specialist

## 2021-02-19 ENCOUNTER — Other Ambulatory Visit: Payer: Self-pay

## 2021-02-19 DIAGNOSIS — J449 Chronic obstructive pulmonary disease, unspecified: Secondary | ICD-10-CM | POA: Diagnosis not present

## 2021-02-26 ENCOUNTER — Inpatient Hospital Stay
Admission: EM | Admit: 2021-02-26 | Discharge: 2021-02-28 | DRG: 871 | Disposition: A | Discharge status: Home Health | Payer: Medicare Other | Attending: Internal Medicine | Admitting: Internal Medicine

## 2021-02-26 ENCOUNTER — Emergency Department: Payer: Medicare Other

## 2021-02-26 ENCOUNTER — Inpatient Hospital Stay: Payer: Medicare Other

## 2021-02-26 ENCOUNTER — Other Ambulatory Visit: Payer: Self-pay

## 2021-02-26 ENCOUNTER — Encounter: Payer: Self-pay | Admitting: Emergency Medicine

## 2021-02-26 DIAGNOSIS — Z85828 Personal history of other malignant neoplasm of skin: Secondary | ICD-10-CM

## 2021-02-26 DIAGNOSIS — F419 Anxiety disorder, unspecified: Secondary | ICD-10-CM | POA: Diagnosis present

## 2021-02-26 DIAGNOSIS — D7589 Other specified diseases of blood and blood-forming organs: Secondary | ICD-10-CM | POA: Diagnosis present

## 2021-02-26 DIAGNOSIS — I2510 Atherosclerotic heart disease of native coronary artery without angina pectoris: Secondary | ICD-10-CM | POA: Diagnosis present

## 2021-02-26 DIAGNOSIS — R0902 Hypoxemia: Secondary | ICD-10-CM | POA: Diagnosis present

## 2021-02-26 DIAGNOSIS — I10 Essential (primary) hypertension: Secondary | ICD-10-CM | POA: Diagnosis present

## 2021-02-26 DIAGNOSIS — I700 Atherosclerosis of aorta: Secondary | ICD-10-CM | POA: Diagnosis present

## 2021-02-26 DIAGNOSIS — A419 Sepsis, unspecified organism: Secondary | ICD-10-CM | POA: Diagnosis present

## 2021-02-26 DIAGNOSIS — E872 Acidosis, unspecified: Secondary | ICD-10-CM | POA: Diagnosis present

## 2021-02-26 DIAGNOSIS — K219 Gastro-esophageal reflux disease without esophagitis: Secondary | ICD-10-CM | POA: Diagnosis present

## 2021-02-26 DIAGNOSIS — I708 Atherosclerosis of other arteries: Secondary | ICD-10-CM | POA: Diagnosis present

## 2021-02-26 DIAGNOSIS — G4709 Other insomnia: Secondary | ICD-10-CM | POA: Diagnosis present

## 2021-02-26 DIAGNOSIS — Z87891 Personal history of nicotine dependence: Secondary | ICD-10-CM | POA: Diagnosis not present

## 2021-02-26 DIAGNOSIS — J9601 Acute respiratory failure with hypoxia: Secondary | ICD-10-CM | POA: Diagnosis present

## 2021-02-26 DIAGNOSIS — J189 Pneumonia, unspecified organism: Secondary | ICD-10-CM

## 2021-02-26 DIAGNOSIS — J159 Unspecified bacterial pneumonia: Secondary | ICD-10-CM | POA: Diagnosis present

## 2021-02-26 DIAGNOSIS — N400 Enlarged prostate without lower urinary tract symptoms: Secondary | ICD-10-CM | POA: Diagnosis present

## 2021-02-26 DIAGNOSIS — R531 Weakness: Secondary | ICD-10-CM | POA: Diagnosis present

## 2021-02-26 DIAGNOSIS — Z7982 Long term (current) use of aspirin: Secondary | ICD-10-CM | POA: Diagnosis not present

## 2021-02-26 DIAGNOSIS — R6520 Severe sepsis without septic shock: Secondary | ICD-10-CM | POA: Diagnosis present

## 2021-02-26 DIAGNOSIS — M1990 Unspecified osteoarthritis, unspecified site: Secondary | ICD-10-CM | POA: Diagnosis present

## 2021-02-26 DIAGNOSIS — G20 Parkinson's disease: Secondary | ICD-10-CM | POA: Diagnosis present

## 2021-02-26 DIAGNOSIS — Z9079 Acquired absence of other genital organ(s): Secondary | ICD-10-CM | POA: Diagnosis not present

## 2021-02-26 DIAGNOSIS — J441 Chronic obstructive pulmonary disease with (acute) exacerbation: Secondary | ICD-10-CM | POA: Diagnosis present

## 2021-02-26 DIAGNOSIS — Z20822 Contact with and (suspected) exposure to covid-19: Secondary | ICD-10-CM | POA: Diagnosis present

## 2021-02-26 DIAGNOSIS — Z79899 Other long term (current) drug therapy: Secondary | ICD-10-CM

## 2021-02-26 DIAGNOSIS — Z9049 Acquired absence of other specified parts of digestive tract: Secondary | ICD-10-CM

## 2021-02-26 LAB — URINALYSIS, COMPLETE (UACMP) WITH MICROSCOPIC
Bacteria, UA: NONE SEEN
Specific Gravity, Urine: 1.023 (ref 1.005–1.030)
Squamous Epithelial / HPF: NONE SEEN (ref 0–5)

## 2021-02-26 LAB — CBC WITH DIFFERENTIAL/PLATELET
Abs Immature Granulocytes: 0.05 10*3/uL (ref 0.00–0.07)
Basophils Absolute: 0 10*3/uL (ref 0.0–0.1)
Basophils Relative: 0 %
Eosinophils Absolute: 0.1 10*3/uL (ref 0.0–0.5)
Eosinophils Relative: 1 %
HCT: 43.5 % (ref 39.0–52.0)
Hemoglobin: 15.1 g/dL (ref 13.0–17.0)
Immature Granulocytes: 0 %
Lymphocytes Relative: 3 %
Lymphs Abs: 0.5 10*3/uL — ABNORMAL LOW (ref 0.7–4.0)
MCH: 34.8 pg — ABNORMAL HIGH (ref 26.0–34.0)
MCHC: 34.7 g/dL (ref 30.0–36.0)
MCV: 100.2 fL — ABNORMAL HIGH (ref 80.0–100.0)
Monocytes Absolute: 0.5 10*3/uL (ref 0.1–1.0)
Monocytes Relative: 3 %
Neutro Abs: 13.8 10*3/uL — ABNORMAL HIGH (ref 1.7–7.7)
Neutrophils Relative %: 93 %
Platelets: 183 10*3/uL (ref 150–400)
RBC: 4.34 MIL/uL (ref 4.22–5.81)
RDW: 12.9 % (ref 11.5–15.5)
WBC: 15 10*3/uL — ABNORMAL HIGH (ref 4.0–10.5)
nRBC: 0 % (ref 0.0–0.2)

## 2021-02-26 LAB — PROTIME-INR
INR: 1 (ref 0.8–1.2)
Prothrombin Time: 13.6 seconds (ref 11.4–15.2)

## 2021-02-26 LAB — COMPREHENSIVE METABOLIC PANEL
ALT: 39 U/L (ref 0–44)
AST: 91 U/L — ABNORMAL HIGH (ref 15–41)
Albumin: 3.9 g/dL (ref 3.5–5.0)
Alkaline Phosphatase: 77 U/L (ref 38–126)
Anion gap: 12 (ref 5–15)
BUN: 24 mg/dL — ABNORMAL HIGH (ref 8–23)
CO2: 23 mmol/L (ref 22–32)
Calcium: 9 mg/dL (ref 8.9–10.3)
Chloride: 102 mmol/L (ref 98–111)
Creatinine, Ser: 0.75 mg/dL (ref 0.61–1.24)
GFR, Estimated: >60 mL/min (ref >60)
Glucose, Bld: 113 mg/dL — ABNORMAL HIGH (ref 70–99)
Potassium: 3.8 mmol/L (ref 3.5–5.1)
Sodium: 137 mmol/L (ref 135–145)
Total Bilirubin: 1 mg/dL (ref 0.3–1.2)
Total Protein: 7.1 g/dL (ref 6.5–8.1)

## 2021-02-26 LAB — RESP PANEL BY RT-PCR (FLU A&B, COVID) ARPGX2
Influenza A by PCR: NEGATIVE
Influenza B by PCR: NEGATIVE
SARS Coronavirus 2 by RT PCR: NEGATIVE

## 2021-02-26 LAB — LACTIC ACID, PLASMA
Lactic Acid, Venous: 2.1 mmol/L (ref 0.5–1.9)
Lactic Acid, Venous: 1.9 mmol/L (ref 0.5–1.9)
Lactic Acid, Venous: 2 mmol/L (ref 0.5–1.9)

## 2021-02-26 LAB — D-DIMER, QUANTITATIVE: D-Dimer, Quant: 0.73 ug/mL-FEU — ABNORMAL HIGH (ref 0.00–0.50)

## 2021-02-26 LAB — PROCALCITONIN: Procalcitonin: 0.16 ng/mL

## 2021-02-26 LAB — APTT: aPTT: 34 seconds (ref 24–36)

## 2021-02-26 LAB — TROPONIN I (HIGH SENSITIVITY)
Troponin I (High Sensitivity): 8 ng/L (ref <18)
Troponin I (High Sensitivity): 9 ng/L (ref <18)
Troponin I (High Sensitivity): 7 ng/L (ref <18)

## 2021-02-26 MED ORDER — FLUTICASONE-UMECLIDIN-VILANT 100-62.5-25 MCG/INH IN AEPB: 1.0000 | INHALATION_SPRAY | Freq: Every day | RESPIRATORY_TRACT | Status: DC

## 2021-02-26 MED ORDER — FERROUS GLUCONATE 324 (38 FE) MG PO TABS
324.0000 mg | ORAL_TABLET | Freq: Every day | ORAL | Status: DC
  Administered 2021-02-27 – 2021-02-28 (×2): 324 mg via ORAL
  Filled 2021-02-26 (×2): qty 1

## 2021-02-26 MED ORDER — LACTATED RINGERS IV BOLUS (SEPSIS)
1000.0000 mL | Freq: Once | INTRAVENOUS | Status: AC
  Administered 2021-02-26: 1000 mL via INTRAVENOUS

## 2021-02-26 MED ORDER — ONDANSETRON HCL 4 MG/2ML IJ SOLN: 4.0000 mg | Freq: Four times a day (QID) | INTRAMUSCULAR | Status: DC | PRN

## 2021-02-26 MED ORDER — QUETIAPINE FUMARATE 25 MG PO TABS
50.0000 mg | ORAL_TABLET | Freq: Every day | ORAL | Status: DC
  Administered 2021-02-26 – 2021-02-27 (×2): 50 mg via ORAL
  Filled 2021-02-26 (×2): qty 2

## 2021-02-26 MED ORDER — DOCUSATE SODIUM 100 MG PO CAPS
100.0000 mg | ORAL_CAPSULE | Freq: Every day | ORAL | Status: DC
  Administered 2021-02-27 – 2021-02-28 (×2): 100 mg via ORAL
  Filled 2021-02-26 (×2): qty 1

## 2021-02-26 MED ORDER — SODIUM CHLORIDE 0.9 % IV SOLN: INTRAVENOUS | Status: DC

## 2021-02-26 MED ORDER — ENOXAPARIN SODIUM 40 MG/0.4ML IJ SOSY
40.0000 mg | PREFILLED_SYRINGE | INTRAMUSCULAR | Status: DC
  Administered 2021-02-26 – 2021-02-27 (×2): 40 mg via SUBCUTANEOUS
  Filled 2021-02-26 (×2): qty 0.4

## 2021-02-26 MED ORDER — LACTATED RINGERS IV BOLUS (SEPSIS)
500.0000 mL | Freq: Once | INTRAVENOUS | Status: AC
  Administered 2021-02-26: 500 mL via INTRAVENOUS

## 2021-02-26 MED ORDER — ALPRAZOLAM 0.5 MG PO TABS
0.5000 mg | ORAL_TABLET | Freq: Every day | ORAL | Status: DC
  Administered 2021-02-26 – 2021-02-27 (×2): 0.5 mg via ORAL
  Filled 2021-02-26 (×2): qty 1

## 2021-02-26 MED ORDER — IOHEXOL 350 MG/ML SOLN
75.0000 mL | Freq: Once | INTRAVENOUS | Status: AC | PRN
  Administered 2021-02-26: 75 mL via INTRAVENOUS

## 2021-02-26 MED ORDER — UMECLIDINIUM BROMIDE 62.5 MCG/INH IN AEPB
1.0000 | INHALATION_SPRAY | Freq: Every day | RESPIRATORY_TRACT | Status: DC
  Administered 2021-02-27 – 2021-02-28 (×2): 1 via RESPIRATORY_TRACT
  Filled 2021-02-26: qty 7

## 2021-02-26 MED ORDER — IPRATROPIUM-ALBUTEROL 0.5-2.5 (3) MG/3ML IN SOLN
3.0000 mL | Freq: Four times a day (QID) | RESPIRATORY_TRACT | Status: DC
  Administered 2021-02-27 (×2): 3 mL via RESPIRATORY_TRACT
  Filled 2021-02-26 (×2): qty 3

## 2021-02-26 MED ORDER — ASPIRIN EC 81 MG PO TBEC
81.0000 mg | DELAYED_RELEASE_TABLET | Freq: Every day | ORAL | Status: DC
  Administered 2021-02-27 – 2021-02-28 (×2): 81 mg via ORAL
  Filled 2021-02-26 (×2): qty 1

## 2021-02-26 MED ORDER — ALBUTEROL SULFATE (2.5 MG/3ML) 0.083% IN NEBU
2.5000 mg | INHALATION_SOLUTION | Freq: Every day | RESPIRATORY_TRACT | Status: DC
  Administered 2021-02-26: 2.5 mg via RESPIRATORY_TRACT
  Filled 2021-02-26: qty 3

## 2021-02-26 MED ORDER — METHYLPREDNISOLONE SODIUM SUCC 125 MG IJ SOLR
125.0000 mg | Freq: Once | INTRAMUSCULAR | Status: AC
  Administered 2021-02-26: 125 mg via INTRAVENOUS
  Filled 2021-02-26: qty 2

## 2021-02-26 MED ORDER — SODIUM CHLORIDE 0.9 % IV SOLN
500.0000 mg | INTRAVENOUS | Status: DC
  Administered 2021-02-26: 500 mg via INTRAVENOUS
  Filled 2021-02-26 (×2): qty 500

## 2021-02-26 MED ORDER — PANTOPRAZOLE SODIUM 40 MG PO TBEC
40.0000 mg | DELAYED_RELEASE_TABLET | Freq: Every day | ORAL | Status: DC
  Filled 2021-02-26: qty 1

## 2021-02-26 MED ORDER — ALBUTEROL SULFATE (2.5 MG/3ML) 0.083% IN NEBU: 2.5000 mg | INHALATION_SOLUTION | RESPIRATORY_TRACT | Status: DC | PRN

## 2021-02-26 MED ORDER — AMLODIPINE BESYLATE 5 MG PO TABS
2.5000 mg | ORAL_TABLET | Freq: Every day | ORAL | Status: DC
  Filled 2021-02-26: qty 1

## 2021-02-26 MED ORDER — IPRATROPIUM-ALBUTEROL 0.5-2.5 (3) MG/3ML IN SOLN
3.0000 mL | Freq: Once | RESPIRATORY_TRACT | Status: AC
  Administered 2021-02-26: 3 mL via RESPIRATORY_TRACT
  Filled 2021-02-26: qty 3

## 2021-02-26 MED ORDER — DOXAZOSIN MESYLATE 4 MG PO TABS
4.0000 mg | ORAL_TABLET | Freq: Every day | ORAL | Status: DC
  Administered 2021-02-28: 4 mg via ORAL
  Filled 2021-02-26 (×2): qty 1

## 2021-02-26 MED ORDER — CEFTRIAXONE SODIUM 2 G IJ SOLR
2.0000 g | INTRAMUSCULAR | Status: DC
  Administered 2021-02-26: 2 g via INTRAVENOUS
  Filled 2021-02-26 (×2): qty 20

## 2021-02-26 MED ORDER — FLUTICASONE FUROATE-VILANTEROL 100-25 MCG/INH IN AEPB
1.0000 | INHALATION_SPRAY | Freq: Every day | RESPIRATORY_TRACT | Status: DC
  Administered 2021-02-27 – 2021-02-28 (×2): 1 via RESPIRATORY_TRACT
  Filled 2021-02-26: qty 28

## 2021-02-26 MED ORDER — VITAMIN B-12 1000 MCG PO TABS
2000.0000 ug | ORAL_TABLET | Freq: Every day | ORAL | Status: DC
  Administered 2021-02-27 – 2021-02-28 (×2): 2000 ug via ORAL
  Filled 2021-02-26 (×2): qty 2

## 2021-02-26 MED ORDER — CARBIDOPA-LEVODOPA ER 50-200 MG PO TBCR
1.0000 | EXTENDED_RELEASE_TABLET | Freq: Three times a day (TID) | ORAL | Status: DC
  Administered 2021-02-26 – 2021-02-28 (×6): 1 via ORAL
  Filled 2021-02-26 (×9): qty 1

## 2021-02-26 MED ORDER — ACETAMINOPHEN 650 MG RE SUPP: 650.0000 mg | Freq: Four times a day (QID) | RECTAL | Status: DC | PRN

## 2021-02-26 MED ORDER — METOPROLOL TARTRATE 5 MG/5ML IV SOLN: 5.0000 mg | Freq: Four times a day (QID) | INTRAVENOUS | Status: DC | PRN

## 2021-02-26 MED ORDER — ASPIRIN 81 MG PO CHEW: 324.0000 mg | CHEWABLE_TABLET | Freq: Once | ORAL | Status: DC

## 2021-02-26 MED ORDER — ACETAMINOPHEN 325 MG PO TABS
650.0000 mg | ORAL_TABLET | Freq: Four times a day (QID) | ORAL | Status: DC | PRN
  Administered 2021-02-28: 650 mg via ORAL
  Filled 2021-02-26: qty 2

## 2021-02-26 MED ORDER — LACTATED RINGERS IV SOLN: INTRAVENOUS | Status: DC

## 2021-02-26 MED ORDER — SODIUM CHLORIDE 0.9 % IV BOLUS
1000.0000 mL | Freq: Once | INTRAVENOUS | Status: AC
  Administered 2021-02-26: 22:00:00 1000 mL via INTRAVENOUS

## 2021-02-26 MED ORDER — POTASSIUM CHLORIDE CRYS ER 20 MEQ PO TBCR
30.0000 meq | EXTENDED_RELEASE_TABLET | Freq: Every day | ORAL | Status: DC
  Administered 2021-02-27 – 2021-02-28 (×2): 30 meq via ORAL
  Filled 2021-02-26 (×2): qty 1

## 2021-02-26 MED ORDER — ONDANSETRON HCL 4 MG PO TABS: 4.0000 mg | ORAL_TABLET | Freq: Four times a day (QID) | ORAL | Status: DC | PRN

## 2021-02-26 MED ORDER — PREDNISONE 20 MG PO TABS: 40.0000 mg | ORAL_TABLET | Freq: Every day | ORAL | Status: DC

## 2021-02-26 MED ORDER — IPRATROPIUM-ALBUTEROL 0.5-2.5 (3) MG/3ML IN SOLN
3.0000 mL | Freq: Four times a day (QID) | RESPIRATORY_TRACT | Status: DC
  Administered 2021-02-26: 3 mL via RESPIRATORY_TRACT
  Filled 2021-02-26: qty 3

## 2021-02-26 MED ORDER — ALBUTEROL SULFATE (2.5 MG/3ML) 0.083% IN NEBU: 5.0000 mg | INHALATION_SOLUTION | Freq: Every day | RESPIRATORY_TRACT | Status: DC

## 2021-02-26 MED ORDER — METHYLPREDNISOLONE SODIUM SUCC 125 MG IJ SOLR
60.0000 mg | INTRAMUSCULAR | Status: AC
  Administered 2021-02-27: 60 mg via INTRAVENOUS
  Filled 2021-02-26: qty 2

## 2021-02-26 MED ORDER — SUMATRIPTAN SUCCINATE 50 MG PO TABS
50.0000 mg | ORAL_TABLET | ORAL | Status: DC | PRN
  Administered 2021-02-26: 50 mg via ORAL
  Filled 2021-02-26 (×3): qty 1

## 2021-02-26 MED ORDER — MORPHINE SULFATE (PF) 2 MG/ML IV SOLN: 2.0000 mg | INTRAVENOUS | Status: DC | PRN

## 2021-02-26 MED ORDER — SENNOSIDES-DOCUSATE SODIUM 8.6-50 MG PO TABS: 1.0000 | ORAL_TABLET | Freq: Every evening | ORAL | Status: DC | PRN

## 2021-02-26 MED ORDER — HYDROCODONE-ACETAMINOPHEN 5-325 MG PO TABS: 1.0000 | ORAL_TABLET | ORAL | Status: DC | PRN

## 2021-02-26 MED ORDER — BISACODYL 5 MG PO TBEC: 5.0000 mg | DELAYED_RELEASE_TABLET | Freq: Every day | ORAL | Status: DC | PRN

## 2021-02-26 NOTE — H&P (Signed)
History and Physical    Patrick Montoya AOZ:[id number] DOB: 21-Dec-1940 DOA: 02/26/2021  PCP: [facility]  Patient coming from: Home  I have personally briefly reviewed patient's old medical records in [facility]  Chief Complaint: Shortness of breath  HPI: Patrick Montoya is a 80 y.o. male with medical history significant for COPD, pulmonary nodules, Parkinson's, migraines, hypertension, arthritis, who presents to the emergency department on 02/26/2021 with shortness of breath and right-sided chest and abdominal pain. Onset of patient's symptoms was 1 day before admission and duration is intermittent. He also developed chest pain when the shortness of breath started; he was sitting watching TV at the time. Pain is located in the lower lateral right chest wall, does not radiate, is up to 9/10 at times and is characterized as a dull pain. Symptoms are alleviated by nothing and exacerbated by taking a deep breath and any exertion. He noticed his heart rate was fast. Associated symptoms: Cough productive of thick, white/clear sputum and wheezing. Had fever to 100.5 on day of admission and also had chills. He notes right lower quadrant and right lateral abdominal pain that also began the day before admission, has been intermittent, is characterized as dull/achy, is alleviated by nothing and exacerbated by nothing.  He denies any nausea, vomiting, diarrhea, bloody stool, constipation, dysuria or hematuria but does note that he has continued bladder spasms. He also reports some left nipple pain that began on the day of admission. Treatments: He tried using his inhalers but it did not help. He has oxygen at home but does not use it. His O2 sat at home by his home pulse ox was 80%.  He had a recent urinary tract infection the week before admission and completed 5 days of Bactrim to treat it. He took Azo for his bladder spasms prior to arrival in the emergency department. He has no history of any CAD  or CHF. Patient reports within the last 2 weeks he received his COVID booster, flu shot, and pneumonia shot.   ED Course: Patient was found to be hypoxic with oxygen saturation 85-88% on room air.  He met SIRS criteria with pulse 109, WBCs 15, and respiratory rate 30.  Labs include initial lactic acid 2.1, Procalcitonin 0.16, WBCs 15.  SARS-CoV-2 PCR, influenza A and B are all negative.  Chest x-ray showed diffuse interstitial opacities with airspace disease in the right middle and lower lung that could reflect asymmetric pulmonary edema or multifocal pneumonia.  Blood cultures were ordered.  Patient was placed on oxygen. He was given IV ceftriaxone and IV azithromycin for pneumonia.  Review of Systems: As per HPI otherwise all other systems reviewed and are unremarable. NEUROLOGICAL: No headache or focal weakness.    Past Medical History:  Diagnosis Date   Basal cell carcinoma 07/25/2015   L zygoma inf to lat canthus   Basal cell carcinoma 06/11/2018   L nasal ala   BPH (benign prostatic hyperplasia)    COPD (chronic obstructive pulmonary disease) (HCC)    Elevated liver enzymes    Emphysema of lung (HCC)    Gastric ulcer    GERD (gastroesophageal reflux disease)    Hepatitis B    Hypertension    Parkinson's disease (HCC)    Prostate hypertrophy    Rotator cuff tear right   UTI (urinary tract infection)     Past Surgical History:  Procedure Laterality Date   CHOLECYSTECTOMY     COLONOSCOPY WITH PROPOFOL N/A 10/20/2015  Procedure: COLONOSCOPY WITH PROPOFOL;  Surgeon: Manya Silvas, MD;  Location: [facility] ENDOSCOPY;  Service: Endoscopy;  Laterality: N/A;   COLONOSCOPY WITH PROPOFOL N/A 01/24/2021   Procedure: COLONOSCOPY WITH PROPOFOL;  Surgeon: Toledo, Benay Pike, MD;  Location: [facility] ENDOSCOPY;  Service: Gastroenterology;  Laterality: N/A;   ESOPHAGOGASTRODUODENOSCOPY (EGD) WITH PROPOFOL N/A 10/20/2015   Procedure: ESOPHAGOGASTRODUODENOSCOPY (EGD) WITH PROPOFOL;  Surgeon: Manya Silvas, MD;  Location: [facility] ENDOSCOPY;  Service: Endoscopy;  Laterality: N/A;   GREEN LIGHT LASER TURP (TRANSURETHRAL RESECTION OF PROSTATE N/A 11/17/2018   Procedure: GREEN LIGHT LASER TURP (TRANSURETHRAL RESECTION OF PROSTATE;  Surgeon: Royston Cowper, MD;  Location: [facility] ORS;  Service: Urology;  Laterality: N/A;   INGUINAL HERNIA REPAIR Right 02/18/2019   Procedure: HERNIA REPAIR INGUINAL ADULT;  Surgeon: Royston Cowper, MD;  Location: [facility] ORS;  Service: Urology;  Laterality: Right;   INSERTION OF MESH Right 02/18/2019   Procedure: INSERTION OF MESH;  Surgeon: Royston Cowper, MD;  Location: [facility] ORS;  Service: Urology;  Laterality: Right;   TONSILLECTOMY     TRANSURETHRAL MICROWAVE THERAPY      Social History  reports that he has quit smoking. His smoking use included cigarettes. He has a 62.00 pack-year smoking history. He has never used smokeless tobacco. He reports current alcohol use. He reports that he does not use drugs.  No Known Allergies  Family History  Problem Relation Age of Onset   Colon cancer Mother    Hepatitis Son    Prostate cancer Neg Hx    Kidney cancer Neg Hx      Home Medications  Prior to Admission medications   Medication Sig Start Date End Date Taking? Authorizing Provider  albuterol (PROVENTIL HFA;VENTOLIN HFA) 108 (90 Base) MCG/ACT inhaler Inhale 2 puffs into the lungs every 4 (four) hours as needed for wheezing or shortness of breath.    [provider]  albuterol (PROVENTIL) (2.5 MG/3ML) 0.083% nebulizer solution Take 5 mg by nebulization at bedtime.    [provider]  ALPRAZolam Duanne Moron) 0.5 MG tablet Take 0.5 mg by mouth at bedtime.    [provider]  amLODipine (NORVASC) 2.5 MG tablet Take 2.5 mg by mouth daily. 08/02/18   [provider]  aspirin 81 MG tablet Take 81 mg by mouth daily.      [provider]  carbidopa-levodopa (SINEMET CR) 50-200 MG tablet Take 1 tablet by mouth 3 (three) times  daily.     [provider]  cephALEXin (KEFLEX) 500 MG capsule Take 1 capsule (500 mg total) by mouth 4 (four) times daily. 02/18/19   Royston Cowper, MD  ciprofloxacin (CIPRO) 500 MG tablet Take 1 tablet (500 mg total) by mouth 2 (two) times daily. Patient not taking: Reported on 02/05/2019 11/17/18   Royston Cowper, MD  Cyanocobalamin ([employer] VITAMIN B-12) 2000 MCG TBCR Take 2,000 mcg by mouth daily.    [provider]  docusate sodium (COLACE) 100 MG capsule Take 100 mg by mouth daily.    [provider]  doxazosin (CARDURA) 4 MG tablet Take 4 mg by mouth daily.     [provider]  KLOR-CON M10 10 MEQ tablet Take 30 mEq by mouth daily.  10/31/15   [provider]  Meth-Hyo-M Bl-Na Phos-Ph Sal (URIBEL) 118 MG CAPS Take 1 capsule (118 mg total) by mouth every 6 (six) hours as needed. 11/17/18   Royston Cowper, MD  NUCYNTA 50 MG tablet Take 1 tablet (  50 mg total) by mouth every 6 (six) hours as needed. 1 TO 2 TABS Q 6 HOURS PRN PAIN 02/18/19   Royston Cowper, MD  omeprazole (PRILOSEC) 40 MG capsule Take 40 mg by mouth daily.  11/06/15   [provider]  QUEtiapine (SEROQUEL) 50 MG tablet Take 50 mg by mouth at bedtime.    [provider]  rizatriptan (MAXALT) 10 MG tablet Take 10 mg by mouth as needed for migraine. May repeat in 2 hours if needed    [provider]  SUMAtriptan (IMITREX) 50 MG tablet Take 50 mg by mouth every 2 (two) hours as needed for migraine (max 2 tablets/24hrs). May repeat in 2 hours if headache persists or recurs.    [provider]  tiotropium (SPIRIVA) 18 MCG inhalation capsule Take 1 capsule by mouth daily.  05/31/16   [provider]    Physical Exam: Vitals:   02/26/21 1118 02/26/21 1120 02/26/21 1130 02/26/21 1200  BP: 113/71  92/76 123/74  Pulse: (!) 107  97 97  Resp: 19  (!) 29 (!) 30  Temp: 99.4 F (37.4 C)     TempSrc: Oral     SpO2: 90%  92% 92%  Weight:  77.1 kg     Height:  _0  (1.702 m)      Constitutional: NAD, calm, comfortable, ill-appearing. Vitals:   02/26/21 1118 02/26/21 1120 02/26/21 1130 02/26/21 1200  BP: 113/71  92/76 123/74  Pulse: (!) 107  97 97  Resp: 19  (!) 29 (!) 30  Temp: 99.4 F (37.4 C)     TempSrc: Oral     SpO2: 90%  92% 92%  Weight:  77.1 kg    Height:  _1  (1.702 m)     Eyes: Pupils equal and round, lids and conjunctivae without icterus or erythema. ENMT: Mucous membranes are very dry. Posterior pharynx clear of any exudate or lesions. Nares patent without discharge or bleeding. Nares and nose somewhat misshapen with appearance of previous surgery.  Normocephalic, atraumatic.   Neck: normal, supple, no masses, trachea midline.  Thyroid nontender, no masses appreciated, no thyromegaly. Respiratory: clear to auscultation bilaterally. Chest wall movements are symmetric. No crackles or rhonchi.  No accessory muscle use.  Decreased breath sounds in the right mid to lower lung.  Minimal scattered wheezing.  Oxygen by nasal cannula in place.  Tachypnea noted.  Mild tenderness to palpation of the right lower and right lower lateral chest wall.  Mild tenderness to palpation of the left nipple; left nipple somewhat erythematous and more protuberant than the right. Cardiovascular: Regular rate and rhythm, no murmurs / rubs / gallops. Pulses: radial, PT, and DP pulses 2+ bilaterally. No carotid bruits.  Capillary refill all 4 extremities less than 3 seconds. Edema: None bilaterally. GI: soft, non-distended, normal active bowel sounds. No hepatosplenomegaly. No rigidity, rebound, or guarding. No CVA tenderness. No masses palpated. Mild tenderness in the right lower quadrant. Musculoskeletal: no clubbing / cyanosis. No joint deformity upper and lower extremities. Good ROM, no contractures. Normal muscle tone.  No deformity in the back bilaterally. Mild lower back tenderness bilaterally. Integument: no rashes, lesions, ulcers. No  induration. Clean, dry, intact. Neurologic: CN 2-12 grossly intact. Sensation grossly intact to light touch. DTR 2+ bilaterally.  Babinski: Toes downgoing bilaterally.  Strength 5/5 in all 4.  Intact rapid alternating movements bilaterally.  No pronator drift.  Slight tremor. Psychiatric: Normal judgment and insight. Alert and oriented x 3. Normal mood.  Normal and appropriate affect. Lymphatic: No cervical lymphadenopathy. No supraclavicular lymphadenopathy.   Labs on Admission: I have personally reviewed the following labs and imaging studies.  CBC: Recent Labs  Lab 02/26/21 1130  WBC 15.0*  NEUTROABS 13.8*  HGB 15.1  HCT 43.5  MCV 100.2*  PLT 292    Basic Metabolic Panel: Recent Labs  Lab 02/26/21 1130  NA 137  K 3.8  CL 102  CO2 23  GLUCOSE 113*  BUN 24*  CREATININE 0.75  CALCIUM 9.0    GFR: Estimated Creatinine Clearance: 67.7 mL/min (by C-G formula based on SCr of 0.75 mg/dL).  Liver Function Tests: Recent Labs  Lab 02/26/21 1130  AST 91*  ALT 39  ALKPHOS 77  BILITOT 1.0  PROT 7.1  ALBUMIN 3.9    Urine analysis:    Component Value Date/Time   COLORURINE RED (A) 02/26/2021 1130   APPEARANCEUR CLEAR (A) 02/26/2021 1130   APPEARANCEUR Clear 10/26/2012 1721   LABSPEC 1.023 02/26/2021 1130   LABSPEC 1.021 10/26/2012 1721   PHURINE  02/26/2021 1130    TEST NOT REPORTED DUE TO COLOR INTERFERENCE OF URINE PIGMENT   GLUCOSEU (A) 02/26/2021 1130    TEST NOT REPORTED DUE TO COLOR INTERFERENCE OF URINE PIGMENT   GLUCOSEU Negative 10/26/2012 1721   HGBUR (A) 02/26/2021 1130    TEST NOT REPORTED DUE TO COLOR INTERFERENCE OF URINE PIGMENT   HGBUR negative 01/01/2008 0000   BILIRUBINUR (A) 02/26/2021 1130    TEST NOT REPORTED DUE TO COLOR INTERFERENCE OF URINE PIGMENT   BILIRUBINUR Negative 10/26/2012 1721   KETONESUR (A) 02/26/2021 1130    TEST NOT REPORTED DUE TO COLOR INTERFERENCE OF URINE PIGMENT   PROTEINUR (A) 02/26/2021 1130    TEST NOT REPORTED DUE  TO COLOR INTERFERENCE OF URINE PIGMENT   UROBILINOGEN 1.0 08/29/2010 0041   NITRITE (A) 02/26/2021 1130    TEST NOT REPORTED DUE TO COLOR INTERFERENCE OF URINE PIGMENT   LEUKOCYTESUR (A) 02/26/2021 1130    TEST NOT REPORTED DUE TO COLOR INTERFERENCE OF URINE PIGMENT   LEUKOCYTESUR 3+ 10/26/2012 1721    Radiological Exams on Admission:  Viewed personally:  CT Angio Chest Pulmonary Embolism (PE) W or WO Contrast  Result Date: 02/26/2021 CLINICAL DATA:  Respiratory distress, hypoxia and fever. EXAM: CT ANGIOGRAPHY CHEST WITH CONTRAST TECHNIQUE: Multidetector CT imaging of the chest was performed using the standard protocol during bolus administration of intravenous contrast. Multiplanar CT image reconstructions and MIPs were obtained to evaluate the vascular anatomy. CONTRAST:  30m OMNIPAQUE IOHEXOL 350 MG/ML SOLN COMPARISON:  None. FINDINGS: Cardiovascular: The pulmonary arteries are adequately opacified. There is no evidence of pulmonary embolism. Atherosclerosis of the thoracic aorta without evidence of aneurysmal disease. Visualized proximal great vessels are normally patent. The heart size is normal. No pericardial fluid identified. Calcified coronary artery plaque present. Mediastinum/Nodes: No enlarged mediastinal, hilar, or axillary lymph nodes. Thyroid gland, trachea, and esophagus demonstrate no significant findings. Lungs/Pleura: Dense pneumonia seen throughout much of the right lower lobe. There also is airspace disease extending into the posterior right upper lobe and anterior right middle lobe. Stable 4 mm fissural nodule at the level of the minor fissure. No pleural fluid or pneumothorax. Stable advanced emphysematous lung disease. Upper Abdomen: No acute abnormality. Musculoskeletal: No chest wall abnormality. No acute or significant osseous findings. Review of the MIP images confirms the above findings. IMPRESSION: 1. Acute pneumonia primarily throughout the right lower lobe but also  extending into the posterior right upper  lobe and right middle lobe. 2. No evidence of pulmonary embolism. 3. Stable advanced emphysema. 4. Coronary atherosclerosis. Electronically Signed   By: Aletta Edouard M.D.   On: 02/26/2021 15:01   CT ABDOMEN PELVIS W CONTRAST  Result Date: 02/26/2021 CLINICAL DATA:  Respiratory distress, hypoxia and fever. Recent urinary tract infection. EXAM: CT ABDOMEN AND PELVIS WITH CONTRAST TECHNIQUE: Multidetector CT imaging of the abdomen and pelvis was performed using the standard protocol following bolus administration of intravenous contrast. CONTRAST:  80m OMNIPAQUE IOHEXOL 350 MG/ML SOLN COMPARISON:  Prior CT of the abdomen on 08/29/2010 FINDINGS: Lower chest: Right lower lobe pneumonia. See previously dictated chest CTA report. Hepatobiliary: No focal liver abnormality is seen. Status post cholecystectomy. No biliary dilatation. Pancreas: Unremarkable. No pancreatic ductal dilatation or surrounding inflammatory changes. Spleen: Normal in size without focal abnormality. Adrenals/Urinary Tract: Adrenal glands are unremarkable. Kidneys are normal, without renal calculi, focal lesion, or hydronephrosis. Bladder is mildly distended with suggestion of mild circumferential wall thickening and irregularity. This may reflect chronic outlet obstruction or component of underlying cystitis. There is a TURP defect at the bladder base. Stomach/Bowel: Bowel shows no evidence of obstruction, ileus, inflammation or lesion. No free intraperitoneal air identified. Vascular/Lymphatic: Atherosclerosis of the abdominal aorta and iliac arteries without evidence of aneurysmal disease. No lymphadenopathy identified. 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 Central prostate calcifications. Other: No abdominal wall hernia or abnormality. No abdominopelvic ascites. Musculoskeletal: No acute or significant osseous findings. IMPRESSION: 1. No acute findings in the abdomen or pelvis. 2. Mild  bladder wall irregularity and thickening which may be reflective of chronic outlet obstruction or component of underlying cystitis. 3. 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 Electronically Signed   By: GAletta EdouardM.D.   On: 02/26/2021 15:14   DG Chest Port 1 View  Result Date: 02/26/2021 CLINICAL DATA:  copd, sepsis, eval infiltrate, ptx EXAM: PORTABLE CHEST 1 VIEW COMPARISON:  Radiograph 12/14/2020, chest CT 02/19/2021 FINDINGS: Unchanged cardiomediastinal silhouette. There are diffuse interstitial opacities. There are airspace opacities in the right mid lung and right lower lung. There is no large pleural effusion or visible pneumothorax. Hyperinflated lungs with changes of emphysema. There is no acute osseous abnormality. Thoracic spondylosis. IMPRESSION: Diffuse interstitial opacities with airspace disease in the right mid to lower lung. Findings could reflect asymmetric pulmonary edema and/or multifocal pneumonia. Electronically Signed   By: JMaurine SimmeringM.D.   On: 02/26/2021 12:21    EKG: Independently reviewed. 91 bpm. Normal sinus rhythm. Flat T wave in aVL.     Assessment/Plan  Principal Problem:   Acute respiratory failure with hypoxia (HCC) Likely due primarily to bacterial pneumonia with lesser component of COPD exacerbation.  Tachypnea and O2 sat 85%-88% on room air. Plan: Continuous oxygen support with nasal cannula and increase to Ventimask or BiPAP as needed.  Placed in stepdown due to suspected need for increased oxygen support.    Active Problems:    Sepsis due to undetermined organism ([facility] Source: pneumonia.  Criteria: WBCs 15, pulse 109, respiratory rate 30. Plan:  Sepsis order set. Cultures ordered. IV antibiotics. IV fluids to provide volume: initial IVF 30 mL/kg x 1 given in the emergency department.  Additional IV fluids ordered. Monitor for signs of volume depletion; monitor blood pressure carefully.  Recheck lactic acid.     Bacterial pneumonia Pneumonia.  Severe. Requiring continuous oxygen support.  Type: Community acquired pneumonia.   Criteria for diagnosis: Chest x-ray suggestive of pneumonia, rhonchi on physical exam.  Likely  bacterial.  SARS-CoV-2 PCR test is negative. Plan: Sputum culture has been ordered.  Treat with IV ceftriaxone and IV azithromycin.  Monitor oxygen saturation levels.      COPD with acute exacerbation (HCC) COPD with small component of exacerbation.  Plan: Nebs of Duoneb q 6 hours scheduled and albuterol q 2 hours prn.  IV methylprednisolone scheduled x1 then oral prednisone. Continuous oxygen support.  Recommend taper of oral prednisone.     Primary hypertension Plan: Continue home medications with hold parameters of blood pressure is too low.     Parkinson's disease Plan: Continue home carbidopa-levodopa.     Right lower quadrant abdominal pain Plan: Obtain CT of the abdomen/pelvis. Ordered UA and urine culture but patient had Azo so is interfering with the tests. Consider repeat UA in AM.     Chest pain Likely chest wall pain from pneumonia but could have other causes. Plan: Telemetry. PRN pain medication. Check troponin. Aspirin. EKG. Telemetry.     Left nipple pain With mild tenderness. Plan: Outpatient follow up.    ____   DVT prophylaxis: Lovenox.  Code Status:   Full Code  Disposition Plan:   Patient is from:  Home  Anticipated DC to:  Home  Anticipated DC date:  03/06/2021  Anticipated DC barriers: Oxygen requirement, sepsis  Consults called:  None  Admission status:  Inpatient   Severity of Illness: The appropriate patient status for this patient is INPATIENT. Inpatient status is judged to be reasonable and necessary in order to provide the required intensity of service to ensure the patient's safety. The patient's presenting symptoms, physical exam findings, and initial radiographic and laboratory data in the context of their chronic comorbidities is felt to place them at  high risk for further clinical deterioration. Furthermore, it is not anticipated that the patient will be medically stable for discharge from the hospital within 2 midnights of admission.  Findings include pneumonia on chest x-ray, hypoxia with O2 sat 85-88%, tachypnea, elevated WBCs, tachycardia.  Concerns include sepsis, new need for continuous oxygen support, need for IV fluids, IV antibiotics, and risks  * I certify that at the point of admission it is my clinical judgment that the patient will require inpatient hospital care spanning beyond 2 midnights from the point of admission due to high intensity of service, high risk for further deterioration and high frequency of surveillance required.Tacey Ruiz MD [facility]  How to contact the [facility] Attending or Consulting provider [place] or covering provider during after hours [place], for this patient?   Check the care team in [facility] and look for a) attending/consulting [facility] provider listed and b) the [facility] team listed Log into [email or web address] and use [place]'s universal password to access. If you do not have the password, please contact the hospital operator. Locate the [facility] provider you are looking for under [facility] and page to a number that you can be directly reached. If you still have difficulty reaching the provider, please page the [facility] (Director on Call) for the Hospitalists listed on amion for assistance.  02/26/2021, 12:44 PM

## 2021-02-26 NOTE — Sepsis Progress Note (Signed)
Sepsis protocol is being followed by [facility]. 

## 2021-02-26 NOTE — ED Notes (Signed)
Pt taken to CT at this time.

## 2021-02-26 NOTE — ED Notes (Signed)
Lab called critical lactic 2.1.  EDP notified.

## 2021-02-26 NOTE — ED Provider Notes (Signed)
[facility] Emergency Department Provider Note ____________________________________________   Event Date/Time   First MD Initiated Contact with Patient 02/26/21 1117     (approximate)  I have reviewed the triage vital signs and the nursing notes.  HISTORY  Chief Complaint Respiratory Distress   HPI Patrick Montoya is a 80 y.o. malewho presents to the ED for evaluation of shortness of breath, fever.   Chart review indicates hx COPD not on home O2, parkinsonism, HTN.   Patient presents to the ED via EMS from home for evaluation of increasing shortness of breath and fevers.  Patient with recent treatment for UTI, finishing 5 days of antibiotics yesterday.   Reports increasing shortness of breath and cough for the past few days, developing fever last night.  Reports a chest "sensation" to the right side of his chest that is mild, but denies any chest pain or pressure.  Reports he feels like his UTI is better.  Denies abdominal pain, emesis, diarrhea or dysuria.  Past Medical History:  Diagnosis Date   Basal cell carcinoma 07/25/2015   L zygoma inf to lat canthus   Basal cell carcinoma 06/11/2018   L nasal ala   BPH (benign prostatic hyperplasia)    COPD (chronic obstructive pulmonary disease) (HCC)    Elevated liver enzymes    Emphysema of lung (HCC)    Gastric ulcer    GERD (gastroesophageal reflux disease)    Hepatitis B    Hypertension    Parkinson's disease (HCC)    Prostate hypertrophy    Rotator cuff tear right   UTI (urinary tract infection)     Patient Active Problem List   Diagnosis Date Noted   Sternal fracture with retrosternal contusion, closed, initial encounter 10/31/2017   Chronic bronchitis ([place]) 05/28/2015   Gastro-esophageal reflux disease without esophagitis 05/26/2015   Insomnia, 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   Chronic obstructive pulmonary disease ([place]) 04/11/2014   Benign prostatic hypertrophy without urinary obstruction  01/21/2014   Acute bronchitis with chronic obstructive pulmonary disease (COPD) ([place]) 01/21/2014   Essential (primary) hypertension 01/21/2014   SINUSITIS- ACUTE-NOS 07/03/2007   RENAL CALCULUS 06/05/2007   HYPERTENSION 02/27/2007   COPD 02/27/2007   GERD 02/27/2007   PEPTIC ULCER DISEASE 02/27/2007   BENIGN PROSTATIC HYPERTROPHY 02/27/2007   BURSITIS, RIGHT SHOULDER 02/27/2007   COLONIC POLYPS, HX OF 02/27/2007   FROZEN RIGHT SHOULDER 12/05/2006    Past Surgical History:  Procedure Laterality Date   CHOLECYSTECTOMY     COLONOSCOPY WITH PROPOFOL N/A 10/20/2015   Procedure: COLONOSCOPY WITH PROPOFOL;  Surgeon: Manya Silvas, MD;  Location: [facility] ENDOSCOPY;  Service: Endoscopy;  Laterality: N/A;   COLONOSCOPY WITH PROPOFOL N/A 01/24/2021   Procedure: COLONOSCOPY WITH PROPOFOL;  Surgeon: Toledo, Benay Pike, MD;  Location: [facility] ENDOSCOPY;  Service: Gastroenterology;  Laterality: N/A;   ESOPHAGOGASTRODUODENOSCOPY (EGD) WITH PROPOFOL N/A 10/20/2015   Procedure: ESOPHAGOGASTRODUODENOSCOPY (EGD) WITH PROPOFOL;  Surgeon: Manya Silvas, MD;  Location: [facility] ENDOSCOPY;  Service: Endoscopy;  Laterality: N/A;   GREEN LIGHT LASER TURP (TRANSURETHRAL RESECTION OF PROSTATE N/A 11/17/2018   Procedure: GREEN LIGHT LASER TURP (TRANSURETHRAL RESECTION OF PROSTATE;  Surgeon: Royston Cowper, MD;  Location: [facility] ORS;  Service: Urology;  Laterality: N/A;   INGUINAL HERNIA REPAIR Right 02/18/2019   Procedure: HERNIA REPAIR INGUINAL ADULT;  Surgeon: Royston Cowper, MD;  Location: [facility] ORS;  Service: Urology;  Laterality: Right;   INSERTION OF MESH Right 02/18/2019   Procedure: INSERTION OF MESH;  Surgeon: Maryan Puls  R, MD;  Location: [facility] ORS;  Service: Urology;  Laterality: Right;   TONSILLECTOMY     TRANSURETHRAL MICROWAVE THERAPY      Prior to Admission medications   Medication Sig Start Date End Date Taking? Authorizing Provider  albuterol (PROVENTIL HFA;VENTOLIN HFA) 108 (90 Base) MCG/ACT inhaler  Inhale 2 puffs into the lungs every 4 (four) hours as needed for wheezing or shortness of breath.    [provider]  albuterol (PROVENTIL) (2.5 MG/3ML) 0.083% nebulizer solution Take 5 mg by nebulization at bedtime.    [provider]  ALPRAZolam Duanne Moron) 0.5 MG tablet Take 0.5 mg by mouth at bedtime.    [provider]  amLODipine (NORVASC) 2.5 MG tablet Take 2.5 mg by mouth daily. 08/02/18   [provider]  aspirin 81 MG tablet Take 81 mg by mouth daily.      [provider]  carbidopa-levodopa (SINEMET CR) 50-200 MG tablet Take 1 tablet by mouth 3 (three) times daily.     [provider]  cephALEXin (KEFLEX) 500 MG capsule Take 1 capsule (500 mg total) by mouth 4 (four) times daily. 02/18/19   Royston Cowper, MD  ciprofloxacin (CIPRO) 500 MG tablet Take 1 tablet (500 mg total) by mouth 2 (two) times daily. Patient not taking: Reported on 02/05/2019 11/17/18   Royston Cowper, MD  Cyanocobalamin ([employer] VITAMIN B-12) 2000 MCG TBCR Take 2,000 mcg by mouth daily.    [provider]  docusate sodium (COLACE) 100 MG capsule Take 100 mg by mouth daily.    [provider]  doxazosin (CARDURA) 4 MG tablet Take 4 mg by mouth daily.     [provider]  KLOR-CON M10 10 MEQ tablet Take 30 mEq by mouth daily.  10/31/15   [provider]  Meth-Hyo-M Bl-Na Phos-Ph Sal (URIBEL) 118 MG CAPS Take 1 capsule (118 mg total) by mouth every 6 (six) hours as needed. 11/17/18   Royston Cowper, MD  NUCYNTA 50 MG tablet Take 1 tablet (50 mg total) by mouth every 6 (six) hours as needed. 1 TO 2 TABS Q 6 HOURS PRN PAIN 02/18/19   Royston Cowper, MD  omeprazole (PRILOSEC) 40 MG capsule Take 40 mg by mouth daily.  11/06/15   [provider]  QUEtiapine (SEROQUEL) 50 MG tablet Take 50 mg by mouth at bedtime.    [provider]  rizatriptan (MAXALT) 10 MG tablet Take 10 mg by mouth as needed for migraine. May repeat in 2 hours  if needed    [provider]  SUMAtriptan (IMITREX) 50 MG tablet Take 50 mg by mouth every 2 (two) hours as needed for migraine (max 2 tablets/24hrs). May repeat in 2 hours if headache persists or recurs.    [provider]  tiotropium (SPIRIVA) 18 MCG inhalation capsule Take 1 capsule by mouth daily.  05/31/16   [provider]    Allergies Patient has no known allergies.  Family History  Problem Relation Age of Onset   Colon cancer Mother    Hepatitis Son    Prostate cancer Neg Hx    Kidney cancer Neg Hx     Social History Social History   Tobacco Use   Smoking status: Former    Packs/day: 1.00    Years: 62.00    Pack years: 62.00    Types: Cigarettes   Smokeless tobacco: Never  Vaping Use   Vaping Use: Never used  Substance Use Topics  Alcohol use: Yes    Comment: occasional   Drug use: No    Review of Systems  Constitutional: Positive for fever/chills Eyes: No visual changes. ENT: No sore throat. Cardiovascular: Denies chest pain. Respiratory: Positive for cough and shortness of breath. Gastrointestinal: No abdominal pain.  No nausea, no vomiting.  No diarrhea.  No constipation. Genitourinary: Negative for dysuria. Musculoskeletal: Negative for back pain. Skin: Negative for rash. Neurological: Negative for headaches, focal weakness or numbness. ____________________________________________   PHYSICAL EXAM:  VITAL SIGNS: Vitals:   02/26/21 1130 02/26/21 1200  BP: 92/76 123/74  Pulse: 97 97  Resp: (!) 29 (!) 30  Temp:    SpO2: 92% 92%     Constitutional: Alert and oriented.  Warm to the touch.  No distress. Eyes: Conjunctivae are normal. PERRL. EOMI. Head: Atraumatic. Nose: No congestion/rhinnorhea. Mouth/Throat: Mucous membranes are moist.  Oropharynx non-erythematous. Neck: No stridor. No cervical spine tenderness to palpation. Cardiovascular: Tachycardic rate, regular rhythm. Grossly normal heart sounds.  Good  peripheral circulation. Respiratory: Mild tachypnea to the low 20s.  No retractions.  Diffuse expiratory wheezes and slight decrease in air movement throughout. Gastrointestinal: Soft , nondistended, nontender to palpation.    Mild right-sided CVA tenderness is present. Musculoskeletal: No lower extremity tenderness nor edema.  No joint effusions. No signs of acute trauma. Neurologic:  Normal speech and language. No gross focal neurologic deficits are appreciated. No gait instability noted. Skin:  Skin is warm, dry and intact. No rash noted. Psychiatric: Mood and affect are normal. Speech and behavior are normal.  ____________________________________________   LABS (all labs ordered are listed, but only abnormal results are displayed)  Labs Reviewed  LACTIC ACID, PLASMA - Abnormal; Notable for the following components:      Result Value   Lactic Acid, Venous 2.1 (*)    All other components within normal limits  COMPREHENSIVE METABOLIC PANEL - Abnormal; Notable for the following components:   Glucose, Bld 113 (*)    BUN 24 (*)    AST 91 (*)    All other components within normal limits  CBC WITH DIFFERENTIAL/PLATELET - Abnormal; Notable for the following components:   WBC 15.0 (*)    MCV 100.2 (*)    MCH 34.8 (*)    Neutro Abs 13.8 (*)    Lymphs Abs 0.5 (*)    All other components within normal limits  URINALYSIS, COMPLETE (UACMP) WITH MICROSCOPIC - Abnormal; Notable for the following components:   Color, Urine RED (*)    APPearance CLEAR (*)    Glucose, UA   (*)    Value: TEST NOT REPORTED DUE TO COLOR INTERFERENCE OF URINE PIGMENT   Hgb urine dipstick   (*)    Value: TEST NOT REPORTED DUE TO COLOR INTERFERENCE OF URINE PIGMENT   Bilirubin Urine   (*)    Value: TEST NOT REPORTED DUE TO COLOR INTERFERENCE OF URINE PIGMENT   Ketones, ur   (*)    Value: TEST NOT REPORTED DUE TO COLOR INTERFERENCE OF URINE PIGMENT   Protein, ur   (*)    Value: TEST NOT REPORTED DUE TO COLOR  INTERFERENCE OF URINE PIGMENT   Nitrite   (*)    Value: TEST NOT REPORTED DUE TO COLOR INTERFERENCE OF URINE PIGMENT   Leukocytes,Ua   (*)    Value: TEST NOT REPORTED DUE TO COLOR INTERFERENCE OF URINE PIGMENT   All other components within normal limits  RESP PANEL BY RT-PCR (FLU A&B, COVID) ARPGX2  CULTURE, BLOOD (SINGLE)  URINE CULTURE  CULTURE, BLOOD (SINGLE)  PROTIME-INR  APTT  LACTIC ACID, PLASMA  PROCALCITONIN   ____________________________________________  12 Lead EKG  Sinus rhythm, rate of 104 bpm.  Normal axis and intervals.  No evidence of acute ischemia. ____________________________________________  RADIOLOGY  ED MD interpretation: CXR reviewed by me with RLL infiltration concerning for pneumonia  Official radiology report(s): DG Chest Port 1 View  Result Date: 02/26/2021 CLINICAL DATA:  copd, sepsis, eval infiltrate, ptx EXAM: PORTABLE CHEST 1 VIEW COMPARISON:  Radiograph 12/14/2020, chest CT 02/19/2021 FINDINGS: Unchanged cardiomediastinal silhouette. There are diffuse interstitial opacities. There are airspace opacities in the right mid lung and right lower lung. There is no large pleural effusion or visible pneumothorax. Hyperinflated lungs with changes of emphysema. There is no acute osseous abnormality. Thoracic spondylosis. IMPRESSION: Diffuse interstitial opacities with airspace disease in the right mid to lower lung. Findings could reflect asymmetric pulmonary edema and/or multifocal pneumonia. Electronically Signed   By: Maurine Simmering M.D.   On: 02/26/2021 12:21    ____________________________________________   PROCEDURES and INTERVENTIONS  Procedure(s) performed (including Critical Care):  .1-3 Lead EKG Interpretation Performed by: Vladimir Crofts, MD Authorized by: Vladimir Crofts, MD     Interpretation: abnormal     ECG rate:  106   ECG rate assessment: tachycardic     Rhythm: sinus tachycardia     Ectopy: none     Conduction: normal   .Critical  Care Performed by: Vladimir Crofts, MD Authorized by: Vladimir Crofts, MD   Critical care provider statement:    Critical care time (minutes):  30   Critical care was necessary to treat or prevent imminent or life-threatening deterioration of the following conditions:  Sepsis   Critical care was time spent personally by me on the following activities:  Ordering and performing treatments and interventions, ordering and review of laboratory studies, ordering and review of radiographic studies, pulse oximetry, re-evaluation of patient's condition, review of old charts and examination of patient  Medications  methylPREDNISolone sodium succinate (SOLU-MEDROL) 125 mg/2 mL injection 125 mg (has no administration in time range)  lactated ringers infusion (has no administration in time range)  lactated ringers bolus 1,000 mL (1,000 mLs Intravenous New Bag/Given 02/26/21 1151)    And  lactated ringers bolus 1,000 mL (1,000 mLs Intravenous New Bag/Given 02/26/21 1209)    And  lactated ringers bolus 500 mL (has no administration in time range)  cefTRIAXone (ROCEPHIN) 2 g in sodium chloride 0.9 % 100 mL IVPB (0 g Intravenous Stopped 02/26/21 1223)  azithromycin (ZITHROMAX) 500 mg in sodium chloride 0.9 % 250 mL IVPB (500 mg Intravenous New Bag/Given 02/26/21 1208)  ipratropium-albuterol (DUONEB) 0.5-2.5 (3) MG/3ML nebulizer solution 3 mL (3 mLs Nebulization Given 02/26/21 1213)    ____________________________________________   MDM / ED COURSE   80 year old male with COPD presents to the ED with evidence of sepsis due to pneumonia requiring medical admission.  He is tachycardic, tachypneic and hypoxic on room air, but is hemodynamically stable without evidence of shock.  Requiring up to 4-5 L nasal cannula.  Improving clinical picture with fluid resuscitation and breathing treatments.  Lactic is marginally elevated and leukocytosis is noted.  No evidence of COVID, coronary ischemia.  He reports resolution of  his recent UTI, and urine only has a few whites.  Doubt pyelonephritis considering he presents with respiratory symptoms.  Urinalysis is somewhat clouded by his recent Azo intake.  We will discuss with medicine for admission.  Clinical  Course as of 02/26/21 1231  Mon Feb 26, 2021  1133 Reassessed and got some additional information.  Clinically similar.  We try to figure out what antibiotic he was on, likely Bactrim.  He thinks it was "Septra DS." [DS]  [id number].  Appears more comfortable.  We will admit with medicine [DS]    Clinical Course User Index [DS] Vladimir Crofts, MD    ____________________________________________   FINAL CLINICAL IMPRESSION(S) / ED DIAGNOSES  Final diagnoses:  Sepsis with acute hypoxic respiratory failure without septic shock, due to unspecified organism [facility])  COPD exacerbation ([place])  Hypoxia  Community acquired pneumonia of right lower lobe of lung     ED Discharge Orders     None        Kamika Goodloe   Note:  This document was prepared using Set designer software and may include unintentional dictation errors.    Vladimir Crofts, MD 02/26/21 [phone]

## 2021-02-26 NOTE — Consult Note (Signed)
CODE SEPSIS - PHARMACY COMMUNICATION  **Broad Spectrum Antibiotics should be administered within 1 hour of Sepsis diagnosis**  Time Code Sepsis Called/Page Received: 1135  Antibiotics Ordered: azithromycin, ceftriaxone  Time of 1st antibiotic administration: [place], PharmD Pharmacy Resident  02/26/2021 11:35 AM

## 2021-02-26 NOTE — ED Triage Notes (Addendum)
Pt arrives via [facility], states woke with resp distress at 0730 this morning.  [facility] reports O2 sat 85 RA. Pt and wife stating febrile for several days w/neg home covid testing.  Temp on arrival 99.4 Oral. [facility] reports chest tightness on right side relieved with O2 at 5L.

## 2021-02-27 DIAGNOSIS — J9601 Acute respiratory failure with hypoxia: Secondary | ICD-10-CM

## 2021-02-27 LAB — CBC
HCT: 38.5 % — ABNORMAL LOW (ref 39.0–52.0)
Hemoglobin: 13.2 g/dL (ref 13.0–17.0)
MCH: 35.3 pg — ABNORMAL HIGH (ref 26.0–34.0)
MCHC: 34.3 g/dL (ref 30.0–36.0)
MCV: 102.9 fL — ABNORMAL HIGH (ref 80.0–100.0)
Platelets: 155 10*3/uL (ref 150–400)
RBC: 3.74 MIL/uL — ABNORMAL LOW (ref 4.22–5.81)
RDW: 13.2 % (ref 11.5–15.5)
WBC: 23.1 10*3/uL — ABNORMAL HIGH (ref 4.0–10.5)
nRBC: 0 % (ref 0.0–0.2)

## 2021-02-27 LAB — BASIC METABOLIC PANEL
Anion gap: 6 (ref 5–15)
BUN: 18 mg/dL (ref 8–23)
CO2: 25 mmol/L (ref 22–32)
Calcium: 8.2 mg/dL — ABNORMAL LOW (ref 8.9–10.3)
Chloride: 109 mmol/L (ref 98–111)
Creatinine, Ser: 0.7 mg/dL (ref 0.61–1.24)
GFR, Estimated: >60 mL/min (ref >60)
Glucose, Bld: 175 mg/dL — ABNORMAL HIGH (ref 70–99)
Potassium: 4.2 mmol/L (ref 3.5–5.1)
Sodium: 140 mmol/L (ref 135–145)

## 2021-02-27 LAB — URINE CULTURE
Culture: <10000 — AB
Special Requests: NORMAL

## 2021-02-27 LAB — PROCALCITONIN: Procalcitonin: 0.59 ng/mL

## 2021-02-27 LAB — EXPECTORATED SPUTUM ASSESSMENT W GRAM STAIN, RFLX TO RESP C: Special Requests: NORMAL

## 2021-02-27 LAB — LACTIC ACID, PLASMA: Lactic Acid, Venous: 1.2 mmol/L (ref 0.5–1.9)

## 2021-02-27 LAB — VITAMIN B12: Vitamin B-12: 2259 pg/mL — ABNORMAL HIGH (ref 180–914)

## 2021-02-27 LAB — STREP PNEUMONIAE URINARY ANTIGEN: Strep Pneumo Urinary Antigen: NEGATIVE

## 2021-02-27 MED ORDER — SODIUM CHLORIDE 0.9 % IV SOLN
500.0000 mg | INTRAVENOUS | Status: AC
  Administered 2021-02-27 – 2021-02-28 (×2): 500 mg via INTRAVENOUS
  Filled 2021-02-27 (×2): qty 500

## 2021-02-27 MED ORDER — ALBUTEROL SULFATE (2.5 MG/3ML) 0.083% IN NEBU
2.5000 mg | INHALATION_SOLUTION | Freq: Four times a day (QID) | RESPIRATORY_TRACT | Status: DC
  Administered 2021-02-27 – 2021-02-28 (×5): 2.5 mg via RESPIRATORY_TRACT
  Filled 2021-02-27 (×5): qty 3

## 2021-02-27 MED ORDER — FLUTICASONE-UMECLIDIN-VILANT 100-62.5-25 MCG/INH IN AEPB: 1.0000 | INHALATION_SPRAY | Freq: Every day | RESPIRATORY_TRACT | Status: DC

## 2021-02-27 MED ORDER — CEFTRIAXONE SODIUM 1 G IJ SOLR
1.0000 g | INTRAMUSCULAR | Status: DC
  Administered 2021-02-27 – 2021-02-28 (×2): 1 g via INTRAVENOUS
  Filled 2021-02-27 (×3): qty 10

## 2021-02-27 MED ORDER — ASPIRIN 81 MG PO TABS: 81.0000 mg | ORAL_TABLET | Freq: Every day | ORAL | Status: DC

## 2021-02-27 NOTE — Progress Notes (Signed)
  Chaplain On-Call responded to [place] Order "to create or update Advance Directive".  Chaplain met patient and provided the AD documents and education to him.  The patient stated that he will discuss the documents with his wife.  Chaplain assured the patient of the availability of Chaplains for additional support as needed.  Chaplain Pollyann Samples M.Div., [facility]

## 2021-02-27 NOTE — Progress Notes (Signed)
PROGRESS NOTE    Patrick Montoya  RDE:[id number] DOB: [birth year] DOA: 02/26/2021 PCP: [facility]  Outpatient Specialists: pulmonology, neurology, cardiology, urology    Brief Narrative:   Patrick Montoya is a 80 y.o. male with medical history significant for COPD, pulmonary nodules, Parkinson's, migraines, hypertension, arthritis, who presents to the emergency department on 02/26/2021 with shortness of breath and right-sided chest and abdominal pain. Onset of patient's symptoms was 1 day before admission and duration is intermittent. He also developed chest pain when the shortness of breath started; he was sitting watching TV at the time. Pain is located in the lower lateral right chest wall, does not radiate, is up to 9/10 at times and is characterized as a dull pain. Symptoms are alleviated by nothing and exacerbated by taking a deep breath and any exertion. He noticed his heart rate was fast. Associated symptoms: Cough productive of thick, white/clear sputum and wheezing. Had fever to 100.5 on day of admission and also had chills. He notes right lower quadrant and right lateral abdominal pain that also began the day before admission, has been intermittent, is characterized as dull/achy, is alleviated by nothing and exacerbated by nothing.  He denies any nausea, vomiting, diarrhea, bloody stool, constipation, dysuria or hematuria but does note that he has continued bladder spasms. He also reports some left nipple pain that began on the day of admission. Treatments: He tried using his inhalers but it did not help. He has oxygen at home but does not use it. His O2 sat at home by his home pulse ox was 80%.  He had a recent urinary tract infection the week before admission and completed 5 days of Bactrim to treat it. He took Azo for his bladder spasms prior to arrival in the emergency department. He has no history of any CAD or CHF. Patient reports within the last 2 weeks he received his COVID  booster, flu shot, and pneumonia shot.   Assessment & Plan:   Principal Problem:   Acute respiratory failure with hypoxia (HCC) Active Problems:   COPD with acute exacerbation (HCC)   Sepsis due to undetermined organism ([place])   Bacterial pneumonia  # Community Acquired pneumonia # COPD with exacerbation # Sepsis RLL infiltrate on CTA. Several days cough and dyspnea. Think pneumonia primary driver. Sepsis by tachycardia, leukocytosis, hypoxia, elevated lactate. Hemodynamically stable, lactate has normalized w/ fluids. Covid/flu neg - continue ceftriaxone/azithromycin (started 10/17) - f/u urine antigens - continue prednisone (steroids started 10/17) - continue trelegy, prn albuterol - f/u sputum and blood cultures - PO ad lib   # Acute hypoxic respiratory failure O2 in 80s in ED and at home. Patient has been prescribed o2 before, says not using currently, unclear if has true o2 need at baseline. Wife says he does need o2 at home - [place] O2, wean as able, goal 88-92  # Macrocytosis - f/u b12  # HTN Here bp wnl - hold home amlodipine in setting of infection, add back as needed  # Parkinsons - home sinemet, seroquel  # Anxiety - home alprazolam  # BPH - home cardura   DVT prophylaxis: lovenox Code Status: full Family Communication: wife hilda updated telephonically 10/18  Level of care: Med-Surg Status is: Inpatient  Remains inpatient appropriate because: severity of illness   Consultants:  none  Procedures: none  Antimicrobials:  Ceftriaxone/azithromycin    Subjective: This morning says fatigue improved, still come cough and dyspnea  Objective: Vitals:   02/26/21 2039 02/27/21  0153 02/27/21 0500 02/27/21 0522  BP: 133/82 112/76  122/72  Pulse: 82 87  83  Resp: 20 16  18   Temp: 97.6 F (36.4 C) 97.7 F (36.5 C)  97.8 F (36.6 C)  TempSrc: Oral Oral  Oral  SpO2: 95% 92%  97%  Weight:   76.9 kg   Height:        Intake/Output Summary (Last 24  hours) at 02/27/2021 0805 Last data filed at 02/27/2021 0540 Gross per 24 hour  Intake 3393.85 ml  Output 750 ml  Net 2643.85 ml   Filed Weights   02/26/21 1120 02/27/21 0500  Weight: 77.1 kg 76.9 kg    Examination:  General exam: Appears calm and comfortable  Respiratory system: decreased sounds right lung base, rales at bases Cardiovascular system: S1 & S2 heard, RRR. No JVD, murmurs, rubs, gallops or clicks. No pedal edema. Gastrointestinal system: Abdomen is nondistended, soft and nontender. No organomegaly or masses felt. Normal bowel sounds heard. Central nervous system: Alert and oriented. No focal neurological deficits. Extremities: Symmetric 5 x 5 power. Skin: No rashes, lesions or ulcers Psychiatry: Judgement and insight appear normal. Mood & affect appropriate.     Data Reviewed: I have personally reviewed following labs and imaging studies  CBC: Recent Labs  Lab 02/26/21 1130 02/27/21 0105  WBC 15.0* 23.1*  NEUTROABS 13.8*  --   HGB 15.1 13.2  HCT 43.5 38.5*  MCV 100.2* 102.9*  PLT 183 914   Basic Metabolic Panel: Recent Labs  Lab 02/26/21 1130 02/27/21 0105  NA 137 140  K 3.8 4.2  CL 102 109  CO2 23 25  GLUCOSE 113* 175*  BUN 24* 18  CREATININE 0.75 0.70  CALCIUM 9.0 8.2*   GFR: Estimated Creatinine Clearance: 67.7 mL/min (by C-G formula based on SCr of 0.7 mg/dL). Liver Function Tests: Recent Labs  Lab 02/26/21 1130  AST 91*  ALT 39  ALKPHOS 77  BILITOT 1.0  PROT 7.1  ALBUMIN 3.9   No results for input(s): LIPASE, AMYLASE in the last 168 hours. No results for input(s): AMMONIA in the last 168 hours. Coagulation Profile: Recent Labs  Lab 02/26/21 1130  INR 1.0   Cardiac Enzymes: No results for input(s): CKTOTAL, CKMB, CKMBINDEX, TROPONINI in the last 168 hours. BNP (last 3 results) No results for input(s): PROBNP in the last 8760 hours. HbA1C: No results for input(s): HGBA1C in the last 72 hours. CBG: No results for  input(s): GLUCAP in the last 168 hours. Lipid Profile: No results for input(s): CHOL, HDL, LDLCALC, TRIG, CHOLHDL, LDLDIRECT in the last 72 hours. Thyroid Function Tests: No results for input(s): TSH, T4TOTAL, FREET4, T3FREE, THYROIDAB in the last 72 hours. Anemia Panel: No results for input(s): VITAMINB12, FOLATE, FERRITIN, TIBC, IRON, RETICCTPCT in the last 72 hours. Urine analysis:    Component Value Date/Time   COLORURINE RED (A) 02/26/2021 1130   APPEARANCEUR CLEAR (A) 02/26/2021 1130   APPEARANCEUR Clear 10/26/2012 1721   LABSPEC 1.023 02/26/2021 1130   LABSPEC 1.021 10/26/2012 1721   PHURINE  02/26/2021 1130    TEST NOT REPORTED DUE TO COLOR INTERFERENCE OF URINE PIGMENT   GLUCOSEU (A) 02/26/2021 1130    TEST NOT REPORTED DUE TO COLOR INTERFERENCE OF URINE PIGMENT   GLUCOSEU Negative 10/26/2012 1721   HGBUR (A) 02/26/2021 1130    TEST NOT REPORTED DUE TO COLOR INTERFERENCE OF URINE PIGMENT   HGBUR negative 01/01/2008 0000   BILIRUBINUR (A) 02/26/2021 1130    TEST NOT  REPORTED DUE TO COLOR INTERFERENCE OF URINE PIGMENT   BILIRUBINUR Negative 10/26/2012 1721   KETONESUR (A) 02/26/2021 1130    TEST NOT REPORTED DUE TO COLOR INTERFERENCE OF URINE PIGMENT   PROTEINUR (A) 02/26/2021 1130    TEST NOT REPORTED DUE TO COLOR INTERFERENCE OF URINE PIGMENT   UROBILINOGEN 1.0 08/29/2010 0041   NITRITE (A) 02/26/2021 1130    TEST NOT REPORTED DUE TO COLOR INTERFERENCE OF URINE PIGMENT   LEUKOCYTESUR (A) 02/26/2021 1130    TEST NOT REPORTED DUE TO COLOR INTERFERENCE OF URINE PIGMENT   LEUKOCYTESUR 3+ 10/26/2012 1721   Sepsis Labs: @LABRCNTIP (procalcitonin:4,lacticidven:4)  ) Recent Results (from the past 240 hour(s))  Resp Panel by RT-PCR (Flu A&B, Covid) Nasopharyngeal Swab     Status: None   Collection Time: 02/26/21 11:30 AM   Specimen: Nasopharyngeal Swab; Nasopharyngeal(NP) swabs in vial transport medium  Result Value Ref Range Status   SARS Coronavirus 2 by RT PCR NEGATIVE  NEGATIVE Final    Comment: (NOTE) SARS-CoV-2 target nucleic acids are NOT DETECTED.  The SARS-CoV-2 RNA is generally detectable in upper respiratory specimens during the acute phase of infection. The lowest concentration of SARS-CoV-2 viral copies this assay can detect is 138 copies/mL. A negative result does not preclude SARS-Cov-2 infection and should not be used as the sole basis for treatment or other patient management decisions. A negative result may occur with  improper specimen collection/handling, submission of specimen other than nasopharyngeal swab, presence of viral mutation(s) within the areas targeted by this assay, and inadequate number of viral copies(<138 copies/mL). A negative result must be combined with clinical observations, patient history, and epidemiological information. The expected result is Negative.  Fact Sheet for Patients:  EntrepreneurPulse.com.au  Fact Sheet for Healthcare Providers:  IncredibleEmployment.be  This test is no t yet approved or cleared by the [place] FDA and  has been authorized for detection and/or diagnosis of SARS-CoV-2 by FDA under an Emergency Use Authorization (EUA). This EUA will remain  in effect (meaning this test can be used) for the duration of the COVID-19 declaration under Section 564(b)(1) of the Act, 21 U.S.C.section 360bbb-3(b)(1), unless the authorization is terminated  or revoked sooner.       Influenza A by PCR NEGATIVE NEGATIVE Final   Influenza B by PCR NEGATIVE NEGATIVE Final    Comment: (NOTE) The Xpert Xpress SARS-CoV-2/FLU/RSV plus assay is intended as an aid in the diagnosis of influenza from Nasopharyngeal swab specimens and should not be used as a sole basis for treatment. Nasal washings and aspirates are unacceptable for Xpert Xpress SARS-CoV-2/FLU/RSV testing.  Fact Sheet for Patients: EntrepreneurPulse.com.au  Fact Sheet for Healthcare  Providers: IncredibleEmployment.be  This test is not yet approved or cleared by the [place] FDA and has been authorized for detection and/or diagnosis of SARS-CoV-2 by FDA under an Emergency Use Authorization (EUA). This EUA will remain in effect (meaning this test can be used) for the duration of the COVID-19 declaration under Section 564(b)(1) of the Act, 21 U.S.C. section 360bbb-3(b)(1), unless the authorization is terminated or revoked.  Performed at [facility], [address] [place], [place] [address]   Culture, blood (single)     Status: None (Preliminary result)   Collection Time: 02/26/21 11:30 AM   Specimen: BLOOD  Result Value Ref Range Status   Specimen Description BLOOD BLOOD RIGHT FOREARM  Final   Special Requests   Final    BOTTLES DRAWN AEROBIC AND ANAEROBIC Blood Culture adequate volume  Culture   Final    NO GROWTH < 24 HOURS Performed at [facility], [address] [place], [place] [address]    Report Status PENDING  Incomplete  CULTURE, BLOOD (ROUTINE X 2) w Reflex to ID Panel     Status: None (Preliminary result)   Collection Time: 02/26/21  1:59 PM   Specimen: BLOOD  Result Value Ref Range Status   Specimen Description BLOOD BLOOD LEFT ARM  Final   Special Requests   Final    BOTTLES DRAWN AEROBIC AND ANAEROBIC Blood Culture results may not be optimal due to an excessive volume of blood received in culture bottles   Culture   Final    NO GROWTH < 24 HOURS Performed at [facility], [address] [place], [place] [address]    Report Status PENDING  Incomplete         Radiology Studies: CT Angio Chest Pulmonary Embolism (PE) W or WO Contrast  Result Date: 02/26/2021 CLINICAL DATA:  Respiratory distress, hypoxia and fever. EXAM: CT ANGIOGRAPHY CHEST WITH CONTRAST TECHNIQUE: Multidetector CT imaging of the chest was performed using the standard protocol during bolus administration of  intravenous contrast. Multiplanar CT image reconstructions and MIPs were obtained to evaluate the vascular anatomy. CONTRAST:  59mL OMNIPAQUE IOHEXOL 350 MG/ML SOLN COMPARISON:  None. FINDINGS: Cardiovascular: The pulmonary arteries are adequately opacified. There is no evidence of pulmonary embolism. Atherosclerosis of the thoracic aorta without evidence of aneurysmal disease. Visualized proximal great vessels are normally patent. The heart size is normal. No pericardial fluid identified. Calcified coronary artery plaque present. Mediastinum/Nodes: No enlarged mediastinal, hilar, or axillary lymph nodes. Thyroid gland, trachea, and esophagus demonstrate no significant findings. Lungs/Pleura: Dense pneumonia seen throughout much of the right lower lobe. There also is airspace disease extending into the posterior right upper lobe and anterior right middle lobe. Stable 4 mm fissural nodule at the level of the minor fissure. No pleural fluid or pneumothorax. Stable advanced emphysematous lung disease. Upper Abdomen: No acute abnormality. Musculoskeletal: No chest wall abnormality. No acute or significant osseous findings. Review of the MIP images confirms the above findings. IMPRESSION: 1. Acute pneumonia primarily throughout the right lower lobe but also extending into the posterior right upper lobe and right middle lobe. 2. No evidence of pulmonary embolism. 3. Stable advanced emphysema. 4. Coronary atherosclerosis. Electronically Signed   By: Aletta Edouard M.D.   On: 02/26/2021 15:01   CT ABDOMEN PELVIS W CONTRAST  Result Date: 02/26/2021 CLINICAL DATA:  Respiratory distress, hypoxia and fever. Recent urinary tract infection. EXAM: CT ABDOMEN AND PELVIS WITH CONTRAST TECHNIQUE: Multidetector CT imaging of the abdomen and pelvis was performed using the standard protocol following bolus administration of intravenous contrast. CONTRAST:  13mL OMNIPAQUE IOHEXOL 350 MG/ML SOLN COMPARISON:  Prior CT of the abdomen  on 08/29/2010 FINDINGS: Lower chest: Right lower lobe pneumonia. See previously dictated chest CTA report. Hepatobiliary: No focal liver abnormality is seen. Status post cholecystectomy. No biliary dilatation. Pancreas: Unremarkable. No pancreatic ductal dilatation or surrounding inflammatory changes. Spleen: Normal in size without focal abnormality. Adrenals/Urinary Tract: Adrenal glands are unremarkable. Kidneys are normal, without renal calculi, focal lesion, or hydronephrosis. Bladder is mildly distended with suggestion of mild circumferential wall thickening and irregularity. This may reflect chronic outlet obstruction or component of underlying cystitis. There is a TURP defect at the bladder base. Stomach/Bowel: Bowel shows no evidence of obstruction, ileus, inflammation or lesion. No free intraperitoneal air identified. Vascular/Lymphatic: Atherosclerosis of the abdominal aorta  and iliac arteries without evidence of aneurysmal disease. No lymphadenopathy identified. 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 Central prostate calcifications. Other: No abdominal wall hernia or abnormality. No abdominopelvic ascites. Musculoskeletal: No acute or significant osseous findings. IMPRESSION: 1. No acute findings in the abdomen or pelvis. 2. Mild bladder wall irregularity and thickening which may be reflective of chronic outlet obstruction or component of underlying cystitis. 3. 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 Electronically Signed   By: Aletta Edouard M.D.   On: 02/26/2021 15:14   DG Chest Port 1 View  Result Date: 02/26/2021 CLINICAL DATA:  copd, sepsis, eval infiltrate, ptx EXAM: PORTABLE CHEST 1 VIEW COMPARISON:  Radiograph 12/14/2020, chest CT 02/19/2021 FINDINGS: Unchanged cardiomediastinal silhouette. There are diffuse interstitial opacities. There are airspace opacities in the right mid lung and right lower lung. There is no large pleural effusion or visible pneumothorax. Hyperinflated lungs  with changes of emphysema. There is no acute osseous abnormality. Thoracic spondylosis. IMPRESSION: Diffuse interstitial opacities with airspace disease in the right mid to lower lung. Findings could reflect asymmetric pulmonary edema and/or multifocal pneumonia. Electronically Signed   By: Maurine Simmering M.D.   On: 02/26/2021 12:21        Scheduled Meds:  albuterol  2.5 mg Nebulization Q2200   ALPRAZolam  0.5 mg Oral QHS   amLODipine  2.5 mg Oral Daily   aspirin  324 mg Oral Once   aspirin EC  81 mg Oral Daily   aspirin  81 mg Oral Daily   carbidopa-levodopa  1 tablet Oral TID   docusate sodium  100 mg Oral Daily   doxazosin  4 mg Oral Daily   enoxaparin (LOVENOX) injection  40 mg Subcutaneous Q24H   ferrous gluconate  324 mg Oral Q breakfast   fluticasone furoate-vilanterol  1 puff Inhalation Daily   And   umeclidinium bromide  1 puff Inhalation Daily   Fluticasone-Umeclidin-Vilant  1 Inhaler Inhalation Daily   ipratropium-albuterol  3 mL Nebulization Q6H   methylPREDNISolone (SOLU-MEDROL) injection  60 mg Intravenous Q24H   Followed by   Derrill Memo ON 02/28/2021] predniSONE  40 mg Oral Q breakfast   pantoprazole  40 mg Oral Daily   potassium chloride  30 mEq Oral Daily   QUEtiapine  50 mg Oral QHS   vitamin B-12  2,000 mcg Oral Daily   Continuous Infusions:  sodium chloride 125 mL/hr at 02/27/21 0610   azithromycin Stopped (02/26/21 1322)   cefTRIAXone (ROCEPHIN)  IV Stopped (02/26/21 1223)     LOS: 1 day    Time spent: 53 min    Desma Maxim, MD [facility]   If 7PM-7AM, please contact night-coverage [email or web address] Password [email or web address] 02/27/2021, 8:05 AM

## 2021-02-27 NOTE — Progress Notes (Signed)
Pt received from ED around 830pm, lab called for abnormal lactic acid 2.0. Pt received 1L bolus of NaCl then on continuous infusion of NaCl at 125 ml/hr. Lactic acid drawn at around 1am, current value of lactic is normal - 1.2.

## 2021-02-27 NOTE — TOC Initial Note (Signed)
Transition of Care [facility]) - Initial/Assessment Note    Patient Details  Name: Patrick Montoya MRN: [id number] Date of Birth: 02-04-1941  Transition of Care [facility]) CM/SW Contact:    Shelbie Hutching, RN Phone Number: 02/27/2021, 2:58 PM  Clinical Narrative:                 Patient admitted to the hospital with acute respiratory failure with hypoxemia requiring acute O2.  Patient has COPD, he reports that he quit smoking over a year ago.  Patient's wife also has COPD.  Patient reports that he is independent at home and takes care of all the house work, his wife will help cook sometimes but he does everything else.  Neither of them drive but they use [employer] and have special assistance through a [employer] for things like transportation and COPD medications that are expensive.  Patient gets food stamps and he has a good supplemental insurance that covers all his medical expenses.   Patient does agree to having home health services at discharge, he thinks that would be beneficial.  He and his wife have both used [facility] in the past and would like to use them again,  Corene Cornea with [facility] accepts [facility] referral.    Expected Discharge Plan: [place] Barriers to Discharge: Continued Medical Work up   Patient Goals and CMS Choice Patient states their goals for this hospitalization and ongoing recovery are:: Patient wants to breath better and get back home CMS Medicare.gov Compare Post Acute Care list provided to:: Patient Choice offered to / list presented to : Patient  Expected Discharge Plan and Services Expected Discharge Plan: [place]   Discharge Planning Services: CM Consult Post Acute Care Choice: [place] arrangements for the past 2 months: Single Family Home                 DME Arranged: N/A DME Agency: NA       HH Arranged: RN, PT [place] Agency: [place] ([place]) Date HH Agency Contacted: 02/27/21 Time [place]: 36 Representative spoke with at [place]: Corene Cornea  Prior Living Arrangements/Services Living arrangements for the past 2 months: [place] Lives with:: Spouse Patient language and need for interpreter reviewed:: Yes Do you feel safe going back to the place where you live?: Yes      Need for Family Participation in Patient Care: Yes (Comment) Care giver support system in place?: Yes (comment) (wife)   Criminal Activity/Legal Involvement Pertinent to Current Situation/Hospitalization: No - Comment as needed  Activities of Daily Living Home Assistive Devices/Equipment: None ADL Screening (condition at time of admission) Patient's cognitive ability adequate to safely complete daily activities?: Yes Is the patient deaf or have difficulty hearing?: Yes Does the patient have difficulty seeing, even when wearing glasses/contacts?: No Does the patient have difficulty concentrating, remembering, or making decisions?: Yes Patient able to express need for assistance with ADLs?: Yes Does the patient have difficulty dressing or bathing?: No Independently performs ADLs?: Yes (appropriate for developmental age) Does the patient have difficulty walking or climbing stairs?: Yes Weakness of Legs: Both Weakness of Arms/Hands: None  Permission Sought/Granted Permission sought to share information with : Case Manager, Family Supports, Other (comment) Permission granted to share information with : Yes, Verbal Permission Granted  Share Information with NAME: [name]  Permission granted to share info w AGENCY: [facility]  Permission granted to share info w Relationship: wife  Permission granted to share info w Contact Information: [phone]  Emotional Assessment Appearance:: Appears stated age Attitude/Demeanor/Rapport: Engaged Affect (typically observed): Accepting Orientation: : Oriented to Self, Oriented to Place, Oriented to  Time, Oriented to Situation Alcohol / Substance Use:  Not Applicable Psych Involvement: No (comment)  Admission diagnosis:  Hypoxia [R09.02] COPD exacerbation ([place]) [J44.1] Acute respiratory failure with hypoxia ([place]) [J96.01] Community acquired pneumonia of right lower lobe of lung [J18.9] Sepsis with acute hypoxic respiratory failure without septic shock, due to unspecified organism ([place]) [A41.9, R65.20, J96.01] Patient Active Problem List   Diagnosis Date Noted   Acute respiratory failure with hypoxia ([place]) 02/26/2021   Sepsis due to undetermined organism ([place]) 02/26/2021   Bacterial pneumonia 02/26/2021   Parkinson disease ([place]) 03/26/2019   Sternal fracture with retrosternal contusion, closed, initial encounter 10/31/2017   Chronic bronchitis ([place]) 05/28/2015   Gastro-esophageal reflux disease without esophagitis 05/26/2015   Insomnia, 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   COPD with acute exacerbation ([place]) 04/11/2014   Benign prostatic hypertrophy without urinary obstruction 01/21/2014   Acute bronchitis with chronic obstructive pulmonary disease (COPD) ([place]) 01/21/2014   Essential (primary) hypertension 01/21/2014   SINUSITIS- ACUTE-NOS 07/03/2007   RENAL CALCULUS 06/05/2007   HYPERTENSION 02/27/2007   COPD 02/27/2007   GERD 02/27/2007   PEPTIC ULCER DISEASE 02/27/2007   BENIGN PROSTATIC HYPERTROPHY 02/27/2007   BURSITIS, RIGHT SHOULDER 02/27/2007   COLONIC POLYPS, HX OF 02/27/2007   FROZEN RIGHT SHOULDER 12/05/2006   PCP:  [place] [facility]:   [facility] [address] [place] [address] Phone: [phone] Fax: [place] [address]), [place] - [address] [place]) [place] [address] Phone: [phone] Fax: [phone]     Social Determinants of Health (SDOH) Interventions    Readmission Risk Interventions No flowsheet data found.

## 2021-02-28 LAB — CBC
HCT: 35.3 % — ABNORMAL LOW (ref 39.0–52.0)
Hemoglobin: 11.9 g/dL — ABNORMAL LOW (ref 13.0–17.0)
MCH: 35 pg — ABNORMAL HIGH (ref 26.0–34.0)
MCHC: 33.7 g/dL (ref 30.0–36.0)
MCV: 103.8 fL — ABNORMAL HIGH (ref 80.0–100.0)
Platelets: 148 10*3/uL — ABNORMAL LOW (ref 150–400)
RBC: 3.4 MIL/uL — ABNORMAL LOW (ref 4.22–5.81)
RDW: 13.3 % (ref 11.5–15.5)
WBC: 11.6 10*3/uL — ABNORMAL HIGH (ref 4.0–10.5)
nRBC: 0 % (ref 0.0–0.2)

## 2021-02-28 LAB — LEGIONELLA PNEUMOPHILA SEROGP 1 UR AG: L. pneumophila Serogp 1 Ur Ag: NEGATIVE

## 2021-02-28 LAB — BASIC METABOLIC PANEL
Anion gap: 0 — ABNORMAL LOW (ref 5–15)
BUN: 20 mg/dL (ref 8–23)
CO2: 30 mmol/L (ref 22–32)
Calcium: 8.4 mg/dL — ABNORMAL LOW (ref 8.9–10.3)
Chloride: 111 mmol/L (ref 98–111)
Creatinine, Ser: 0.69 mg/dL (ref 0.61–1.24)
GFR, Estimated: >60 mL/min (ref >60)
Glucose, Bld: 94 mg/dL (ref 70–99)
Potassium: 3.9 mmol/L (ref 3.5–5.1)
Sodium: 141 mmol/L (ref 135–145)

## 2021-02-28 MED ORDER — PREDNISONE 20 MG PO TABS: 40.0000 mg | ORAL_TABLET | Freq: Every day | ORAL | 0 refills | Status: AC

## 2021-02-28 MED ORDER — CEFDINIR 300 MG PO CAPS: 300.0000 mg | ORAL_CAPSULE | Freq: Two times a day (BID) | ORAL | 0 refills | Status: AC

## 2021-02-28 MED ORDER — AZITHROMYCIN 500 MG PO TABS: 500.0000 mg | ORAL_TABLET | Freq: Every day | ORAL | 0 refills | Status: AC

## 2021-02-28 NOTE — Discharge Summary (Signed)
Physician Discharge Summary  Patrick Montoya:[id number] DOB: Feb 29, 1941 DOA: 02/26/2021  PCP: [place] date: 02/26/2021 Discharge date: 02/28/2021  Admitted From: home Disposition:  home  Recommendations for Outpatient Follow-up:  Follow up with PCP in 1-2 weeks Please obtain BMP/CBC in one week Please follow up patient's blood pressure.  Amlodipine was held during admission and BP's were controlled, so this was stopped at time of discharge. Follow up on patient's need for home oxygen supplementation.  At time of discharge, requiring 2-3 L/min  Home Health: PT, RN  Equipment/Devices: Oxygen   Discharge Condition: Stable  CODE STATUS: Full  Diet recommendation: Regular     Discharge Diagnoses: Principal Problem:   Acute respiratory failure with hypoxia ([place]) Active Problems:   COPD with acute exacerbation ([place])   Sepsis due to undetermined organism ([place])   Bacterial pneumonia    Summary of HPI and Hospital Course:  "Patrick Montoya is a 80 y.o. male with medical history significant for COPD, pulmonary nodules, Parkinson's, migraines, hypertension, arthritis, who presents to the emergency department on 02/26/2021 with shortness of breath and right-sided chest and abdominal pain. Onset of patient's symptoms was 1 day before admission and duration is intermittent. He also developed chest pain when the shortness of breath started; he was sitting watching TV at the time. Pain is located in the lower lateral right chest wall, does not radiate, is up to 9/10 at times and is characterized as a dull pain. Symptoms are alleviated by nothing and exacerbated by taking a deep breath and any exertion. He noticed his heart rate was fast. Associated symptoms: Cough productive of thick, white/clear sputum and wheezing. Had fever to 100.5 on day of admission and also had chills. He notes right lower quadrant and right lateral abdominal pain that also began the day before  admission, has been intermittent, is characterized as dull/achy, is alleviated by nothing and exacerbated by nothing.  He denies any nausea, vomiting, diarrhea, bloody stool, constipation, dysuria or hematuria but does note that he has continued bladder spasms. He also reports some left nipple pain that began on the day of admission. Treatments: He tried using his inhalers but it did not help. He has oxygen at home but does not use it. His O2 sat at home by his home pulse ox was 80%.  He had a recent urinary tract infection the week before admission and completed 5 days of Bactrim to treat it. He took Azo for his bladder spasms prior to arrival in the emergency department. He has no history of any CAD or CHF. Patient reports within the last 2 weeks he received his COVID booster, flu shot, and pneumonia shot."   Severe Sepsis due to Community Acquired pneumonia complicated by COPD with acute exacerbation Presented with several days cough and dyspnea. RLL infiltrate on CTA consistent with PNA.  Sepsis on admission as evidenced by by tachycardia, leukocytosis, imaging evidence of PNA and organ dysfunction as evidenced by lactic acidosis.  Sepsis physiology resolved.  Lactate has normalized w/ fluids.  Covid/flu neg Treated with empiric ceftriaxone/azithromycin (started 10/17) Prednisone for COPD exacerbation (steroids started 10/17) Continue trelegy, prn albuterol Sputum and blood cultures negative to day - final pending  Discharged with 3 more days of Zithromax and Omnicef to complete 5 day course of antibiotics.  Also sent with 3 more days Prednisone given underlying COPD with exacerbation triggered by Community-acquired Pneumonia.    Acute hypoxic respiratory failure due to above O2 in 80s  in ED and at home.  Patient reported prescribed O2 in past, unclear if actively set up with [facility] for oxygen, but wife is. Patient reported using 3 L/min oxygen at home at baseline.  TOC to ensure pt active for  home oxygen at discharge. Patient advised to use O2 as needed to keep spO2 above 90%   Macrocytosis - B12 level elevated.  Unclear etiology.  Repeat CBC in follow up.   Hypertension - BP controlled here with home amlodipine held.   Stopped amlodipine at discharge.  Advised home BP monitoring and PCP follow up.   Parkinson's disease Continue Sinemet, seroquel   Anxiety  Continue home alprazolam   BPH Continue home cardura    Generalized Weakness - Home health PT and RN have been arranged as recommended by PT.    Patient requests discharge home today, as his wife needs his assistance at home.   He is clinically improved, reports at his baseline oxygen requirement.  Stable for discharge.   Discharge Instructions     Allergies as of 02/28/2021   No Known Allergies      Medication List     STOP taking these medications    amLODipine 2.5 MG tablet Commonly known as: NORVASC   cephALEXin 500 MG capsule Commonly known as: Keflex   ciprofloxacin 500 MG tablet Commonly known as: CIPRO   Uribel 118 MG Caps       TAKE these medications    albuterol (2.5 MG/3ML) 0.083% nebulizer solution Commonly known as: PROVENTIL Take 5 mg by nebulization at bedtime.   albuterol 108 (90 Base) MCG/ACT inhaler Commonly known as: VENTOLIN HFA Inhale 2 puffs into the lungs every 4 (four) hours as needed for wheezing or shortness of breath.   ALPRAZolam 0.5 MG tablet Commonly known as: XANAX Take 0.5 mg by mouth at bedtime.   aspirin 81 MG tablet Take 81 mg by mouth daily.   azithromycin 500 MG tablet Commonly known as: Zithromax Take 1 tablet (500 mg total) by mouth daily for 3 days. Take 1 tablet daily for 3 days.   carbidopa-levodopa 50-200 MG tablet Commonly known as: SINEMET CR Take 1 tablet by mouth 3 (three) times daily.   cefdinir 300 MG capsule Commonly known as: OMNICEF Take 1 capsule (300 mg total) by mouth 2 (two) times daily for 3 days.   [employer] Vitamin  B-12 2000 MCG Tbcr Generic drug: Cyanocobalamin Take 2,000 mcg by mouth daily.   docusate sodium 100 MG capsule Commonly known as: COLACE Take 100 mg by mouth daily.   doxazosin 4 MG tablet Commonly known as: CARDURA Take 4 mg by mouth daily.   ferrous gluconate 324 MG tablet Commonly known as: FERGON Take 324 mg by mouth daily with breakfast.   Klor-Con M10 10 MEQ tablet Generic drug: potassium chloride Take 30 mEq by mouth daily.   Nucynta 50 MG tablet Generic drug: tapentadol Take 1 tablet (50 mg total) by mouth every 6 (six) hours as needed. 1 TO 2 TABS Q 6 HOURS PRN PAIN   omeprazole 40 MG capsule Commonly known as: PRILOSEC Take 40 mg by mouth daily.   predniSONE 20 MG tablet Commonly known as: DELTASONE Take 2 tablets (40 mg total) by mouth daily with breakfast for 3 days.   QUEtiapine 50 MG tablet Commonly known as: SEROQUEL Take 50 mg by mouth at bedtime.   rizatriptan 10 MG tablet Commonly known as: MAXALT Take 10 mg by mouth as needed for migraine. May repeat in  2 hours if needed   SUMAtriptan 50 MG tablet Commonly known as: IMITREX Take 50 mg by mouth every 2 (two) hours as needed for migraine (max 2 tablets/24hrs). May repeat in 2 hours if headache persists or recurs.   tiotropium 18 MCG inhalation capsule Commonly known as: SPIRIVA Take 1 capsule by mouth daily.   Trelegy Ellipta 100-62.5-25 MCG/ACT Aepb Generic drug: Fluticasone-Umeclidin-Vilant Inhale 1 Inhaler into the lungs daily.        No Known Allergies   If you experience worsening of your admission symptoms, develop shortness of breath, life threatening emergency, suicidal or homicidal thoughts you must seek medical attention immediately by calling 911 or calling your MD immediately  if symptoms less severe.    Please note   You were cared for by a hospitalist during your hospital stay. If you have any questions about your discharge medications or the care you received while you  were in the hospital after you are discharged, you can call the unit and asked to speak with the hospitalist on call if the hospitalist that took care of you is not available. Once you are discharged, your primary care physician will handle any further medical issues. Please note that NO REFILLS for any discharge medications will be authorized once you are discharged, as it is imperative that you return to your primary care physician (or establish a relationship with a primary care physician if you do not have one) for your aftercare needs so that they can reassess your need for medications and monitor your lab values.   Consultations: None    Procedures/Studies: CT CHEST WO CONTRAST  Result Date: 02/20/2021 CLINICAL DATA:  Follow-up lung nodules EXAM: CT CHEST WITHOUT CONTRAST TECHNIQUE: Multidetector CT imaging of the chest was performed following the standard protocol without IV contrast. COMPARISON:  Chest CT dated January 14, 2020 FINDINGS: Cardiovascular: Normal heart size. Trace pericardial fluid. Atherosclerotic disease of the thoracic aorta. Coronary artery calcifications of the LAD. Mediastinum/Nodes: Esophagus and thyroid are unremarkable. No pathologically enlarged lymph nodes in the chest. Lungs/Pleura: Central airways are patent. Severe upper lobe predominant centrilobular emphysema. New irregular solid pulmonary nodules of the left upper lobe and left lower lobe largest located in the left lower lobe measures 2.2 x 1.4 cm on series 3, image 110. Previously seen small solid pulmonary nodules are stable. Reference nodule of the right middle lobe measuring mm on series 3, image 99. Upper Abdomen: Cholecystectomy clips.  No acute abnormality. Musculoskeletal: No chest wall mass or suspicious bone lesions identified. IMPRESSION: New irregular solid pulmonary nodules of the left upper lobe and left lower lobe, largest measures 18 mm in mean diameter, favor infectious or inflammatory process,  recommend follow-up chest CT in 3 months to ensure resolution. Consider one of the following in 3 months for both low-risk and high-risk individuals: (a) repeat chest CT, (b) follow-up PET-CT, or (c) tissue sampling. This recommendation follows the consensus statement: Guidelines for Management of Incidental Pulmonary Nodules Detected on CT Images: From the Fleischner Society 2017; Radiology 2017; 284:228-243. Previously seen solid pulmonary nodules are stable. Coronary artery calcifications, aortic Atherosclerosis (ICD10-I70.0) and Emphysema (ICD10-J43.9). Electronically Signed   By: Yetta Glassman M.D.   On: 02/20/2021 16:20   CT Angio Chest Pulmonary Embolism (PE) W or WO Contrast  Result Date: 02/26/2021 CLINICAL DATA:  Respiratory distress, hypoxia and fever. EXAM: CT ANGIOGRAPHY CHEST WITH CONTRAST TECHNIQUE: Multidetector CT imaging of the chest was performed using the standard protocol during bolus administration of intravenous  contrast. Multiplanar CT image reconstructions and MIPs were obtained to evaluate the vascular anatomy. CONTRAST:  82mL OMNIPAQUE IOHEXOL 350 MG/ML SOLN COMPARISON:  None. FINDINGS: Cardiovascular: The pulmonary arteries are adequately opacified. There is no evidence of pulmonary embolism. Atherosclerosis of the thoracic aorta without evidence of aneurysmal disease. Visualized proximal great vessels are normally patent. The heart size is normal. No pericardial fluid identified. Calcified coronary artery plaque present. Mediastinum/Nodes: No enlarged mediastinal, hilar, or axillary lymph nodes. Thyroid gland, trachea, and esophagus demonstrate no significant findings. Lungs/Pleura: Dense pneumonia seen throughout much of the right lower lobe. There also is airspace disease extending into the posterior right upper lobe and anterior right middle lobe. Stable 4 mm fissural nodule at the level of the minor fissure. No pleural fluid or pneumothorax. Stable advanced emphysematous lung  disease. Upper Abdomen: No acute abnormality. Musculoskeletal: No chest wall abnormality. No acute or significant osseous findings. Review of the MIP images confirms the above findings. IMPRESSION: 1. Acute pneumonia primarily throughout the right lower lobe but also extending into the posterior right upper lobe and right middle lobe. 2. No evidence of pulmonary embolism. 3. Stable advanced emphysema. 4. Coronary atherosclerosis. Electronically Signed   By: Aletta Edouard M.D.   On: 02/26/2021 15:01   CT ABDOMEN PELVIS W CONTRAST  Result Date: 02/26/2021 CLINICAL DATA:  Respiratory distress, hypoxia and fever. Recent urinary tract infection. EXAM: CT ABDOMEN AND PELVIS WITH CONTRAST TECHNIQUE: Multidetector CT imaging of the abdomen and pelvis was performed using the standard protocol following bolus administration of intravenous contrast. CONTRAST:  72mL OMNIPAQUE IOHEXOL 350 MG/ML SOLN COMPARISON:  Prior CT of the abdomen on 08/29/2010 FINDINGS: Lower chest: Right lower lobe pneumonia. See previously dictated chest CTA report. Hepatobiliary: No focal liver abnormality is seen. Status post cholecystectomy. No biliary dilatation. Pancreas: Unremarkable. No pancreatic ductal dilatation or surrounding inflammatory changes. Spleen: Normal in size without focal abnormality. Adrenals/Urinary Tract: Adrenal glands are unremarkable. Kidneys are normal, without renal calculi, focal lesion, or hydronephrosis. Bladder is mildly distended with suggestion of mild circumferential wall thickening and irregularity. This may reflect chronic outlet obstruction or component of underlying cystitis. There is a TURP defect at the bladder base. Stomach/Bowel: Bowel shows no evidence of obstruction, ileus, inflammation or lesion. No free intraperitoneal air identified. Vascular/Lymphatic: Atherosclerosis of the abdominal aorta and iliac arteries without evidence of aneurysmal disease. No lymphadenopathy identified. 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 Central prostate calcifications. Other: No abdominal wall hernia or abnormality. No abdominopelvic ascites. Musculoskeletal: No acute or significant osseous findings. IMPRESSION: 1. No acute findings in the abdomen or pelvis. 2. Mild bladder wall irregularity and thickening which may be reflective of chronic outlet obstruction or component of underlying cystitis. 3. 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 Electronically Signed   By: Aletta Edouard M.D.   On: 02/26/2021 15:14   DG Chest Port 1 View  Result Date: 02/26/2021 CLINICAL DATA:  copd, sepsis, eval infiltrate, ptx EXAM: PORTABLE CHEST 1 VIEW COMPARISON:  Radiograph 12/14/2020, chest CT 02/19/2021 FINDINGS: Unchanged cardiomediastinal silhouette. There are diffuse interstitial opacities. There are airspace opacities in the right mid lung and right lower lung. There is no large pleural effusion or visible pneumothorax. Hyperinflated lungs with changes of emphysema. There is no acute osseous abnormality. Thoracic spondylosis. IMPRESSION: Diffuse interstitial opacities with airspace disease in the right mid to lower lung. Findings could reflect asymmetric pulmonary edema and/or multifocal pneumonia. Electronically Signed   By: Maurine Simmering M.D.   On: 02/26/2021 12:21  Subjective: Patient seen at bedside on rounds morning.  Reports feeling much better, cough improved.  Denies fevers or chills.  Requests discharge home today as his wife needs his assistance.  States that he typically will use 3 L/min oxygen at home when he needs it.  No other acute complaints.   Discharge Exam: Vitals:   02/27/21 2346 02/28/21 0740  BP: 113/61 137/85  Pulse: 80 71  Resp: 14 20  Temp: 97.8 F (36.6 C) 98 F (36.7 C)  SpO2: 93% 95%   Vitals:   02/27/21 1712 02/27/21 1953 02/27/21 2346 02/28/21 0740  BP: 118/77 120/76 113/61 137/85  Pulse: 84 88 80 71  Resp: 18 16 14 20   Temp: 98 F (36.7 C) 98.1 F (36.7 C) 97.8 F  (36.6 C) 98 F (36.7 C)  TempSrc:  Oral    SpO2: 92% 94% 93% 95%  Weight:      Height:        General: Pt is alert, awake, not in acute distress Cardiovascular: RRR, S1/S2 +, no rubs, no gallops Respiratory: CTA bilaterally, good aeration, no wheezing, no rhonchi on 3 L/min oxygen via nasal cannula Abdominal: Soft, NT, ND, bowel sounds + Extremities: no edema, no cyanosis    The results of significant diagnostics from this hospitalization (including imaging, microbiology, ancillary and laboratory) are listed below for reference.     Microbiology: Recent Results (from the past 240 hour(s))  Urine Culture     Status: Abnormal   Collection Time: 02/26/21 11:30 AM   Specimen: Urine, Random  Result Value Ref Range Status   Specimen Description   Final    URINE, RANDOM Performed at [facility], [address] [place], [place] [address]    Special Requests   Final    NONE Performed at [facility], [address], [place], [place] [address]    Culture (A)  Final    <10,000 COLONIES/mL INSIGNIFICANT GROWTH Performed at [facility] [address], [place], [place] [address]    Report Status 02/27/2021 FINAL  Final  Resp Panel by RT-PCR (Flu A&B, Covid) Nasopharyngeal Swab     Status: None   Collection Time: 02/26/21 11:30 AM   Specimen: Nasopharyngeal Swab; Nasopharyngeal(NP) swabs in vial transport medium  Result Value Ref Range Status   SARS Coronavirus 2 by RT PCR NEGATIVE NEGATIVE Final    Comment: (NOTE) SARS-CoV-2 target nucleic acids are NOT DETECTED.  The SARS-CoV-2 RNA is generally detectable in upper respiratory specimens during the acute phase of infection. The lowest concentration of SARS-CoV-2 viral copies this assay can detect is 138 copies/mL. A negative result does not preclude SARS-Cov-2 infection and should not be used as the sole basis for treatment or other patient management decisions. A negative result may occur with   improper specimen collection/handling, submission of specimen other than nasopharyngeal swab, presence of viral mutation(s) within the areas targeted by this assay, and inadequate number of viral copies(<138 copies/mL). A negative result must be combined with clinical observations, patient history, and epidemiological information. The expected result is Negative.  Fact Sheet for Patients:  EntrepreneurPulse.com.au  Fact Sheet for Healthcare Providers:  IncredibleEmployment.be  This test is no t yet approved or cleared by the [place] FDA and  has been authorized for detection and/or diagnosis of SARS-CoV-2 by FDA under an Emergency Use Authorization (EUA). This EUA will remain  in effect (meaning this test can be used) for the duration of the COVID-19 declaration under Section  564(b)(1) of the Act, 21 U.S.C.section 360bbb-3(b)(1), unless the authorization is terminated  or revoked sooner.       Influenza A by PCR NEGATIVE NEGATIVE Final   Influenza B by PCR NEGATIVE NEGATIVE Final    Comment: (NOTE) The Xpert Xpress SARS-CoV-2/FLU/RSV plus assay is intended as an aid in the diagnosis of influenza from Nasopharyngeal swab specimens and should not be used as a sole basis for treatment. Nasal washings and aspirates are unacceptable for Xpert Xpress SARS-CoV-2/FLU/RSV testing.  Fact Sheet for Patients: EntrepreneurPulse.com.au  Fact Sheet for Healthcare Providers: IncredibleEmployment.be  This test is not yet approved or cleared by the [place] FDA and has been authorized for detection and/or diagnosis of SARS-CoV-2 by FDA under an Emergency Use Authorization (EUA). This EUA will remain in effect (meaning this test can be used) for the duration of the COVID-19 declaration under Section 564(b)(1) of the Act, 21 U.S.C. section 360bbb-3(b)(1), unless the authorization is terminated  or revoked.  Performed at [facility], [address] [place], [place] [address]   Culture, blood (single)     Status: None (Preliminary result)   Collection Time: 02/26/21 11:30 AM   Specimen: BLOOD  Result Value Ref Range Status   Specimen Description BLOOD BLOOD RIGHT FOREARM  Final   Special Requests   Final    BOTTLES DRAWN AEROBIC AND ANAEROBIC Blood Culture adequate volume   Culture   Final    NO GROWTH 2 DAYS Performed at [facility], [address] [place], [place] [address]    Report Status PENDING  Incomplete  CULTURE, BLOOD (ROUTINE X 2) w Reflex to ID Panel     Status: None (Preliminary result)   Collection Time: 02/26/21  1:59 PM   Specimen: BLOOD  Result Value Ref Range Status   Specimen Description BLOOD BLOOD LEFT ARM  Final   Special Requests   Final    BOTTLES DRAWN AEROBIC AND ANAEROBIC Blood Culture results may not be optimal due to an excessive volume of blood received in culture bottles   Culture   Final    NO GROWTH 2 DAYS Performed at [facility], [address], [place], [place] [address]    Report Status PENDING  Incomplete  Urine Culture     Status: Abnormal   Collection Time: 02/26/21  2:17 PM   Specimen: Urine, Clean Catch  Result Value Ref Range Status   Specimen Description   Final    URINE, CLEAN CATCH Performed at [facility], [address], [place], [place] [address]    Special Requests   Final    Normal Performed at [facility], [address] [place], [place] [address]    Culture MULTIPLE SPECIES PRESENT, SUGGEST RECOLLECTION (A)  Final   Report Status 02/27/2021 FINAL  Final  Expectorated Sputum Assessment w Gram Stain, Rflx to Resp Cult     Status: None   Collection Time: 02/27/21  3:29 PM   Specimen: Expectorated Sputum  Result Value Ref Range Status   Specimen Description EXPECTORATED SPUTUM  Final   Special Requests Normal  Final   Sputum evaluation   Final    THIS  SPECIMEN IS ACCEPTABLE FOR SPUTUM CULTURE Performed at [facility], [address] [place], [place] [address]    Report Status 02/27/2021 FINAL  Final  Culture, Respiratory w Gram Stain     Status: None (Preliminary result)   Collection Time: 02/27/21  3:29 PM  Result Value Ref Range  Status   Specimen Description   Final    EXPECTORATED SPUTUM Performed at [facility], [address] [place], [place] [address]    Special Requests   Final    Normal Reflexed from [phone] Performed at [facility], [place], [place] [address]    Gram Stain   Final    RARE SQUAMOUS EPITHELIAL CELLS PRESENT RARE WBC PRESENT, PREDOMINANTLY PMN RARE GRAM POSITIVE COCCI RARE GRAM POSITIVE RODS    Culture   Final    TOO YOUNG TO READ Performed at [facility], [place] [address] [place], [place] [address]    Report Status PENDING  Incomplete     Labs: BNP (last 3 results) No results for input(s): BNP in the last 8760 hours. Basic Metabolic Panel: Recent Labs  Lab 02/26/21 1130 02/27/21 0105 02/28/21 0406  NA 137 140 141  K 3.8 4.2 3.9  CL 102 109 111  CO2 23 25 30   GLUCOSE 113* 175* 94  BUN 24* 18 20  CREATININE 0.75 0.70 0.69  CALCIUM 9.0 8.2* 8.4*   Liver Function Tests: Recent Labs  Lab 02/26/21 1130  AST 91*  ALT 39  ALKPHOS 77  BILITOT 1.0  PROT 7.1  ALBUMIN 3.9   No results for input(s): LIPASE, AMYLASE in the last 168 hours. No results for input(s): AMMONIA in the last 168 hours. CBC: Recent Labs  Lab 02/26/21 1130 02/27/21 0105 02/28/21 0406  WBC 15.0* 23.1* 11.6*  NEUTROABS 13.8*  --   --   HGB 15.1 13.2 11.9*  HCT 43.5 38.5* 35.3*  MCV 100.2* 102.9* 103.8*  PLT 183 155 148*   Cardiac Enzymes: No results for input(s): CKTOTAL, CKMB, CKMBINDEX, TROPONINI in the last 168 hours. BNP: Invalid input(s): POCBNP CBG: No results for input(s): GLUCAP in the last 168 hours. D-Dimer Recent Labs    02/26/21 1443   DDIMER 0.73*   Hgb A1c No results for input(s): HGBA1C in the last 72 hours. Lipid Profile No results for input(s): CHOL, HDL, LDLCALC, TRIG, CHOLHDL, LDLDIRECT in the last 72 hours. Thyroid function studies No results for input(s): TSH, T4TOTAL, T3FREE, THYROIDAB in the last 72 hours.  Invalid input(s): FREET3 Anemia work up Recent Labs    02/27/21 0840  VITAMINB12 2,259*   Urinalysis    Component Value Date/Time   COLORURINE RED (A) 02/26/2021 1130   APPEARANCEUR CLEAR (A) 02/26/2021 1130   APPEARANCEUR Clear 10/26/2012 1721   LABSPEC 1.023 02/26/2021 1130   LABSPEC 1.021 10/26/2012 1721   PHURINE  02/26/2021 1130    TEST NOT REPORTED DUE TO COLOR INTERFERENCE OF URINE PIGMENT   GLUCOSEU (A) 02/26/2021 1130    TEST NOT REPORTED DUE TO COLOR INTERFERENCE OF URINE PIGMENT   GLUCOSEU Negative 10/26/2012 1721   HGBUR (A) 02/26/2021 1130    TEST NOT REPORTED DUE TO COLOR INTERFERENCE OF URINE PIGMENT   HGBUR negative 01/01/2008 0000   BILIRUBINUR (A) 02/26/2021 1130    TEST NOT REPORTED DUE TO COLOR INTERFERENCE OF URINE PIGMENT   BILIRUBINUR Negative 10/26/2012 1721   KETONESUR (A) 02/26/2021 1130    TEST NOT REPORTED DUE TO COLOR INTERFERENCE OF URINE PIGMENT   PROTEINUR (A) 02/26/2021 1130    TEST NOT REPORTED DUE TO COLOR INTERFERENCE OF URINE PIGMENT   UROBILINOGEN 1.0 08/29/2010 0041   NITRITE (A) 02/26/2021 1130    TEST NOT REPORTED DUE TO COLOR INTERFERENCE OF URINE PIGMENT   LEUKOCYTESUR (A) 02/26/2021 1130  TEST NOT REPORTED DUE TO COLOR INTERFERENCE OF URINE PIGMENT   LEUKOCYTESUR 3+ 10/26/2012 1721   [facility] Invalid input(s): PROCALCITONIN,  WBC,  LACTICIDVEN Microbiology Recent Results (from the past 240 hour(s))  Urine Culture     Status: Abnormal   Collection Time: 02/26/21 11:30 AM   Specimen: Urine, Random  Result Value Ref Range Status   Specimen Description   Final    URINE, RANDOM Performed at [facility], [address] [place], [place] [address]    Special Requests   Final    NONE Performed at [facility], [address], [place], [place] [address]    Culture (A)  Final    <10,000 COLONIES/mL INSIGNIFICANT GROWTH Performed at [facility], [place] [address] [place], [place] [address]    Report Status 02/27/2021 FINAL  Final  Resp Panel by RT-PCR (Flu A&B, Covid) Nasopharyngeal Swab     Status: None   Collection Time: 02/26/21 11:30 AM   Specimen: Nasopharyngeal Swab; Nasopharyngeal(NP) swabs in vial transport medium  Result Value Ref Range Status   SARS Coronavirus 2 by RT PCR NEGATIVE NEGATIVE Final    Comment: (NOTE) SARS-CoV-2 target nucleic acids are NOT DETECTED.  The SARS-CoV-2 RNA is generally detectable in upper respiratory specimens during the acute phase of infection. The lowest concentration of SARS-CoV-2 viral copies this assay can detect is 138 copies/mL. A negative result does not preclude SARS-Cov-2 infection and should not be used as the sole basis for treatment or other patient management decisions. A negative result may occur with  improper specimen collection/handling, submission of specimen other than nasopharyngeal swab, presence of viral mutation(s) within the areas targeted by this assay, and inadequate number of viral copies(<138 copies/mL). A negative result must be combined with clinical observations, patient history, and epidemiological information. The expected result is Negative.  Fact Sheet for Patients:  EntrepreneurPulse.com.au  Fact Sheet for Healthcare Providers:  IncredibleEmployment.be  This test is no t yet approved or cleared by the [place] FDA and  has been authorized for detection and/or diagnosis of SARS-CoV-2 by FDA under an Emergency Use Authorization (EUA). This EUA will remain  in effect (meaning this test can be used) for the duration of the COVID-19 declaration under Section 564(b)(1) of  the Act, 21 U.S.C.section 360bbb-3(b)(1), unless the authorization is terminated  or revoked sooner.       Influenza A by PCR NEGATIVE NEGATIVE Final   Influenza B by PCR NEGATIVE NEGATIVE Final    Comment: (NOTE) The Xpert Xpress SARS-CoV-2/FLU/RSV plus assay is intended as an aid in the diagnosis of influenza from Nasopharyngeal swab specimens and should not be used as a sole basis for treatment. Nasal washings and aspirates are unacceptable for Xpert Xpress SARS-CoV-2/FLU/RSV testing.  Fact Sheet for Patients: EntrepreneurPulse.com.au  Fact Sheet for Healthcare Providers: IncredibleEmployment.be  This test is not yet approved or cleared by the [place] FDA and has been authorized for detection and/or diagnosis of SARS-CoV-2 by FDA under an Emergency Use Authorization (EUA). This EUA will remain in effect (meaning this test can be used) for the duration of the COVID-19 declaration under Section 564(b)(1) of the Act, 21 U.S.C. section 360bbb-3(b)(1), unless the authorization is terminated or revoked.  Performed at [facility], [address] [place], [place] [address]   Culture, blood (single)     Status: None (Preliminary result)   Collection Time: 02/26/21 11:30 AM   Specimen: BLOOD  Result Value Ref Range Status  Specimen Description BLOOD BLOOD RIGHT FOREARM  Final   Special Requests   Final    BOTTLES DRAWN AEROBIC AND ANAEROBIC Blood Culture adequate volume   Culture   Final    NO GROWTH 2 DAYS Performed at [facility], [address] [place], [place] [address]    Report Status PENDING  Incomplete  CULTURE, BLOOD (ROUTINE X 2) w Reflex to ID Panel     Status: None (Preliminary result)   Collection Time: 02/26/21  1:59 PM   Specimen: BLOOD  Result Value Ref Range Status   Specimen Description BLOOD BLOOD LEFT ARM  Final   Special Requests   Final    BOTTLES DRAWN AEROBIC AND ANAEROBIC Blood  Culture results may not be optimal due to an excessive volume of blood received in culture bottles   Culture   Final    NO GROWTH 2 DAYS Performed at [facility], [address], [place], [place] [address]    Report Status PENDING  Incomplete  Urine Culture     Status: Abnormal   Collection Time: 02/26/21  2:17 PM   Specimen: Urine, Clean Catch  Result Value Ref Range Status   Specimen Description   Final    URINE, CLEAN CATCH Performed at [facility], [address], [place], [place] [address]    Special Requests   Final    Normal Performed at [facility], [address] [place], [place] [address]    Culture MULTIPLE SPECIES PRESENT, SUGGEST RECOLLECTION (A)  Final   Report Status 02/27/2021 FINAL  Final  Expectorated Sputum Assessment w Gram Stain, Rflx to Resp Cult     Status: None   Collection Time: 02/27/21  3:29 PM   Specimen: Expectorated Sputum  Result Value Ref Range Status   Specimen Description EXPECTORATED SPUTUM  Final   Special Requests Normal  Final   Sputum evaluation   Final    THIS SPECIMEN IS ACCEPTABLE FOR SPUTUM CULTURE Performed at [facility], [address] [place], [place] [address]    Report Status 02/27/2021 FINAL  Final  Culture, Respiratory w Gram Stain     Status: None (Preliminary result)   Collection Time: 02/27/21  3:29 PM  Result Value Ref Range Status   Specimen Description   Final    EXPECTORATED SPUTUM Performed at [facility], [address] [place], [place] [address]    Special Requests   Final    Normal Reflexed from [id number] Performed at [facility], [place], [place] [address]    Gram Stain   Final    RARE SQUAMOUS EPITHELIAL CELLS PRESENT RARE WBC PRESENT, PREDOMINANTLY PMN RARE GRAM POSITIVE COCCI RARE GRAM POSITIVE RODS    Culture   Final    TOO YOUNG TO READ Performed at [facility], [place] [address], [place], [place] [address]     Report Status PENDING  Incomplete     Time coordinating discharge: Over 30 minutes  SIGNED:   Ezekiel Slocumb, DO [facility] 02/28/2021, 10:49 AM   If 7PM-7AM, please contact night-coverage [email or web address]

## 2021-02-28 NOTE — Progress Notes (Signed)
[name] to be D/C'd  per MD order.  Discussed with the patient and all questions fully answered.  VSS, Skin clean, dry and intact without evidence of skin break down, no evidence of skin tears noted.  IV catheter discontinued intact. Site without signs and symptoms of complications. Dressing and pressure applied.  An After Visit Summary was printed and given to the patient. Patient received prescription.  D/c education completed with patient/family including follow up instructions, medication list, d/c activities limitations if indicated, with other d/c instructions as indicated by MD - patient able to verbalize understanding, all questions fully answered.   Patient instructed to return to ED, call 911, or call MD for any changes in condition.   Patient to be escorted via [place], and D/C home via private auto.

## 2021-02-28 NOTE — TOC Transition Note (Signed)
Transition of Care [facility]) - CM/SW Discharge Note   Patient Details  Name: AHMED INNISS MRN: [id number] Date of Birth: [birth year]  [facility] CM/SW Contact:  Shelbie Hutching, RN Phone Number: 02/28/2021, 12:46 PM   Clinical Narrative:    Patient is medically cleared for discharge home with home health through [facility] for RN and PT.  Corene Cornea with [facility] is aware of discharge today.  Patient does need oxygen but reports that he already has it at home, both he and his wife have oxygen concentrators and he has a portable.  Patient reports that he can get someone to pick him up and bring his portable with them for him to get home with.     Final next level of care: [place] Barriers to Discharge: Barriers Resolved   Patient Goals and CMS Choice Patient states their goals for this hospitalization and ongoing recovery are:: Patient wants to breath better and get back home CMS Medicare.gov Compare Post Acute Care list provided to:: Patient Choice offered to / list presented to : Patient  Discharge Placement                       Discharge Plan and Services   Discharge Planning Services: CM Consult Post Acute Care Choice: Home Health          DME Arranged: N/A DME Agency: NA       HH Arranged: RN, PT [place] Agency: [place] ([place]) Date [place]: 02/28/21 Time [place]: [place] Representative spoke with at [place]: [place] ([place]) Interventions     Readmission Risk Interventions No flowsheet data found.

## 2021-03-02 LAB — CULTURE, RESPIRATORY W GRAM STAIN: Special Requests: NORMAL

## 2021-03-05 LAB — CULTURE, BLOOD (SINGLE)
Culture: NO GROWTH
Special Requests: ADEQUATE

## 2021-03-05 LAB — CULTURE, BLOOD (ROUTINE X 2): Culture: NO GROWTH

## 2021-03-09 ENCOUNTER — Observation Stay
Admission: EM | Admit: 2021-03-09 | Discharge: 2021-03-10 | Disposition: A | Discharge status: Home / Self Care | Payer: Medicare Other | Attending: Student | Admitting: Student

## 2021-03-09 ENCOUNTER — Other Ambulatory Visit: Payer: Self-pay

## 2021-03-09 ENCOUNTER — Emergency Department: Payer: Medicare Other

## 2021-03-09 DIAGNOSIS — Z85828 Personal history of other malignant neoplasm of skin: Secondary | ICD-10-CM | POA: Insufficient documentation

## 2021-03-09 DIAGNOSIS — Z7951 Long term (current) use of inhaled steroids: Secondary | ICD-10-CM | POA: Insufficient documentation

## 2021-03-09 DIAGNOSIS — R0602 Shortness of breath: Secondary | ICD-10-CM

## 2021-03-09 DIAGNOSIS — Z20822 Contact with and (suspected) exposure to covid-19: Secondary | ICD-10-CM | POA: Insufficient documentation

## 2021-03-09 DIAGNOSIS — I10 Essential (primary) hypertension: Secondary | ICD-10-CM | POA: Diagnosis present

## 2021-03-09 DIAGNOSIS — G20 Parkinson's disease: Secondary | ICD-10-CM | POA: Insufficient documentation

## 2021-03-09 DIAGNOSIS — I4891 Unspecified atrial fibrillation: Principal | ICD-10-CM | POA: Insufficient documentation

## 2021-03-09 DIAGNOSIS — J441 Chronic obstructive pulmonary disease with (acute) exacerbation: Secondary | ICD-10-CM | POA: Insufficient documentation

## 2021-03-09 DIAGNOSIS — R002 Palpitations: Secondary | ICD-10-CM | POA: Diagnosis present

## 2021-03-09 DIAGNOSIS — Z7982 Long term (current) use of aspirin: Secondary | ICD-10-CM | POA: Insufficient documentation

## 2021-03-09 DIAGNOSIS — J449 Chronic obstructive pulmonary disease, unspecified: Secondary | ICD-10-CM

## 2021-03-09 DIAGNOSIS — Z87891 Personal history of nicotine dependence: Secondary | ICD-10-CM | POA: Insufficient documentation

## 2021-03-09 DIAGNOSIS — R079 Chest pain, unspecified: Secondary | ICD-10-CM

## 2021-03-09 LAB — T4, FREE: Free T4: 0.9 ng/dL (ref 0.61–1.12)

## 2021-03-09 LAB — CBC WITH DIFFERENTIAL/PLATELET
Abs Immature Granulocytes: 0.05 10*3/uL (ref 0.00–0.07)
Basophils Absolute: 0 10*3/uL (ref 0.0–0.1)
Basophils Relative: 1 %
Eosinophils Absolute: 0.2 10*3/uL (ref 0.0–0.5)
Eosinophils Relative: 4 %
HCT: 40.4 % (ref 39.0–52.0)
Hemoglobin: 13.6 g/dL (ref 13.0–17.0)
Immature Granulocytes: 1 %
Lymphocytes Relative: 25 %
Lymphs Abs: 1.7 10*3/uL (ref 0.7–4.0)
MCH: 33.7 pg (ref 26.0–34.0)
MCHC: 33.7 g/dL (ref 30.0–36.0)
MCV: 100.2 fL — ABNORMAL HIGH (ref 80.0–100.0)
Monocytes Absolute: 0.6 10*3/uL (ref 0.1–1.0)
Monocytes Relative: 8 %
Neutro Abs: 4.1 10*3/uL (ref 1.7–7.7)
Neutrophils Relative %: 61 %
Platelets: 194 10*3/uL (ref 150–400)
RBC: 4.03 MIL/uL — ABNORMAL LOW (ref 4.22–5.81)
RDW: 13.2 % (ref 11.5–15.5)
WBC: 6.6 10*3/uL (ref 4.0–10.5)
nRBC: 0 % (ref 0.0–0.2)

## 2021-03-09 LAB — COMPREHENSIVE METABOLIC PANEL
ALT: 38 U/L (ref 0–44)
AST: 106 U/L — ABNORMAL HIGH (ref 15–41)
Albumin: 3.5 g/dL (ref 3.5–5.0)
Alkaline Phosphatase: 59 U/L (ref 38–126)
Anion gap: 7 (ref 5–15)
BUN: 25 mg/dL — ABNORMAL HIGH (ref 8–23)
CO2: 29 mmol/L (ref 22–32)
Calcium: 9.1 mg/dL (ref 8.9–10.3)
Chloride: 107 mmol/L (ref 98–111)
Creatinine, Ser: 0.67 mg/dL (ref 0.61–1.24)
GFR, Estimated: >60 mL/min (ref >60)
Glucose, Bld: 123 mg/dL — ABNORMAL HIGH (ref 70–99)
Potassium: 4.3 mmol/L (ref 3.5–5.1)
Sodium: 143 mmol/L (ref 135–145)
Total Bilirubin: 0.8 mg/dL (ref 0.3–1.2)
Total Protein: 7.1 g/dL (ref 6.5–8.1)

## 2021-03-09 LAB — LACTIC ACID, PLASMA: Lactic Acid, Venous: 1.7 mmol/L (ref 0.5–1.9)

## 2021-03-09 LAB — RESP PANEL BY RT-PCR (FLU A&B, COVID) ARPGX2
Influenza A by PCR: NEGATIVE
Influenza B by PCR: NEGATIVE
SARS Coronavirus 2 by RT PCR: NEGATIVE

## 2021-03-09 LAB — BRAIN NATRIURETIC PEPTIDE: B Natriuretic Peptide: 45.3 pg/mL (ref 0.0–100.0)

## 2021-03-09 LAB — TROPONIN I (HIGH SENSITIVITY)
Troponin I (High Sensitivity): 6 ng/L
Troponin I (High Sensitivity): 11 ng/L (ref <18)

## 2021-03-09 LAB — TSH: TSH: 1.859 u[IU]/mL (ref 0.350–4.500)

## 2021-03-09 MED ORDER — METOPROLOL TARTRATE 25 MG PO TABS
25.0000 mg | ORAL_TABLET | Freq: Two times a day (BID) | ORAL | Status: DC
  Administered 2021-03-10: 25 mg via ORAL
  Filled 2021-03-09 (×2): qty 1

## 2021-03-09 MED ORDER — CARBIDOPA-LEVODOPA 25-100 MG PO TABS
2.0000 | ORAL_TABLET | Freq: Every day | ORAL | Status: DC
  Administered 2021-03-09 – 2021-03-10 (×6): 2 via ORAL
  Filled 2021-03-09 (×9): qty 2

## 2021-03-09 MED ORDER — ONDANSETRON HCL 4 MG/2ML IJ SOLN: 4.0000 mg | Freq: Four times a day (QID) | INTRAMUSCULAR | Status: DC | PRN

## 2021-03-09 MED ORDER — FLUTICASONE FUROATE-VILANTEROL 100-25 MCG/ACT IN AEPB
1.0000 | INHALATION_SPRAY | Freq: Every day | RESPIRATORY_TRACT | Status: DC
  Filled 2021-03-09: qty 28

## 2021-03-09 MED ORDER — ACETAMINOPHEN 325 MG PO TABS: 650.0000 mg | ORAL_TABLET | ORAL | Status: DC | PRN

## 2021-03-09 MED ORDER — FLUTICASONE-UMECLIDIN-VILANT 100-62.5-25 MCG/ACT IN AEPB: 1.0000 | INHALATION_SPRAY | Freq: Every day | RESPIRATORY_TRACT | Status: DC

## 2021-03-09 MED ORDER — APIXABAN 5 MG PO TABS
5.0000 mg | ORAL_TABLET | Freq: Two times a day (BID) | ORAL | Status: DC
  Administered 2021-03-09 – 2021-03-10 (×3): 5 mg via ORAL
  Filled 2021-03-09 (×3): qty 1

## 2021-03-09 MED ORDER — ALPRAZOLAM 0.5 MG PO TABS: 0.5000 mg | ORAL_TABLET | Freq: Every evening | ORAL | Status: DC | PRN

## 2021-03-09 MED ORDER — UMECLIDINIUM BROMIDE 62.5 MCG/ACT IN AEPB
1.0000 | INHALATION_SPRAY | Freq: Every day | RESPIRATORY_TRACT | Status: DC
  Filled 2021-03-09: qty 7

## 2021-03-09 MED ORDER — METOPROLOL TARTRATE 25 MG PO TABS
12.5000 mg | ORAL_TABLET | Freq: Two times a day (BID) | ORAL | Status: DC
  Administered 2021-03-09: 12.5 mg via ORAL
  Filled 2021-03-09: qty 1

## 2021-03-09 MED ORDER — ALBUTEROL SULFATE (2.5 MG/3ML) 0.083% IN NEBU: 2.5000 mg | INHALATION_SOLUTION | Freq: Four times a day (QID) | RESPIRATORY_TRACT | Status: DC | PRN

## 2021-03-09 MED ORDER — ENOXAPARIN SODIUM 40 MG/0.4ML IJ SOSY
40.0000 mg | PREFILLED_SYRINGE | INTRAMUSCULAR | Status: DC
  Administered 2021-03-09: 40 mg via SUBCUTANEOUS
  Filled 2021-03-09: qty 0.4

## 2021-03-09 MED ORDER — PANTOPRAZOLE SODIUM 40 MG PO TBEC
40.0000 mg | DELAYED_RELEASE_TABLET | Freq: Every day | ORAL | Status: DC
  Administered 2021-03-09 – 2021-03-10 (×2): 40 mg via ORAL
  Filled 2021-03-09 (×2): qty 1

## 2021-03-09 MED ORDER — ASPIRIN EC 81 MG PO TBEC
81.0000 mg | DELAYED_RELEASE_TABLET | Freq: Every day | ORAL | Status: DC
  Administered 2021-03-09: 81 mg via ORAL
  Filled 2021-03-09: qty 1

## 2021-03-09 NOTE — ED Notes (Signed)
Pt given water and urinal.

## 2021-03-09 NOTE — ED Notes (Signed)
Report from [facility]

## 2021-03-09 NOTE — ED Notes (Signed)
Pt given meal tray.

## 2021-03-09 NOTE — Consult Note (Addendum)
[facility] Cardiology Consultation Note  Patient ID: Patrick Montoya, MRN: [id number], DOB/AGE: 1941/01/17 80 y.o. Admit date: 03/09/2021   Date of Consult: 03/09/2021 Primary Physician: [place] Primary Cardiologist: none  Chief Complaint:  Chief Complaint  Patient presents with   Palpitations   Reason for Consult:new onset A-fib RVR  HPI: 80 y.o. male with a past medical history of COPD, hypertension, Parkinson's, BPH recently hospitalized on 10/17-10/19 with sepsis secondary to pneumonia.  Patient presented to the ER with concerns of chest pain and shortness of breath with EKG showing atrial fibrillation with rapid ventricular rate of 127 bpm.  Patient endorsed some left-sided chest pain nonradiating.  Patient states that he had checked his heart rate with his apple watch and it had stated that he was in atrial fibrillation at which time EMS found him to be in rapid atrial fibrillation and administered Cardizem 10 mg IV.  Patient states that overall he is feeling well right now with no complaints of chest pain, shortness of breath, fatigue, lower extremity edema.  Patient is heart rate improved to 73 in the ER without additional ER intervention.  Past Medical History:  Diagnosis Date   Basal cell carcinoma 07/25/2015   L zygoma inf to lat canthus   Basal cell carcinoma 06/11/2018   L nasal ala   BPH (benign prostatic hyperplasia)    COPD (chronic obstructive pulmonary disease) (HCC)    Elevated liver enzymes    Emphysema of lung (HCC)    Gastric ulcer    GERD (gastroesophageal reflux disease)    Hepatitis B    Hypertension    Parkinson's disease (HCC)    Prostate hypertrophy    Rotator cuff tear right   UTI (urinary tract infection)       Surgical History:  Past Surgical History:  Procedure Laterality Date   CHOLECYSTECTOMY     COLONOSCOPY WITH PROPOFOL N/A 10/20/2015   Procedure: COLONOSCOPY WITH PROPOFOL;  Surgeon: Manya Silvas, MD;  Location: [facility]  ENDOSCOPY;  Service: Endoscopy;  Laterality: N/A;   COLONOSCOPY WITH PROPOFOL N/A 01/24/2021   Procedure: COLONOSCOPY WITH PROPOFOL;  Surgeon: Toledo, Benay Pike, MD;  Location: [facility] ENDOSCOPY;  Service: Gastroenterology;  Laterality: N/A;   ESOPHAGOGASTRODUODENOSCOPY (EGD) WITH PROPOFOL N/A 10/20/2015   Procedure: ESOPHAGOGASTRODUODENOSCOPY (EGD) WITH PROPOFOL;  Surgeon: Manya Silvas, MD;  Location: [facility] ENDOSCOPY;  Service: Endoscopy;  Laterality: N/A;   GREEN LIGHT LASER TURP (TRANSURETHRAL RESECTION OF PROSTATE N/A 11/17/2018   Procedure: GREEN LIGHT LASER TURP (TRANSURETHRAL RESECTION OF PROSTATE;  Surgeon: Royston Cowper, MD;  Location: [facility] ORS;  Service: Urology;  Laterality: N/A;   INGUINAL HERNIA REPAIR Right 02/18/2019   Procedure: HERNIA REPAIR INGUINAL ADULT;  Surgeon: Royston Cowper, MD;  Location: [facility] ORS;  Service: Urology;  Laterality: Right;   INSERTION OF MESH Right 02/18/2019   Procedure: INSERTION OF MESH;  Surgeon: Royston Cowper, MD;  Location: [facility] ORS;  Service: Urology;  Laterality: Right;   TONSILLECTOMY     TRANSURETHRAL MICROWAVE THERAPY       Home Meds: Prior to Admission medications   Medication Sig Start Date End Date Taking? Authorizing Provider  albuterol (PROVENTIL HFA;VENTOLIN HFA) 108 (90 Base) MCG/ACT inhaler Inhale 2 puffs into the lungs every 6 (six) hours as needed for wheezing or shortness of breath.   Yes [provider]  ALPRAZolam Duanne Moron) 0.5 MG tablet Take 0.5 mg by mouth at bedtime as needed for anxiety or sleep.   Yes [provider]  aspirin 81 MG tablet Take 81 mg by mouth daily.     Yes [provider]  carbidopa-levodopa (SINEMET IR) 25-100 MG tablet Take 2 tablets by mouth 5 (five) times daily.   Yes [provider]  Cyanocobalamin ([employer] VITAMIN B-12) 2000 MCG TBCR Take 2,000 mcg by mouth daily.   Yes [provider]  doxazosin (CARDURA) 4 MG tablet Take 4 mg by mouth daily.    Yes [provider]  ferrous gluconate (FERGON) 324 MG tablet Take 324 mg by mouth daily with breakfast.   Yes [provider]  Fluticasone-Umeclidin-Vilant (TRELEGY ELLIPTA) 100-62.5-25 MCG/INH AEPB Inhale 1 puff into the lungs daily. 06/16/20  Yes [provider]  omeprazole (PRILOSEC) 40 MG capsule Take 40 mg by mouth daily.  11/06/15  Yes [provider]  potassium chloride SA (KLOR-CON) 20 MEQ tablet Take 20 mEq by mouth daily.   Yes [provider]  QUEtiapine (SEROQUEL) 50 MG tablet Take 50 mg by mouth at bedtime.   Yes [provider]  rizatriptan (MAXALT) 10 MG tablet Take 10 mg by mouth as needed for migraine. May repeat in 2 hours if needed   Yes [provider]  SUMAtriptan (IMITREX) 50 MG tablet Take 50 mg by mouth every 2 (two) hours as needed for migraine (max 2 tablets/24hrs). May repeat in 2 hours if headache persists or recurs.   Yes [provider]  tiotropium (SPIRIVA) 18 MCG inhalation capsule Place 18 mcg into inhaler and inhale daily.    [provider]    Inpatient Medications:   apixaban  5 mg Oral BID   carbidopa-levodopa  2 tablet Oral 5 X Daily   [START ON 03/10/2021] fluticasone furoate-vilanterol  1 puff Inhalation Daily   metoprolol tartrate  25 mg Oral BID   pantoprazole  40 mg Oral Daily   [START ON 03/10/2021] umeclidinium bromide  1 puff Inhalation Daily     Allergies: No Known Allergies  Social History   Socioeconomic History   Marital status: Married    Spouse name: Not on file   Number of children: Not on file   Years of education: Not on file   Highest education level: Not on file  Occupational History   Not on file  Tobacco Use   Smoking status: Former    Packs/day: 1.00    Years: 62.00    Pack years: 62.00    Types: Cigarettes   Smokeless tobacco: Never  Vaping Use   Vaping Use: Never used  Substance and Sexual Activity   Alcohol use: Yes    Comment: occasional   Drug  use: No   Sexual activity: Not on file  Other Topics Concern   Not on file  Social History Narrative   Not on file   Social Determinants of Health   Financial Resource Strain: Not on file  Food Insecurity: Not on file  Transportation Needs: Not on file  Physical Activity: Not on file  Stress: Not on file  Social Connections: Not on file  Intimate Partner Violence: Not on file     Family History  Problem Relation Age of Onset   Colon cancer Mother    Hepatitis Son    Prostate cancer Neg Hx    Kidney cancer Neg Hx      Review of Systems Positive for palpitations Negative for: General:  chills, fever, night sweats or weight changes.  Cardiovascular: PND orthopnea syncope dizziness  Dermatological skin lesions  rashes Respiratory: Cough congestion Urologic: Frequent urination urination at night and hematuria Abdominal: negative for nausea, vomiting, diarrhea, bright red blood per rectum, melena, or hematemesis Neurologic: negative for visual changes, and/or hearing changes  All other systems reviewed and are otherwise negative except as noted above.  Labs: No results for input(s): CKTOTAL, CKMB, TROPONINI in the last 72 hours. Lab Results  Component Value Date   WBC 6.6 03/09/2021   HGB 13.6 03/09/2021   HCT 40.4 03/09/2021   MCV 100.2 (H) 03/09/2021   PLT 194 03/09/2021    Recent Labs  Lab 03/09/21 0134  NA 143  K 4.3  CL 107  CO2 29  BUN 25*  CREATININE 0.67  CALCIUM 9.1  PROT 7.1  BILITOT 0.8  ALKPHOS 59  ALT 38  AST 106*  GLUCOSE 123*   No results found for: CHOL, HDL, LDLCALC, TRIG Lab Results  Component Value Date   DDIMER 0.73 (H) 02/26/2021    Radiology/Studies:  CT CHEST WO CONTRAST  Result Date: 02/20/2021 CLINICAL DATA:  Follow-up lung nodules EXAM: CT CHEST WITHOUT CONTRAST TECHNIQUE: Multidetector CT imaging of the chest was performed following the standard protocol without IV contrast. COMPARISON:  Chest CT dated January 14, 2020  FINDINGS: Cardiovascular: Normal heart size. Trace pericardial fluid. Atherosclerotic disease of the thoracic aorta. Coronary artery calcifications of the LAD. Mediastinum/Nodes: Esophagus and thyroid are unremarkable. No pathologically enlarged lymph nodes in the chest. Lungs/Pleura: Central airways are patent. Severe upper lobe predominant centrilobular emphysema. New irregular solid pulmonary nodules of the left upper lobe and left lower lobe largest located in the left lower lobe measures 2.2 x 1.4 cm on series 3, image 110. Previously seen small solid pulmonary nodules are stable. Reference nodule of the right middle lobe measuring mm on series 3, image 99. Upper Abdomen: Cholecystectomy clips.  No acute abnormality. Musculoskeletal: No chest wall mass or suspicious bone lesions identified. IMPRESSION: New irregular solid pulmonary nodules of the left upper lobe and left lower lobe, largest measures 18 mm in mean diameter, favor infectious or inflammatory process, recommend follow-up chest CT in 3 months to ensure resolution. Consider one of the following in 3 months for both low-risk and high-risk individuals: (a) repeat chest CT, (b) follow-up PET-CT, or (c) tissue sampling. This recommendation follows the consensus statement: Guidelines for Management of Incidental Pulmonary Nodules Detected on CT Images: From the Fleischner Society 2017; Radiology 2017; 284:228-243. Previously seen solid pulmonary nodules are stable. Coronary artery calcifications, aortic Atherosclerosis (ICD10-I70.0) and Emphysema (ICD10-J43.9). Electronically Signed   By: Yetta Glassman M.D.   On: 02/20/2021 16:20   CT Angio Chest Pulmonary Embolism (PE) W or WO Contrast  Result Date: 02/26/2021 CLINICAL DATA:  Respiratory distress, hypoxia and fever. EXAM: CT ANGIOGRAPHY CHEST WITH CONTRAST TECHNIQUE: Multidetector CT imaging of the chest was performed using the standard protocol during bolus administration of intravenous  contrast. Multiplanar CT image reconstructions and MIPs were obtained to evaluate the vascular anatomy. CONTRAST:  51mL OMNIPAQUE IOHEXOL 350 MG/ML SOLN COMPARISON:  None. FINDINGS: Cardiovascular: The pulmonary arteries are adequately opacified. There is no evidence of pulmonary embolism. Atherosclerosis of the thoracic aorta without evidence of aneurysmal disease. Visualized proximal great vessels are normally patent. The heart size is normal. No pericardial fluid identified. Calcified coronary artery plaque present. Mediastinum/Nodes: No enlarged mediastinal, hilar, or axillary lymph nodes. Thyroid gland, trachea, and esophagus demonstrate no significant findings. Lungs/Pleura: Dense pneumonia seen throughout much of the right lower lobe. There also is airspace disease extending  into the posterior right upper lobe and anterior right middle lobe. Stable 4 mm fissural nodule at the level of the minor fissure. No pleural fluid or pneumothorax. Stable advanced emphysematous lung disease. Upper Abdomen: No acute abnormality. Musculoskeletal: No chest wall abnormality. No acute or significant osseous findings. Review of the MIP images confirms the above findings. IMPRESSION: 1. Acute pneumonia primarily throughout the right lower lobe but also extending into the posterior right upper lobe and right middle lobe. 2. No evidence of pulmonary embolism. 3. Stable advanced emphysema. 4. Coronary atherosclerosis. Electronically Signed   By: Aletta Edouard M.D.   On: 02/26/2021 15:01   CT ABDOMEN PELVIS W CONTRAST  Result Date: 02/26/2021 CLINICAL DATA:  Respiratory distress, hypoxia and fever. Recent urinary tract infection. EXAM: CT ABDOMEN AND PELVIS WITH CONTRAST TECHNIQUE: Multidetector CT imaging of the abdomen and pelvis was performed using the standard protocol following bolus administration of intravenous contrast. CONTRAST:  47mL OMNIPAQUE IOHEXOL 350 MG/ML SOLN COMPARISON:  Prior CT of the abdomen on  08/29/2010 FINDINGS: Lower chest: Right lower lobe pneumonia. See previously dictated chest CTA report. Hepatobiliary: No focal liver abnormality is seen. Status post cholecystectomy. No biliary dilatation. Pancreas: Unremarkable. No pancreatic ductal dilatation or surrounding inflammatory changes. Spleen: Normal in size without focal abnormality. Adrenals/Urinary Tract: Adrenal glands are unremarkable. Kidneys are normal, without renal calculi, focal lesion, or hydronephrosis. Bladder is mildly distended with suggestion of mild circumferential wall thickening and irregularity. This may reflect chronic outlet obstruction or component of underlying cystitis. There is a TURP defect at the bladder base. Stomach/Bowel: Bowel shows no evidence of obstruction, ileus, inflammation or lesion. No free intraperitoneal air identified. Vascular/Lymphatic: Atherosclerosis of the abdominal aorta and iliac arteries without evidence of aneurysmal disease. No lymphadenopathy identified. 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 Central prostate calcifications. Other: No abdominal wall hernia or abnormality. No abdominopelvic ascites. Musculoskeletal: No acute or significant osseous findings. IMPRESSION: 1. No acute findings in the abdomen or pelvis. 2. Mild bladder wall irregularity and thickening which may be reflective of chronic outlet obstruction or component of underlying cystitis. 3. 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 Electronically Signed   By: Aletta Edouard M.D.   On: 02/26/2021 15:14   DG Chest Port 1 View  Result Date: 03/09/2021 CLINICAL DATA:  Left-sided chest pain. EXAM: PORTABLE CHEST 1 VIEW COMPARISON:  February 26, 2021 FINDINGS: Stable, diffuse, chronic appearing increased interstitial lung markings are seen. Mild atelectasis is noted within the bilateral lung bases. There is no evidence of a pleural effusion or pneumothorax. The heart size and mediastinal contours are within normal limits. Marked  severity calcification of the aortic arch is seen. Degenerative changes are noted throughout the thoracic spine. IMPRESSION: Chronic appearing increased interstitial lung markings with bibasilar atelectasis. Electronically Signed   By: Virgina Norfolk M.D.   On: 03/09/2021 01:49   DG Chest Port 1 View  Result Date: 02/26/2021 CLINICAL DATA:  copd, sepsis, eval infiltrate, ptx EXAM: PORTABLE CHEST 1 VIEW COMPARISON:  Radiograph 12/14/2020, chest CT 02/19/2021 FINDINGS: Unchanged cardiomediastinal silhouette. There are diffuse interstitial opacities. There are airspace opacities in the right mid lung and right lower lung. There is no large pleural effusion or visible pneumothorax. Hyperinflated lungs with changes of emphysema. There is no acute osseous abnormality. Thoracic spondylosis. IMPRESSION: Diffuse interstitial opacities with airspace disease in the right mid to lower lung. Findings could reflect asymmetric pulmonary edema and/or multifocal pneumonia. Electronically Signed   By: Maurine Simmering M.D.   On:  02/26/2021 12:21    EKG: Atrial fibrillation at a rapid ventricular rate of 127 bpm  Weights: Filed Weights   03/09/21 0115  Weight: 77.1 kg     Physical Exam: Blood pressure 139/87, pulse 70, temperature 97.9 F (36.6 C), temperature source Oral, resp. rate (!) 25, height 5\' 8"  (1.727 m), weight 77.1 kg, SpO2 96 %. Body mass index is 25.85 kg/m. General: Well developed, well nourished, in no acute distress. Head eyes ears nose throat: Normocephalic, atraumatic, sclera non-icteric, no xanthomas, nares are without discharge. No apparent thyromegaly and/or mass  Lungs: Normal respiratory effort.  no wheezes, no rales, no rhonchi.  Heart: RRR with normal S1 S2. no murmur gallop, no rub, PMI is normal size and placement, carotid upstroke normal without bruit, jugular venous pressure is normal Abdomen: Soft, non-tender, non-distended with normoactive bowel sounds. No hepatomegaly. No  rebound/guarding. No obvious abdominal masses. Abdominal aorta is normal size without bruit Extremities: No edema. no cyanosis, no clubbing, no ulcers  Peripheral : 2+ bilateral upper extremity pulses, 2+ bilateral femoral pulses, 2+ bilateral dorsal pedal pulse Neuro: Alert and oriented. No facial asymmetry. No focal deficit. Moves all extremities spontaneously. Musculoskeletal: Normal muscle tone without kyphosis Psych:  Responds to questions appropriately with a normal affect.    Assessment: 80 year old male with a past medical history of COPD, hypertension with recent admission for sepsis secondary to pneumonia found to be in atrial fibrillation with a rapid ventricular rate on arrival at the ER at 127 bpm.  Patient currently hemodynamically stable at this time.  No current evidence of acute coronary syndrome or acute heart failure.  Patient appears in normal sinus rhythm on examination today.  Plan: -Increase dose of metoprolol to 25 mg twice daily for better heart rate control and possible maintenance of normal sinus rhythm -Begin Eliquis 5 mg twice daily for stroke risk reduction with atrial fibrillation -No further cardiac diagnostic testing necessary for chest pain which resolved with adequate rate control of atrial fibrillation, negative troponins, non ischemic EKG -Patient is currently stable from a cardiovascular standpoint and ready for discharge.  Follow-up as an outpatient within 1 to 2 weeks for further evaluation and management of atrial fibrillation  Signed, Jettie Booze PA-C [facility] Cardiology 03/09/2021, 1:40 PM  The patient has been interviewed and examined. I agree with assessment and plan above. Serafina Royals MD [facility]

## 2021-03-09 NOTE — ED Notes (Signed)
Pt lunch tray removed from bedside. Pt assisted with head of bed reclined for nap. Pt denies needs at this time

## 2021-03-09 NOTE — ED Provider Notes (Signed)
[facility] Emergency Department Provider Note   ____________________________________________   Event Date/Time   First MD Initiated Contact with Patient 03/09/21 0126     (approximate)  I have reviewed the triage vital signs and the nursing notes.   HISTORY  Chief Complaint Palpitations    HPI Patrick Montoya is a 80 y.o. male brought to the ED via EMS from home with a chief complaint of chest pain, shortness of breath, new atrial fibrillation with RVR.  Found to have a heart rate in the 140s.  Given 324 mg aspirin and 10 mg IV Cardizem prior to arrival.  Patient recently hospitalized 10/17-10/19/2022 for sepsis secondary to pneumonia.  He was discharged with a new requirement for oxygen supplementation, requiring 2-3 L/min.  Denies abdominal pain, nausea, vomiting or dizziness.     Past Medical History:  Diagnosis Date   Basal cell carcinoma 07/25/2015   L zygoma inf to lat canthus   Basal cell carcinoma 06/11/2018   L nasal ala   BPH (benign prostatic hyperplasia)    COPD (chronic obstructive pulmonary disease) (HCC)    Elevated liver enzymes    Emphysema of lung (HCC)    Gastric ulcer    GERD (gastroesophageal reflux disease)    Hepatitis B    Hypertension    Parkinson's disease (HCC)    Prostate hypertrophy    Rotator cuff tear right   UTI (urinary tract infection)     Patient Active Problem List   Diagnosis Date Noted   Rapid atrial fibrillation ([place]) 03/09/2021   Chest pain    Atrial fibrillation with rapid ventricular response, new onset ([place])    Acute respiratory failure with hypoxia ([place]) 02/26/2021   Sepsis due to undetermined organism ([place]) 02/26/2021   Bacterial pneumonia 02/26/2021   Parkinson disease ([place]) 03/26/2019   Sternal fracture with retrosternal contusion, closed, initial encounter 10/31/2017   Chronic bronchitis ([place]) 05/28/2015   Gastro-esophageal reflux disease without esophagitis 05/26/2015   Insomnia,  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   COPD with acute exacerbation ([place]) 04/11/2014   Benign prostatic hypertrophy without urinary obstruction 01/21/2014   Acute bronchitis with chronic obstructive pulmonary disease (COPD) ([place]) 01/21/2014   Essential (primary) hypertension 01/21/2014   SINUSITIS- ACUTE-NOS 07/03/2007   RENAL CALCULUS 06/05/2007   HYPERTENSION 02/27/2007   COPD (chronic obstructive pulmonary disease) ([place]) 02/27/2007   GERD 02/27/2007   PEPTIC ULCER DISEASE 02/27/2007   BENIGN PROSTATIC HYPERTROPHY 02/27/2007   BURSITIS, RIGHT SHOULDER 02/27/2007   COLONIC POLYPS, HX OF 02/27/2007   FROZEN RIGHT SHOULDER 12/05/2006    Past Surgical History:  Procedure Laterality Date   CHOLECYSTECTOMY     COLONOSCOPY WITH PROPOFOL N/A 10/20/2015   Procedure: COLONOSCOPY WITH PROPOFOL;  Surgeon: Manya Silvas, MD;  Location: [facility] ENDOSCOPY;  Service: Endoscopy;  Laterality: N/A;   COLONOSCOPY WITH PROPOFOL N/A 01/24/2021   Procedure: COLONOSCOPY WITH PROPOFOL;  Surgeon: Toledo, Benay Pike, MD;  Location: [facility] ENDOSCOPY;  Service: Gastroenterology;  Laterality: N/A;   ESOPHAGOGASTRODUODENOSCOPY (EGD) WITH PROPOFOL N/A 10/20/2015   Procedure: ESOPHAGOGASTRODUODENOSCOPY (EGD) WITH PROPOFOL;  Surgeon: Manya Silvas, MD;  Location: [facility] ENDOSCOPY;  Service: Endoscopy;  Laterality: N/A;   GREEN LIGHT LASER TURP (TRANSURETHRAL RESECTION OF PROSTATE N/A 11/17/2018   Procedure: GREEN LIGHT LASER TURP (TRANSURETHRAL RESECTION OF PROSTATE;  Surgeon: Royston Cowper, MD;  Location: [facility] ORS;  Service: Urology;  Laterality: N/A;   INGUINAL HERNIA REPAIR Right 02/18/2019   Procedure: HERNIA REPAIR INGUINAL ADULT;  Surgeon: Royston Cowper,  MD;  Location: [facility] ORS;  Service: Urology;  Laterality: Right;   INSERTION OF MESH Right 02/18/2019   Procedure: INSERTION OF MESH;  Surgeon: Royston Cowper, MD;  Location: [facility] ORS;  Service: Urology;  Laterality: Right;   TONSILLECTOMY     TRANSURETHRAL MICROWAVE THERAPY       Prior to Admission medications   Medication Sig Start Date End Date Taking? Authorizing Provider  albuterol (PROVENTIL HFA;VENTOLIN HFA) 108 (90 Base) MCG/ACT inhaler Inhale 2 puffs into the lungs every 6 (six) hours as needed for wheezing or shortness of breath.   Yes [provider]  ALPRAZolam Duanne Moron) 0.5 MG tablet Take 0.5 mg by mouth at bedtime as needed for anxiety or sleep.   Yes [provider]  aspirin 81 MG tablet Take 81 mg by mouth daily.     Yes [provider]  Cyanocobalamin ([employer] VITAMIN B-12) 2000 MCG TBCR Take 2,000 mcg by mouth daily.   Yes [provider]  doxazosin (CARDURA) 4 MG tablet Take 4 mg by mouth daily.    Yes [provider]  ferrous gluconate (FERGON) 324 MG tablet Take 324 mg by mouth daily with breakfast.   Yes [provider]  Fluticasone-Umeclidin-Vilant (TRELEGY ELLIPTA) 100-62.5-25 MCG/INH AEPB Inhale 1 puff into the lungs daily. 06/16/20  Yes [provider]  omeprazole (PRILOSEC) 40 MG capsule Take 40 mg by mouth daily.  11/06/15  Yes [provider]  QUEtiapine (SEROQUEL) 50 MG tablet Take 50 mg by mouth at bedtime.   Yes [provider]  rizatriptan (MAXALT) 10 MG tablet Take 10 mg by mouth as needed for migraine. May repeat in 2 hours if needed   Yes [provider]  SUMAtriptan (IMITREX) 50 MG tablet Take 50 mg by mouth every 2 (two) hours as needed for migraine (max 2 tablets/24hrs). May repeat in 2 hours if headache persists or recurs.   Yes [provider]  tiotropium (SPIRIVA) 18 MCG inhalation capsule Place 18 mcg into inhaler and inhale daily.    [provider]    Allergies Patient has no known allergies.  Family History  Problem Relation Age of Onset   Colon cancer Mother    Hepatitis Son    Prostate cancer Neg Hx    Kidney cancer Neg Hx     Social History Social History   Tobacco Use   Smoking status: Former    Packs/day:  1.00    Years: 62.00    Pack years: 62.00    Types: Cigarettes   Smokeless tobacco: Never  Vaping Use   Vaping Use: Never used  Substance Use Topics   Alcohol use: Yes    Comment: occasional   Drug use: No    Review of Systems  Constitutional: No fever/chills Eyes: No visual changes. ENT: No sore throat. Cardiovascular: Positive for palpitations and chest pain. Respiratory: Positive for shortness of breath. Gastrointestinal: No abdominal pain.  No nausea, no vomiting.  No diarrhea.  No constipation. Genitourinary: Negative for dysuria. Musculoskeletal: Negative for back pain. Skin: Negative for rash. Neurological: Negative for headaches, focal weakness or numbness.   ____________________________________________   PHYSICAL EXAM:  VITAL SIGNS: ED Triage Vitals [03/09/21 0115]  Enc Vitals Group     BP (!) 133/113     Pulse Rate (!) 127     Resp 20     Temp 98 F (36.7 C)     Temp Source Oral     SpO2 92 %  Weight 170 lb (77.1 kg)     Height 5\' 8"  (1.727 m)     Head Circumference      Peak Flow      Pain Score 6     Pain Loc      Pain Edu?      Excl. in [place]?     Constitutional: Alert and oriented.  Elderly appearing and in mild acute distress. Eyes: Conjunctivae are normal. PERRL. EOMI. Head: Atraumatic. Nose: No congestion/rhinnorhea. Mouth/Throat: Mucous membranes are mildly dry. Neck: No stridor.   Cardiovascular: Tachycardic rate, irregular rhythm. Grossly normal heart sounds.  Good peripheral circulation. Respiratory: Increased respiratory effort.  No retractions. Lungs with scattered rhonchi. Gastrointestinal: Soft and nontender to light or deep palpation. No distention. No abdominal bruits. No CVA tenderness. Musculoskeletal: No lower extremity tenderness nor edema.  No joint effusions. Neurologic:  Normal speech and language. No gross focal neurologic deficits are appreciated.  Skin:  Skin is warm, dry and intact. No rash noted. Psychiatric: Mood  and affect are normal. Speech and behavior are normal.  ____________________________________________   LABS (all labs ordered are listed, but only abnormal results are displayed)  Labs Reviewed  CBC WITH DIFFERENTIAL/PLATELET - Abnormal; Notable for the following components:      Result Value   RBC 4.03 (*)    MCV 100.2 (*)    All other components within normal limits  COMPREHENSIVE METABOLIC PANEL - Abnormal; Notable for the following components:   Glucose, Bld 123 (*)    BUN 25 (*)    AST 106 (*)    All other components within normal limits  RESP PANEL BY RT-PCR (FLU A&B, COVID) ARPGX2  BRAIN NATRIURETIC PEPTIDE  LACTIC ACID, PLASMA  TSH  T4, FREE  TROPONIN I (HIGH SENSITIVITY)  TROPONIN I (HIGH SENSITIVITY)   ____________________________________________  EKG  ED ECG REPORT I, Yuto Cajuste J, the attending physician, personally viewed and interpreted this ECG.   Date: 03/09/2021  EKG Time: 0109  Rate: 127  Rhythm: atrial fibrillation, rate 127  Axis: LAD  Intervals:none  ST&T Change: Nonspecific  ____________________________________________  RADIOLOGY I, Harlan Vinal J, personally viewed and evaluated these images (plain radiographs) as part of my medical decision making, as well as reviewing the written report by the radiologist.  ED MD interpretation: Chronic appearing increased interstitial lung markings with bibasilar atelectasis  Official radiology report(s): DG Chest Port 1 View  Result Date: 03/09/2021 CLINICAL DATA:  Left-sided chest pain. EXAM: PORTABLE CHEST 1 VIEW COMPARISON:  February 26, 2021 FINDINGS: Stable, diffuse, chronic appearing increased interstitial lung markings are seen. Mild atelectasis is noted within the bilateral lung bases. There is no evidence of a pleural effusion or pneumothorax. The heart size and mediastinal contours are within normal limits. Marked severity calcification of the aortic arch is seen. Degenerative changes are noted  throughout the thoracic spine. IMPRESSION: Chronic appearing increased interstitial lung markings with bibasilar atelectasis. Electronically Signed   By: Virgina Norfolk M.D.   On: 03/09/2021 01:49    ____________________________________________   PROCEDURES  Procedure(s) performed (including Critical Care):  .1-3 Lead EKG Interpretation Performed by: Paulette Blanch, MD Authorized by: Paulette Blanch, MD     Interpretation: abnormal     ECG rate:  127   ECG rate assessment: tachycardic     Rhythm: atrial fibrillation     Ectopy: none     Conduction: normal   Comments:     Patient placed on cardiac monitor to evaluate for arrhythmias  ____________________________________________   INITIAL IMPRESSION / ASSESSMENT AND PLAN / ED COURSE  As part of my medical decision making, I reviewed the following data within the [place] notes reviewed and incorporated, Labs reviewed, EKG interpreted, Old chart reviewed, Radiograph reviewed, Discussed with admitting physician, and Notes from prior ED visits     80 year old male presenting with new onset atrial fibrillation, chest pain and shortness of breath, received 10 mg IV Cardizem and 325 mg aspirin prior to arrival. Differential diagnosis includes, but is not limited to, ACS, aortic dissection, pulmonary embolism, cardiac tamponade, pneumothorax, pneumonia, pericarditis, myocarditis, GI-related causes including esophagitis/gastritis, and musculoskeletal chest wall pain.     Will obtain cardiac panel, chest x-ray.  Heart rate currently controlled.  Will reassess.  Clinical Course as of 03/09/21 0454  Fri Mar 09, 2021  0315 Discussed case with hospitalist services for admission for new onset atrial fibrillation with rapid ventricular rate. [JS]    Clinical Course User Index [JS] Paulette Blanch, MD     ____________________________________________   FINAL CLINICAL IMPRESSION(S) / ED DIAGNOSES  Final diagnoses:   Atrial fibrillation with rapid ventricular response ([place])  Chest pain, unspecified type  SOB (shortness of breath)     ED Discharge Orders          Ordered    Amb referral to AFIB Clinic        03/09/21 0348             Note:  This document was prepared using Dragon voice recognition software and may include unintentional dictation errors.    Paulette Blanch, MD 03/09/21 [phone]

## 2021-03-09 NOTE — ED Triage Notes (Signed)
Pt states approx 2 hrs pta he started having left sided chest pain. Pt states recently discharged for pneumonia but had good follow up with Dr. Raul Del yesterday. Pt denies any cardiac history. EMS states ekg showed afib with RVR hr 140's. Given 324 mg asa and cardizem 10 mg IV.

## 2021-03-09 NOTE — Plan of Care (Signed)
Patient was admitted overnight due to new onset A. fib with RVR, currently patient is asymptomatic.  Cardiology was consulted, Lopressor was increased and he started on Eliquis. We will continue to monitor tonight and plan for disposition tomorrow a.m. if remains asymptomatic.

## 2021-03-09 NOTE — H&P (Signed)
History and Physical    HOBY KAWAI BTD:[id number] DOB: 03-Jul-1940 DOA: 03/09/2021  PCP: [place]   Patient coming from: home  I have personally briefly reviewed patient's old medical records in [place]  Chief Complaint: chest pain  HPI: Patrick Montoya is a 80 y.o. male with medical history significant for COPD, HTN, Parkinson's, BPH recently hospitalized from 10/17-10/19 with sepsis secondary to pneumonia who returns to the ED with chest pain and shortness of breath.  He denies cough fever or chills.  Denies nausea, vomiting or lightheadedness.  Chest pain left-sided, moderate intensity, nonradiating and felt like tightness.  Denies abdominal pain or diarrhea.  He checked his heart rate on his wife's apple watch and said he was in A. fib. EMS found him to be in rapid A. fib and administered aspirin and Cardizem 10 mg IV.  ED course: On arrival afebrile.  Heart rate 127, BP 121/83 O2 sat 98% on 2 L Blood work with troponin 6 and BNP 45.  TSH normal at 1.85.  Blood work otherwise unremarkable  EKG, personally viewed and interpreted: A. fib with RVR at 127 with no acute ST-T wave changes  Chest x-ray: Chronic interstitial disease  Heart rate improved to 73 without additional intervention in the ED.  Hospitalist consulted for admission.    Review of Systems: As per HPI otherwise all other systems on review of systems negative.    Past Medical History:  Diagnosis Date   Basal cell carcinoma 07/25/2015   L zygoma inf to lat canthus   Basal cell carcinoma 06/11/2018   L nasal ala   BPH (benign prostatic hyperplasia)    COPD (chronic obstructive pulmonary disease) (HCC)    Elevated liver enzymes    Emphysema of lung (HCC)    Gastric ulcer    GERD (gastroesophageal reflux disease)    Hepatitis B    Hypertension    Parkinson's disease (HCC)    Prostate hypertrophy    Rotator cuff tear right   UTI (urinary tract infection)     Past Surgical History:   Procedure Laterality Date   CHOLECYSTECTOMY     COLONOSCOPY WITH PROPOFOL N/A 10/20/2015   Procedure: COLONOSCOPY WITH PROPOFOL;  Surgeon: Manya Silvas, MD;  Location: [facility] ENDOSCOPY;  Service: Endoscopy;  Laterality: N/A;   COLONOSCOPY WITH PROPOFOL N/A 01/24/2021   Procedure: COLONOSCOPY WITH PROPOFOL;  Surgeon: Toledo, Benay Pike, MD;  Location: [facility] ENDOSCOPY;  Service: Gastroenterology;  Laterality: N/A;   ESOPHAGOGASTRODUODENOSCOPY (EGD) WITH PROPOFOL N/A 10/20/2015   Procedure: ESOPHAGOGASTRODUODENOSCOPY (EGD) WITH PROPOFOL;  Surgeon: Manya Silvas, MD;  Location: [facility] ENDOSCOPY;  Service: Endoscopy;  Laterality: N/A;   GREEN LIGHT LASER TURP (TRANSURETHRAL RESECTION OF PROSTATE N/A 11/17/2018   Procedure: GREEN LIGHT LASER TURP (TRANSURETHRAL RESECTION OF PROSTATE;  Surgeon: Royston Cowper, MD;  Location: [facility] ORS;  Service: Urology;  Laterality: N/A;   INGUINAL HERNIA REPAIR Right 02/18/2019   Procedure: HERNIA REPAIR INGUINAL ADULT;  Surgeon: Royston Cowper, MD;  Location: [facility] ORS;  Service: Urology;  Laterality: Right;   INSERTION OF MESH Right 02/18/2019   Procedure: INSERTION OF MESH;  Surgeon: Royston Cowper, MD;  Location: [facility] ORS;  Service: Urology;  Laterality: Right;   TONSILLECTOMY     TRANSURETHRAL MICROWAVE THERAPY       reports that he has quit smoking. His smoking use included cigarettes. He has a 62.00 pack-year smoking history. He has never used smokeless tobacco. He reports current alcohol use.  He reports that he does not use drugs.  No Known Allergies  Family History  Problem Relation Age of Onset   Colon cancer Mother    Hepatitis Son    Prostate cancer Neg Hx    Kidney cancer Neg Hx       Prior to Admission medications   Medication Sig Start Date End Date Taking? Authorizing Provider  albuterol (PROVENTIL HFA;VENTOLIN HFA) 108 (90 Base) MCG/ACT inhaler Inhale 2 puffs into the lungs every 4 (four) hours as needed for wheezing or shortness of breath.     [provider]  albuterol (PROVENTIL) (2.5 MG/3ML) 0.083% nebulizer solution Take 5 mg by nebulization at bedtime.    [provider]  ALPRAZolam Duanne Moron) 0.5 MG tablet Take 0.5 mg by mouth at bedtime.    [provider]  aspirin 81 MG tablet Take 81 mg by mouth daily.      [provider]  carbidopa-levodopa (SINEMET CR) 50-200 MG tablet Take 1 tablet by mouth 3 (three) times daily.     [provider]  Cyanocobalamin ([employer] VITAMIN B-12) 2000 MCG TBCR Take 2,000 mcg by mouth daily.    [provider]  docusate sodium (COLACE) 100 MG capsule Take 100 mg by mouth daily.    [provider]  doxazosin (CARDURA) 4 MG tablet Take 4 mg by mouth daily.     [provider]  ferrous gluconate (FERGON) 324 MG tablet Take 324 mg by mouth daily with breakfast.    [provider]  Fluticasone-Umeclidin-Vilant (TRELEGY ELLIPTA) 100-62.5-25 MCG/INH AEPB Inhale 1 Inhaler into the lungs daily. 06/16/20   [provider]  KLOR-CON M10 10 MEQ tablet Take 30 mEq by mouth daily.  10/31/15   [provider]  NUCYNTA 50 MG tablet Take 1 tablet (50 mg total) by mouth every 6 (six) hours as needed. 1 TO 2 TABS Q 6 HOURS PRN PAIN 02/18/19   Royston Cowper, MD  omeprazole (PRILOSEC) 40 MG capsule Take 40 mg by mouth daily.  11/06/15   [provider]  QUEtiapine (SEROQUEL) 50 MG tablet Take 50 mg by mouth at bedtime.    [provider]  rizatriptan (MAXALT) 10 MG tablet Take 10 mg by mouth as needed for migraine. May repeat in 2 hours if needed    [provider]  SUMAtriptan (IMITREX) 50 MG tablet Take 50 mg by mouth every 2 (two) hours as needed for migraine (max 2 tablets/24hrs). May repeat in 2 hours if headache persists or recurs.    [provider]  tiotropium (SPIRIVA) 18 MCG inhalation capsule Take 1 capsule by mouth daily.  05/31/16   [provider]    Physical  Exam: Vitals:   03/09/21 0115 03/09/21 0156 03/09/21 0231 03/09/21 0309  BP: (!) 133/113 121/83 125/77 134/86  Pulse: (!) 127 68 73 73  Resp: 20 20 20 20   Temp: 98 F (36.7 C)     TempSrc: Oral     SpO2: 92% 98% 96% 98%  Weight: 77.1 kg     Height: 5\' 8"  (1.727 m)        Vitals:   03/09/21 0115 03/09/21 0156 03/09/21 0231 03/09/21 0309  BP: (!) 133/113 121/83 125/77 134/86  Pulse: (!) 127 68 73 73  Resp: 20 20 20 20   Temp: 98 F (36.7 C)     TempSrc: Oral     SpO2: 92% 98% 96% 98%  Weight: 77.1 kg     Height: 5'  8" (1.727 m)         Constitutional: Alert and oriented x 3 . Not in any apparent distress HEENT:      Head: Normocephalic and atraumatic.         Eyes: PERLA, EOMI, Conjunctivae are normal. Sclera is non-icteric.       Mouth/Throat: Mucous membranes are moist.       Neck: Supple with no signs of meningismus. Cardiovascular: irreg irreg. No murmurs, gallops, or rubs. 2+ symmetrical distal pulses are present . No JVD. No LE edema Respiratory: Respiratory effort normal .Lungs sounds clear bilaterally. No wheezes, crackles, or rhonchi.  Gastrointestinal: Soft, non tender, and non distended with positive bowel sounds.  Genitourinary: No CVA tenderness. Musculoskeletal: Nontender with normal range of motion in all extremities. No cyanosis, or erythema of extremities. Neurologic:  Face is symmetric. Moving all extremities. No gross focal neurologic deficits . Skin: Skin is warm, dry.  No rash or ulcers Psychiatric: Mood and affect are norma    Labs on Admission: I have personally reviewed following labs and imaging studies  CBC: Recent Labs  Lab 03/09/21 0134  WBC 6.6  NEUTROABS 4.1  HGB 13.6  HCT 40.4  MCV 100.2*  PLT 027   Basic Metabolic Panel: Recent Labs  Lab 03/09/21 0134  NA 143  K 4.3  CL 107  CO2 29  GLUCOSE 123*  BUN 25*  CREATININE 0.67  CALCIUM 9.1   GFR: Estimated Creatinine Clearance: 70.1 mL/min (by C-G formula based on SCr of  0.67 mg/dL). Liver Function Tests: Recent Labs  Lab 03/09/21 0134  AST 106*  ALT 38  ALKPHOS 59  BILITOT 0.8  PROT 7.1  ALBUMIN 3.5   No results for input(s): LIPASE, AMYLASE in the last 168 hours. No results for input(s): AMMONIA in the last 168 hours. Coagulation Profile: No results for input(s): INR, PROTIME in the last 168 hours. Cardiac Enzymes: No results for input(s): CKTOTAL, CKMB, CKMBINDEX, TROPONINI in the last 168 hours. BNP (last 3 results) No results for input(s): PROBNP in the last 8760 hours. HbA1C: No results for input(s): HGBA1C in the last 72 hours. CBG: No results for input(s): GLUCAP in the last 168 hours. Lipid Profile: No results for input(s): CHOL, HDL, LDLCALC, TRIG, CHOLHDL, LDLDIRECT in the last 72 hours. Thyroid Function Tests: Recent Labs    03/09/21 0134  TSH 1.859  FREET4 0.90   Anemia Panel: No results for input(s): VITAMINB12, FOLATE, FERRITIN, TIBC, IRON, RETICCTPCT in the last 72 hours. Urine analysis:    Component Value Date/Time   COLORURINE RED (A) 02/26/2021 1130   APPEARANCEUR CLEAR (A) 02/26/2021 1130   APPEARANCEUR Clear 10/26/2012 1721   LABSPEC 1.023 02/26/2021 1130   LABSPEC 1.021 10/26/2012 1721   PHURINE  02/26/2021 1130    TEST NOT REPORTED DUE TO COLOR INTERFERENCE OF URINE PIGMENT   GLUCOSEU (A) 02/26/2021 1130    TEST NOT REPORTED DUE TO COLOR INTERFERENCE OF URINE PIGMENT   GLUCOSEU Negative 10/26/2012 1721   HGBUR (A) 02/26/2021 1130    TEST NOT REPORTED DUE TO COLOR INTERFERENCE OF URINE PIGMENT   HGBUR negative 01/01/2008 0000   BILIRUBINUR (A) 02/26/2021 1130    TEST NOT REPORTED DUE TO COLOR INTERFERENCE OF URINE PIGMENT   BILIRUBINUR Negative 10/26/2012 1721   KETONESUR (A) 02/26/2021 1130    TEST NOT REPORTED DUE TO COLOR INTERFERENCE OF URINE PIGMENT   PROTEINUR (A) 02/26/2021 1130    TEST NOT REPORTED DUE TO COLOR INTERFERENCE OF  URINE PIGMENT   UROBILINOGEN 1.0 08/29/2010 0041   NITRITE (A)  02/26/2021 1130    TEST NOT REPORTED DUE TO COLOR INTERFERENCE OF URINE PIGMENT   LEUKOCYTESUR (A) 02/26/2021 1130    TEST NOT REPORTED DUE TO COLOR INTERFERENCE OF URINE PIGMENT   LEUKOCYTESUR 3+ 10/26/2012 1721    Radiological Exams on Admission: DG Chest Port 1 View  Result Date: 03/09/2021 CLINICAL DATA:  Left-sided chest pain. EXAM: PORTABLE CHEST 1 VIEW COMPARISON:  February 26, 2021 FINDINGS: Stable, diffuse, chronic appearing increased interstitial lung markings are seen. Mild atelectasis is noted within the bilateral lung bases. There is no evidence of a pleural effusion or pneumothorax. The heart size and mediastinal contours are within normal limits. Marked severity calcification of the aortic arch is seen. Degenerative changes are noted throughout the thoracic spine. IMPRESSION: Chronic appearing increased interstitial lung markings with bibasilar atelectasis. Electronically Signed   By: Virgina Norfolk M.D.   On: 03/09/2021 01:49     Assessment/Plan    Atrial fibrillation with rapid ventricular response, new onset in the setting of recent pneumonia 10/17-10/19 - EKG with rapid A. fib at 127, controlled with single diltiazem bolus administered by EMS - Metoprolol - CHA2DS2-VASc score of 3 so will benefit from systemic anticoagulation for stroke prevention - Echocardiogram - Cardiology consult    Chest pain - Likely secondary to rapid A. fib.  First troponin negative and EKG nonacute - Chest pain resolved with rate control - Continue to monitor and trend troponins -Echo to evaluate for focal wall motion abnormality    COPD (chronic obstructive pulmonary disease) ([place]) - As needed DuoNeb    Essential (primary) hypertension - We will hold antihypertensives as patient on Cardizem and is normotensive  Parkinson's disease - Continue Sinemet  BPH - Continue Cardura    DVT prophylaxis: Lovenox  Code Status: full code  Family Communication:  none  Disposition Plan:  Back to previous home environment Consults called: cardiology Status:At the time of admission, it appears that the appropriate admission status for this patient is INPATIENT. This is judged to be reasonable and necessary in order to provide the required intensity of service to ensure the patient's safety given the presenting symptoms, physical exam findings, and initial radiographic and laboratory data in the context of their  Comorbid conditions.   Patient requires inpatient status due to high intensity of service, high risk for further deterioration and high frequency of surveillance required.   I certify that at the point of admission it is my clinical judgment that the patient will require inpatient hospital care spanning beyond [place] MD [facility]     03/09/2021, 3:31 AM

## 2021-03-09 NOTE — ED Notes (Signed)
Pt assisted to the bathroom.

## 2021-03-10 ENCOUNTER — Inpatient Hospital Stay
Admit: 2021-03-10 | Discharge: 2021-03-10 | Disposition: A | Discharge status: Home / Self Care | Payer: Medicare Other | Attending: Student | Admitting: Student

## 2021-03-10 DIAGNOSIS — I4891 Unspecified atrial fibrillation: Secondary | ICD-10-CM | POA: Diagnosis not present

## 2021-03-10 LAB — MAGNESIUM: Magnesium: 2 mg/dL (ref 1.7–2.4)

## 2021-03-10 LAB — CBC
HCT: 39.3 % (ref 39.0–52.0)
Hemoglobin: 13.3 g/dL (ref 13.0–17.0)
MCH: 33.4 pg (ref 26.0–34.0)
MCHC: 33.8 g/dL (ref 30.0–36.0)
MCV: 98.7 fL (ref 80.0–100.0)
Platelets: 189 10*3/uL (ref 150–400)
RBC: 3.98 MIL/uL — ABNORMAL LOW (ref 4.22–5.81)
RDW: 13 % (ref 11.5–15.5)
WBC: 7 10*3/uL (ref 4.0–10.5)
nRBC: 0 % (ref 0.0–0.2)

## 2021-03-10 LAB — BASIC METABOLIC PANEL
Anion gap: 6 (ref 5–15)
BUN: 16 mg/dL (ref 8–23)
CO2: 28 mmol/L (ref 22–32)
Calcium: 8.6 mg/dL — ABNORMAL LOW (ref 8.9–10.3)
Chloride: 105 mmol/L (ref 98–111)
Creatinine, Ser: 0.82 mg/dL (ref 0.61–1.24)
GFR, Estimated: >60 mL/min (ref >60)
Glucose, Bld: 91 mg/dL (ref 70–99)
Potassium: 3.7 mmol/L (ref 3.5–5.1)
Sodium: 139 mmol/L (ref 135–145)

## 2021-03-10 LAB — PHOSPHORUS: Phosphorus: 3 mg/dL (ref 2.5–4.6)

## 2021-03-10 MED ORDER — APIXABAN 5 MG PO TABS: 5.0000 mg | ORAL_TABLET | Freq: Two times a day (BID) | ORAL | 3 refills | Status: DC

## 2021-03-10 MED ORDER — METOPROLOL TARTRATE 25 MG PO TABS: 25.0000 mg | ORAL_TABLET | Freq: Two times a day (BID) | ORAL | 0 refills | Status: DC

## 2021-03-10 NOTE — Progress Notes (Signed)
*  PRELIMINARY RESULTS* Echocardiogram 2D Echocardiogram has been performed.  Patrick Montoya 03/10/2021, 9:48 AM

## 2021-03-10 NOTE — Discharge Summary (Signed)
[facility] Discharge Summary   Patient: Patrick Montoya QIH:[id number]  PCP: [place]  Date of admission: 03/09/2021   Date of discharge:  03/10/2021     Discharge Diagnoses:  Principal diagnosis A. fib with RVR, new onset Active Problems:   COPD (chronic obstructive pulmonary disease) ([place])   Essential (primary) hypertension   Chest pain   Atrial fibrillation with rapid ventricular response, new onset ([place])   Rapid atrial fibrillation ([place])   Admitted From: Home Disposition:  Home   Recommendations for Outpatient Follow-up:  PCP: in 1 wk Cardiology in 1 wk Follow up LABS/TEST:     Follow-up Information     Corey Skains, MD. Schedule an appointment as soon as possible for a visit in 1 week(s).   Specialty: Cardiology Contact information: [address] [facility] [place] [address] [phone]                Diet recommendation: Cardiac diet  Activity: The patient is advised to gradually reintroduce usual activities, as tolerated  Discharge Condition: stable  Code Status: Full code   History of present illness: As per the H and P dictated on admission Hospital Course:  Patrick Montoya is a 80 y.o. male with medical history significant for COPD, HTN, Parkinson's, BPH recently hospitalized from 10/17-10/19 with sepsis secondary to pneumonia who returns to the ED with chest pain and shortness of breath.  He denies cough fever or chills.  Denies nausea, vomiting or lightheadedness.  Chest pain left-sided, moderate intensity, nonradiating and felt like tightness.  Denies abdominal pain or diarrhea.  He checked his heart rate on his wife's apple watch and said he was in A. fib. EMS found him to be in rapid A. fib and administered aspirin and Cardizem 10 mg IV. ED course: On arrival afebrile.  Heart rate 127, BP 121/83 O2 sat 98% on 2 L Blood work with troponin 6 and BNP 45.  TSH normal at 1.85.  Blood work  otherwise unremarkable EKG, personally viewed and interpreted: A. fib with RVR at 127 with no acute ST-T wave changes Chest x-ray: Chronic interstitial disease Heart rate improved to 73 without additional intervention in the ED.  Hospitalist consulted for admission.  Assessment and plan # Atrial fibrillation with rapid ventricular response, new onset in the setting of recent pneumonia 10/17-10/19 EKG with rapid A. fib at 127, controlled with single diltiazem bolus administered by EMS Started Metoprolol 25 mg p.o. twice daily, started Eliquis 5 mg p.o. twice daily for stroke prophylaxis.  Patient has CHA2DS2-VASc score of 3, so will benefit from systemic anticoagulation for stroke prevention.  Echocardiogram was done but report is still pending.  Patient was seen by cardiology and recommended to discharge home and follow-up in the clinic next week.  # Chest pain,  Likely secondary to rapid A. fib.  First troponin negative and EKG nonacute. Chest pain resolved with rate control.  Seen by cardiology and cleared for discharge to follow-up as an outpatient.  No ischemic work-up at this time. # COPD (chronic obstructive pulmonary disease) resumed inhalers from home.  # Essential (primary) hypertension, started metoprolol 25 P.o. twice daily, patient was advised to monitor BP at home and follow with PCP and cardiology for further management. # Parkinson's disease Continue Sinemet # BPH, Continue Cardura Body mass index is 25.85 kg/m.  Nutrition Interventions:  Patient was ambulatory without any assistance. On the day of the discharge the patient's vitals were stable, and no  other acute medical condition were reported by patient. the patient was felt safe to be discharge at Home   Consultants: Cardiology Procedures: None  Discharge Exam: General: Appear in no distress, no Rash; Oral Mucosa Clear, moist. Cardiovascular: S1 and S2 Present, nno Murmur, Respiratory: normal respiratory effort,  Bilateral Air entry present and no Crackles, no wheezes Abdomen: Bowel Sound present, Soft and non tenderness, no hernia Extremities: no Pedal edema, no calf tenderness Neurology: alert and oriented to time, place, and person affect appropriate.  Filed Weights   03/09/21 0115  Weight: 77.1 kg   Vitals:   03/10/21 1100 03/10/21 1151  BP:  134/79  Pulse: 67 69  Resp: 18   Temp:    SpO2: 96%     DISCHARGE MEDICATION: Allergies as of 03/10/2021   No Known Allergies      Medication List     TAKE these medications    albuterol 108 (90 Base) MCG/ACT inhaler Commonly known as: VENTOLIN HFA Inhale 2 puffs into the lungs every 6 (six) hours as needed for wheezing or shortness of breath.   ALPRAZolam 0.5 MG tablet Commonly known as: XANAX Take 0.5 mg by mouth at bedtime as needed for anxiety or sleep.   apixaban 5 MG Tabs tablet Commonly known as: ELIQUIS Take 1 tablet (5 mg total) by mouth 2 (two) times daily.   aspirin 81 MG tablet Take 81 mg by mouth daily.   carbidopa-levodopa 25-100 MG tablet Commonly known as: SINEMET IR Take 2 tablets by mouth 5 (five) times daily.   [employer] Vitamin B-12 2000 MCG Tbcr Generic drug: Cyanocobalamin Take 2,000 mcg by mouth daily.   doxazosin 4 MG tablet Commonly known as: CARDURA Take 4 mg by mouth daily.   ferrous gluconate 324 MG tablet Commonly known as: FERGON Take 324 mg by mouth daily with breakfast.   metoprolol tartrate 25 MG tablet Commonly known as: LOPRESSOR Take 1 tablet (25 mg total) by mouth 2 (two) times daily.   omeprazole 40 MG capsule Commonly known as: PRILOSEC Take 40 mg by mouth daily.   potassium chloride SA 20 MEQ tablet Commonly known as: KLOR-CON Take 20 mEq by mouth daily.   QUEtiapine 50 MG tablet Commonly known as: SEROQUEL Take 50 mg by mouth at bedtime.   rizatriptan 10 MG tablet Commonly known as: MAXALT Take 10 mg by mouth as needed for migraine. May repeat in 2 hours if needed    SUMAtriptan 50 MG tablet Commonly known as: IMITREX Take 50 mg by mouth every 2 (two) hours as needed for migraine (max 2 tablets/24hrs). May repeat in 2 hours if headache persists or recurs.   tiotropium 18 MCG inhalation capsule Commonly known as: SPIRIVA Place 18 mcg into inhaler and inhale daily.   Trelegy Ellipta 100-62.5-25 MCG/ACT Aepb Generic drug: Fluticasone-Umeclidin-Vilant Inhale 1 puff into the lungs daily.       No Known Allergies Discharge Instructions     Amb referral to AFIB Clinic   Complete by: As directed    Call MD for:  difficulty breathing, headache or visual disturbances   Complete by: As directed    Call MD for:  extreme fatigue   Complete by: As directed    Call MD for:  persistant dizziness or light-headedness   Complete by: As directed    Call MD for:  persistant nausea and vomiting   Complete by: As directed    Call MD for:  severe uncontrolled pain   Complete by: As directed  Call MD for:  temperature >100.4   Complete by: As directed    Diet - low sodium heart healthy   Complete by: As directed    Discharge instructions   Complete by: As directed    Follow-up with PCP in 1 week, continue monitor BP at home and follow with PCP to titrate medication accordingly. Follow with cardiology in 1 to 2 weeks.   Increase activity slowly   Complete by: As directed        The results of significant diagnostics from this hospitalization (including imaging, microbiology, ancillary and laboratory) are listed below for reference.    Significant Diagnostic Studies: CT CHEST WO CONTRAST  Result Date: 02/20/2021 CLINICAL DATA:  Follow-up lung nodules EXAM: CT CHEST WITHOUT CONTRAST TECHNIQUE: Multidetector CT imaging of the chest was performed following the standard protocol without IV contrast. COMPARISON:  Chest CT dated January 14, 2020 FINDINGS: Cardiovascular: Normal heart size. Trace pericardial fluid. Atherosclerotic disease of the thoracic  aorta. Coronary artery calcifications of the LAD. Mediastinum/Nodes: Esophagus and thyroid are unremarkable. No pathologically enlarged lymph nodes in the chest. Lungs/Pleura: Central airways are patent. Severe upper lobe predominant centrilobular emphysema. New irregular solid pulmonary nodules of the left upper lobe and left lower lobe largest located in the left lower lobe measures 2.2 x 1.4 cm on series 3, image 110. Previously seen small solid pulmonary nodules are stable. Reference nodule of the right middle lobe measuring mm on series 3, image 99. Upper Abdomen: Cholecystectomy clips.  No acute abnormality. Musculoskeletal: No chest wall mass or suspicious bone lesions identified. IMPRESSION: New irregular solid pulmonary nodules of the left upper lobe and left lower lobe, largest measures 18 mm in mean diameter, favor infectious or inflammatory process, recommend follow-up chest CT in 3 months to ensure resolution. Consider one of the following in 3 months for both low-risk and high-risk individuals: (a) repeat chest CT, (b) follow-up PET-CT, or (c) tissue sampling. This recommendation follows the consensus statement: Guidelines for Management of Incidental Pulmonary Nodules Detected on CT Images: From the Fleischner Society 2017; Radiology 2017; 284:228-243. Previously seen solid pulmonary nodules are stable. Coronary artery calcifications, aortic Atherosclerosis (ICD10-I70.0) and Emphysema (ICD10-J43.9). Electronically Signed   By: Yetta Glassman M.D.   On: 02/20/2021 16:20   CT Angio Chest Pulmonary Embolism (PE) W or WO Contrast  Result Date: 02/26/2021 CLINICAL DATA:  Respiratory distress, hypoxia and fever. EXAM: CT ANGIOGRAPHY CHEST WITH CONTRAST TECHNIQUE: Multidetector CT imaging of the chest was performed using the standard protocol during bolus administration of intravenous contrast. Multiplanar CT image reconstructions and MIPs were obtained to evaluate the vascular anatomy. CONTRAST:   65mL OMNIPAQUE IOHEXOL 350 MG/ML SOLN COMPARISON:  None. FINDINGS: Cardiovascular: The pulmonary arteries are adequately opacified. There is no evidence of pulmonary embolism. Atherosclerosis of the thoracic aorta without evidence of aneurysmal disease. Visualized proximal great vessels are normally patent. The heart size is normal. No pericardial fluid identified. Calcified coronary artery plaque present. Mediastinum/Nodes: No enlarged mediastinal, hilar, or axillary lymph nodes. Thyroid gland, trachea, and esophagus demonstrate no significant findings. Lungs/Pleura: Dense pneumonia seen throughout much of the right lower lobe. There also is airspace disease extending into the posterior right upper lobe and anterior right middle lobe. Stable 4 mm fissural nodule at the level of the minor fissure. No pleural fluid or pneumothorax. Stable advanced emphysematous lung disease. Upper Abdomen: No acute abnormality. Musculoskeletal: No chest wall abnormality. No acute or significant osseous findings. Review of the MIP images confirms the above  findings. IMPRESSION: 1. Acute pneumonia primarily throughout the right lower lobe but also extending into the posterior right upper lobe and right middle lobe. 2. No evidence of pulmonary embolism. 3. Stable advanced emphysema. 4. Coronary atherosclerosis. Electronically Signed   By: Aletta Edouard M.D.   On: 02/26/2021 15:01   CT ABDOMEN PELVIS W CONTRAST  Result Date: 02/26/2021 CLINICAL DATA:  Respiratory distress, hypoxia and fever. Recent urinary tract infection. EXAM: CT ABDOMEN AND PELVIS WITH CONTRAST TECHNIQUE: Multidetector CT imaging of the abdomen and pelvis was performed using the standard protocol following bolus administration of intravenous contrast. CONTRAST:  75mL OMNIPAQUE IOHEXOL 350 MG/ML SOLN COMPARISON:  Prior CT of the abdomen on 08/29/2010 FINDINGS: Lower chest: Right lower lobe pneumonia. See previously dictated chest CTA report. Hepatobiliary: No  focal liver abnormality is seen. Status post cholecystectomy. No biliary dilatation. Pancreas: Unremarkable. No pancreatic ductal dilatation or surrounding inflammatory changes. Spleen: Normal in size without focal abnormality. Adrenals/Urinary Tract: Adrenal glands are unremarkable. Kidneys are normal, without renal calculi, focal lesion, or hydronephrosis. Bladder is mildly distended with suggestion of mild circumferential wall thickening and irregularity. This may reflect chronic outlet obstruction or component of underlying cystitis. There is a TURP defect at the bladder base. Stomach/Bowel: Bowel shows no evidence of obstruction, ileus, inflammation or lesion. No free intraperitoneal air identified. Vascular/Lymphatic: Atherosclerosis of the abdominal aorta and iliac arteries without evidence of aneurysmal disease. No lymphadenopathy identified. 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 Central prostate calcifications. Other: No abdominal wall hernia or abnormality. No abdominopelvic ascites. Musculoskeletal: No acute or significant osseous findings. IMPRESSION: 1. No acute findings in the abdomen or pelvis. 2. Mild bladder wall irregularity and thickening which may be reflective of chronic outlet obstruction or component of underlying cystitis. 3. 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 Electronically Signed   By: Aletta Edouard M.D.   On: 02/26/2021 15:14   DG Chest Port 1 View  Result Date: 03/09/2021 CLINICAL DATA:  Left-sided chest pain. EXAM: PORTABLE CHEST 1 VIEW COMPARISON:  February 26, 2021 FINDINGS: Stable, diffuse, chronic appearing increased interstitial lung markings are seen. Mild atelectasis is noted within the bilateral lung bases. There is no evidence of a pleural effusion or pneumothorax. The heart size and mediastinal contours are within normal limits. Marked severity calcification of the aortic arch is seen. Degenerative changes are noted throughout the thoracic spine. IMPRESSION:  Chronic appearing increased interstitial lung markings with bibasilar atelectasis. Electronically Signed   By: Virgina Norfolk M.D.   On: 03/09/2021 01:49   DG Chest Port 1 View  Result Date: 02/26/2021 CLINICAL DATA:  copd, sepsis, eval infiltrate, ptx EXAM: PORTABLE CHEST 1 VIEW COMPARISON:  Radiograph 12/14/2020, chest CT 02/19/2021 FINDINGS: Unchanged cardiomediastinal silhouette. There are diffuse interstitial opacities. There are airspace opacities in the right mid lung and right lower lung. There is no large pleural effusion or visible pneumothorax. Hyperinflated lungs with changes of emphysema. There is no acute osseous abnormality. Thoracic spondylosis. IMPRESSION: Diffuse interstitial opacities with airspace disease in the right mid to lower lung. Findings could reflect asymmetric pulmonary edema and/or multifocal pneumonia. Electronically Signed   By: Maurine Simmering M.D.   On: 02/26/2021 12:21    Microbiology: Recent Results (from the past 240 hour(s))  Resp Panel by RT-PCR (Flu A&B, Covid) Nasopharyngeal Swab     Status: None   Collection Time: 03/09/21  1:34 AM   Specimen: Nasopharyngeal Swab; Nasopharyngeal(NP) swabs in vial transport medium  Result Value Ref Range Status   SARS  Coronavirus 2 by RT PCR NEGATIVE NEGATIVE Final    Comment: (NOTE) SARS-CoV-2 target nucleic acids are NOT DETECTED.  The SARS-CoV-2 RNA is generally detectable in upper respiratory specimens during the acute phase of infection. The lowest concentration of SARS-CoV-2 viral copies this assay can detect is 138 copies/mL. A negative result does not preclude SARS-Cov-2 infection and should not be used as the sole basis for treatment or other patient management decisions. A negative result may occur with  improper specimen collection/handling, submission of specimen other than nasopharyngeal swab, presence of viral mutation(s) within the areas targeted by this assay, and inadequate number of  viral copies(<138 copies/mL). A negative result must be combined with clinical observations, patient history, and epidemiological information. The expected result is Negative.  Fact Sheet for Patients:  EntrepreneurPulse.com.au  Fact Sheet for Healthcare Providers:  IncredibleEmployment.be  This test is no t yet approved or cleared by the [place] FDA and  has been authorized for detection and/or diagnosis of SARS-CoV-2 by FDA under an Emergency Use Authorization (EUA). This EUA will remain  in effect (meaning this test can be used) for the duration of the COVID-19 declaration under Section 564(b)(1) of the Act, 21 U.S.C.section 360bbb-3(b)(1), unless the authorization is terminated  or revoked sooner.       Influenza A by PCR NEGATIVE NEGATIVE Final   Influenza B by PCR NEGATIVE NEGATIVE Final    Comment: (NOTE) The Xpert Xpress SARS-CoV-2/FLU/RSV plus assay is intended as an aid in the diagnosis of influenza from Nasopharyngeal swab specimens and should not be used as a sole basis for treatment. Nasal washings and aspirates are unacceptable for Xpert Xpress SARS-CoV-2/FLU/RSV testing.  Fact Sheet for Patients: EntrepreneurPulse.com.au  Fact Sheet for Healthcare Providers: IncredibleEmployment.be  This test is not yet approved or cleared by the [place] FDA and has been authorized for detection and/or diagnosis of SARS-CoV-2 by FDA under an Emergency Use Authorization (EUA). This EUA will remain in effect (meaning this test can be used) for the duration of the COVID-19 declaration under Section 564(b)(1) of the Act, 21 U.S.C. section 360bbb-3(b)(1), unless the authorization is terminated or revoked.  Performed at [facility], [address] [place], [place] [address]      Labs: CBC: Recent Labs  Lab 03/09/21 0134 03/10/21 0704  WBC 6.6 7.0  NEUTROABS 4.1  --   HGB 13.6  13.3  HCT 40.4 39.3  MCV 100.2* 98.7  PLT 194 735   Basic Metabolic Panel: Recent Labs  Lab 03/09/21 0134 03/10/21 0704  NA 143 139  K 4.3 3.7  CL 107 105  CO2 29 28  GLUCOSE 123* 91  BUN 25* 16  CREATININE 0.67 0.82  CALCIUM 9.1 8.6*  MG  --  2.0  PHOS  --  3.0   Liver Function Tests: Recent Labs  Lab 03/09/21 0134  AST 106*  ALT 38  ALKPHOS 59  BILITOT 0.8  PROT 7.1  ALBUMIN 3.5   No results for input(s): LIPASE, AMYLASE in the last 168 hours. No results for input(s): AMMONIA in the last 168 hours. Cardiac Enzymes: No results for input(s): CKTOTAL, CKMB, CKMBINDEX, TROPONINI in the last 168 hours. BNP (last 3 results) Recent Labs    03/09/21 0134  BNP 45.3   CBG: No results for input(s): GLUCAP in the last 168 hours.  Time spent: 35 minutes  Signed:  Val Riles  [facility]  03/10/2021 1:17 PM

## 2021-03-10 NOTE — Care Management Obs Status (Signed)
[place] NOTIFICATION   Patient Details  Name: Patrick Montoya MRN: [id number] Date of Birth: 06/07/1940   Medicare Observation Status Notification Given:       Kerin Salen, RN 03/10/2021, 1:18 PM

## 2021-03-10 NOTE — Progress Notes (Signed)
[facility] Encounter Note  Patient: OSHEA PERCIVAL / Admit Date: 03/09/2021 / Date of Encounter: 03/10/2021, 6:49 AM   Subjective: Patient has tolerated medication management including low-dose beta-blocker for maintenance of normal sinus rhythm overnight without evidence of hypotension and/or bradycardia.  Patient remains in normal sinus rhythm at this time and feeling well.  There has been no other significant issues or heart failure or concerns for acute coronary syndrome since hospitalization.  Review of Systems: Positive for: None Negative for: Vision change, hearing change, syncope, dizziness, nausea, vomiting,diarrhea, bloody stool, stomach pain, cough, congestion, diaphoresis, urinary frequency, urinary pain,skin lesions, skin rashes Others previously listed  Objective: Telemetry: Normal sinus rhythm Physical Exam: Blood pressure 137/84, pulse 69, temperature 97.9 F (36.6 C), temperature source Oral, resp. rate 18, height 5\' 8"  (1.727 m), weight 77.1 kg, SpO2 95 %. Body mass index is 25.85 kg/m. General: Well developed, well nourished, in no acute distress. Head: Normocephalic, atraumatic, sclera non-icteric, no xanthomas, nares are without discharge. Neck: No apparent masses Lungs: Normal respirations with no wheezes, no rhonchi, no rales , no crackles   Heart: Regular rate and rhythm, normal S1 S2, no murmur, no rub, no gallop, PMI is normal size and placement, carotid upstroke normal without bruit, jugular venous pressure normal Abdomen: Soft, non-tender, non-distended with normoactive bowel sounds. No hepatosplenomegaly. Abdominal aorta is normal size without bruit Extremities: No edema, no clubbing, no cyanosis, no ulcers,  Peripheral: 2+ radial, 2+ femoral, 2+ dorsal pedal pulses Neuro: Alert and oriented. Moves all extremities spontaneously. Psych:  Responds to questions appropriately with a normal affect.  No intake or output data in the 24 hours  ending 03/10/21 0649  Inpatient Medications:   apixaban  5 mg Oral BID   carbidopa-levodopa  2 tablet Oral 5 X Daily   fluticasone furoate-vilanterol  1 puff Inhalation Daily   metoprolol tartrate  25 mg Oral BID   pantoprazole  40 mg Oral Daily   umeclidinium bromide  1 puff Inhalation Daily   Infusions:   Labs: Recent Labs    03/09/21 0134  NA 143  K 4.3  CL 107  CO2 29  GLUCOSE 123*  BUN 25*  CREATININE 0.67  CALCIUM 9.1   Recent Labs    03/09/21 0134  AST 106*  ALT 38  ALKPHOS 59  BILITOT 0.8  PROT 7.1  ALBUMIN 3.5   Recent Labs    03/09/21 0134  WBC 6.6  NEUTROABS 4.1  HGB 13.6  HCT 40.4  MCV 100.2*  PLT 194   No results for input(s): CKTOTAL, CKMB, TROPONINI in the last 72 hours. Invalid input(s): POCBNP No results for input(s): HGBA1C in the last 72 hours.   Weights: Filed Weights   03/09/21 0115  Weight: 77.1 kg     Radiology/Studies:  CT CHEST WO CONTRAST  Result Date: 02/20/2021 CLINICAL DATA:  Follow-up lung nodules EXAM: CT CHEST WITHOUT CONTRAST TECHNIQUE: Multidetector CT imaging of the chest was performed following the standard protocol without IV contrast. COMPARISON:  Chest CT dated January 14, 2020 FINDINGS: Cardiovascular: Normal heart size. Trace pericardial fluid. Atherosclerotic disease of the thoracic aorta. Coronary artery calcifications of the LAD. Mediastinum/Nodes: Esophagus and thyroid are unremarkable. No pathologically enlarged lymph nodes in the chest. Lungs/Pleura: Central airways are patent. Severe upper lobe predominant centrilobular emphysema. New irregular solid pulmonary nodules of the left upper lobe and left lower lobe largest located in the left lower lobe measures 2.2 x 1.4 cm on series 3, image  110. Previously seen small solid pulmonary nodules are stable. Reference nodule of the right middle lobe measuring mm on series 3, image 99. Upper Abdomen: Cholecystectomy clips.  No acute abnormality. Musculoskeletal: No  chest wall mass or suspicious bone lesions identified. IMPRESSION: New irregular solid pulmonary nodules of the left upper lobe and left lower lobe, largest measures 18 mm in mean diameter, favor infectious or inflammatory process, recommend follow-up chest CT in 3 months to ensure resolution. Consider one of the following in 3 months for both low-risk and high-risk individuals: (a) repeat chest CT, (b) follow-up PET-CT, or (c) tissue sampling. This recommendation follows the consensus statement: Guidelines for Management of Incidental Pulmonary Nodules Detected on CT Images: From the Fleischner Society 2017; Radiology 2017; 284:228-243. Previously seen solid pulmonary nodules are stable. Coronary artery calcifications, aortic Atherosclerosis (ICD10-I70.0) and Emphysema (ICD10-J43.9). Electronically Signed   By: Yetta Glassman M.D.   On: 02/20/2021 16:20   CT Angio Chest Pulmonary Embolism (PE) W or WO Contrast  Result Date: 02/26/2021 CLINICAL DATA:  Respiratory distress, hypoxia and fever. EXAM: CT ANGIOGRAPHY CHEST WITH CONTRAST TECHNIQUE: Multidetector CT imaging of the chest was performed using the standard protocol during bolus administration of intravenous contrast. Multiplanar CT image reconstructions and MIPs were obtained to evaluate the vascular anatomy. CONTRAST:  8mL OMNIPAQUE IOHEXOL 350 MG/ML SOLN COMPARISON:  None. FINDINGS: Cardiovascular: The pulmonary arteries are adequately opacified. There is no evidence of pulmonary embolism. Atherosclerosis of the thoracic aorta without evidence of aneurysmal disease. Visualized proximal great vessels are normally patent. The heart size is normal. No pericardial fluid identified. Calcified coronary artery plaque present. Mediastinum/Nodes: No enlarged mediastinal, hilar, or axillary lymph nodes. Thyroid gland, trachea, and esophagus demonstrate no significant findings. Lungs/Pleura: Dense pneumonia seen throughout much of the right lower lobe. There  also is airspace disease extending into the posterior right upper lobe and anterior right middle lobe. Stable 4 mm fissural nodule at the level of the minor fissure. No pleural fluid or pneumothorax. Stable advanced emphysematous lung disease. Upper Abdomen: No acute abnormality. Musculoskeletal: No chest wall abnormality. No acute or significant osseous findings. Review of the MIP images confirms the above findings. IMPRESSION: 1. Acute pneumonia primarily throughout the right lower lobe but also extending into the posterior right upper lobe and right middle lobe. 2. No evidence of pulmonary embolism. 3. Stable advanced emphysema. 4. Coronary atherosclerosis. Electronically Signed   By: Aletta Edouard M.D.   On: 02/26/2021 15:01   CT ABDOMEN PELVIS W CONTRAST  Result Date: 02/26/2021 CLINICAL DATA:  Respiratory distress, hypoxia and fever. Recent urinary tract infection. EXAM: CT ABDOMEN AND PELVIS WITH CONTRAST TECHNIQUE: Multidetector CT imaging of the abdomen and pelvis was performed using the standard protocol following bolus administration of intravenous contrast. CONTRAST:  16mL OMNIPAQUE IOHEXOL 350 MG/ML SOLN COMPARISON:  Prior CT of the abdomen on 08/29/2010 FINDINGS: Lower chest: Right lower lobe pneumonia. See previously dictated chest CTA report. Hepatobiliary: No focal liver abnormality is seen. Status post cholecystectomy. No biliary dilatation. Pancreas: Unremarkable. No pancreatic ductal dilatation or surrounding inflammatory changes. Spleen: Normal in size without focal abnormality. Adrenals/Urinary Tract: Adrenal glands are unremarkable. Kidneys are normal, without renal calculi, focal lesion, or hydronephrosis. Bladder is mildly distended with suggestion of mild circumferential wall thickening and irregularity. This may reflect chronic outlet obstruction or component of underlying cystitis. There is a TURP defect at the bladder base. Stomach/Bowel: Bowel shows no evidence of obstruction,  ileus, inflammation or lesion. No free intraperitoneal air identified. Vascular/Lymphatic:  Atherosclerosis of the abdominal aorta and iliac arteries without evidence of aneurysmal disease. No lymphadenopathy identified. 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 Central prostate calcifications. Other: No abdominal wall hernia or abnormality. No abdominopelvic ascites. Musculoskeletal: No acute or significant osseous findings. IMPRESSION: 1. No acute findings in the abdomen or pelvis. 2. Mild bladder wall irregularity and thickening which may be reflective of chronic outlet obstruction or component of underlying cystitis. 3. 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 Electronically Signed   By: Aletta Edouard M.D.   On: 02/26/2021 15:14   DG Chest Port 1 View  Result Date: 03/09/2021 CLINICAL DATA:  Left-sided chest pain. EXAM: PORTABLE CHEST 1 VIEW COMPARISON:  February 26, 2021 FINDINGS: Stable, diffuse, chronic appearing increased interstitial lung markings are seen. Mild atelectasis is noted within the bilateral lung bases. There is no evidence of a pleural effusion or pneumothorax. The heart size and mediastinal contours are within normal limits. Marked severity calcification of the aortic arch is seen. Degenerative changes are noted throughout the thoracic spine. IMPRESSION: Chronic appearing increased interstitial lung markings with bibasilar atelectasis. Electronically Signed   By: Virgina Norfolk M.D.   On: 03/09/2021 01:49   DG Chest Port 1 View  Result Date: 02/26/2021 CLINICAL DATA:  copd, sepsis, eval infiltrate, ptx EXAM: PORTABLE CHEST 1 VIEW COMPARISON:  Radiograph 12/14/2020, chest CT 02/19/2021 FINDINGS: Unchanged cardiomediastinal silhouette. There are diffuse interstitial opacities. There are airspace opacities in the right mid lung and right lower lung. There is no large pleural effusion or visible pneumothorax. Hyperinflated lungs with changes of emphysema. There is no acute osseous  abnormality. Thoracic spondylosis. IMPRESSION: Diffuse interstitial opacities with airspace disease in the right mid to lower lung. Findings could reflect asymmetric pulmonary edema and/or multifocal pneumonia. Electronically Signed   By: Maurine Simmering M.D.   On: 02/26/2021 12:21     Assessment and Recommendation  80 y.o. male with acute onset of atrial fibrillation with rapid ventricular rate now significantly improved without evidence of acute coronary syndrome and/or congestive heart failure 1.  Continue low-dose beta-blocker as started last night and tolerating well for maintenance of normal sinus rhythm 2.  Anticoagulation with Eliquis 5 mg twice per day for further risk reduction and stroke with atrial fibrillation 3.  Continue to ambulate and follow for improvements of symptoms and okay for discharge home from cardiac vascular standpoint with follow-up next week  Signed, Serafina Royals M.D. FACC

## 2021-03-10 NOTE — Care Management CC44 (Signed)
Condition Code 44 Documentation Completed  Patient Details  Name: Patrick Montoya MRN: [id number] Date of Birth: 1941-04-15   Condition Code 44 given:  Yes Patient signature on Condition Code 44 notice:  Yes Documentation of 2 MD's agreement:  Yes Code 44 added to claim:  Yes    Kerin Salen, RN 03/10/2021, 1:18 PM

## 2021-03-10 NOTE — Care Management CC44 (Signed)
Condition Code 44 Documentation Completed  Patient Details  Name: ALYSSA MANCERA MRN: [id number] Date of Birth: 23-Jan-1941   Condition Code 44 given:  Yes Patient signature on Condition Code 44 notice:  Yes Documentation of 2 MD's agreement:  Yes Code 44 added to claim:  Yes    Kerin Salen, RN 03/10/2021, 1:22 PM

## 2021-03-10 NOTE — Care Management Obs Status (Signed)
[facility] NOTIFICATION   Patient Details  Name: Patrick Montoya MRN: [id number] Date of Birth: 10/17/1940   Medicare Observation Status Notification Given:       Kerin Salen, RN 03/10/2021, 1:23 PM

## 2021-03-12 LAB — ECHOCARDIOGRAM COMPLETE
AR max vel: 1.39 cm2
AV Peak grad: 14.1 mmHg
Ao pk vel: 1.88 m/s
Area-P 1/2: 3.06 cm2
Height: 68 in
S' Lateral: 3 cm
Weight: 2720 oz

## 2021-04-18 ENCOUNTER — Other Ambulatory Visit: Payer: Self-pay

## 2021-04-18 ENCOUNTER — Emergency Department
Admission: EM | Admit: 2021-04-18 | Discharge: 2021-04-18 | Disposition: A | Discharge status: Home / Self Care | Payer: Medicare Other | Attending: Emergency Medicine | Admitting: Emergency Medicine

## 2021-04-18 ENCOUNTER — Emergency Department: Payer: Medicare Other

## 2021-04-18 DIAGNOSIS — Z7951 Long term (current) use of inhaled steroids: Secondary | ICD-10-CM | POA: Insufficient documentation

## 2021-04-18 DIAGNOSIS — Z79899 Other long term (current) drug therapy: Secondary | ICD-10-CM | POA: Insufficient documentation

## 2021-04-18 DIAGNOSIS — Z7982 Long term (current) use of aspirin: Secondary | ICD-10-CM | POA: Insufficient documentation

## 2021-04-18 DIAGNOSIS — Z87891 Personal history of nicotine dependence: Secondary | ICD-10-CM | POA: Diagnosis not present

## 2021-04-18 DIAGNOSIS — W01198A Fall on same level from slipping, tripping and stumbling with subsequent striking against other object, initial encounter: Secondary | ICD-10-CM | POA: Diagnosis not present

## 2021-04-18 DIAGNOSIS — I4891 Unspecified atrial fibrillation: Secondary | ICD-10-CM | POA: Insufficient documentation

## 2021-04-18 DIAGNOSIS — I10 Essential (primary) hypertension: Secondary | ICD-10-CM | POA: Insufficient documentation

## 2021-04-18 DIAGNOSIS — Z7901 Long term (current) use of anticoagulants: Secondary | ICD-10-CM | POA: Insufficient documentation

## 2021-04-18 DIAGNOSIS — S0990XA Unspecified injury of head, initial encounter: Secondary | ICD-10-CM

## 2021-04-18 DIAGNOSIS — G20 Parkinson's disease: Secondary | ICD-10-CM | POA: Insufficient documentation

## 2021-04-18 DIAGNOSIS — J449 Chronic obstructive pulmonary disease, unspecified: Secondary | ICD-10-CM | POA: Insufficient documentation

## 2021-04-18 DIAGNOSIS — Z85828 Personal history of other malignant neoplasm of skin: Secondary | ICD-10-CM | POA: Insufficient documentation

## 2021-04-18 LAB — CBC
HCT: 43.7 % (ref 39.0–52.0)
Hemoglobin: 14.4 g/dL (ref 13.0–17.0)
MCH: 33.3 pg (ref 26.0–34.0)
MCHC: 33 g/dL (ref 30.0–36.0)
MCV: 101.2 fL — ABNORMAL HIGH (ref 80.0–100.0)
Platelets: 184 10*3/uL (ref 150–400)
RBC: 4.32 MIL/uL (ref 4.22–5.81)
RDW: 13.4 % (ref 11.5–15.5)
WBC: 7.1 10*3/uL (ref 4.0–10.5)
nRBC: 0 % (ref 0.0–0.2)

## 2021-04-18 LAB — BASIC METABOLIC PANEL
Anion gap: 6 (ref 5–15)
BUN: 15 mg/dL (ref 8–23)
CO2: 27 mmol/L (ref 22–32)
Calcium: 9 mg/dL (ref 8.9–10.3)
Chloride: 106 mmol/L (ref 98–111)
Creatinine, Ser: 0.82 mg/dL (ref 0.61–1.24)
GFR, Estimated: >60 mL/min (ref >60)
Glucose, Bld: 133 mg/dL — ABNORMAL HIGH (ref 70–99)
Potassium: 3.9 mmol/L (ref 3.5–5.1)
Sodium: 139 mmol/L (ref 135–145)

## 2021-04-18 NOTE — ED Notes (Addendum)
Rechecked spO2 Room air at 95%.

## 2021-04-18 NOTE — ED Triage Notes (Addendum)
Pt comes with c/o head injury. Pt states he has parkinson and is on blood thinner. Pt states he walked into a telephone pole on Saturday. Pt states he was sent here by his doctor and needs a scan.  Pt states migraines and some little pain. Pt states it knocked him down. Pt states some spells of dizziness.

## 2021-04-18 NOTE — ED Provider Notes (Signed)
[facility]  ____________________________________________   Event Date/Time   First MD Initiated Contact with Patient 04/18/21 [phone]     (approximate)  I have reviewed the triage vital signs and the nursing notes.   HISTORY  Chief Complaint No chief complaint on file.    HPI Patrick Montoya is a 80 y.o. male past medical history of Parkinson's disease, atrial fibrillation on Eliquis, COPD, GERD, hypertension, hepatitis B who presents after a head injury.  5 days ago patient was walking across the street to his neighbor's house and ran into a telephone pole.  He said it was foggy some did not see if that it.  He hit it directly on his forehead and fell backwards.  Did not hit his head when he fell.  He was able to get up on his own did not lose consciousness.  He has had some increase in his chronic migraine headaches since the injury but denies visual change numbness tingling or weakness.  Has some mild neck pain as well.  He denies other injuries.  He is on Eliquis for atrial fibrillation.  He called his neurologist after the incident and was told to come to the emergency department to get a picture of his brain however he has been busy with his wife and other medical appointments for the both of them and so did not seek care immediately.          Past Medical History:  Diagnosis Date   Basal cell carcinoma 07/25/2015   L zygoma inf to lat canthus   Basal cell carcinoma 06/11/2018   L nasal ala   BPH (benign prostatic hyperplasia)    COPD (chronic obstructive pulmonary disease) (HCC)    Elevated liver enzymes    Emphysema of lung (HCC)    Gastric ulcer    GERD (gastroesophageal reflux disease)    Hepatitis B    Hypertension    Parkinson's disease (HCC)    Prostate hypertrophy    Rotator cuff tear right   UTI (urinary tract infection)     Patient Active Problem List   Diagnosis Date Noted   Rapid atrial fibrillation ([place]) 03/09/2021   Chest pain     Atrial fibrillation with rapid ventricular response, new onset ([place])    Acute respiratory failure with hypoxia ([place]) 02/26/2021   Sepsis due to undetermined organism ([place]) 02/26/2021   Bacterial pneumonia 02/26/2021   Parkinson disease ([place]) 03/26/2019   Sternal fracture with retrosternal contusion, closed, initial encounter 10/31/2017   Chronic bronchitis ([place]) 05/28/2015   Gastro-esophageal reflux disease without esophagitis 05/26/2015   Insomnia, 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   COPD with acute exacerbation ([place]) 04/11/2014   Benign prostatic hypertrophy without urinary obstruction 01/21/2014   Acute bronchitis with chronic obstructive pulmonary disease (COPD) ([place]) 01/21/2014   Essential (primary) hypertension 01/21/2014   SINUSITIS- ACUTE-NOS 07/03/2007   RENAL CALCULUS 06/05/2007   HYPERTENSION 02/27/2007   COPD (chronic obstructive pulmonary disease) ([place]) 02/27/2007   GERD 02/27/2007   PEPTIC ULCER DISEASE 02/27/2007   BENIGN PROSTATIC HYPERTROPHY 02/27/2007   BURSITIS, RIGHT SHOULDER 02/27/2007   COLONIC POLYPS, HX OF 02/27/2007   FROZEN RIGHT SHOULDER 12/05/2006    Past Surgical History:  Procedure Laterality Date   CHOLECYSTECTOMY     COLONOSCOPY WITH PROPOFOL N/A 10/20/2015   Procedure: COLONOSCOPY WITH PROPOFOL;  Surgeon: Manya Silvas, MD;  Location: [facility] ENDOSCOPY;  Service: Endoscopy;  Laterality: N/A;   COLONOSCOPY WITH PROPOFOL N/A 01/24/2021   Procedure: COLONOSCOPY WITH PROPOFOL;  Surgeon: Toledo, Benay Pike, MD;  Location: [facility] ENDOSCOPY;  Service: Gastroenterology;  Laterality: N/A;   ESOPHAGOGASTRODUODENOSCOPY (EGD) WITH PROPOFOL N/A 10/20/2015   Procedure: ESOPHAGOGASTRODUODENOSCOPY (EGD) WITH PROPOFOL;  Surgeon: Manya Silvas, MD;  Location: [facility] ENDOSCOPY;  Service: Endoscopy;  Laterality: N/A;   GREEN LIGHT LASER TURP (TRANSURETHRAL RESECTION OF PROSTATE N/A 11/17/2018   Procedure: GREEN LIGHT LASER TURP (TRANSURETHRAL RESECTION OF PROSTATE;  Surgeon: Royston Cowper, MD;  Location: [facility] ORS;  Service: Urology;  Laterality: N/A;   INGUINAL HERNIA REPAIR Right 02/18/2019   Procedure: HERNIA REPAIR INGUINAL ADULT;  Surgeon: Royston Cowper, MD;  Location: [facility] ORS;  Service: Urology;  Laterality: Right;   INSERTION OF MESH Right 02/18/2019   Procedure: INSERTION OF MESH;  Surgeon: Royston Cowper, MD;  Location: [facility] ORS;  Service: Urology;  Laterality: Right;   TONSILLECTOMY     TRANSURETHRAL MICROWAVE THERAPY      Prior to Admission medications   Medication Sig Start Date End Date Taking? Authorizing Provider  albuterol (PROVENTIL HFA;VENTOLIN HFA) 108 (90 Base) MCG/ACT inhaler Inhale 2 puffs into the lungs every 6 (six) hours as needed for wheezing or shortness of breath.    [provider]  ALPRAZolam Duanne Moron) 0.5 MG tablet Take 0.5 mg by mouth at bedtime as needed for anxiety or sleep.    [provider]  apixaban (ELIQUIS) 5 MG TABS tablet Take 1 tablet (5 mg total) by mouth 2 (two) times daily. 03/10/21 07/08/21  Val Riles, MD  aspirin 81 MG tablet Take 81 mg by mouth daily.      [provider]  carbidopa-levodopa (SINEMET IR) 25-100 MG tablet Take 2 tablets by mouth 5 (five) times daily.    [provider]  Cyanocobalamin ([employer] VITAMIN B-12) 2000 MCG TBCR Take 2,000 mcg by mouth daily.    [provider]  doxazosin (CARDURA) 4 MG tablet Take 4 mg by mouth daily.     [provider]  ferrous gluconate (FERGON) 324 MG tablet Take 324 mg by mouth daily with breakfast.    [provider]  Fluticasone-Umeclidin-Vilant (TRELEGY ELLIPTA) 100-62.5-25 MCG/INH AEPB Inhale 1 puff into the lungs daily. 06/16/20   [provider]  metoprolol tartrate (LOPRESSOR) 25 MG tablet Take 1 tablet (25 mg total) by mouth 2 (two) times daily. 03/10/21 04/09/21  Val Riles, MD  omeprazole (PRILOSEC) 40 MG capsule Take 40 mg by mouth daily.  11/06/15   [provider]  potassium  chloride SA (KLOR-CON) 20 MEQ tablet Take 20 mEq by mouth daily.    [provider]  QUEtiapine (SEROQUEL) 50 MG tablet Take 50 mg by mouth at bedtime.    [provider]  rizatriptan (MAXALT) 10 MG tablet Take 10 mg by mouth as needed for migraine. May repeat in 2 hours if needed    [provider]  SUMAtriptan (IMITREX) 50 MG tablet Take 50 mg by mouth every 2 (two) hours as needed for migraine (max 2 tablets/24hrs). May repeat in 2 hours if headache persists or recurs.    [provider]  tiotropium (SPIRIVA) 18 MCG inhalation capsule Place 18 mcg into inhaler and inhale daily.    [provider]    Allergies Patient has no known allergies.  Family History  Problem Relation Age of Onset   Colon cancer Mother    Hepatitis Son    Prostate cancer Neg Hx    Kidney cancer Neg Hx     Social History Social  History   Tobacco Use   Smoking status: Former    Packs/day: 1.00    Years: 62.00    Pack years: 62.00    Types: Cigarettes   Smokeless tobacco: Never  Vaping Use   Vaping Use: Never used  Substance Use Topics   Alcohol use: Yes    Comment: occasional   Drug use: No    Review of Systems   Review of Systems  HENT:  Negative for facial swelling.   Eyes:  Negative for visual disturbance.  Gastrointestinal:  Negative for nausea and vomiting.  Neurological:  Positive for headaches. Negative for weakness and numbness.  All other systems reviewed and are negative.  Physical Exam Updated Vital Signs BP 134/82   Pulse 62   Temp 97.8 F (36.6 C) (Oral)   Resp 18   SpO2 93%   Physical Exam Vitals and nursing note reviewed.  Constitutional:      General: He is not in acute distress.    Appearance: Normal appearance.  HENT:     Head: Normocephalic and atraumatic.     Comments: No signs of trauma Eyes:     General: No scleral icterus.    Conjunctiva/sclera: Conjunctivae normal.  Pulmonary:     Effort: Pulmonary effort is  normal. No respiratory distress.     Breath sounds: Normal breath sounds. No wheezing.  Musculoskeletal:        General: No deformity or signs of injury.     Cervical back: Normal range of motion.  Skin:    Coloration: Skin is not jaundiced or pale.  Neurological:     General: No focal deficit present.     Mental Status: He is alert and oriented to person, place, and time. Mental status is at baseline.     Comments: Aox3, nml speech  PERRL, EOMI, face symmetric, nml tongue movement  5/5 strength in the BL upper and lower extremities     Psychiatric:        Mood and Affect: Mood normal.        Behavior: Behavior normal.     LABS (all labs ordered are listed, but only abnormal results are displayed)  Labs Reviewed  BASIC METABOLIC PANEL - Abnormal; Notable for the following components:      Result Value   Glucose, Bld 133 (*)    All other components within normal limits  CBC - Abnormal; Notable for the following components:   MCV 101.2 (*)    All other components within normal limits   ____________________________________________  EKG  Sinus rhythm with PACs, normal axis, normal intervals, no acute ischemic changes ____________________________________________  RADIOLOGY I, Madelin Headings, personally viewed and evaluated these images (plain radiographs) as part of my medical decision making, as well as reviewing the written report by the radiologist.  ED MD interpretation:    I reviewed the CT scan of the brain which does not show any acute intracranial process   I reviewed the CT of the cervical spine which does not show any acute fracture or misalignment      ____________________________________________   PROCEDURES  Procedure(s) performed (including Critical Care):  Procedures   ____________________________________________   INITIAL IMPRESSION / ASSESSMENT AND PLAN / ED COURSE     The patient is an 80 year old male with history of Parkinson's  disease who ran into a telephone pole about 4 nights ago.  He did fall to the ground but did not hit his head when he fell.  Did not  lose consciousness.  Was told by his urologist that he should have imaging of his brain.  Has had some increased headache frequency since that time but no other neurologic symptoms.  Vital signs notable for mild hypertension otherwise within normal limits.  Patient appears well and has a normal neurologic exam without signs of trauma.  Denies other injuries.  CT head and C-spine obtained which are negative.  Patient is stable for discharge.   ____________________________________________   FINAL CLINICAL IMPRESSION(S) / ED DIAGNOSES  Final diagnoses:  Injury of head, initial encounter     ED Discharge Orders     None        Note:  This document was prepared using Dragon voice recognition software and may include unintentional dictation errors.    Rada Hay, MD 04/18/21 [phone]

## 2021-04-18 NOTE — Discharge Instructions (Addendum)
The CAT scan of your head and neck did not show any acute traumatic findings.  Please follow-up with your primary care provider.

## 2021-05-06 ENCOUNTER — Emergency Department: Payer: Medicare Other

## 2021-05-06 ENCOUNTER — Emergency Department
Admission: EM | Admit: 2021-05-06 | Discharge: 2021-05-06 | Disposition: A | Discharge status: Home / Self Care | Payer: Medicare Other | Attending: Emergency Medicine | Admitting: Emergency Medicine

## 2021-05-06 ENCOUNTER — Other Ambulatory Visit: Payer: Self-pay

## 2021-05-06 DIAGNOSIS — J441 Chronic obstructive pulmonary disease with (acute) exacerbation: Secondary | ICD-10-CM | POA: Insufficient documentation

## 2021-05-06 DIAGNOSIS — Z7951 Long term (current) use of inhaled steroids: Secondary | ICD-10-CM | POA: Insufficient documentation

## 2021-05-06 DIAGNOSIS — I4891 Unspecified atrial fibrillation: Secondary | ICD-10-CM | POA: Diagnosis not present

## 2021-05-06 DIAGNOSIS — Z79899 Other long term (current) drug therapy: Secondary | ICD-10-CM | POA: Insufficient documentation

## 2021-05-06 DIAGNOSIS — I10 Essential (primary) hypertension: Secondary | ICD-10-CM | POA: Diagnosis not present

## 2021-05-06 DIAGNOSIS — Z7901 Long term (current) use of anticoagulants: Secondary | ICD-10-CM | POA: Diagnosis not present

## 2021-05-06 DIAGNOSIS — Z87891 Personal history of nicotine dependence: Secondary | ICD-10-CM | POA: Insufficient documentation

## 2021-05-06 DIAGNOSIS — G20 Parkinson's disease: Secondary | ICD-10-CM | POA: Insufficient documentation

## 2021-05-06 DIAGNOSIS — Z85828 Personal history of other malignant neoplasm of skin: Secondary | ICD-10-CM | POA: Diagnosis not present

## 2021-05-06 DIAGNOSIS — R0789 Other chest pain: Secondary | ICD-10-CM | POA: Diagnosis present

## 2021-05-06 DIAGNOSIS — Z7982 Long term (current) use of aspirin: Secondary | ICD-10-CM | POA: Insufficient documentation

## 2021-05-06 LAB — CBC
HCT: 39.9 % (ref 39.0–52.0)
Hemoglobin: 13.3 g/dL (ref 13.0–17.0)
MCH: 33.7 pg (ref 26.0–34.0)
MCHC: 33.3 g/dL (ref 30.0–36.0)
MCV: 101 fL — ABNORMAL HIGH (ref 80.0–100.0)
Platelets: 206 10*3/uL (ref 150–400)
RBC: 3.95 MIL/uL — ABNORMAL LOW (ref 4.22–5.81)
RDW: 13.2 % (ref 11.5–15.5)
WBC: 9.1 10*3/uL (ref 4.0–10.5)
nRBC: 0 % (ref 0.0–0.2)

## 2021-05-06 LAB — BASIC METABOLIC PANEL
Anion gap: 7 (ref 5–15)
BUN: 17 mg/dL (ref 8–23)
CO2: 23 mmol/L (ref 22–32)
Calcium: 8.9 mg/dL (ref 8.9–10.3)
Chloride: 106 mmol/L (ref 98–111)
Creatinine, Ser: 0.65 mg/dL (ref 0.61–1.24)
GFR, Estimated: >60 mL/min (ref >60)
Glucose, Bld: 111 mg/dL — ABNORMAL HIGH (ref 70–99)
Potassium: 3.8 mmol/L (ref 3.5–5.1)
Sodium: 136 mmol/L (ref 135–145)

## 2021-05-06 LAB — TROPONIN I (HIGH SENSITIVITY)
Troponin I (High Sensitivity): 7 ng/L (ref <18)
Troponin I (High Sensitivity): 10 ng/L (ref <18)

## 2021-05-06 NOTE — ED Provider Notes (Signed)
[facility] Emergency Department Provider Note   ____________________________________________   I have reviewed the triage vital signs and the nursing notes.   HISTORY  Chief Complaint Atrial fibrillation.   History limited by: Not Limited   HPI Patrick Montoya is a 80 y.o. male who presents to the emergency department today because of concern for atrial fibrillation. He states that he was first diagnosed with atrial fibrillation a couple of months ago. Tonight he had a similar sensation of some chest tightness as he did when he was first diagnosed with atrial fibrillation. He did check his pulse with a pulse oximeter and found that his pulse rate was highly variable. It would accelerate into the 120s. Says he has been taking his medication as prescribed. Denies any recent illness or fevers. At the time of my exam he is feeling better.     Records reviewed. Per medical record review patient has a history of admission to the hospital two months ago for new onset afib with RVR.  Past Medical History:  Diagnosis Date   Basal cell carcinoma 07/25/2015   L zygoma inf to lat canthus   Basal cell carcinoma 06/11/2018   L nasal ala   BPH (benign prostatic hyperplasia)    COPD (chronic obstructive pulmonary disease) (HCC)    Elevated liver enzymes    Emphysema of lung (HCC)    Gastric ulcer    GERD (gastroesophageal reflux disease)    Hepatitis B    Hypertension    Parkinson's disease (HCC)    Prostate hypertrophy    Rotator cuff tear right   UTI (urinary tract infection)     Patient Active Problem List   Diagnosis Date Noted   Rapid atrial fibrillation ([place]) 03/09/2021   Chest pain    Atrial fibrillation with rapid ventricular response, new onset ([place])    Acute respiratory failure with hypoxia ([place]) 02/26/2021   Sepsis due to undetermined organism ([place]) 02/26/2021   Bacterial pneumonia 02/26/2021   Parkinson disease ([place]) 03/26/2019   Sternal  fracture with retrosternal contusion, closed, initial encounter 10/31/2017   Chronic bronchitis ([place]) 05/28/2015   Gastro-esophageal reflux disease without esophagitis 05/26/2015   Insomnia, 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   COPD with acute exacerbation ([place]) 04/11/2014   Benign prostatic hypertrophy without urinary obstruction 01/21/2014   Acute bronchitis with chronic obstructive pulmonary disease (COPD) ([place]) 01/21/2014   Essential (primary) hypertension 01/21/2014   SINUSITIS- ACUTE-NOS 07/03/2007   RENAL CALCULUS 06/05/2007   HYPERTENSION 02/27/2007   COPD (chronic obstructive pulmonary disease) ([place]) 02/27/2007   GERD 02/27/2007   PEPTIC ULCER DISEASE 02/27/2007   BENIGN PROSTATIC HYPERTROPHY 02/27/2007   BURSITIS, RIGHT SHOULDER 02/27/2007   COLONIC POLYPS, HX OF 02/27/2007   FROZEN RIGHT SHOULDER 12/05/2006    Past Surgical History:  Procedure Laterality Date   CHOLECYSTECTOMY     COLONOSCOPY WITH PROPOFOL N/A 10/20/2015   Procedure: COLONOSCOPY WITH PROPOFOL;  Surgeon: Manya Silvas, MD;  Location: [facility] ENDOSCOPY;  Service: Endoscopy;  Laterality: N/A;   COLONOSCOPY WITH PROPOFOL N/A 01/24/2021   Procedure: COLONOSCOPY WITH PROPOFOL;  Surgeon: Tol[place], Benay Pike, MD;  Location: [facility] ENDOSCOPY;  Service: Gastroenterology;  Laterality: N/A;   ESOPHAGOGASTRODUODENOSCOPY (EGD) WITH PROPOFOL N/A 10/20/2015   Procedure: ESOPHAGOGASTRODUODENOSCOPY (EGD) WITH PROPOFOL;  Surgeon: Manya Silvas, MD;  Location: [facility] ENDOSCOPY;  Service: Endoscopy;  Laterality: N/A;   GREEN LIGHT LASER TURP (TRANSURETHRAL RESECTION OF PROSTATE N/A 11/17/2018   Procedure: GREEN LIGHT LASER TURP (TRANSURETHRAL RESECTION OF PROSTATE;  Surgeon:  Royston Cowper, MD;  Location: [facility] ORS;  Service: Urology;  Laterality: N/A;   INGUINAL HERNIA REPAIR Right 02/18/2019   Procedure: HERNIA REPAIR INGUINAL ADULT;  Surgeon: Royston Cowper, MD;  Location: [facility] ORS;  Service: Urology;  Laterality: Right;   INSERTION OF MESH  Right 02/18/2019   Procedure: INSERTION OF MESH;  Surgeon: Royston Cowper, MD;  Location: [facility] ORS;  Service: Urology;  Laterality: Right;   TONSILLECTOMY     TRANSURETHRAL MICROWAVE THERAPY      Prior to Admission medications   Medication Sig Start Date End Date Taking? Authorizing Provider  albuterol (PROVENTIL HFA;VENTOLIN HFA) 108 (90 Base) MCG/ACT inhaler Inhale 2 puffs into the lungs every 6 (six) hours as needed for wheezing or shortness of breath.    [provider]  ALPRAZolam Duanne Moron) 0.5 MG tablet Take 0.5 mg by mouth at bedtime as needed for anxiety or sleep.    [provider]  apixaban (ELIQUIS) 5 MG TABS tablet Take 1 tablet (5 mg total) by mouth 2 (two) times daily. 03/10/21 07/08/21  Val Riles, MD  aspirin 81 MG tablet Take 81 mg by mouth daily.      [provider]  carbidopa-levodopa (SINEMET IR) 25-100 MG tablet Take 2 tablets by mouth 5 (five) times daily.    [provider]  Cyanocobalamin ([employer] VITAMIN B-12) 2000 MCG TBCR Take 2,000 mcg by mouth daily.    [provider]  doxazosin (CARDURA) 4 MG tablet Take 4 mg by mouth daily.     [provider]  ferrous gluconate (FERGON) 324 MG tablet Take 324 mg by mouth daily with breakfast.    [provider]  Fluticasone-Umeclidin-Vilant (TRELEGY ELLIPTA) 100-62.5-25 MCG/INH AEPB Inhale 1 puff into the lungs daily. 06/16/20   [provider]  metoprolol tartrate (LOPRESSOR) 25 MG tablet Take 1 tablet (25 mg total) by mouth 2 (two) times daily. 03/10/21 04/09/21  Val Riles, MD  omeprazole (PRILOSEC) 40 MG capsule Take 40 mg by mouth daily.  11/06/15   [provider]  potassium chloride SA (KLOR-CON) 20 MEQ tablet Take 20 mEq by mouth daily.    [provider]  QUEtiapine (SEROQUEL) 50 MG tablet Take 50 mg by mouth at bedtime.    [provider]  rizatriptan (MAXALT) 10 MG tablet Take 10 mg by mouth as needed for migraine. May  repeat in 2 hours if needed    [provider]  SUMAtriptan (IMITREX) 50 MG tablet Take 50 mg by mouth every 2 (two) hours as needed for migraine (max 2 tablets/24hrs). May repeat in 2 hours if headache persists or recurs.    [provider]  tiotropium (SPIRIVA) 18 MCG inhalation capsule Place 18 mcg into inhaler and inhale daily.    [provider]    Allergies Patient has no known allergies.  Family History  Problem Relation Age of Onset   Colon cancer Mother    Hepatitis Son    Prostate cancer Neg Hx    Kidney cancer Neg Hx     Social History Social History   Tobacco Use   Smoking status: Former    Packs/day: 1.00    Years: 62.00    Pack years: 62.00    Types: Cigarettes   Smokeless tobacco: Never  Vaping Use   Vaping Use: Never used  Substance Use Topics   Alcohol use: Yes    Comment: occasional   Drug use: No    Review of Systems Constitutional:  No fever/chills Eyes: No visual changes. ENT: No sore throat. Cardiovascular: Positive for chest discomfort.  Respiratory: Denies shortness of breath. Gastrointestinal: No abdominal pain.  No nausea, no vomiting.  No diarrhea.   Genitourinary: Negative for dysuria. Musculoskeletal: Negative for back pain. Skin: Negative for rash. Neurological: Negative for headaches, focal weakness or numbness.  ____________________________________________   PHYSICAL EXAM:  VITAL SIGNS: ED Triage Vitals  Enc Vitals Group     BP 05/06/21 0146 124/88     Pulse Rate 05/06/21 0146 (!) 103     Resp 05/06/21 0146 20     Temp 05/06/21 0147 98.1 F (36.7 C)     Temp Source 05/06/21 0147 Oral     SpO2 05/06/21 0146 90 %     Weight 05/06/21 0148 172 lb (78 kg)     Height 05/06/21 0148 5\' 9"  (1.753 m)     Head Circumference --      Peak Flow --      Pain Score 05/06/21 0148 0    Constitutional: Alert and oriented.  Eyes: Conjunctivae are normal.  ENT      Head: Normocephalic and atraumatic.       Nose: No congestion/rhinnorhea.      Mouth/Throat: Mucous membranes are moist.      Neck: No stridor. Hematological/Lymphatic/Immunilogical: No cervical lymphadenopathy. Cardiovascular: Normal rate, irregular rhythm.  No murmurs, rubs, or gallops.  Respiratory: Normal respiratory effort without tachypnea nor retractions. Breath sounds are clear and equal bilaterally. No wheezes/rales/rhonchi. Gastrointestinal: Soft and non tender. No rebound. No guarding.  Genitourinary: Deferred Musculoskeletal: Normal range of motion in all extremities. No lower extremity edema. Neurologic:  Normal speech and language. No gross focal neurologic deficits are appreciated.  Skin:  Skin is warm, dry and intact. No rash noted. Psychiatric: Mood and affect are normal. Speech and behavior are normal. Patient exhibits appropriate insight and judgment.  ____________________________________________    LABS (pertinent positives/negatives)  CBC wbc 9.1, hgb 13.3, plt 206 BMP wnl except glu 111 Trop hs 7 -> 10 ____________________________________________   EKG  I, Nance Pear, attending physician, personally viewed and interpreted this EKG  EKG Time: 0145 Rate: 96 Rhythm: atrial fibrillation Axis: normal Intervals: qtc 464 QRS: narrow ST changes: no st elevation Impression: abnormal ekg   ____________________________________________    RADIOLOGY  CXR No acute cardiopulmonary disease. Emphysema.   ____________________________________________   PROCEDURES  Procedures  ____________________________________________   INITIAL IMPRESSION / ASSESSMENT AND PLAN / ED COURSE  Pertinent labs & imaging results that were available during my care of the patient were reviewed by me and considered in my medical decision making (see chart for details).  Patient presented to the emergency department today because of concerns for atrial fibrillation.  At the time my exam patient is rate is well  controlled.  Troponin negative x2.  Chest x-ray without any concerning findings.  I had a discussion with the patient about rate control.  At this time given the patient does have good rate control I think it is reasonable for him to be discharged home.  Did discuss possibility of spontaneous conversion back to sinus rhythm.  Discussed with patient importance of follow-up with cardiology.  ____________________________________________   FINAL CLINICAL IMPRESSION(S) / ED DIAGNOSES  Final diagnoses:  A-fib ([place])     Note: This dictation was prepared with Dragon dictation. Any transcriptional errors that result from this process are unintentional     Nance Pear, MD 05/06/21 [phone]

## 2021-05-06 NOTE — ED Triage Notes (Signed)
Pt arrives via [facility] from home. Reports chest pressure for the last hour - states this feels the same as the last time he went into afib. EMS gave metoprolol and eliquis before leaving home. EMS gave 20 IV push of Cardeen. EMS VS: Afib rvr 140-170 138/72 93% RA

## 2021-05-06 NOTE — Discharge Instructions (Signed)
Please seek medical attention for any high fevers, chest pain, shortness of breath, change in behavior, persistent vomiting, bloody stool or any other new or concerning symptoms.  

## 2021-05-22 ENCOUNTER — Emergency Department (HOSPITAL_COMMUNITY)
Admission: EM | Admit: 2021-05-22 | Discharge: 2021-05-23 | Disposition: A | Discharge status: Home / Self Care | Payer: Medicare Other | Attending: Emergency Medicine | Admitting: Emergency Medicine

## 2021-05-22 ENCOUNTER — Emergency Department (HOSPITAL_COMMUNITY): Payer: Medicare Other

## 2021-05-22 DIAGNOSIS — Z7982 Long term (current) use of aspirin: Secondary | ICD-10-CM | POA: Insufficient documentation

## 2021-05-22 DIAGNOSIS — G20 Parkinson's disease: Secondary | ICD-10-CM | POA: Insufficient documentation

## 2021-05-22 DIAGNOSIS — I10 Essential (primary) hypertension: Secondary | ICD-10-CM | POA: Diagnosis not present

## 2021-05-22 DIAGNOSIS — Z7951 Long term (current) use of inhaled steroids: Secondary | ICD-10-CM | POA: Insufficient documentation

## 2021-05-22 DIAGNOSIS — J181 Lobar pneumonia, unspecified organism: Secondary | ICD-10-CM | POA: Insufficient documentation

## 2021-05-22 DIAGNOSIS — Z7901 Long term (current) use of anticoagulants: Secondary | ICD-10-CM | POA: Insufficient documentation

## 2021-05-22 DIAGNOSIS — J449 Chronic obstructive pulmonary disease, unspecified: Secondary | ICD-10-CM | POA: Diagnosis not present

## 2021-05-22 DIAGNOSIS — I4891 Unspecified atrial fibrillation: Secondary | ICD-10-CM | POA: Diagnosis not present

## 2021-05-22 DIAGNOSIS — J189 Pneumonia, unspecified organism: Secondary | ICD-10-CM

## 2021-05-22 DIAGNOSIS — Z79899 Other long term (current) drug therapy: Secondary | ICD-10-CM | POA: Diagnosis not present

## 2021-05-22 DIAGNOSIS — R079 Chest pain, unspecified: Secondary | ICD-10-CM | POA: Diagnosis present

## 2021-05-22 LAB — CBC WITH DIFFERENTIAL/PLATELET
Abs Immature Granulocytes: 0.02 10*3/uL (ref 0.00–0.07)
Basophils Absolute: 0 10*3/uL (ref 0.0–0.1)
Basophils Relative: 1 %
Eosinophils Absolute: 0.3 10*3/uL (ref 0.0–0.5)
Eosinophils Relative: 4 %
HCT: 43.4 % (ref 39.0–52.0)
Hemoglobin: 14.3 g/dL (ref 13.0–17.0)
Immature Granulocytes: 0 %
Lymphocytes Relative: 32 %
Lymphs Abs: 2.2 10*3/uL (ref 0.7–4.0)
MCH: 34 pg (ref 26.0–34.0)
MCHC: 32.9 g/dL (ref 30.0–36.0)
MCV: 103.1 fL — ABNORMAL HIGH (ref 80.0–100.0)
Monocytes Absolute: 0.5 10*3/uL (ref 0.1–1.0)
Monocytes Relative: 7 %
Neutro Abs: 3.9 10*3/uL (ref 1.7–7.7)
Neutrophils Relative %: 56 %
Platelets: 240 10*3/uL (ref 150–400)
RBC: 4.21 MIL/uL — ABNORMAL LOW (ref 4.22–5.81)
RDW: 13.5 % (ref 11.5–15.5)
WBC: 6.9 10*3/uL (ref 4.0–10.5)
nRBC: 0 % (ref 0.0–0.2)

## 2021-05-22 LAB — MAGNESIUM: Magnesium: 1.9 mg/dL (ref 1.7–2.4)

## 2021-05-22 LAB — COMPREHENSIVE METABOLIC PANEL
ALT: 13 U/L (ref 0–44)
AST: 32 U/L (ref 15–41)
Albumin: 3.3 g/dL — ABNORMAL LOW (ref 3.5–5.0)
Alkaline Phosphatase: 68 U/L (ref 38–126)
Anion gap: 11 (ref 5–15)
BUN: 16 mg/dL (ref 8–23)
CO2: 25 mmol/L (ref 22–32)
Calcium: 8.9 mg/dL (ref 8.9–10.3)
Chloride: 106 mmol/L (ref 98–111)
Creatinine, Ser: 0.74 mg/dL (ref 0.61–1.24)
GFR, Estimated: >60 mL/min (ref >60)
Glucose, Bld: 187 mg/dL — ABNORMAL HIGH (ref 70–99)
Potassium: 4.3 mmol/L (ref 3.5–5.1)
Sodium: 142 mmol/L (ref 135–145)
Total Bilirubin: 0.6 mg/dL (ref 0.3–1.2)
Total Protein: 6.4 g/dL — ABNORMAL LOW (ref 6.5–8.1)

## 2021-05-22 NOTE — ED Provider Notes (Signed)
[facility] EMERGENCY DEPARTMENT Provider Note   CSN: [id number] Arrival date & time: 05/22/21  2155     History  Chief Complaint  Patient presents with   AFIB RVR    Patrick Montoya is a 81 y.o. male presenting for evaluation of A. fib. Patient states he was sitting on his couch, checking his heart rate like he normally does in the evenings when he noticed it was high and that it was waiting is in A. fib rhythm.  As such, he called EMS.  He was asymptomatic at the time, no chest pain, shortness of breath, palpitations, dizziness or lightheadedness.  However per triage note, EMS was called due to shortness of breath and chest pain.  Patient states he has been taking his medication as prescribed including his blood thinner and his metoprolol.  He took his metoprolol about 15 minutes prior to checking his heart rate.  He currently is symptom-free, feeling well.  He has a follow-up appointment with cardiology in the next 2 weeks.  Patient states last week he had 4 days of fever, including up to 103.  Was not put on any antibiotics.  Additional history obtained from EMS/triage note.  Per EMS, patient's initial heart rate was 180.  Given 10 mg of Cardizem with improvement of heart rate.  Struve hypertension, COPD, A. fib on anticoagulation, Parkinson's  HPI     Home Medications Prior to Admission medications   Medication Sig Start Date End Date Taking? Authorizing Provider  amoxicillin-clavulanate (AUGMENTIN) 875-125 MG tablet Take 1 tablet by mouth every 12 (twelve) hours. 05/23/21  Yes Toniqua Melamed, PA-C  azithromycin (ZITHROMAX) 250 MG tablet Take 1 tablet (250 mg total) by mouth daily. Take first 2 tablets together, then 1 every day until finished. 05/23/21  Yes Elim Peale, PA-C  albuterol (PROVENTIL HFA;VENTOLIN HFA) 108 (90 Base) MCG/ACT inhaler Inhale 2 puffs into the lungs every 6 (six) hours as needed for wheezing or shortness of breath.    [provider]  ALPRAZolam Duanne Moron) 0.5 MG tablet Take 0.5 mg by mouth at bedtime as needed for anxiety or sleep.    [provider]  apixaban (ELIQUIS) 5 MG TABS tablet Take 1 tablet (5 mg total) by mouth 2 (two) times daily. 03/10/21 07/08/21  Val Riles, MD  aspirin 81 MG tablet Take 81 mg by mouth daily.      [provider]  carbidopa-levodopa (SINEMET IR) 25-100 MG tablet Take 2 tablets by mouth 5 (five) times daily.    [provider]  Cyanocobalamin ([employer] VITAMIN B-12) 2000 MCG TBCR Take 2,000 mcg by mouth daily.    [provider]  doxazosin (CARDURA) 4 MG tablet Take 4 mg by mouth daily.     [provider]  ferrous gluconate (FERGON) 324 MG tablet Take 324 mg by mouth daily with breakfast.    [provider]  Fluticasone-Umeclidin-Vilant (TRELEGY ELLIPTA) 100-62.5-25 MCG/INH AEPB Inhale 1 puff into the lungs daily. 06/16/20   [provider]  metoprolol tartrate (LOPRESSOR) 25 MG tablet Take 1 tablet (25 mg total) by mouth 2 (two) times daily. 03/10/21 04/09/21  Val Riles, MD  omeprazole (PRILOSEC) 40 MG capsule Take 40 mg by mouth daily.  11/06/15   [provider]  potassium chloride SA (KLOR-CON) 20 MEQ tablet Take 20 mEq by mouth daily.    [provider]  QUEtiapine (SEROQUEL) 50 MG tablet Take 50 mg by mouth at bedtime.    [provider]  rizatriptan (MAXALT) 10 MG tablet Take 10 mg by mouth as needed for migraine. May repeat in 2 hours if needed    [provider]  SUMAtriptan (IMITREX) 50 MG tablet Take 50 mg by mouth every 2 (two) hours as needed for migraine (max 2 tablets/24hrs). May repeat in 2 hours if headache persists or recurs.    [provider]  tiotropium (SPIRIVA) 18 MCG inhalation capsule Place 18 mcg into inhaler and inhale daily.    [provider]      Allergies    Patient has no known allergies.    Review of Systems   Review of Systems   Respiratory:  Positive for shortness of breath.   Cardiovascular:  Positive for chest pain.  Hematological:  Bruises/bleeds easily.  All other systems reviewed and are negative.  Physical Exam Updated Vital Signs BP 132/72    Pulse 62    Temp 98.1 F (36.7 C) (Oral)    Resp 15    SpO2 92%  Physical Exam Vitals and nursing note reviewed.  Constitutional:      General: He is not in acute distress.    Appearance: Normal appearance.     Comments: Resting in the bed in NAD  HENT:     Head: Normocephalic and atraumatic.  Eyes:     Conjunctiva/sclera: Conjunctivae normal.     Pupils: Pupils are equal, round, and reactive to light.  Cardiovascular:     Rate and Rhythm: Normal rate. Rhythm irregular.     Pulses: Normal pulses.     Comments: Irregularly irregular between 60 and 70 Pulmonary:     Effort: Pulmonary effort is normal. No respiratory distress.     Breath sounds: Normal breath sounds. No wheezing.     Comments: Speaking in full sentences.  Clear lung sounds in all fields. Abdominal:     General: There is no distension.     Palpations: Abdomen is soft. There is no mass.     Tenderness: There is no abdominal tenderness. There is no guarding or rebound.  Musculoskeletal:        General: Normal range of motion.     Cervical back: Normal range of motion and neck supple.     Right lower leg: No edema.     Left lower leg: No edema.  Skin:    General: Skin is warm and dry.     Capillary Refill: Capillary refill takes less than 2 seconds.  Neurological:     Mental Status: He is alert and oriented to person, place, and time.  Psychiatric:        Mood and Affect: Mood and affect normal.        Speech: Speech normal.        Behavior: Behavior normal.    ED Results / Procedures / Treatments   Labs (all labs ordered are listed, but only abnormal results are displayed) Labs Reviewed  CBC WITH DIFFERENTIAL/PLATELET - Abnormal; Notable for the following components:      Result  Value   RBC 4.21 (*)    MCV 103.1 (*)    All other components within normal limits  COMPREHENSIVE METABOLIC PANEL - Abnormal; Notable for the following components:   Glucose, Bld 187 (*)    Total Protein 6.4 (*)    Albumin 3.3 (*)    All other components within normal limits  URINALYSIS, ROUTINE W REFLEX MICROSCOPIC - Abnormal; Notable for the following components:   Leukocytes,Ua SMALL (*)  All other components within normal limits  URINALYSIS, MICROSCOPIC (REFLEX) - Abnormal; Notable for the following components:   Bacteria, UA RARE (*)    All other components within normal limits  MAGNESIUM    EKG EKG Interpretation  Date/Time:  Tuesday May 22 2021 22:14:26 EST Ventricular Rate:  82 PR Interval:    QRS Duration: 89 QT Interval:  397 QTC Calculation: 464 R Axis:   48 Text Interpretation: Atrial fibrillation Confirmed by Regan Lemming ([id number]) on 05/22/2021 11:31:47 PM  Radiology DG Chest 2 View  Result Date: 05/22/2021 CLINICAL DATA:  Atrial fibrillation EXAM: CHEST - 2 VIEW COMPARISON:  05/06/2021, CT 02/26/2021 FINDINGS: Emphysema with bronchitic changes. Patchy airspace disease in the left lower lung. Stable cardiomediastinal silhouette. No pneumothorax. IMPRESSION: Emphysema. Patchy ground-glass opacity suspected at the left lower lung which may be due to pneumonia. Electronically Signed   By: Donavan Foil M.D.   On: 05/22/2021 23:10    Procedures Procedures    Medications Ordered in ED Medications - No data to display  ED Course/ Medical Decision Making/ A&P                           Medical Decision Making   This patient presents to the ED for concern of cp and sob. This involves an extensive number of treatment options, and is a complaint that carries with it a high risk of complications and morbidity.  The differential diagnosis includes infection, left light abnormality, dehydration, A. fib, PE, ACS.   Co morbidities: A. fib on anticoagulation, COPD,  hypertension, Parkinson's   Additional history: Reviewed recent ER visits from outside hospitals for A. fib.   Lab Tests:  I ordered, and personally interpreted labs.  The pertinent results include: No leukocytosis.  Electrolytes stable.   Imaging Studies:  I ordered imaging studies including chest x-ray I independently visualized and interpreted imaging which showed left lower lobe opacity consistent with pneumonia. I agree with the radiologist interpretation   Cardiac Monitoring:  The patient was maintained on a cardiac monitor.  I personally viewed and interpreted the cardiac monitored which showed an underlying rhythm of: A. fib rate controlled  Dispostion:  After consideration of the diagnostic results and the patients response to treatment, I feel that the patent would benefit from discharge home with close cardiology follow-up.  Discussed findings and plan with patient.  Discussed importance of taking medication as prescribed.  Discussed starting antibiotics for treatment of possible pneumonia as he did have a fever last week and has opacity on his chest x-ray.  Discussed importance of calling cardiology to get a close follow-up appointment.  At this time, patient appears safe for discharge.  Return precautions given.  Patient states he understands and agrees to plan.  Final Clinical Impression(s) / ED Diagnoses Final diagnoses:  Atrial fibrillation, unspecified type ([place])  Community acquired pneumonia of left lower lobe of lung    Rx / DC Orders ED Discharge Orders          Ordered    amoxicillin-clavulanate (AUGMENTIN) 875-125 MG tablet  Every 12 hours        05/23/21 0150    azithromycin (ZITHROMAX) 250 MG tablet  Daily        05/23/21 0150              Franchot Heidelberg, PA-C 05/23/21 0200    Regan Lemming, MD 05/23/21 [phone]

## 2021-05-22 NOTE — ED Triage Notes (Signed)
Pt arrived via [facility] for cc of AFIB RVR. Pt called EMS with complaint of SOB, CP. Initial HR 180, 10 mg Cardizem administered via 18g L FA. Pt symptoms resolved, HR 84 post administration. Pt self administered 324 ASA prior to EMS arrival.   HR 84 BP 130/90 RR 20 SPO2 89% RA -> 94% 3LNC CBG 183

## 2021-05-23 LAB — URINALYSIS, ROUTINE W REFLEX MICROSCOPIC
Bilirubin Urine: NEGATIVE
Glucose, UA: NEGATIVE mg/dL
Hgb urine dipstick: NEGATIVE
Ketones, ur: NEGATIVE mg/dL
Nitrite: NEGATIVE
Protein, ur: NEGATIVE mg/dL
Specific Gravity, Urine: 1.01 (ref 1.005–1.030)
pH: 5.5 (ref 5.0–8.0)

## 2021-05-23 LAB — URINALYSIS, MICROSCOPIC (REFLEX)

## 2021-05-23 MED ORDER — AMOXICILLIN-POT CLAVULANATE 875-125 MG PO TABS: 1.0000 | ORAL_TABLET | Freq: Two times a day (BID) | ORAL | 0 refills | Status: DC

## 2021-05-23 MED ORDER — AZITHROMYCIN 250 MG PO TABS: 250.0000 mg | ORAL_TABLET | Freq: Every day | ORAL | 0 refills | Status: DC

## 2021-05-23 NOTE — Discharge Instructions (Signed)
Continue taking home medications as prescribed. Take your antibiotics as prescribed.  Take the entire course, even if symptoms improve. Call your cardiologist to discuss that you were in the ER again today, they may be able to get you a sooner appointment. Return to the emergency room if you develop difficulty breathing, chest pain, shortness of breath, dizziness/lightheadedness, or any new, worsening, or concerning symptoms.

## 2021-06-20 DIAGNOSIS — Z7901 Long term (current) use of anticoagulants: Secondary | ICD-10-CM

## 2021-06-29 ENCOUNTER — Emergency Department: Payer: Medicare Other

## 2021-06-29 ENCOUNTER — Other Ambulatory Visit: Payer: Self-pay

## 2021-06-29 ENCOUNTER — Observation Stay
Admission: EM | Admit: 2021-06-29 | Discharge: 2021-06-30 | Disposition: A | Discharge status: Home / Self Care | Payer: Medicare Other | Attending: Internal Medicine | Admitting: Internal Medicine

## 2021-06-29 DIAGNOSIS — G20 Parkinson's disease: Secondary | ICD-10-CM | POA: Insufficient documentation

## 2021-06-29 DIAGNOSIS — I480 Paroxysmal atrial fibrillation: Secondary | ICD-10-CM | POA: Diagnosis not present

## 2021-06-29 DIAGNOSIS — J9611 Chronic respiratory failure with hypoxia: Secondary | ICD-10-CM

## 2021-06-29 DIAGNOSIS — J449 Chronic obstructive pulmonary disease, unspecified: Secondary | ICD-10-CM | POA: Diagnosis present

## 2021-06-29 DIAGNOSIS — Z85828 Personal history of other malignant neoplasm of skin: Secondary | ICD-10-CM | POA: Insufficient documentation

## 2021-06-29 DIAGNOSIS — Z7901 Long term (current) use of anticoagulants: Secondary | ICD-10-CM | POA: Insufficient documentation

## 2021-06-29 DIAGNOSIS — I10 Essential (primary) hypertension: Secondary | ICD-10-CM | POA: Insufficient documentation

## 2021-06-29 DIAGNOSIS — Z87891 Personal history of nicotine dependence: Secondary | ICD-10-CM | POA: Insufficient documentation

## 2021-06-29 DIAGNOSIS — R0789 Other chest pain: Secondary | ICD-10-CM

## 2021-06-29 DIAGNOSIS — Z7982 Long term (current) use of aspirin: Secondary | ICD-10-CM | POA: Insufficient documentation

## 2021-06-29 DIAGNOSIS — Z79899 Other long term (current) drug therapy: Secondary | ICD-10-CM | POA: Insufficient documentation

## 2021-06-29 DIAGNOSIS — Z20822 Contact with and (suspected) exposure to covid-19: Secondary | ICD-10-CM | POA: Insufficient documentation

## 2021-06-29 DIAGNOSIS — Z8711 Personal history of peptic ulcer disease: Secondary | ICD-10-CM

## 2021-06-29 DIAGNOSIS — I4891 Unspecified atrial fibrillation: Secondary | ICD-10-CM

## 2021-06-29 LAB — COMPREHENSIVE METABOLIC PANEL
ALT: 11 U/L (ref 0–44)
AST: 24 U/L (ref 15–41)
Albumin: 3.5 g/dL (ref 3.5–5.0)
Alkaline Phosphatase: 71 U/L (ref 38–126)
Anion gap: 3 — ABNORMAL LOW (ref 5–15)
BUN: 16 mg/dL (ref 8–23)
CO2: 26 mmol/L (ref 22–32)
Calcium: 8.6 mg/dL — ABNORMAL LOW (ref 8.9–10.3)
Chloride: 110 mmol/L (ref 98–111)
Creatinine, Ser: 0.73 mg/dL (ref 0.61–1.24)
GFR, Estimated: >60 mL/min (ref >60)
Glucose, Bld: 161 mg/dL — ABNORMAL HIGH (ref 70–99)
Potassium: 3.5 mmol/L (ref 3.5–5.1)
Sodium: 139 mmol/L (ref 135–145)
Total Bilirubin: 0.7 mg/dL (ref 0.3–1.2)
Total Protein: 6.7 g/dL (ref 6.5–8.1)

## 2021-06-29 LAB — CBC WITH DIFFERENTIAL/PLATELET
Abs Immature Granulocytes: 0.02 10*3/uL (ref 0.00–0.07)
Basophils Absolute: 0 10*3/uL (ref 0.0–0.1)
Basophils Relative: 1 %
Eosinophils Absolute: 0.3 10*3/uL (ref 0.0–0.5)
Eosinophils Relative: 5 %
HCT: 41.3 % (ref 39.0–52.0)
Hemoglobin: 13.5 g/dL (ref 13.0–17.0)
Immature Granulocytes: 0 %
Lymphocytes Relative: 23 %
Lymphs Abs: 1.5 10*3/uL (ref 0.7–4.0)
MCH: 32.8 pg (ref 26.0–34.0)
MCHC: 32.7 g/dL (ref 30.0–36.0)
MCV: 100.5 fL — ABNORMAL HIGH (ref 80.0–100.0)
Monocytes Absolute: 0.6 10*3/uL (ref 0.1–1.0)
Monocytes Relative: 9 %
Neutro Abs: 3.9 10*3/uL (ref 1.7–7.7)
Neutrophils Relative %: 62 %
Platelets: 145 10*3/uL — ABNORMAL LOW (ref 150–400)
RBC: 4.11 MIL/uL — ABNORMAL LOW (ref 4.22–5.81)
RDW: 13.4 % (ref 11.5–15.5)
WBC: 6.2 10*3/uL (ref 4.0–10.5)
nRBC: 0 % (ref 0.0–0.2)

## 2021-06-29 LAB — TROPONIN I (HIGH SENSITIVITY)
Troponin I (High Sensitivity): 25 ng/L — ABNORMAL HIGH (ref <18)
Troponin I (High Sensitivity): 53 ng/L — ABNORMAL HIGH (ref <18)

## 2021-06-29 LAB — GLUCOSE, CAPILLARY: Glucose-Capillary: 98 mg/dL (ref 70–99)

## 2021-06-29 LAB — RESP PANEL BY RT-PCR (FLU A&B, COVID) ARPGX2
Influenza A by PCR: NEGATIVE
Influenza B by PCR: NEGATIVE
SARS Coronavirus 2 by RT PCR: NEGATIVE

## 2021-06-29 LAB — MAGNESIUM: Magnesium: 1.8 mg/dL (ref 1.7–2.4)

## 2021-06-29 LAB — BRAIN NATRIURETIC PEPTIDE: B Natriuretic Peptide: 45.9 pg/mL (ref 0.0–100.0)

## 2021-06-29 MED ORDER — ONDANSETRON HCL 4 MG/2ML IJ SOLN: 4.0000 mg | Freq: Four times a day (QID) | INTRAMUSCULAR | Status: DC | PRN

## 2021-06-29 MED ORDER — DOCUSATE SODIUM 100 MG PO CAPS: 100.0000 mg | ORAL_CAPSULE | Freq: Two times a day (BID) | ORAL | Status: DC | PRN

## 2021-06-29 MED ORDER — SUMATRIPTAN SUCCINATE 50 MG PO TABS
50.0000 mg | ORAL_TABLET | ORAL | Status: DC | PRN
  Administered 2021-06-29 (×2): 50 mg via ORAL
  Filled 2021-06-29 (×5): qty 1

## 2021-06-29 MED ORDER — ALBUTEROL SULFATE (2.5 MG/3ML) 0.083% IN NEBU
2.5000 mg | INHALATION_SOLUTION | Freq: Four times a day (QID) | RESPIRATORY_TRACT | Status: DC | PRN
  Administered 2021-06-29: 2.5 mg via RESPIRATORY_TRACT
  Filled 2021-06-29: qty 3

## 2021-06-29 MED ORDER — SODIUM CHLORIDE 0.9 % IV BOLUS
500.0000 mL | Freq: Once | INTRAVENOUS | Status: AC
  Administered 2021-06-29: 500 mL via INTRAVENOUS

## 2021-06-29 MED ORDER — QUETIAPINE FUMARATE 25 MG PO TABS
50.0000 mg | ORAL_TABLET | Freq: Every day | ORAL | Status: DC
  Administered 2021-06-29: 50 mg via ORAL
  Filled 2021-06-29: qty 2

## 2021-06-29 MED ORDER — ALPRAZOLAM 0.25 MG PO TABS
0.2500 mg | ORAL_TABLET | Freq: Two times a day (BID) | ORAL | Status: DC | PRN
  Administered 2021-06-30: 0.25 mg via ORAL
  Filled 2021-06-29: qty 1

## 2021-06-29 MED ORDER — APIXABAN 5 MG PO TABS
5.0000 mg | ORAL_TABLET | Freq: Two times a day (BID) | ORAL | Status: DC
  Administered 2021-06-29 – 2021-06-30 (×3): 5 mg via ORAL
  Filled 2021-06-29 (×3): qty 1

## 2021-06-29 MED ORDER — METOPROLOL TARTRATE 5 MG/5ML IV SOLN
5.0000 mg | Freq: Once | INTRAVENOUS | Status: AC
  Administered 2021-06-29: 5 mg via INTRAVENOUS
  Filled 2021-06-29: qty 5

## 2021-06-29 MED ORDER — PANTOPRAZOLE SODIUM 40 MG PO TBEC
40.0000 mg | DELAYED_RELEASE_TABLET | Freq: Every day | ORAL | Status: DC
Start: 2021-06-29 — End: 2021-06-30
  Administered 2021-06-29 – 2021-06-30 (×2): 40 mg via ORAL
  Filled 2021-06-29 (×2): qty 1

## 2021-06-29 MED ORDER — ACETAMINOPHEN 325 MG PO TABS
650.0000 mg | ORAL_TABLET | ORAL | Status: DC | PRN
  Administered 2021-06-29 – 2021-06-30 (×3): 650 mg via ORAL
  Filled 2021-06-29 (×3): qty 2

## 2021-06-29 MED ORDER — TIOTROPIUM BROMIDE MONOHYDRATE 18 MCG IN CAPS
18.0000 ug | ORAL_CAPSULE | Freq: Every day | RESPIRATORY_TRACT | Status: DC
  Administered 2021-06-30: 18 ug via RESPIRATORY_TRACT
  Filled 2021-06-29: qty 5

## 2021-06-29 MED ORDER — CARBIDOPA-LEVODOPA 25-100 MG PO TABS
2.0000 | ORAL_TABLET | Freq: Every day | ORAL | Status: DC
  Administered 2021-06-29 – 2021-06-30 (×6): 2 via ORAL
  Filled 2021-06-29 (×7): qty 2

## 2021-06-29 MED ORDER — METOPROLOL TARTRATE 25 MG PO TABS
25.0000 mg | ORAL_TABLET | Freq: Two times a day (BID) | ORAL | Status: DC
  Administered 2021-06-29 – 2021-06-30 (×3): 25 mg via ORAL
  Filled 2021-06-29 (×4): qty 1

## 2021-06-29 NOTE — Care Management CC44 (Signed)
Condition Code 44 Documentation Completed  Patient Details  Name: Patrick Montoya MRN: [id number] Date of Birth: February 20, 1941   Condition Code 44 given:  Yes Patient signature on Condition Code 44 notice:  Yes Documentation of 2 MD's agreement:  Yes Code 44 added to claim:  Yes    Devani Odonnel E Kriste Broman, LCSW 06/29/2021, 4:35 PM

## 2021-06-29 NOTE — Discharge Summary (Signed)
Physician Discharge Summary  Patrick Montoya [id number] DOB: 1940-06-14 DOA: 06/29/2021  PCP: [facility] date: 06/29/2021 Discharge date: 06/29/2021  Discharge disposition: Home   Recommendations for Outpatient Follow-Up:   Follow-up with PCP in 1 week. Follow-up with cardiologist within 2 weeks of discharge   Discharge Diagnosis:   Principal Problem:   Paroxysmal atrial fibrillation with rapid ventricular response (HCC) Active Problems:   COPD (chronic obstructive pulmonary disease) ([place])   Essential (primary) hypertension   Parkinson disease ([place])   Chronic respiratory failure with hypoxia ([place])   Long term (current) use of anticoagulants   History of gastric ulcer    Discharge Condition: Stable.  Diet recommendation:  Diet Order             Diet Heart Room service appropriate? Yes; Fluid consistency: Thin  Diet effective now           Diet - low sodium heart healthy                     Code Status: Full Code     Hospital Course:   [name] is an 81 year old male with medical history significant for paroxysmal atrial fibrillation on Eliquis, COPD, chronic hypoxic respiratory failure on 3 L/min oxygen, Parkinson's disease, migraine headache, hypertension, BPH, peptic ulcer disease, who presented to the hospital because of elevated heart rate, palpitations and chest tightness.  He had undergone a nuclear stress test just a day prior to admission.  He said that when he got home, he used albuterol via nebulizer.  Shortly after that, he developed palpitations and noticed that his heart rate was in the 170s (this was picked up by his Apple Watch).  He was found to have paroxysmal atrial fibrillation with rapid ventricular response.  He was given IV metoprolol.  Subsequently, he converted to normal sinus rhythm.  He complained of migraine headache and was treated with sumatriptan x2 doses.  His condition has improved and he is deemed  stable for discharge to home today.      Discharge Exam:    Vitals:   06/29/21 1200 06/29/21 1321 06/29/21 1430 06/29/21 1607  BP: 100/64 140/83 139/89 (!) 149/97  Pulse: 92 76 79 76  Resp:  17 18 18   Temp:   98.9 F (37.2 C) (!) 97.5 F (36.4 C)  TempSrc:   Oral   SpO2: 98% 97% 99% 98%  Weight:      Height:         GEN: NAD SKIN: No rash EYES: EOMI ENT: MMM CV: RRR PULM: CTA B ABD: soft, ND, NT, +BS CNS: AAO x 3, non focal EXT: No edema or tenderness   The results of significant diagnostics from this hospitalization (including imaging, microbiology, ancillary and laboratory) are listed below for reference.     Procedures and Diagnostic Studies:   DG Chest Portable 1 View  Result Date: 06/29/2021 CLINICAL DATA:  Afib rvr, chest pain, COPD. eval infiltrate EXAM: PORTABLE CHEST 1 VIEW COMPARISON:  Chest x-ray 05/22/2021, CT chest 02/26/2021 FINDINGS: The heart and mediastinal contours are unchanged. Aortic calcification. Bibasilar atelectasis. Biapical pleural/pulmonary scarring. Chronic coarsened interstitial markings with no overt pulmonary edema. No focal consolidation. No pleural effusion. No pneumothorax. No acute osseous abnormality. IMPRESSION: 1. Chronic coarsened interstitial markings with no overt pulmonary edema. No definite focal consolidation. 2. Aortic Atherosclerosis (ICD10-I70.0) and Emphysema (ICD10-J43.9). Electronically Signed   By: Iven Finn M.D.   On: 06/29/2021 01:41  Labs:   Basic Metabolic Panel: Recent Labs  Lab 06/29/21 0114  NA 139  K 3.5  CL 110  CO2 26  GLUCOSE 161*  BUN 16  CREATININE 0.73  CALCIUM 8.6*  MG 1.8   GFR Estimated Creatinine Clearance: 72.4 mL/min (by C-G formula based on SCr of 0.73 mg/dL). Liver Function Tests: Recent Labs  Lab 06/29/21 0114  AST 24  ALT 11  ALKPHOS 71  BILITOT 0.7  PROT 6.7  ALBUMIN 3.5   No results for input(s): LIPASE, AMYLASE in the last 168 hours. No results for  input(s): AMMONIA in the last 168 hours. Coagulation profile No results for input(s): INR, PROTIME in the last 168 hours.  CBC: Recent Labs  Lab 06/29/21 0114  WBC 6.2  NEUTROABS 3.9  HGB 13.5  HCT 41.3  MCV 100.5*  PLT 145*   Cardiac Enzymes: No results for input(s): CKTOTAL, CKMB, CKMBINDEX, TROPONINI in the last 168 hours. BNP: Invalid input(s): POCBNP CBG: No results for input(s): GLUCAP in the last 168 hours. D-Dimer No results for input(s): DDIMER in the last 72 hours. Hgb A1c No results for input(s): HGBA1C in the last 72 hours. Lipid Profile No results for input(s): CHOL, HDL, LDLCALC, TRIG, CHOLHDL, LDLDIRECT in the last 72 hours. Thyroid function studies No results for input(s): TSH, T4TOTAL, T3FREE, THYROIDAB in the last 72 hours.  Invalid input(s): FREET3 Anemia work up No results for input(s): VITAMINB12, FOLATE, FERRITIN, TIBC, IRON, RETICCTPCT in the last 72 hours. Microbiology Recent Results (from the past 240 hour(s))  Resp Panel by RT-PCR (Flu A&B, Covid) Nasopharyngeal Swab     Status: None   Collection Time: 06/29/21  1:17 AM   Specimen: Nasopharyngeal Swab; Nasopharyngeal(NP) swabs in vial transport medium  Result Value Ref Range Status   SARS Coronavirus 2 by RT PCR NEGATIVE NEGATIVE Final    Comment: (NOTE) SARS-CoV-2 target nucleic acids are NOT DETECTED.  The SARS-CoV-2 RNA is generally detectable in upper respiratory specimens during the acute phase of infection. The lowest concentration of SARS-CoV-2 viral copies this assay can detect is 138 copies/mL. A negative result does not preclude SARS-Cov-2 infection and should not be used as the sole basis for treatment or other patient management decisions. A negative result may occur with  improper specimen collection/handling, submission of specimen other than nasopharyngeal swab, presence of viral mutation(s) within the areas targeted by this assay, and inadequate number of viral copies(<138  copies/mL). A negative result must be combined with clinical observations, patient history, and epidemiological information. The expected result is Negative.  Fact Sheet for Patients:  EntrepreneurPulse.com.au  Fact Sheet for Healthcare Providers:  IncredibleEmployment.be  This test is no t yet approved or cleared by the [place] FDA and  has been authorized for detection and/or diagnosis of SARS-CoV-2 by FDA under an Emergency Use Authorization (EUA). This EUA will remain  in effect (meaning this test can be used) for the duration of the COVID-19 declaration under Section 564(b)(1) of the Act, 21 U.S.C.section 360bbb-3(b)(1), unless the authorization is terminated  or revoked sooner.       Influenza A by PCR NEGATIVE NEGATIVE Final   Influenza B by PCR NEGATIVE NEGATIVE Final    Comment: (NOTE) The Xpert Xpress SARS-CoV-2/FLU/RSV plus assay is intended as an aid in the diagnosis of influenza from Nasopharyngeal swab specimens and should not be used as a sole basis for treatment. Nasal washings and aspirates are unacceptable for Xpert Xpress SARS-CoV-2/FLU/RSV testing.  Fact Sheet for Patients: EntrepreneurPulse.com.au  Fact Sheet for Healthcare Providers: IncredibleEmployment.be  This test is not yet approved or cleared by the [place] FDA and has been authorized for detection and/or diagnosis of SARS-CoV-2 by FDA under an Emergency Use Authorization (EUA). This EUA will remain in effect (meaning this test can be used) for the duration of the COVID-19 declaration under Section 564(b)(1) of the Act, 21 U.S.C. section 360bbb-3(b)(1), unless the authorization is terminated or revoked.  Performed at [facility], [address] [place], [place] [address]      Discharge Instructions:   Discharge Instructions     Amb referral to AFIB Clinic   Complete by: As directed    Diet -  low sodium heart healthy   Complete by: As directed    Increase activity slowly   Complete by: As directed       Allergies as of 06/29/2021   No Known Allergies      Medication List     STOP taking these medications    amoxicillin-clavulanate 875-125 MG tablet Commonly known as: AUGMENTIN   aspirin 81 MG tablet   azithromycin 250 MG tablet Commonly known as: ZITHROMAX   Emgality 120 MG/ML Soaj Generic drug: Galcanezumab-gnlm   Myrbetriq 25 MG Tb24 tablet Generic drug: mirabegron ER   potassium chloride SA 20 MEQ tablet Commonly known as: KLOR-CON M   predniSONE 20 MG tablet Commonly known as: DELTASONE   SUMAtriptan 50 MG tablet Commonly known as: IMITREX   Trelegy Ellipta 100-62.5-25 MCG/ACT Aepb Generic drug: Fluticasone-Umeclidin-Vilant       TAKE these medications    albuterol 108 (90 Base) MCG/ACT inhaler Commonly known as: VENTOLIN HFA Inhale 2 puffs into the lungs every 6 (six) hours as needed for wheezing or shortness of breath.   ALPRAZolam 0.5 MG tablet Commonly known as: XANAX Take 0.5 mg by mouth at bedtime as needed for anxiety or sleep.   apixaban 5 MG Tabs tablet Commonly known as: ELIQUIS Take 1 tablet (5 mg total) by mouth 2 (two) times daily.   carbidopa-levodopa 25-100 MG tablet Commonly known as: SINEMET IR Take 2 tablets by mouth 5 (five) times daily.   [employer] Vitamin B-12 2000 MCG Tbcr Generic drug: Cyanocobalamin Take 2,000 mcg by mouth daily.   doxazosin 4 MG tablet Commonly known as: CARDURA Take 4 mg by mouth daily.   ferrous gluconate 324 MG tablet Commonly known as: FERGON Take 324 mg by mouth daily with breakfast.   metoprolol tartrate 25 MG tablet Commonly known as: LOPRESSOR Take 1 tablet (25 mg total) by mouth 2 (two) times daily.   omeprazole 40 MG capsule Commonly known as: PRILOSEC Take 40 mg by mouth daily.   QUEtiapine 50 MG tablet Commonly known as: SEROQUEL Take 50 mg by mouth at bedtime.    rizatriptan 10 MG tablet Commonly known as: MAXALT Take 10 mg by mouth as needed for migraine. May repeat in 2 hours if needed   tiotropium 18 MCG inhalation capsule Commonly known as: SPIRIVA Place 18 mcg into inhaler and inhale daily.           If you experience worsening of your admission symptoms, develop shortness of breath, life threatening emergency, suicidal or homicidal thoughts you must seek medical attention immediately by calling 911 or calling your MD immediately  if symptoms less severe.   You must read complete instructions/literature along with all the possible adverse reactions/side effects for all the medicines you take and that have been prescribed to you. Take any  new medicines after you have completely understood and accept all the possible adverse reactions/side effects.    Please note   You were cared for by a hospitalist during your hospital stay. If you have any questions about your discharge medications or the care you received while you were in the hospital after you are discharged, you can call the unit and asked to speak with the hospitalist on call if the hospitalist that took care of you is not available. Once you are discharged, your primary care physician will handle any further medical issues. Please note that NO REFILLS for any discharge medications will be authorized once you are discharged, as it is imperative that you return to your primary care physician (or establish a relationship with a primary care physician if you do not have one) for your aftercare needs so that they can reassess your need for medications and monitor your lab values.       Time coordinating discharge: Greater than 30 minutes  Signed:  Abrian Hanover  [facility] 06/29/2021, 4:31 PM   Pager on [email or web address]. If 7PM-7AM, please contact night-coverage at [email or web address]

## 2021-06-29 NOTE — Assessment & Plan Note (Signed)
Received IV metoprolol in the ED with improvement in rate from the 160s to under 110 Continue home metoprolol 25 twice daily Continue home Eliquis Cardiology consult

## 2021-06-29 NOTE — Assessment & Plan Note (Signed)
Continue Sinemet ?

## 2021-06-29 NOTE — Assessment & Plan Note (Signed)
-   Continue Eliquis 

## 2021-06-29 NOTE — Assessment & Plan Note (Signed)
No acute issues.

## 2021-06-29 NOTE — H&P (Signed)
History and Physical    Patient: Patrick Montoya [id number] DOB: 05-27-1940 DOA: 06/29/2021 DOS: the patient was seen and examined on 06/29/2021 PCP: [place]  Patient coming from: Home  Chief Complaint:  Chief Complaint  Patient presents with   Chest Pain    HPI: Patrick Montoya is a 81 y.o. male with medical history significant of Paroxysmal atrial fibrillation on Eliquis, COPD on home O2 at 2 to 3 L, Parkinson's disease, HTN, BPH, PUD who presents to the ED with sudden onset palpitations and chest tightness with heart rate 160-175 when checked with Apple Watch at home.  Patient states he was in his usual state of health and had a stress test earlier in the day and was doing well however on the night of arrival after self administering his routine albuterol treatment, he started experiencing chest tightness and palpitations.  He denied nausea, vomiting or diaphoresis.  Denies cough, fever or chills  ED course: On arrival, heart rate 152 with tachypnea of 22, BP 108/77, O2 sat 94% on home flow rate of 3 L Blood work: Troponin 25, BNP 45 CBC and CMP unremarkable.  Magnesium 1.8  EKG, personally viewed and interpreted: Rapid A-fib at 163 with no acute ST-T wave changes  Chest x-ray with chronic coarsening interstitial markings and no overt pulmonary edema.  No focal consolidation  Patient treated with IV metoprolol 5 mg x 1 dose with improvement in heart rate from the 140s to the 1 teens.  He was also given an IV fluid bolus.  Hospitalist consulted for admission.   Review of Systems: As mentioned in the history of present illness. All other systems reviewed and are negative. Past Medical History:  Diagnosis Date   Basal cell carcinoma 07/25/2015   L zygoma inf to lat canthus   Basal cell carcinoma 06/11/2018   L nasal ala   BPH (benign prostatic hyperplasia)    COPD (chronic obstructive pulmonary disease) (HCC)    Elevated liver enzymes    Emphysema of lung (HCC)     Gastric ulcer    GERD (gastroesophageal reflux disease)    Hepatitis B    Hypertension    Parkinson's disease (HCC)    Prostate hypertrophy    Rotator cuff tear right   UTI (urinary tract infection)    Past Surgical History:  Procedure Laterality Date   CHOLECYSTECTOMY     COLONOSCOPY WITH PROPOFOL N/A 10/20/2015   Procedure: COLONOSCOPY WITH PROPOFOL;  Surgeon: Manya Silvas, MD;  Location[facility] ENDOSCOPY;  Service: Endoscopy;  Laterality: N/A;   COLONOSCOPY WITH PROPOFOL N/A 01/24/2021   Procedure: COLONOSCOPY WITH PROPOFOL;  Surgeon: Toledo, Benay Pike, MD;  Location: [facility] ENDOSCOPY;  Service: Gastroenterology;  Laterality: N/A;   ESOPHAGOGASTRODUODENOSCOPY (EGD) WITH PROPOFOL N/A 10/20/2015   Procedure: ESOPHAGOGASTRODUODENOSCOPY (EGD) WITH PROPOFOL;  Surgeon: Manya Silvas, MD;  Location: [facility] ENDOSCOPY;  Service: Endoscopy;  Laterality: N/A;   GREEN LIGHT LASER TURP (TRANSURETHRAL RESECTION OF PROSTATE N/A 11/17/2018   Procedure: GREEN LIGHT LASER TURP (TRANSURETHRAL RESECTION OF PROSTATE;  Surgeon: Royston Cowper, MD;  Location: [facility] ORS;  Service: Urology;  Laterality: N/A;   INGUINAL HERNIA REPAIR Right 02/18/2019   Procedure: HERNIA REPAIR INGUINAL ADULT;  Surgeon: Royston Cowper, MD;  Location: [facility] ORS;  Service: Urology;  Laterality: Right;   INSERTION OF MESH Right 02/18/2019   Procedure: INSERTION OF MESH;  Surgeon: Royston Cowper, MD;  Location: [facility] ORS;  Service: Urology;  Laterality: Right;   TONSILLECTOMY  TRANSURETHRAL MICROWAVE THERAPY     Social History:  reports that he has quit smoking. His smoking use included cigarettes. He has a 62.00 pack-year smoking history. He has never used smokeless tobacco. He reports current alcohol use. He reports that he does not use drugs.  No Known Allergies  Family History  Problem Relation Age of Onset   Colon cancer Mother    Hepatitis Son    Prostate cancer Neg Hx    Kidney cancer Neg Hx     Prior to Admission  medications   Medication Sig Start Date End Date Taking? Authorizing Provider  albuterol (PROVENTIL HFA;VENTOLIN HFA) 108 (90 Base) MCG/ACT inhaler Inhale 2 puffs into the lungs every 6 (six) hours as needed for wheezing or shortness of breath.    [provider]  ALPRAZolam Duanne Moron) 0.5 MG tablet Take 0.5 mg by mouth at bedtime as needed for anxiety or sleep.    [provider]  amoxicillin-clavulanate (AUGMENTIN) 875-125 MG tablet Take 1 tablet by mouth every 12 (twelve) hours. 05/23/21   Caccavale, Sophia, PA-C  apixaban (ELIQUIS) 5 MG TABS tablet Take 1 tablet (5 mg total) by mouth 2 (two) times daily. 03/10/21 07/08/21  Val Riles, MD  aspirin 81 MG tablet Take 81 mg by mouth daily.      [provider]  azithromycin (ZITHROMAX) 250 MG tablet Take 1 tablet (250 mg total) by mouth daily. Take first 2 tablets together, then 1 every day until finished. 05/23/21   Caccavale, Sophia, PA-C  carbidopa-levodopa (SINEMET IR) 25-100 MG tablet Take 2 tablets by mouth 5 (five) times daily.    [provider]  Cyanocobalamin ([employer] VITAMIN B-12) 2000 MCG TBCR Take 2,000 mcg by mouth daily.    [provider]  doxazosin (CARDURA) 4 MG tablet Take 4 mg by mouth daily.     [provider]  ferrous gluconate (FERGON) 324 MG tablet Take 324 mg by mouth daily with breakfast.    [provider]  Fluticasone-Umeclidin-Vilant (TRELEGY ELLIPTA) 100-62.5-25 MCG/INH AEPB Inhale 1 puff into the lungs daily. 06/16/20   [provider]  metoprolol tartrate (LOPRESSOR) 25 MG tablet Take 1 tablet (25 mg total) by mouth 2 (two) times daily. 03/10/21 04/09/21  Val Riles, MD  omeprazole (PRILOSEC) 40 MG capsule Take 40 mg by mouth daily.  11/06/15   [provider]  potassium chloride SA (KLOR-CON) 20 MEQ tablet Take 20 mEq by mouth daily.    [provider]  QUEtiapine (SEROQUEL) 50 MG tablet Take 50 mg by mouth at bedtime.    [provider]  rizatriptan (MAXALT) 10 MG tablet Take 10 mg by mouth as needed for migraine. May repeat in 2 hours if needed    [provider]  SUMAtriptan (IMITREX) 50 MG tablet Take 50 mg by mouth every 2 (two) hours as needed for migraine (max 2 tablets/24hrs). May repeat in 2 hours if headache persists or recurs.    [provider]  tiotropium (SPIRIVA) 18 MCG inhalation capsule Place 18 mcg into inhaler and inhale daily.    [provider]    Physical Exam: Vitals:   06/29/21 0105 06/29/21 0107 06/29/21 0130 06/29/21 0200  BP: 108/77  94/81 (!) 77/54  Pulse: (!) 145  (!) 132 (!) 105  Resp: (!) 22  16 20   Temp: 97.8 F (36.6 C)     TempSrc: Oral     SpO2: 94%  94% 95%  Weight:  77.1 kg  Height:  5\' 9"  (1.753 m)     Physical Exam Vitals and nursing note reviewed.  Constitutional:      General: He is not in acute distress.    Appearance: Normal appearance.  HENT:     Head: Normocephalic and atraumatic.  Cardiovascular:     Rate and Rhythm: Tachycardia present. Rhythm irregular.     Pulses: Normal pulses.     Heart sounds: Normal heart sounds. No murmur heard. Pulmonary:     Effort: Pulmonary effort is normal.     Breath sounds: Normal breath sounds. No wheezing or rhonchi.  Abdominal:     General: Bowel sounds are normal.     Palpations: Abdomen is soft.     Tenderness: There is no abdominal tenderness.  Musculoskeletal:        General: No swelling or tenderness. Normal range of motion.     Cervical back: Normal range of motion and neck supple.  Skin:    General: Skin is warm and dry.  Neurological:     General: No focal deficit present.     Mental Status: He is alert. Mental status is at baseline.  Psychiatric:        Mood and Affect: Mood normal.        Behavior: Behavior normal.     Data Reviewed: Notes from primary care and specialist visits, past discharge summaries. Prior diagnostic testing as applicable to current admission  diagnoses Updated medications and problem lists for reconciliation ED course, including vitals, labs, imaging, treatment and response to treatment Triage notes and ED providers notes   Assessment and Plan: * Paroxysmal atrial fibrillation with rapid ventricular response (HCC) Received IV metoprolol in the ED with improvement in rate from the 160s to under 110 Continue home metoprolol 25 twice daily Continue home Eliquis Cardiology consult  Long term (current) use of anticoagulants Continue Eliquis  Chronic respiratory failure with hypoxia ([place]) Patient at baseline requirement Continue home O2 at 2 to 3 L  Parkinson disease ([place])- (present on admission) Continue Sinemet  History of gastric ulcer No acute issues  Essential (primary) hypertension- (present on admission) Continue metoprolol  COPD (chronic obstructive pulmonary disease) ([place])- (present on admission) Not acutely exacerbated Albuterol as needed       Advance Care Planning:   Code Status: Full Code   Consults: cardiology  Family Communication: none  Severity of Illness: The appropriate patient status for this patient is INPATIENT. Inpatient status is judged to be reasonable and necessary in order to provide the required intensity of service to ensure the patient's safety. The patient's presenting symptoms, physical exam findings, and initial radiographic and laboratory data in the context of their chronic comorbidities is felt to place them at high risk for further clinical deterioration. Furthermore, it is not anticipated that the patient will be medically stable for discharge from the hospital within 2 midnights of admission.   * I certify that at the point of admission it is my clinical judgment that the patient will require inpatient hospital care spanning beyond 2 midnights from the point of admission due to high intensity of service, high risk for further deterioration and high frequency of surveillance  required.*  Author: Athena Masse, MD 06/29/2021 2:40 AM  For on call review [email or web address].

## 2021-06-29 NOTE — ED Triage Notes (Signed)
Pt comes from home via [facility]. EMS reports pts watch alerted him that HR was in 180s and pt felt sharp pain in center of chest. Pt has hx of afib. Pt on 3L O2 baseline. 324mg  of baby aspirin given by EMS.

## 2021-06-29 NOTE — ED Notes (Signed)
RN aware bed assigned ?

## 2021-06-29 NOTE — Assessment & Plan Note (Signed)
Patient at baseline requirement Continue home O2 at 2 to 3 L

## 2021-06-29 NOTE — Assessment & Plan Note (Signed)
Not acutely exacerbated.  Albuterol as needed 

## 2021-06-29 NOTE — Assessment & Plan Note (Signed)
Continue metoprolol. 

## 2021-06-29 NOTE — ED Notes (Signed)
Pt sleeping at this time. NAD noted, respirations even and unlabored. °

## 2021-06-29 NOTE — ED Provider Notes (Signed)
[facility] Provider Note    Event Date/Time   First MD Initiated Contact with Patient 06/29/21 0102     (approximate)   History   Chest Pain   HPI  Patrick Montoya is a 81 y.o. male who presents to the ED for evaluation of Chest Pain   I reviewed cardiology consult from 11/1.  [facility].  History of COPD and chronic respiratory failure.  Paroxysmal atrial fibrillation on Eliquis.  Patient presents to the ED for evaluation of chest pain, palpitations and rapid heart rates at home.  He reports that he was seated this evening when he suddenly developed sharp and pressure-like chest discomfort to his substernal chest with associated palpitations.  He checked on a pulse oximeter and had a heart rate in the 180s and so 911 was called.  He took 324 of aspirin prior to arrival.  Here in the ED, patient reports some residual chest discomfort, although improved from previous.  Reports his breathing feels okay and denies recent illnesses, falls or syncopal episodes.  Physical Exam   Triage Vital Signs: ED Triage Vitals  Enc Vitals Group     BP      Pulse      Resp      Temp      Temp src      SpO2      Weight      Height      Head Circumference      Peak Flow      Pain Score      Pain Loc      Pain Edu?      Excl. in [place]?     Most recent vital signs: Vitals:   06/29/21 0130 06/29/21 0200  BP: 94/81 (!) 77/54  Pulse: (!) 132 (!) 105  Resp: 16 20  Temp:    SpO2: 94% 95%    General: Awake, no distress.  CV:  Good peripheral perfusion.  Tachycardic and irregular. Resp:  Normal effort.  Good airflow throughout, minimal and scattered intermittent expiratory wheezes. Abd:  No distention.  Soft and benign MSK:  No deformity noted.  No evidence of volume overload Neuro:  No focal deficits appreciated. Other:     ED Results / Procedures / Treatments   Labs (all labs ordered are listed, but only abnormal results are displayed) Labs Reviewed   COMPREHENSIVE METABOLIC PANEL - Abnormal; Notable for the following components:      Result Value   Glucose, Bld 161 (*)    Calcium 8.6 (*)    Anion gap 3 (*)    All other components within normal limits  CBC WITH DIFFERENTIAL/PLATELET - Abnormal; Notable for the following components:   RBC 4.11 (*)    MCV 100.5 (*)    Platelets 145 (*)    All other components within normal limits  TROPONIN I (HIGH SENSITIVITY) - Abnormal; Notable for the following components:   Troponin I (High Sensitivity) 25 (*)    All other components within normal limits  RESP PANEL BY RT-PCR (FLU A&B, COVID) [id number]  BRAIN NATRIURETIC PEPTIDE  MAGNESIUM    EKG A-fib with RVR, rate of 163 bpm.  Normal axis and intervals.  No STEMI.  Mild ST depressions laterally, possibly rate related.  RADIOLOGY CXR reviewed by me without evidence of acute cardiopulmonary pathology.   Official radiology report(s): DG Chest Portable 1 View  Result Date: 06/29/2021 CLINICAL DATA:  Afib rvr, chest pain, COPD. eval infiltrate  EXAM: PORTABLE CHEST 1 VIEW COMPARISON:  Chest x-ray 05/22/2021, CT chest 02/26/2021 FINDINGS: The heart and mediastinal contours are unchanged. Aortic calcification. Bibasilar atelectasis. Biapical pleural/pulmonary scarring. Chronic coarsened interstitial markings with no overt pulmonary edema. No focal consolidation. No pleural effusion. No pneumothorax. No acute osseous abnormality. IMPRESSION: 1. Chronic coarsened interstitial markings with no overt pulmonary edema. No definite focal consolidation. 2. Aortic Atherosclerosis (ICD10-I70.0) and Emphysema (ICD10-J43.9). Electronically Signed   By: Iven Finn M.D.   On: 06/29/2021 01:41    PROCEDURES and INTERVENTIONS:  .1-3 Lead EKG Interpretation Performed by: Vladimir Crofts, MD Authorized by: Vladimir Crofts, MD     Interpretation: abnormal     ECG rate:  110   ECG rate assessment: tachycardic     Rhythm: atrial fibrillation     Ectopy: none      Conduction: normal   .Critical Care Performed by: Vladimir Crofts, MD Authorized by: Vladimir Crofts, MD   Critical care provider statement:    Critical care time (minutes):  30   Critical care time was exclusive of:  Separately billable procedures and treating other patients   Critical care was necessary to treat or prevent imminent or life-threatening deterioration of the following conditions:  Circulatory failure and cardiac failure   Critical care was time spent personally by me on the following activities:  Development of treatment plan with patient or surrogate, discussions with consultants, evaluation of patient's response to treatment, examination of patient, ordering and review of laboratory studies, ordering and review of radiographic studies, ordering and performing treatments and interventions, pulse oximetry, re-evaluation of patient's condition and review of old charts  Medications  metoprolol tartrate (LOPRESSOR) injection 5 mg (5 mg Intravenous Given 06/29/21 0115)  sodium chloride 0.9 % bolus 500 mL (500 mLs Intravenous New Bag/Given 06/29/21 0149)     IMPRESSION / MDM / [place] / ED COURSE  I reviewed the triage vital signs and the nursing notes.  81 year old male presents to the ED with chest pain and tachycardia, with evidence of A-fib with RVR requiring medical observation admission.  He presents with rapid rates of A-fib in the 140s, but remains hemodynamically stable and not hypoxic on his home oxygen.  Improvement of his rates with IV metoprolol and subsequent resolution of his chest pain.  His EKG is without ischemic features, just rapid rates of A-fib.  His first troponin is slightly elevated, which may be rate related but ACS is a possibility and we will continue to trend his troponins.  CBC is unremarkable and metabolic panel with normal electrolytes.  No evidence of volume overload clinically and no elevation of BNP.  CXR with chronic changes without acute  infiltrate or PTX.  Has somewhat soft blood pressure after IV metoprolol, but remains clinically well-appearing.  IV fluids ongoing.  Due to his continued rapid rates and symptomatic A-fib, we will consult with medicine for observation admission.  Clinical Course as of 06/29/21 0228  Fri Jun 29, 2021  0206 Reassessed.  Feeling better.  Heart rates improving to the low 100s.  His blood pressures have softened out a little bit so he is getting some IV fluids as well.  Does not appear grossly volume overloaded. [DS]  0206 Dr. Damita Dunnings is in the ED for another admission and I consult with her about this patient and she agrees to see for admission. [DS]    Clinical Course User Index [DS] Vladimir Crofts, MD     FINAL CLINICAL IMPRESSION(S) / ED DIAGNOSES  Final diagnoses:  Atrial fibrillation with RVR ([place])  Other chest pain     Rx / DC Orders   ED Discharge Orders     None        Note:  This document was prepared using Dragon voice recognition software and may include unintentional dictation errors.   Vladimir Crofts, MD 06/29/21 0230

## 2021-06-29 NOTE — ED Notes (Signed)
RN to hold BP medication currently due to patient BP being low while sleeping.  Re-eval to be completed in an hour.

## 2021-06-29 NOTE — Consult Note (Signed)
[facility] Cardiology Consultation Note  Patient ID: Patrick Montoya, MRN: [id number], DOB/AGE: 05-19-1940 81 y.o. Admit date: 06/29/2021   Date of Consult: 06/29/2021 Primary Physician: [place] Primary Cardiologist: orgel  Chief Complaint:  Chief Complaint  Patient presents with   Chest Pain   Reason for Consult:  Atrial fibrillation  HPI: 81 y.o. male hypertension COPD who has had intermittent paroxysmal nonvalvular atrial fibrillation since his last year.  The patient has been on anticoagulation and low-dose metoprolol.  He has had multiple episodes where he is at a rapid rate for which has been concerning.  The patient does spontaneously convert to normal rhythm with other medication management.  He has had a recent stress test with no evidence of significant ischemic issues and no evidence of LV systolic dysfunction.  After discussion with the patient he may need further medication management including antiarrhythmics to reduce possible hospitalization although currently he feels comfortable with his current medication management.  There is no evidence of acute coronary syndrome or congestive heart failure  Past Medical History:  Diagnosis Date   Basal cell carcinoma 07/25/2015   L zygoma inf to lat canthus   Basal cell carcinoma 06/11/2018   L nasal ala   BPH (benign prostatic hyperplasia)    COPD (chronic obstructive pulmonary disease) (HCC)    Elevated liver enzymes    Emphysema of lung (HCC)    Gastric ulcer    GERD (gastroesophageal reflux disease)    Hepatitis B    Hypertension    Parkinson's disease (HCC)    Prostate hypertrophy    Rotator cuff tear right   UTI (urinary tract infection)       Surgical History:  Past Surgical History:  Procedure Laterality Date   CHOLECYSTECTOMY     COLONOSCOPY WITH PROPOFOL N/A 10/20/2015   Procedure: COLONOSCOPY WITH PROPOFOL;  Surgeon: Manya Silvas, MD;  Location: [facility] ENDOSCOPY;  Service: Endoscopy;   Laterality: N/A;   COLONOSCOPY WITH PROPOFOL N/A 01/24/2021   Procedure: COLONOSCOPY WITH PROPOFOL;  Surgeon: Toledo, Benay Pike, MD;  Location: [facility] ENDOSCOPY;  Service: Gastroenterology;  Laterality: N/A;   ESOPHAGOGASTRODUODENOSCOPY (EGD) WITH PROPOFOL N/A 10/20/2015   Procedure: ESOPHAGOGASTRODUODENOSCOPY (EGD) WITH PROPOFOL;  Surgeon: Manya Silvas, MD;  Location: [facility] ENDOSCOPY;  Service: Endoscopy;  Laterality: N/A;   GREEN LIGHT LASER TURP (TRANSURETHRAL RESECTION OF PROSTATE N/A 11/17/2018   Procedure: GREEN LIGHT LASER TURP (TRANSURETHRAL RESECTION OF PROSTATE;  Surgeon: Royston Cowper, MD;  Location: [facility] ORS;  Service: Urology;  Laterality: N/A;   INGUINAL HERNIA REPAIR Right 02/18/2019   Procedure: HERNIA REPAIR INGUINAL ADULT;  Surgeon: Royston Cowper, MD;  Location: [facility] ORS;  Service: Urology;  Laterality: Right;   INSERTION OF MESH Right 02/18/2019   Procedure: INSERTION OF MESH;  Surgeon: Royston Cowper, MD;  Location: [facility] ORS;  Service: Urology;  Laterality: Right;   TONSILLECTOMY     TRANSURETHRAL MICROWAVE THERAPY       Home Meds: Prior to Admission medications   Medication Sig Start Date End Date Taking? Authorizing Provider  ALPRAZolam Duanne Moron) 0.5 MG tablet Take 0.5 mg by mouth at bedtime as needed for anxiety or sleep.   Yes [provider]  apixaban (ELIQUIS) 5 MG TABS tablet Take 1 tablet (5 mg total) by mouth 2 (two) times daily. 03/10/21 07/08/21 Yes Val Riles, MD  carbidopa-levodopa (SINEMET IR) 25-100 MG tablet Take 2 tablets by mouth 5 (five) times daily.   Yes [provider]  Cyanocobalamin ([employer] VITAMIN B-12) 2000 MCG TBCR Take 2,000 mcg by mouth daily.   Yes [provider]  doxazosin (CARDURA) 4 MG tablet Take 4 mg by mouth daily.    Yes [provider]  ferrous gluconate (FERGON) 324 MG tablet Take 324 mg by mouth daily with breakfast.   Yes [provider]  metoprolol tartrate (LOPRESSOR) 25 MG tablet Take  1 tablet (25 mg total) by mouth 2 (two) times daily. 03/10/21 06/29/21 Yes Val Riles, MD  omeprazole (PRILOSEC) 40 MG capsule Take 40 mg by mouth daily.  11/06/15  Yes [provider]  QUEtiapine (SEROQUEL) 50 MG tablet Take 50 mg by mouth at bedtime.   Yes [provider]  tiotropium (SPIRIVA) 18 MCG inhalation capsule Place 18 mcg into inhaler and inhale daily.   Yes [provider]  albuterol (PROVENTIL HFA;VENTOLIN HFA) 108 (90 Base) MCG/ACT inhaler Inhale 2 puffs into the lungs every 6 (six) hours as needed for wheezing or shortness of breath.    [provider]  amoxicillin-clavulanate (AUGMENTIN) 875-125 MG tablet Take 1 tablet by mouth every 12 (twelve) hours. Patient not taking: Reported on 06/29/2021 05/23/21   Caccavale, Sophia, PA-C  aspirin 81 MG tablet Take 81 mg by mouth daily.   Patient not taking: Reported on 06/29/2021    [provider]  azithromycin (ZITHROMAX) 250 MG tablet Take 1 tablet (250 mg total) by mouth daily. Take first 2 tablets together, then 1 every day until finished. Patient not taking: Reported on 06/29/2021 05/23/21   Caccavale, Sophia, PA-C  EMGALITY 120 MG/ML SOAJ Inject into the skin. Patient not taking: Reported on 06/29/2021 06/22/21   [provider]  Fluticasone-Umeclidin-Vilant (TRELEGY ELLIPTA) 100-62.5-25 MCG/INH AEPB Inhale 1 puff into the lungs daily. Patient not taking: Reported on 06/29/2021 06/16/20   [provider]  MYRBETRIQ 25 MG TB24 tablet Take 25 mg by mouth daily. Patient not taking: Reported on 06/29/2021 05/14/21   [provider]  potassium chloride SA (KLOR-CON M) 20 MEQ tablet Take 20 mEq by mouth daily. Patient not taking: Reported on 06/29/2021    [provider]  predniSONE (DELTASONE) 20 MG tablet Take 20 mg by mouth 2 (two) times daily. Patient not taking: Reported on 06/29/2021 05/24/21   [provider]  rizatriptan (MAXALT) 10 MG tablet Take 10 mg  by mouth as needed for migraine. May repeat in 2 hours if needed    [provider]  SUMAtriptan (IMITREX) 50 MG tablet Take 50 mg by mouth every 2 (two) hours as needed for migraine (max 2 tablets/24hrs). May repeat in 2 hours if headache persists or recurs.    [provider]    Inpatient Medications:   apixaban  5 mg Oral BID   carbidopa-levodopa  2 tablet Oral 5 X Daily   metoprolol tartrate  25 mg Oral BID   pantoprazole  40 mg Oral Daily   QUEtiapine  50 mg Oral QHS   [START ON 06/30/2021] tiotropium  18 mcg Inhalation Daily     Allergies: No Known Allergies  Social History   Socioeconomic History   Marital status: Married    Spouse name: Not on file   Number of children: Not on file   Years of education: Not on file   Highest education level: Not on file  Occupational History   Not on file  Tobacco Use   Smoking status: Former    Packs/day: 1.00    Years: 62.00  Pack years: 62.00    Types: Cigarettes   Smokeless tobacco: Never  Vaping Use   Vaping Use: Never used  Substance and Sexual Activity   Alcohol use: Yes    Comment: occasional   Drug use: No   Sexual activity: Not on file  Other Topics Concern   Not on file  Social History Narrative   Not on file   Social Determinants of Health   Financial Resource Strain: Not on file  Food Insecurity: Not on file  Transportation Needs: Not on file  Physical Activity: Not on file  Stress: Not on file  Social Connections: Not on file  Intimate Partner Violence: Not on file     Family History  Problem Relation Age of Onset   Colon cancer Mother    Hepatitis Son    Prostate cancer Neg Hx    Kidney cancer Neg Hx      Review of Systems Positive for palpitations Negative for: General:  chills, fever, night sweats or weight changes.  Cardiovascular: PND orthopnea syncope dizziness  Dermatological skin lesions rashes Respiratory: Cough congestion Urologic: Frequent urination urination at  night and hematuria Abdominal: negative for nausea, vomiting, diarrhea, bright red blood per rectum, melena, or hematemesis Neurologic: negative for visual changes, and/or hearing changes  All other systems reviewed and are otherwise negative except as noted above.  Labs: No results for input(s): CKTOTAL, CKMB, TROPONINI in the last 72 hours. Lab Results  Component Value Date   WBC 6.2 06/29/2021   HGB 13.5 06/29/2021   HCT 41.3 06/29/2021   MCV 100.5 (H) 06/29/2021   PLT 145 (L) 06/29/2021    Recent Labs  Lab 06/29/21 0114  NA 139  K 3.5  CL 110  CO2 26  BUN 16  CREATININE 0.73  CALCIUM 8.6*  PROT 6.7  BILITOT 0.7  ALKPHOS 71  ALT 11  AST 24  GLUCOSE 161*   No results found for: CHOL, HDL, LDLCALC, TRIG Lab Results  Component Value Date   DDIMER 0.73 (H) 02/26/2021    Radiology/Studies:  DG Chest Portable 1 View  Result Date: 06/29/2021 CLINICAL DATA:  Afib rvr, chest pain, COPD. eval infiltrate EXAM: PORTABLE CHEST 1 VIEW COMPARISON:  Chest x-ray 05/22/2021, CT chest 02/26/2021 FINDINGS: The heart and mediastinal contours are unchanged. Aortic calcification. Bibasilar atelectasis. Biapical pleural/pulmonary scarring. Chronic coarsened interstitial markings with no overt pulmonary edema. No focal consolidation. No pleural effusion. No pneumothorax. No acute osseous abnormality. IMPRESSION: 1. Chronic coarsened interstitial markings with no overt pulmonary edema. No definite focal consolidation. 2. Aortic Atherosclerosis (ICD10-I70.0) and Emphysema (ICD10-J43.9). Electronically Signed   By: Iven Finn M.D.   On: 06/29/2021 01:41    EKG: Normal sinus rhythm  Weights: Filed Weights   06/29/21 0107  Weight: 77.1 kg     Physical Exam: Blood pressure (!) 149/97, pulse 76, temperature (!) 97.5 F (36.4 C), resp. rate 18, height 5\' 9"  (1.753 m), weight 77.1 kg, SpO2 98 %. Body mass index is 25.1 kg/m. General: Well developed, well nourished, in no acute  distress. Head eyes ears nose throat: Normocephalic, atraumatic, sclera non-icteric, no xanthomas, nares are without discharge. No apparent thyromegaly and/or mass  Lungs: Normal respiratory effort.  no wheezes, no rales, no rhonchi.  Heart: RRR with normal S1 S2. no murmur gallop, no rub, PMI is normal size and placement, carotid upstroke normal without bruit, jugular venous pressure is normal Abdomen: Soft, non-tender, non-distended with normoactive bowel sounds. No hepatomegaly. No rebound/guarding. No obvious  abdominal masses. Abdominal aorta is normal size without bruit Extremities: No edema. no cyanosis, no clubbing, no ulcers  Peripheral : 2+ bilateral upper extremity pulses, 2+ bilateral femoral pulses, 2+ bilateral dorsal pedal pulse Neuro: Alert and oriented. No facial asymmetry. No focal deficit. Moves all extremities spontaneously. Musculoskeletal: Normal muscle tone without kyphosis Psych:  Responds to questions appropriately with a normal affect.    Assessment: 81 year old male with paroxysmal nonvalvular atrial fibrillation rapid ventricular rate without evidence of congestive heart failure or myocardial ischemia or acute coronary syndrome now spontaneously converted to normal rhythm feeling fine  Plan: 1.  Continue metoprolol for management heart rate control blood pressure control and maintenance of normal sinus rhythm 2.  Would consider as an outpatient to add rhythm medication management for maintenance of normal rhythm 3.  Continue anticoagulation for further risk reduction stroke with atrial fibrillation 4.  No further cardiac diagnostics necessary at this time 5.  Okay for discharge to home with follow-up next week  Signed, Corey Skains M.D. [facility] 06/29/2021, 4:48 PM

## 2021-06-30 DIAGNOSIS — J9611 Chronic respiratory failure with hypoxia: Secondary | ICD-10-CM | POA: Diagnosis not present

## 2021-06-30 DIAGNOSIS — I480 Paroxysmal atrial fibrillation: Secondary | ICD-10-CM | POA: Diagnosis not present

## 2021-06-30 NOTE — Discharge Summary (Addendum)
Physician Discharge Summary  TRUTH BAROT OAC:[id number] DOB: [birth year] DOA: 06/29/2021  PCP: [place] date: 06/29/2021 Discharge date: 06/30/2021  Discharge disposition: Home   Recommendations for Outpatient Follow-Up:   Follow-up with PCP in 1 week. Follow-up with cardiologist within 2 weeks of discharge   Discharge Diagnosis:   Principal Problem:   Paroxysmal atrial fibrillation with rapid ventricular response (HCC) Active Problems:   COPD (chronic obstructive pulmonary disease) ([place])   Essential (primary) hypertension   Parkinson disease ([place])   Chronic respiratory failure with hypoxia ([place])   Long term (current) use of anticoagulants   History of gastric ulcer   Rapid atrial fibrillation ([place])    Discharge Condition: Stable.  Diet recommendation:  Diet Order             Diet Heart Room service appropriate? Yes; Fluid consistency: Thin  Diet effective now           Diet - low sodium heart healthy                     Code Status: Full Code     Hospital Course:     [name] is an 81 year old male with medical history significant for paroxysmal atrial fibrillation on Eliquis, COPD, chronic hypoxic respiratory failure on 3 L/min oxygen, Parkinson's disease, migraine headache, hypertension, BPH, peptic ulcer disease, who presented to the hospital because of elevated heart rate, palpitations and chest tightness.  He had undergone a nuclear stress test just a day prior to admission.  He said that when he got home, he used albuterol via nebulizer.  Shortly after that, he developed palpitations and noticed that his heart rate was in the 170s (this was picked up by his Apple Watch).   He was found to have paroxysmal atrial fibrillation with rapid ventricular response.  He was given IV metoprolol.  Subsequently, he converted to normal sinus rhythm.  He complained of migraine headache and was treated with sumatriptan.  He had to be kept  in the hospital for 1 more night because he developed shortness of breath and wheezing which required additional observation overnight.  His condition has improved and he is deemed stable for discharge to home today.      Medical Consultants:   Cardiologist   Discharge Exam:    Vitals:   06/29/21 1430 06/29/21 1607 06/30/21 0423 06/30/21 0802  BP: 139/89 (!) 149/97 128/69 (!) 108/46  Pulse: 79 76 (!) 50 75  Resp: 18 18 16 18   Temp: 98.9 F (37.2 C) (!) 97.5 F (36.4 C) 97.9 F (36.6 C) 98.1 F (36.7 C)  TempSrc: Oral  Oral   SpO2: 99% 98% 97% 93%  Weight:      Height:         GEN: NAD SKIN: No rash EYES: EOMI ENT: MMM CV: RRR PULM: CTA B ABD: soft, ND, NT, +BS CNS: AAO x 3, non focal EXT: No edema or tenderness   The results of significant diagnostics from this hospitalization (including imaging, microbiology, ancillary and laboratory) are listed below for reference.     Procedures and Diagnostic Studies:   DG Chest Portable 1 View  Result Date: 06/29/2021 CLINICAL DATA:  Afib rvr, chest pain, COPD. eval infiltrate EXAM: PORTABLE CHEST 1 VIEW COMPARISON:  Chest x-ray 05/22/2021, CT chest 02/26/2021 FINDINGS: The heart and mediastinal contours are unchanged. Aortic calcification. Bibasilar atelectasis. Biapical pleural/pulmonary scarring. Chronic coarsened interstitial markings with no overt pulmonary edema. No  focal consolidation. No pleural effusion. No pneumothorax. No acute osseous abnormality. IMPRESSION: 1. Chronic coarsened interstitial markings with no overt pulmonary edema. No definite focal consolidation. 2. Aortic Atherosclerosis (ICD10-I70.0) and Emphysema (ICD10-J43.9). Electronically Signed   By: Iven Finn M.D.   On: 06/29/2021 01:41     Labs:   Basic Metabolic Panel: Recent Labs  Lab 06/29/21 0114  NA 139  K 3.5  CL 110  CO2 26  GLUCOSE 161*  BUN 16  CREATININE 0.73  CALCIUM 8.6*  MG 1.8   GFR Estimated Creatinine Clearance:  72.4 mL/min (by C-G formula based on SCr of 0.73 mg/dL). Liver Function Tests: Recent Labs  Lab 06/29/21 0114  AST 24  ALT 11  ALKPHOS 71  BILITOT 0.7  PROT 6.7  ALBUMIN 3.5   No results for input(s): LIPASE, AMYLASE in the last 168 hours. No results for input(s): AMMONIA in the last 168 hours. Coagulation profile No results for input(s): INR, PROTIME in the last 168 hours.  CBC: Recent Labs  Lab 06/29/21 0114  WBC 6.2  NEUTROABS 3.9  HGB 13.5  HCT 41.3  MCV 100.5*  PLT 145*   Cardiac Enzymes: No results for input(s): CKTOTAL, CKMB, CKMBINDEX, TROPONINI in the last 168 hours. BNP: Invalid input(s): POCBNP CBG: Recent Labs  Lab 06/29/21 1705  GLUCAP 98   D-Dimer No results for input(s): DDIMER in the last 72 hours. Hgb A1c No results for input(s): HGBA1C in the last 72 hours. Lipid Profile No results for input(s): CHOL, HDL, LDLCALC, TRIG, CHOLHDL, LDLDIRECT in the last 72 hours. Thyroid function studies No results for input(s): TSH, T4TOTAL, T3FREE, THYROIDAB in the last 72 hours.  Invalid input(s): FREET3 Anemia work up No results for input(s): VITAMINB12, FOLATE, FERRITIN, TIBC, IRON, RETICCTPCT in the last 72 hours. Microbiology Recent Results (from the past 240 hour(s))  Resp Panel by RT-PCR (Flu A&B, Covid) Nasopharyngeal Swab     Status: None   Collection Time: 06/29/21  1:17 AM   Specimen: Nasopharyngeal Swab; Nasopharyngeal(NP) swabs in vial transport medium  Result Value Ref Range Status   SARS Coronavirus 2 by RT PCR NEGATIVE NEGATIVE Final    Comment: (NOTE) SARS-CoV-2 target nucleic acids are NOT DETECTED.  The SARS-CoV-2 RNA is generally detectable in upper respiratory specimens during the acute phase of infection. The lowest concentration of SARS-CoV-2 viral copies this assay can detect is 138 copies/mL. A negative result does not preclude SARS-Cov-2 infection and should not be used as the sole basis for treatment or other patient  management decisions. A negative result may occur with  improper specimen collection/handling, submission of specimen other than nasopharyngeal swab, presence of viral mutation(s) within the areas targeted by this assay, and inadequate number of viral copies(<138 copies/mL). A negative result must be combined with clinical observations, patient history, and epidemiological information. The expected result is Negative.  Fact Sheet for Patients:  EntrepreneurPulse.com.au  Fact Sheet for Healthcare Providers:  IncredibleEmployment.be  This test is no t yet approved or cleared by the [place] FDA and  has been authorized for detection and/or diagnosis of SARS-CoV-2 by FDA under an Emergency Use Authorization (EUA). This EUA will remain  in effect (meaning this test can be used) for the duration of the COVID-19 declaration under Section 564(b)(1) of the Act, 21 U.S.C.section 360bbb-3(b)(1), unless the authorization is terminated  or revoked sooner.       Influenza A by PCR NEGATIVE NEGATIVE Final   Influenza B by PCR NEGATIVE NEGATIVE Final  Comment: (NOTE) The Xpert Xpress SARS-CoV-2/FLU/RSV plus assay is intended as an aid in the diagnosis of influenza from Nasopharyngeal swab specimens and should not be used as a sole basis for treatment. Nasal washings and aspirates are unacceptable for Xpert Xpress SARS-CoV-2/FLU/RSV testing.  Fact Sheet for Patients: EntrepreneurPulse.com.au  Fact Sheet for Healthcare Providers: IncredibleEmployment.be  This test is not yet approved or cleared by the [place] FDA and has been authorized for detection and/or diagnosis of SARS-CoV-2 by FDA under an Emergency Use Authorization (EUA). This EUA will remain in effect (meaning this test can be used) for the duration of the COVID-19 declaration under Section 564(b)(1) of the Act, 21 U.S.C. section 360bbb-3(b)(1),  unless the authorization is terminated or revoked.  Performed at [facility], [address] [place], [place] [address]      Discharge Instructions:   Discharge Instructions     Amb referral to AFIB Clinic   Complete by: As directed    Diet - low sodium heart healthy   Complete by: As directed    Discharge instructions   Complete by: As directed    Use 3L /min oxygen around the clock   Increase activity slowly   Complete by: As directed       Allergies as of 06/30/2021   No Known Allergies      Medication List     STOP taking these medications    amoxicillin-clavulanate 875-125 MG tablet Commonly known as: AUGMENTIN   aspirin 81 MG tablet   azithromycin 250 MG tablet Commonly known as: ZITHROMAX   Emgality 120 MG/ML Soaj Generic drug: Galcanezumab-gnlm   Myrbetriq 25 MG Tb24 tablet Generic drug: mirabegron ER   potassium chloride SA 20 MEQ tablet Commonly known as: KLOR-CON M   predniSONE 20 MG tablet Commonly known as: DELTASONE   SUMAtriptan 50 MG tablet Commonly known as: IMITREX   Trelegy Ellipta 100-62.5-25 MCG/ACT Aepb Generic drug: Fluticasone-Umeclidin-Vilant       TAKE these medications    albuterol 108 (90 Base) MCG/ACT inhaler Commonly known as: VENTOLIN HFA Inhale 2 puffs into the lungs every 6 (six) hours as needed for wheezing or shortness of breath.   ALPRAZolam 0.5 MG tablet Commonly known as: XANAX Take 0.5 mg by mouth at bedtime as needed for anxiety or sleep.   apixaban 5 MG Tabs tablet Commonly known as: ELIQUIS Take 1 tablet (5 mg total) by mouth 2 (two) times daily.   carbidopa-levodopa 25-100 MG tablet Commonly known as: SINEMET IR Take 2 tablets by mouth 5 (five) times daily.   [employer] Vitamin B-12 2000 MCG Tbcr Generic drug: Cyanocobalamin Take 2,000 mcg by mouth daily.   doxazosin 4 MG tablet Commonly known as: CARDURA Take 4 mg by mouth daily.   ferrous gluconate 324 MG tablet Commonly known  as: FERGON Take 324 mg by mouth daily with breakfast.   metoprolol tartrate 25 MG tablet Commonly known as: LOPRESSOR Take 1 tablet (25 mg total) by mouth 2 (two) times daily.   omeprazole 40 MG capsule Commonly known as: PRILOSEC Take 40 mg by mouth daily.   QUEtiapine 50 MG tablet Commonly known as: SEROQUEL Take 50 mg by mouth at bedtime.   rizatriptan 10 MG tablet Commonly known as: MAXALT Take 10 mg by mouth as needed for migraine. May repeat in 2 hours if needed   tiotropium 18 MCG inhalation capsule Commonly known as: SPIRIVA Place 18 mcg into inhaler and inhale daily.  If you experience worsening of your admission symptoms, develop shortness of breath, life threatening emergency, suicidal or homicidal thoughts you must seek medical attention immediately by calling 911 or calling your MD immediately  if symptoms less severe.   You must read complete instructions/literature along with all the possible adverse reactions/side effects for all the medicines you take and that have been prescribed to you. Take any new medicines after you have completely understood and accept all the possible adverse reactions/side effects.    Please note   You were cared for by a hospitalist during your hospital stay. If you have any questions about your discharge medications or the care you received while you were in the hospital after you are discharged, you can call the unit and asked to speak with the hospitalist on call if the hospitalist that took care of you is not available. Once you are discharged, your primary care physician will handle any further medical issues. Please note that NO REFILLS for any discharge medications will be authorized once you are discharged, as it is imperative that you return to your primary care physician (or establish a relationship with a primary care physician if you do not have one) for your aftercare needs so that they can reassess your need for  medications and monitor your lab values.       Time coordinating discharge: Less than 30 minutes  Signed:  Caleigh Rabelo  [facility] 06/30/2021, 4:29 PM   Pager on [email or web address]. If 7PM-7AM, please contact night-coverage at [email or web address]

## 2021-07-22 ENCOUNTER — Encounter (HOSPITAL_COMMUNITY): Payer: Self-pay

## 2021-07-22 ENCOUNTER — Emergency Department (HOSPITAL_COMMUNITY): Payer: Medicare Other

## 2021-07-22 ENCOUNTER — Inpatient Hospital Stay (HOSPITAL_COMMUNITY)
Admission: EM | Admit: 2021-07-22 | Discharge: 2021-07-25 | DRG: 190 | Disposition: A | Discharge status: Home Health | Payer: Medicare Other | Attending: Internal Medicine | Admitting: Internal Medicine

## 2021-07-22 ENCOUNTER — Other Ambulatory Visit: Payer: Self-pay

## 2021-07-22 DIAGNOSIS — I10 Essential (primary) hypertension: Secondary | ICD-10-CM | POA: Diagnosis present

## 2021-07-22 DIAGNOSIS — J439 Emphysema, unspecified: Principal | ICD-10-CM | POA: Diagnosis present

## 2021-07-22 DIAGNOSIS — Z20822 Contact with and (suspected) exposure to covid-19: Secondary | ICD-10-CM | POA: Diagnosis present

## 2021-07-22 DIAGNOSIS — G20 Parkinson's disease: Secondary | ICD-10-CM | POA: Diagnosis present

## 2021-07-22 DIAGNOSIS — Z7901 Long term (current) use of anticoagulants: Secondary | ICD-10-CM

## 2021-07-22 DIAGNOSIS — Z9981 Dependence on supplemental oxygen: Secondary | ICD-10-CM

## 2021-07-22 DIAGNOSIS — Z85828 Personal history of other malignant neoplasm of skin: Secondary | ICD-10-CM | POA: Diagnosis not present

## 2021-07-22 DIAGNOSIS — Z8719 Personal history of other diseases of the digestive system: Secondary | ICD-10-CM

## 2021-07-22 DIAGNOSIS — Z8711 Personal history of peptic ulcer disease: Secondary | ICD-10-CM | POA: Diagnosis not present

## 2021-07-22 DIAGNOSIS — Z87891 Personal history of nicotine dependence: Secondary | ICD-10-CM | POA: Diagnosis not present

## 2021-07-22 DIAGNOSIS — Z8619 Personal history of other infectious and parasitic diseases: Secondary | ICD-10-CM

## 2021-07-22 DIAGNOSIS — J9621 Acute and chronic respiratory failure with hypoxia: Secondary | ICD-10-CM | POA: Diagnosis present

## 2021-07-22 DIAGNOSIS — J441 Chronic obstructive pulmonary disease with (acute) exacerbation: Principal | ICD-10-CM

## 2021-07-22 DIAGNOSIS — H547 Unspecified visual loss: Secondary | ICD-10-CM | POA: Diagnosis present

## 2021-07-22 DIAGNOSIS — I480 Paroxysmal atrial fibrillation: Secondary | ICD-10-CM | POA: Diagnosis present

## 2021-07-22 DIAGNOSIS — N400 Enlarged prostate without lower urinary tract symptoms: Secondary | ICD-10-CM | POA: Diagnosis present

## 2021-07-22 DIAGNOSIS — D696 Thrombocytopenia, unspecified: Secondary | ICD-10-CM | POA: Diagnosis present

## 2021-07-22 DIAGNOSIS — K219 Gastro-esophageal reflux disease without esophagitis: Secondary | ICD-10-CM | POA: Diagnosis present

## 2021-07-22 DIAGNOSIS — Z87440 Personal history of urinary (tract) infections: Secondary | ICD-10-CM | POA: Diagnosis not present

## 2021-07-22 DIAGNOSIS — D7589 Other specified diseases of blood and blood-forming organs: Secondary | ICD-10-CM | POA: Diagnosis present

## 2021-07-22 LAB — COMPREHENSIVE METABOLIC PANEL
ALT: 10 U/L (ref 0–44)
AST: 24 U/L (ref 15–41)
Albumin: 3.7 g/dL (ref 3.5–5.0)
Alkaline Phosphatase: 67 U/L (ref 38–126)
Anion gap: 9 (ref 5–15)
BUN: 13 mg/dL (ref 8–23)
CO2: 32 mmol/L (ref 22–32)
Calcium: 9.1 mg/dL (ref 8.9–10.3)
Chloride: 104 mmol/L (ref 98–111)
Creatinine, Ser: 0.77 mg/dL (ref 0.61–1.24)
GFR, Estimated: >60 mL/min (ref >60)
Glucose, Bld: 101 mg/dL — ABNORMAL HIGH (ref 70–99)
Potassium: 4.6 mmol/L (ref 3.5–5.1)
Sodium: 145 mmol/L (ref 135–145)
Total Bilirubin: 0.5 mg/dL (ref 0.3–1.2)
Total Protein: 7.3 g/dL (ref 6.5–8.1)

## 2021-07-22 LAB — CBC
HCT: 46.5 % (ref 39.0–52.0)
Hemoglobin: 14.6 g/dL (ref 13.0–17.0)
MCH: 33.3 pg (ref 26.0–34.0)
MCHC: 31.4 g/dL (ref 30.0–36.0)
MCV: 105.9 fL — ABNORMAL HIGH (ref 80.0–100.0)
Platelets: 141 10*3/uL — ABNORMAL LOW (ref 150–400)
RBC: 4.39 MIL/uL (ref 4.22–5.81)
RDW: 13.7 % (ref 11.5–15.5)
WBC: 7.7 10*3/uL (ref 4.0–10.5)
nRBC: 0 % (ref 0.0–0.2)

## 2021-07-22 LAB — PROTIME-INR
INR: 1.1 (ref 0.8–1.2)
Prothrombin Time: 14.6 seconds (ref 11.4–15.2)

## 2021-07-22 LAB — RESP PANEL BY RT-PCR (FLU A&B, COVID) ARPGX2
Influenza A by PCR: NEGATIVE
Influenza B by PCR: NEGATIVE
SARS Coronavirus 2 by RT PCR: NEGATIVE

## 2021-07-22 LAB — LACTIC ACID, PLASMA: Lactic Acid, Venous: 1 mmol/L (ref 0.5–1.9)

## 2021-07-22 MED ORDER — IPRATROPIUM-ALBUTEROL 0.5-2.5 (3) MG/3ML IN SOLN
3.0000 mL | Freq: Four times a day (QID) | RESPIRATORY_TRACT | Status: DC
  Administered 2021-07-22 – 2021-07-23 (×4): 3 mL via RESPIRATORY_TRACT
  Filled 2021-07-22 (×4): qty 3

## 2021-07-22 MED ORDER — FERROUS GLUCONATE 324 (38 FE) MG PO TABS
324.0000 mg | ORAL_TABLET | Freq: Every day | ORAL | Status: DC
  Administered 2021-07-23 – 2021-07-25 (×3): 324 mg via ORAL
  Filled 2021-07-22 (×4): qty 1

## 2021-07-22 MED ORDER — ALPRAZOLAM 0.5 MG PO TABS
0.5000 mg | ORAL_TABLET | Freq: Every evening | ORAL | Status: DC | PRN
  Administered 2021-07-22 – 2021-07-24 (×3): 0.5 mg via ORAL
  Filled 2021-07-22: qty 1
  Filled 2021-07-22: qty 2
  Filled 2021-07-22: qty 1

## 2021-07-22 MED ORDER — DOXAZOSIN MESYLATE 4 MG PO TABS
4.0000 mg | ORAL_TABLET | Freq: Every day | ORAL | Status: DC
  Administered 2021-07-23 – 2021-07-25 (×3): 4 mg via ORAL
  Filled 2021-07-22 (×3): qty 1

## 2021-07-22 MED ORDER — METOPROLOL TARTRATE 25 MG PO TABS
25.0000 mg | ORAL_TABLET | Freq: Two times a day (BID) | ORAL | Status: DC
  Administered 2021-07-22 – 2021-07-25 (×6): 25 mg via ORAL
  Filled 2021-07-22 (×6): qty 1

## 2021-07-22 MED ORDER — PREDNISONE 20 MG PO TABS
40.0000 mg | ORAL_TABLET | Freq: Every day | ORAL | Status: DC
  Administered 2021-07-24 – 2021-07-25 (×2): 40 mg via ORAL
  Filled 2021-07-22 (×2): qty 2

## 2021-07-22 MED ORDER — QUETIAPINE FUMARATE 50 MG PO TABS
50.0000 mg | ORAL_TABLET | Freq: Every day | ORAL | Status: DC
  Administered 2021-07-22 – 2021-07-24 (×3): 50 mg via ORAL
  Filled 2021-07-22 (×5): qty 1

## 2021-07-22 MED ORDER — GUAIFENESIN ER 600 MG PO TB12
600.0000 mg | ORAL_TABLET | Freq: Two times a day (BID) | ORAL | Status: DC
  Administered 2021-07-22 – 2021-07-25 (×6): 600 mg via ORAL
  Filled 2021-07-22 (×6): qty 1

## 2021-07-22 MED ORDER — ACETAMINOPHEN 650 MG RE SUPP: 650.0000 mg | Freq: Four times a day (QID) | RECTAL | Status: DC | PRN

## 2021-07-22 MED ORDER — TRAZODONE HCL 50 MG PO TABS: 25.0000 mg | ORAL_TABLET | Freq: Every evening | ORAL | Status: DC | PRN

## 2021-07-22 MED ORDER — VITAMIN B-12 1000 MCG PO TABS
2000.0000 ug | ORAL_TABLET | Freq: Every day | ORAL | Status: DC
  Administered 2021-07-23 – 2021-07-25 (×3): 2000 ug via ORAL
  Filled 2021-07-22 (×3): qty 2

## 2021-07-22 MED ORDER — HYDROCOD POLI-CHLORPHE POLI ER 10-8 MG/5ML PO SUER: 5.0000 mL | Freq: Two times a day (BID) | ORAL | Status: DC | PRN

## 2021-07-22 MED ORDER — PANTOPRAZOLE SODIUM 40 MG PO TBEC
40.0000 mg | DELAYED_RELEASE_TABLET | Freq: Every day | ORAL | Status: DC
  Administered 2021-07-23 – 2021-07-25 (×3): 40 mg via ORAL
  Filled 2021-07-22 (×3): qty 1

## 2021-07-22 MED ORDER — SODIUM CHLORIDE 0.9 % IV SOLN: INTRAVENOUS | Status: DC

## 2021-07-22 MED ORDER — METHYLPREDNISOLONE SODIUM SUCC 40 MG IJ SOLR
40.0000 mg | Freq: Two times a day (BID) | INTRAMUSCULAR | Status: AC
  Administered 2021-07-23 (×2): 40 mg via INTRAVENOUS
  Filled 2021-07-22 (×2): qty 1

## 2021-07-22 MED ORDER — ONDANSETRON HCL 4 MG PO TABS: 4.0000 mg | ORAL_TABLET | Freq: Four times a day (QID) | ORAL | Status: DC | PRN

## 2021-07-22 MED ORDER — ALBUTEROL SULFATE (2.5 MG/3ML) 0.083% IN NEBU
5.0000 mg | INHALATION_SOLUTION | Freq: Once | RESPIRATORY_TRACT | Status: AC
  Administered 2021-07-22: 5 mg via RESPIRATORY_TRACT
  Filled 2021-07-22: qty 6

## 2021-07-22 MED ORDER — UMECLIDINIUM BROMIDE 62.5 MCG/ACT IN AEPB
1.0000 | INHALATION_SPRAY | Freq: Every day | RESPIRATORY_TRACT | Status: DC
  Administered 2021-07-23 – 2021-07-25 (×3): 1 via RESPIRATORY_TRACT
  Filled 2021-07-22: qty 7

## 2021-07-22 MED ORDER — SUMATRIPTAN SUCCINATE 50 MG PO TABS
50.0000 mg | ORAL_TABLET | ORAL | Status: DC | PRN
  Administered 2021-07-23: 50 mg via ORAL
  Filled 2021-07-22 (×2): qty 1

## 2021-07-22 MED ORDER — ACETAMINOPHEN 325 MG PO TABS
650.0000 mg | ORAL_TABLET | Freq: Four times a day (QID) | ORAL | Status: DC | PRN
Start: 2021-07-22 — End: 2021-07-25

## 2021-07-22 MED ORDER — APIXABAN 5 MG PO TABS
5.0000 mg | ORAL_TABLET | Freq: Two times a day (BID) | ORAL | Status: DC
  Administered 2021-07-22 – 2021-07-25 (×6): 5 mg via ORAL
  Filled 2021-07-22 (×6): qty 1

## 2021-07-22 MED ORDER — ENOXAPARIN SODIUM 40 MG/0.4ML IJ SOSY: 40.0000 mg | PREFILLED_SYRINGE | INTRAMUSCULAR | Status: DC

## 2021-07-22 MED ORDER — ALBUTEROL SULFATE (2.5 MG/3ML) 0.083% IN NEBU
2.5000 mg | INHALATION_SOLUTION | Freq: Four times a day (QID) | RESPIRATORY_TRACT | Status: DC | PRN
  Administered 2021-07-23 – 2021-07-25 (×3): 2.5 mg via RESPIRATORY_TRACT
  Filled 2021-07-22 (×3): qty 3

## 2021-07-22 MED ORDER — CARBIDOPA-LEVODOPA 25-100 MG PO TABS
2.0000 | ORAL_TABLET | Freq: Every day | ORAL | Status: DC
  Administered 2021-07-22 – 2021-07-25 (×13): 2 via ORAL
  Filled 2021-07-22 (×13): qty 2

## 2021-07-22 MED ORDER — SODIUM CHLORIDE 0.9 % IV SOLN
2.0000 g | INTRAVENOUS | Status: DC
  Administered 2021-07-22 – 2021-07-24 (×3): 2 g via INTRAVENOUS
  Filled 2021-07-22 (×3): qty 20

## 2021-07-22 MED ORDER — ONDANSETRON HCL 4 MG/2ML IJ SOLN: 4.0000 mg | Freq: Four times a day (QID) | INTRAMUSCULAR | Status: DC | PRN

## 2021-07-22 MED ORDER — MAGNESIUM HYDROXIDE 400 MG/5ML PO SUSP: 30.0000 mL | Freq: Every day | ORAL | Status: DC | PRN

## 2021-07-22 NOTE — Assessment & Plan Note (Signed)
-   We will continue PPI therapy 

## 2021-07-22 NOTE — Assessment & Plan Note (Signed)
-   We will monitor pulse oximetry. ?- He is currently close to his baseline. ?

## 2021-07-22 NOTE — Assessment & Plan Note (Addendum)
-   The patient will be admitted to a medically monitored bed. ?- He will be placed on IV steroid therapy with IV Solu-Medrol as well as nebulized bronchodilator therapy with duonebs q.i.d. and q.4 hours p.r.n., mucolytic therapy with Mucinex and antibiotic therapy with IV Rocephin especially given excessive mucus production. ?-O2 protocol will be followed. ?- We will continue Spiriva. ? ? ? ? ? ? ?

## 2021-07-22 NOTE — ED Triage Notes (Signed)
Pt bib ems from home c/o increase sob with exertion. Pt was recently d/c 07/11/2021 with dx PNA. Pt was provided a Z-Pack which resolved the issue at that time. Pt has hx of COPD and A-Fib (Eliquis). Pt baseline is 3 L nasal canula but have not been able to maintain his normal O2 levels. Pt has a nightly neb tx but for the pass three days he hasn't gotten any relief. Pt has had difficulty urinating and mild hallucination. When pt had symptoms like this before it was linked to a UTI.  ? ?Pt received 15 mg albuterol and 1 mg Atrovent neb tx ? ?BP 160/80 ?HR 80 ?CBG 144 ?

## 2021-07-22 NOTE — Assessment & Plan Note (Signed)
-   We will continue Eliquis and Toprol-XL. 

## 2021-07-22 NOTE — H&P (Signed)
[place] Montoya   PATIENT NAME: [place] Montoya    MR#:  [id number]  DATE OF BIRTH:  1940/09/14  DATE OF ADMISSION:  07/22/2021  PRIMARY CARE PHYSICIAN: [place] Harrier, MD   Patient is coming from: Home  REQUESTING/REFERRING PHYSICIAN: Pattricia Boss, MD  CHIEF COMPLAINT:   Chief Complaint  Patient presents with   Shortness of Breath    HISTORY OF PRESENT ILLNESS:  [place] Montoya is a 81 y.o. Caucasian male with medical history significant for COPD on home O2 at 3 L/min, BPH, paroxysmal atrial fibrillation on Eliquis, GERD, hypertension, Parkinson's disease and PUD, who presented to the ER with a [place] of worsening dyspnea with associated cough productive of yellowish sputum and wheezing over the last 4 days.  He admitted to tactile fever and had a temperature of 100 with no chills.  He has been using his nebulizer at bedtime as usual.  No chest pain or palpitations.  No nausea or vomiting or abdominal pain.  He tells me he was seen by his eye doctor on Thursday and was told he has a blood clot behind the retina.  He has no current acute visual problems.  He denies any dysuria, oliguria or hematuria or flank pain.  ED Course: Upon presentation to the ER Pulsoxymeter is 99% on 6 L O2 nasal cannula.  Later on it was 96% on 3 L.  Vital signs were otherwise within normal and later respiratory rate was 23.  CMP was unremarkable and his CBC showed mild thrombocytopenia 141 with macrocytosis.  Influenza antigens and COVID-19 PCR came back negative. EKG as reviewed by me : EKG showed normal sinus rhythm with rate of of 74 with left atrial enlargement. Imaging: Portable chest ray showed emphysema with no acute cardiopulmonary disease.  The patient was given IV Solu-Medrol and neurolyse abdominal Atrovent by EMS and in the ER continues nebulizer albuterol.  He will be admitted to a medical telemetry bed for further evaluation and management PAST MEDICAL HISTORY:   Past Medical History:   Diagnosis Date   Basal cell carcinoma 07/25/2015   L zygoma inf to lat canthus   Basal cell carcinoma 06/11/2018   L nasal ala   BPH (benign prostatic hyperplasia)    COPD (chronic obstructive pulmonary disease) (HCC)    Elevated liver enzymes    Emphysema of lung (HCC)    Gastric ulcer    GERD (gastroesophageal reflux disease)    Hepatitis B    Hypertension    Parkinson's disease (HCC)    Prostate hypertrophy    Rotator cuff tear right   UTI (urinary tract infection)     PAST SURGICAL HISTORY:   Past Surgical History:  Procedure Laterality Date   CHOLECYSTECTOMY     COLONOSCOPY WITH PROPOFOL N/A 10/20/2015   Procedure: COLONOSCOPY WITH PROPOFOL;  Surgeon: Manya Silvas, MD;  Location[facility] ENDOSCOPY;  Service: Endoscopy;  Laterality: N/A;   COLONOSCOPY WITH PROPOFOL N/A 01/24/2021   Procedure: COLONOSCOPY WITH PROPOFOL;  Surgeon: Toledo, Benay Pike, MD;  Location: [facility] ENDOSCOPY;  Service: Gastroenterology;  Laterality: N/A;   ESOPHAGOGASTRODUODENOSCOPY (EGD) WITH PROPOFOL N/A 10/20/2015   Procedure: ESOPHAGOGASTRODUODENOSCOPY (EGD) WITH PROPOFOL;  Surgeon: Manya Silvas, MD;  Location: [facility] ENDOSCOPY;  Service: Endoscopy;  Laterality: N/A;   GREEN LIGHT LASER TURP (TRANSURETHRAL RESECTION OF PROSTATE N/A 11/17/2018   Procedure: GREEN LIGHT LASER TURP (TRANSURETHRAL RESECTION OF PROSTATE;  Surgeon: Royston Cowper, MD;  Location: [facility] ORS;  Service: Urology;  Laterality:  N/A;   INGUINAL HERNIA REPAIR Right 02/18/2019   Procedure: HERNIA REPAIR INGUINAL ADULT;  Surgeon: Royston Cowper, MD;  Location: [facility] ORS;  Service: Urology;  Laterality: Right;   INSERTION OF MESH Right 02/18/2019   Procedure: INSERTION OF MESH;  Surgeon: Royston Cowper, MD;  Location: [facility] ORS;  Service: Urology;  Laterality: Right;   TONSILLECTOMY     TRANSURETHRAL MICROWAVE THERAPY      SOCIAL HISTORY:   Social History   Tobacco Use   Smoking status: Former    Packs/day: 1.00    Years: 62.00     Pack years: 62.00    Types: Cigarettes   Smokeless tobacco: Never  Substance Use Topics   Alcohol use: Yes    Comment: occasional    FAMILY HISTORY:   Family History  Problem Relation Age of Onset   Colon cancer Mother    Hepatitis Son    Prostate cancer Neg Hx    Kidney cancer Neg Hx     DRUG ALLERGIES:  No Known Allergies  REVIEW OF SYSTEMS:   ROS As per history of present illness. All pertinent systems were reviewed above. Constitutional, HEENT, cardiovascular, respiratory, GI, GU, musculoskeletal, neuro, psychiatric, endocrine, integumentary and hematologic systems were reviewed and are otherwise negative/unremarkable except for positive findings mentioned above in the HPI.   MEDICATIONS AT HOME:   Prior to Admission medications   Medication Sig Start Date End Date Taking? Authorizing Provider  albuterol (PROVENTIL HFA;VENTOLIN HFA) 108 (90 Base) MCG/ACT inhaler Inhale 2 puffs into the lungs every 6 (six) hours as needed for wheezing or shortness of breath.    [provider]  ALPRAZolam Duanne Moron) 0.5 MG tablet Take 0.5 mg by mouth at bedtime as needed for anxiety or sleep.    [provider]  apixaban (ELIQUIS) 5 MG TABS tablet Take 1 tablet (5 mg total) by mouth 2 (two) times daily. 03/10/21 07/08/21  Val Riles, MD  carbidopa-levodopa (SINEMET IR) 25-100 MG tablet Take 2 tablets by mouth 5 (five) times daily.    [provider]  Cyanocobalamin ([employer] VITAMIN B-12) 2000 MCG TBCR Take 2,000 mcg by mouth daily.    [provider]  doxazosin (CARDURA) 4 MG tablet Take 4 mg by mouth daily.     [provider]  ferrous gluconate (FERGON) 324 MG tablet Take 324 mg by mouth daily with breakfast.    [provider]  metoprolol tartrate (LOPRESSOR) 25 MG tablet Take 1 tablet (25 mg total) by mouth 2 (two) times daily. 03/10/21 06/29/21  Val Riles, MD  omeprazole (PRILOSEC) 40 MG capsule Take 40 mg by mouth daily.  11/06/15    [provider]  QUEtiapine (SEROQUEL) 50 MG tablet Take 50 mg by mouth at bedtime.    [provider]  rizatriptan (MAXALT) 10 MG tablet Take 10 mg by mouth as needed for migraine. May repeat in 2 hours if needed    [provider]  tiotropium (SPIRIVA) 18 MCG inhalation capsule Place 18 mcg into inhaler and inhale daily.    [provider]      VITAL SIGNS:  Blood pressure (!) 142/70, pulse 93, temperature 97.6 F (36.4 C), temperature source Rectal, resp. rate 20, height '5\' 10"'$  (1.778 m), weight 77.1 kg, SpO2 92 %.  PHYSICAL EXAMINATION:  Physical Exam  GENERAL:  81 y.o.-year-old Caucasian male patient lying in the bed with no acute distress.  EYES: Pupils equal, round, reactive to light and accommodation. No scleral icterus.  Extraocular muscles intact.  HEENT: Head atraumatic, normocephalic. Oropharynx and nasopharynx clear.  NECK:  Supple, no jugular venous distention. No thyroid enlargement, no tenderness.  LUNGS: Diffuse expiratory wheezes with diminished expiratory airflow and harsh vesicular breathing. CARDIOVASCULAR: Regular rate and rhythm, S1, S2 normal. No murmurs, rubs, or gallops.  ABDOMEN: Soft, nondistended, nontender. Bowel sounds present. No organomegaly or mass.  EXTREMITIES: No pedal edema, cyanosis, or clubbing.  NEUROLOGIC: Cranial nerves II through XII are intact. Muscle strength 5/5 in all extremities. Sensation intact. Gait not checked.  PSYCHIATRIC: The patient is alert and oriented x 3.  Normal affect and good eye contact. SKIN: No obvious rash, lesion, or ulcer.   LABORATORY PANEL:   CBC Recent Labs  Lab 07/22/21 1842  WBC 7.7  HGB 14.6  HCT 46.5  PLT 141*   ------------------------------------------------------------------------------------------------------------------  Chemistries  Recent Labs  Lab 07/22/21 1842  NA 145  K 4.6  CL 104  CO2 32  GLUCOSE 101*  BUN 13  CREATININE 0.77  CALCIUM 9.1  AST  24  ALT 10  ALKPHOS 67  BILITOT 0.5   ------------------------------------------------------------------------------------------------------------------  Cardiac Enzymes No results for input(s): TROPONINI in the last 168 hours. ------------------------------------------------------------------------------------------------------------------  RADIOLOGY:  DG Chest Port 1 View  Result Date: 07/22/2021 CLINICAL DATA:  Cough. EXAM: PORTABLE CHEST 1 VIEW COMPARISON:  Chest radiograph dated 06/29/2021. FINDINGS: Background of emphysema. Bilateral mid to lower lung field interstitial coarsening, atelectasis, and scarring. No focal consolidation, pleural effusion, or pneumothorax. The cardiac silhouette is within normal limits. Atherosclerotic calcification of the aorta. No acute osseous pathology. IMPRESSION: 1. No acute cardiopulmonary process. 2. Emphysema. Electronically Signed   By: Anner Crete M.D.   On: 07/22/2021 19:08      IMPRESSION AND PLAN:  Assessment and Plan: * COPD exacerbation ([place]) - The patient will be admitted to a medically monitored bed. - He will be placed on IV steroid therapy with IV Solu-Medrol as well as nebulized bronchodilator therapy with duonebs q.i.d. and q.4 hours p.r.n., mucolytic therapy with Mucinex and antibiotic therapy with IV Rocephin especially given excessive mucus production. -O2 protocol will be followed. - We will continue Spiriva.        Acute on chronic respiratory failure with hypoxia (HCC) - We will monitor pulse oximetry. - He is currently close to his baseline.  Paroxysmal atrial fibrillation (HCC) - We will continue Eliquis and Toprol-XL.  Parkinson's disease ([place]) We will continue Sinemet IR.  History of hepatitis D - We will continue PPI therapy  Essential hypertension - We will continue Cardura and Toprol-XL.   DVT prophylaxis: Lovenox. Advanced Care Planning:  Code Status: full code. Family Communication:  The plan  of care was discussed in details with the patient (and family). I answered all questions. The patient agreed to proceed with the above mentioned plan. Further management will depend upon hospital course. Disposition Plan: Back to previous home environment Consults called: none.  All the records are reviewed and case discussed with ED provider.  Status is: Inpatient  At the time of the admission, it appears that the appropriate admission status for this patient is inpatient.  This is judged to be reasonable and necessary in order to provide the required intensity of service to ensure the patient's safety given the presenting symptoms, physical exam findings and initial radiographic and laboratory data in the context of comorbid conditions.  The patient requires inpatient status due to high intensity of service, high risk of further deterioration and high frequency of  surveillance required.  I certify that at the time of admission, it is my clinical judgment that the patient will require inpatient hospital care extending more than 2 midnights.                            Dispo: The patient is from: Home              Anticipated d/c is to: Home              Patient currently is not medically stable to d/c.              Difficult to place patient: No  Christel Mormon M.D on 07/22/2021 at 11:45 PM  [facility]   From 7 PM-7 AM, contact night-coverage [email or web address]  CC: Primary care physician; [place] Harrier, MD

## 2021-07-22 NOTE — Assessment & Plan Note (Signed)
-   We will continue Cardura and Toprol-XL. ?

## 2021-07-22 NOTE — ED Provider Notes (Signed)
[facility] EMERGENCY DEPARTMENT Provider Note   CSN: [id number] Arrival date & time: 07/22/21  1815     History  Chief Complaint  Patient presents with   Shortness of Breath    Patrick Montoya is a 81 y.o. male.  HPI     81 year old male history of COPD, chronic respiratory failure, on oxygen, A-fib on chronic anticoagulation, presents today with reports of a recent infection with increased oxygen demand, weakness, and decreased sats.  EMS reports that he is coming from home.  They report that his sats have been dropping into the 60s to 80s.  He is on a baseline of 3 L/min.  Reportedly was recently diagnosed with a respiratory infection and completed a Z-Pak with some improvement.  EMS states that the family told them that the patient had some increased confusion today.  The patient family associates this with patient's previous urinary tract infections.  There is no report regarding urinary status. Patient endorses that he is eating and drinking without difficulty.  Home Medications Prior to Admission medications   Medication Sig Start Date End Date Taking? Authorizing Provider  albuterol (PROVENTIL HFA;VENTOLIN HFA) 108 (90 Base) MCG/ACT inhaler Inhale 2 puffs into the lungs every 6 (six) hours as needed for wheezing or shortness of breath.    [provider]  ALPRAZolam Duanne Moron) 0.5 MG tablet Take 0.5 mg by mouth at bedtime as needed for anxiety or sleep.    [provider]  apixaban (ELIQUIS) 5 MG TABS tablet Take 1 tablet (5 mg total) by mouth 2 (two) times daily. 03/10/21 07/08/21  Val Riles, MD  carbidopa-levodopa (SINEMET IR) 25-100 MG tablet Take 2 tablets by mouth 5 (five) times daily.    [provider]  Cyanocobalamin ([employer] VITAMIN B-12) 2000 MCG TBCR Take 2,000 mcg by mouth daily.    [provider]  doxazosin (CARDURA) 4 MG tablet Take 4 mg by mouth daily.     [provider]  ferrous gluconate (FERGON) 324  MG tablet Take 324 mg by mouth daily with breakfast.    [provider]  metoprolol tartrate (LOPRESSOR) 25 MG tablet Take 1 tablet (25 mg total) by mouth 2 (two) times daily. 03/10/21 06/29/21  Val Riles, MD  omeprazole (PRILOSEC) 40 MG capsule Take 40 mg by mouth daily.  11/06/15   [provider]  QUEtiapine (SEROQUEL) 50 MG tablet Take 50 mg by mouth at bedtime.    [provider]  rizatriptan (MAXALT) 10 MG tablet Take 10 mg by mouth as needed for migraine. May repeat in 2 hours if needed    [provider]  tiotropium (SPIRIVA) 18 MCG inhalation capsule Place 18 mcg into inhaler and inhale daily.    [provider]      Allergies    Patient has no known allergies.    Review of Systems   Review of Systems  All other systems reviewed and are negative.  Physical Exam Updated Vital Signs BP (!) 144/81    Pulse 80    Temp 97.6 F (36.4 C) (Rectal)    Resp 19    Ht 1.778 m ('5\' 10"'$ )    Wt 77.1 kg    SpO2 92%    BMI 24.39 kg/m  Physical Exam Vitals and nursing note reviewed.  Constitutional:      General: He is not in acute distress.    Appearance: Normal appearance. He is ill-appearing.  HENT:     Head: Normocephalic.  Right Ear: External ear normal.     Left Ear: External ear normal.     Mouth/Throat:     Mouth: Mucous membranes are dry.     Pharynx: Oropharynx is clear.  Eyes:     Pupils: Pupils are equal, round, and reactive to light.  Cardiovascular:     Rate and Rhythm: Normal rate and regular rhythm.     Pulses: Normal pulses.     Heart sounds: Normal heart sounds.  Pulmonary:     Effort: Pulmonary effort is normal.     Breath sounds: Wheezing and rhonchi present.  Abdominal:     General: Bowel sounds are normal.     Palpations: Abdomen is soft.  Musculoskeletal:     Cervical back: Normal range of motion.     Right lower leg: Edema present.     Left lower leg: Edema present.     Comments: Trace edema bilateral  lower extremities Pulses intact No cords noted  Skin:    General: Skin is warm and dry.     Capillary Refill: Capillary refill takes less than 2 seconds.  Neurological:     General: No focal deficit present.     Mental Status: He is alert.     Cranial Nerves: No cranial nerve deficit.     Sensory: No sensory deficit.     Motor: No weakness.     Coordination: Coordination normal.  Psychiatric:        Mood and Affect: Mood normal.        Behavior: Behavior normal.    ED Results / Procedures / Treatments   Labs (all labs ordered are listed, but only abnormal results are displayed) Labs Reviewed  CBC - Abnormal; Notable for the following components:      Result Value   MCV 105.9 (*)    Platelets 141 (*)    All other components within normal limits  COMPREHENSIVE METABOLIC PANEL - Abnormal; Notable for the following components:   Glucose, Bld 101 (*)    All other components within normal limits  RESP PANEL BY RT-PCR (FLU A&B, COVID) ARPGX2  LACTIC ACID, PLASMA  PROTIME-INR  LACTIC ACID, PLASMA    EKG EKG Interpretation  Date/Time:  Sunday July 22 2021 18:24:13 EDT Ventricular Rate:  74 PR Interval:  177 QRS Duration: 87 QT Interval:  403 QTC Calculation: 448 R Axis:   56 Text Interpretation: Sinus rhythm Consider left atrial enlargement since last tracing patient is in nsr Confirmed by Pattricia Boss [phone]) on 07/22/2021 6:28:31 PM  Radiology DG Chest Port 1 View  Result Date: 07/22/2021 CLINICAL DATA:  Cough. EXAM: PORTABLE CHEST 1 VIEW COMPARISON:  Chest radiograph dated 06/29/2021. FINDINGS: Background of emphysema. Bilateral mid to lower lung field interstitial coarsening, atelectasis, and scarring. No focal consolidation, pleural effusion, or pneumothorax. The cardiac silhouette is within normal limits. Atherosclerotic calcification of the aorta. No acute osseous pathology. IMPRESSION: 1. No acute cardiopulmonary process. 2. Emphysema. Electronically Signed   By:  Anner Crete M.D.   On: 07/22/2021 19:08    Procedures Procedures    Medications Ordered in ED Medications  albuterol (PROVENTIL) (2.5 MG/3ML) 0.083% nebulizer solution 5 mg (5 mg Nebulization Given 07/22/21 2002)    ED Course/ Medical Decision Making/ A&P Clinical Course as of 07/22/21 2010  Sun Jul 22, 2021  1952 Chest x-[place] reviewed and interpreted with no acute process but chronic emphysema and radiologist interpretation reviewed [DR]  1952 CBC(!) CBC reviewed and interpreted with normal white  blood cell count, hemoglobin and some decrease in platelets at 141,000 with first prior 3 weeks ago being 145,000 [DR]    Clinical Course User Index [DR] Pattricia Boss, MD                           Medical Decision Making 81 year old man history of A-fib on anticoagulation, COPD with chronic respiratory failure on oxygen presents today with increased wheezing and respiratory distress.  Patient treated prehospital with Solu-Medrol and albuterol x10 as well as Atrovent. Patient continues to have diffuse wheezing.  He is on his home oxygen of 4 L/min Evaluation here in the ED included chest x-Glennette Galster which shows no evidence of focal infiltrate CBC is significant for thrombocytopenia which appears stable from 3 weeks ago and is not severe Electrolytes are stable Patient has been reassessed.  He is respiratory status has improved and he is having less work of breathing.  He continues to be somewhat tachypneic with diffuse wheezing noted.` He is having repeat nebulizer treatments ordered.  Amount and/or Complexity of Data Reviewed Independent Historian: EMS    Details: eMS reports that patient was very dyspneic on their arrival.  He required Ultiva doses of albuterol and was given Atrovent and Solu-Medrol prehospital External Data Reviewed: labs and notes.    Details: Reviewed recent admission to hospital for A-fib RVR Reviewed CBC and platelet on last admission s Labs: ordered. Decision-making  details documented in ED Course. Radiology: ordered and independent interpretation performed. Decision-making details documented in ED Course. ECG/medicine tests: ordered and independent interpretation performed. Decision-making details documented in ED Course.    Details: Patient remains on monitor and oxygen saturations are 95% with increased respiratory rate, stable heart rate, stable blood pressure 120/80 Discussion of management or test interpretation with external provider(s): Discussed care with Dr. Sidney Ace who  will see for admission  Risk Prescription drug management. Decision regarding hospitalization.           Final Clinical Impression(s) / ED Diagnoses Final diagnoses:  COPD exacerbation [facility])    Rx / DC Orders ED Discharge Orders     None         Pattricia Boss, MD 07/22/21 2010

## 2021-07-22 NOTE — Assessment & Plan Note (Signed)
-   We will continue Sinemet IR. 

## 2021-07-23 DIAGNOSIS — J441 Chronic obstructive pulmonary disease with (acute) exacerbation: Secondary | ICD-10-CM | POA: Diagnosis not present

## 2021-07-23 LAB — BASIC METABOLIC PANEL
Anion gap: 5 (ref 5–15)
BUN: 12 mg/dL (ref 8–23)
CO2: 33 mmol/L — ABNORMAL HIGH (ref 22–32)
Calcium: 8.8 mg/dL — ABNORMAL LOW (ref 8.9–10.3)
Chloride: 106 mmol/L (ref 98–111)
Creatinine, Ser: 0.78 mg/dL (ref 0.61–1.24)
GFR, Estimated: >60 mL/min (ref >60)
Glucose, Bld: 96 mg/dL (ref 70–99)
Potassium: 4 mmol/L (ref 3.5–5.1)
Sodium: 144 mmol/L (ref 135–145)

## 2021-07-23 LAB — CBC
HCT: 43 % (ref 39.0–52.0)
Hemoglobin: 13.7 g/dL (ref 13.0–17.0)
MCH: 33.5 pg (ref 26.0–34.0)
MCHC: 31.9 g/dL (ref 30.0–36.0)
MCV: 105.1 fL — ABNORMAL HIGH (ref 80.0–100.0)
Platelets: 136 10*3/uL — ABNORMAL LOW (ref 150–400)
RBC: 4.09 MIL/uL — ABNORMAL LOW (ref 4.22–5.81)
RDW: 13.6 % (ref 11.5–15.5)
WBC: 8.6 10*3/uL (ref 4.0–10.5)
nRBC: 0 % (ref 0.0–0.2)

## 2021-07-23 MED ORDER — IPRATROPIUM-ALBUTEROL 0.5-2.5 (3) MG/3ML IN SOLN
3.0000 mL | Freq: Three times a day (TID) | RESPIRATORY_TRACT | Status: DC
  Administered 2021-07-23 – 2021-07-24 (×2): 3 mL via RESPIRATORY_TRACT
  Filled 2021-07-23 (×2): qty 3

## 2021-07-23 NOTE — ED Notes (Signed)
ED TO INPATIENT HANDOFF REPORT  ED Nurse Name and Phone #: Caryl Pina RN [phone]  S Name/Age/Gender Patrick Montoya 81 y.o. male Room/Bed: 045C/045C  Code Status   Code Status: Full Code  Home/SNF/Other Home Patient oriented to: self, place, time, and situation Is this baseline? Yes   Triage Complete: Triage complete  Chief Complaint COPD exacerbation ([place]) [J44.1]  Triage Note Pt bib ems from home c/o increase sob with exertion. Pt was recently d/c 07/11/2021 with dx PNA. Pt was provided a Z-Pack which resolved the issue at that time. Pt has hx of COPD and A-Fib (Eliquis). Pt baseline is 3 L nasal canula but have not been able to maintain his normal O2 levels. Pt has a nightly neb tx but for the pass three days he hasn't gotten any relief. Pt has had difficulty urinating and mild hallucination. When pt had symptoms like this before it was linked to a UTI.   Pt received 15 mg albuterol and 1 mg Atrovent neb tx  BP 160/80 HR 80 CBG 144   Allergies No Known Allergies  Level of Care/Admitting Diagnosis ED Disposition     ED Disposition  Admit   Condition  --   Comment  Hospital Area: [facility] [[id number]]  Level of Care: Telemetry Medical [104]  May admit patient to [facility] or [facility] if equivalent level of care is available:: No  Covid Evaluation: Asymptomatic Screening Protocol (No Symptoms)  Diagnosis: COPD exacerbation [facility]) [[id number]]  Admitting Physician: Christel Mormon [[id number]]  Attending Physician: Christel Mormon [[id number]]  Estimated length of stay: past midnight tomorrow  Certification:: I certify this patient will need inpatient services for at least 2 midnights          B Medical/Surgery History Past Medical History:  Diagnosis Date   Basal cell carcinoma 07/25/2015   L zygoma inf to lat canthus   Basal cell carcinoma 06/11/2018   L nasal ala   BPH (benign prostatic hyperplasia)    COPD (chronic obstructive pulmonary disease)  (HCC)    Elevated liver enzymes    Emphysema of lung ([place])    Gastric ulcer    GERD (gastroesophageal reflux disease)    Hepatitis B    Hypertension    Parkinson's disease ([place])    Prostate hypertrophy    Rotator cuff tear right   UTI (urinary tract infection)    Past Surgical History:  Procedure Laterality Date   CHOLECYSTECTOMY     COLONOSCOPY WITH PROPOFOL N/A 10/20/2015   Procedure: COLONOSCOPY WITH PROPOFOL;  Surgeon: Manya Silvas, MD;  Location: [facility] ENDOSCOPY;  Service: Endoscopy;  Laterality: N/A;   COLONOSCOPY WITH PROPOFOL N/A 01/24/2021   Procedure: COLONOSCOPY WITH PROPOFOL;  Surgeon: Toledo, Benay Pike, MD;  Location: [facility] ENDOSCOPY;  Service: Gastroenterology;  Laterality: N/A;   ESOPHAGOGASTRODUODENOSCOPY (EGD) WITH PROPOFOL N/A 10/20/2015   Procedure: ESOPHAGOGASTRODUODENOSCOPY (EGD) WITH PROPOFOL;  Surgeon: Manya Silvas, MD;  Location: [facility] ENDOSCOPY;  Service: Endoscopy;  Laterality: N/A;   GREEN LIGHT LASER TURP (TRANSURETHRAL RESECTION OF PROSTATE N/A 11/17/2018   Procedure: GREEN LIGHT LASER TURP (TRANSURETHRAL RESECTION OF PROSTATE;  Surgeon: Royston Cowper, MD;  Location: [facility] ORS;  Service: Urology;  Laterality: N/A;   INGUINAL HERNIA REPAIR Right 02/18/2019   Procedure: HERNIA REPAIR INGUINAL ADULT;  Surgeon: Royston Cowper, MD;  Location: [facility] ORS;  Service: Urology;  Laterality: Right;   INSERTION OF MESH Right 02/18/2019   Procedure: INSERTION OF MESH;  Surgeon: Maryan Puls  R, MD;  Location: [facility] ORS;  Service: Urology;  Laterality: Right;   TONSILLECTOMY     TRANSURETHRAL MICROWAVE THERAPY       A IV Location/Drains/Wounds Patient Lines/Drains/Airways Status     Active Line/Drains/Airways     Name Placement date Placement time Site Days   Peripheral IV 07/22/21 20 G Anterior;Proximal;Right Forearm 07/22/21  1851  Forearm  1            Intake/Output Last 24 hours  Intake/Output Summary (Last 24 hours) at 07/23/2021 1329 Last data filed  at 07/23/2021 3614 Gross per 24 hour  Intake 908.93 ml  Output 500 ml  Net 408.93 ml    Labs/Imaging Results for orders placed or performed during the hospital encounter of 07/22/21 (from the past 48 hour(s))  Resp Panel by RT-PCR (Flu A&B, Covid) Nasopharyngeal Swab     Status: None   Collection Time: 07/22/21  6:25 PM   Specimen: Nasopharyngeal Swab; Nasopharyngeal(NP) swabs in vial transport medium  Result Value Ref Range   SARS Coronavirus 2 by RT PCR NEGATIVE NEGATIVE    Comment: (NOTE) SARS-CoV-2 target nucleic acids are NOT DETECTED.  The SARS-CoV-2 RNA is generally detectable in upper respiratory specimens during the acute phase of infection. The lowest concentration of SARS-CoV-2 viral copies this assay can detect is 138 copies/mL. A negative result does not preclude SARS-Cov-2 infection and should not be used as the sole basis for treatment or other patient management decisions. A negative result may occur with  improper specimen collection/handling, submission of specimen other than nasopharyngeal swab, presence of viral mutation(s) within the areas targeted by this assay, and inadequate number of viral copies(<138 copies/mL). A negative result must be combined with clinical observations, patient history, and epidemiological information. The expected result is Negative.  Fact Sheet for Patients:  EntrepreneurPulse.com.au  Fact Sheet for Healthcare Providers:  IncredibleEmployment.be  This test is no t yet approved or cleared by the [place] FDA and  has been authorized for detection and/or diagnosis of SARS-CoV-2 by FDA under an Emergency Use Authorization (EUA). This EUA will remain  in effect (meaning this test can be used) for the duration of the COVID-19 declaration under Section 564(b)(1) of the Act, 21 U.S.C.section 360bbb-3(b)(1), unless the authorization is terminated  or revoked sooner.       Influenza A by PCR  NEGATIVE NEGATIVE   Influenza B by PCR NEGATIVE NEGATIVE    Comment: (NOTE) The Xpert Xpress SARS-CoV-2/FLU/RSV plus assay is intended as an aid in the diagnosis of influenza from Nasopharyngeal swab specimens and should not be used as a sole basis for treatment. Nasal washings and aspirates are unacceptable for Xpert Xpress SARS-CoV-2/FLU/RSV testing.  Fact Sheet for Patients: EntrepreneurPulse.com.au  Fact Sheet for Healthcare Providers: IncredibleEmployment.be  This test is not yet approved or cleared by the [place] FDA and has been authorized for detection and/or diagnosis of SARS-CoV-2 by FDA under an Emergency Use Authorization (EUA). This EUA will remain in effect (meaning this test can be used) for the duration of the COVID-19 declaration under Section 564(b)(1) of the Act, 21 U.S.C. section 360bbb-3(b)(1), unless the authorization is terminated or revoked.  Performed at [facility], [place] [address] [place], [place] [address]   Lactic acid, plasma     Status: None   Collection Time: 07/22/21  6:41 PM  Result Value Ref Range   Lactic Acid, Venous 1.0 0.5 - 1.9 mmol/L    Comment: Performed at [facility],  [address], [place], [place] [address]  CBC     Status: Abnormal   Collection Time: 07/22/21  6:42 PM  Result Value Ref Range   WBC 7.7 4.0 - 10.5 K/uL   RBC 4.39 4.22 - 5.81 MIL/uL   Hemoglobin 14.6 13.0 - 17.0 g/dL   HCT 46.5 39.0 - 52.0 %   MCV 105.9 (H) 80.0 - 100.0 fL   MCH 33.3 26.0 - 34.0 pg   MCHC 31.4 30.0 - 36.0 g/dL   RDW 13.7 11.5 - 15.5 %   Platelets 141 (L) 150 - 400 K/uL   nRBC 0.0 0.0 - 0.2 %    Comment: Performed at [facility], [place] [address], [place], [place] [address]  Comprehensive metabolic panel     Status: Abnormal   Collection Time: 07/22/21  6:42 PM  Result Value Ref Range   Sodium 145 135 - 145 mmol/L   Potassium 4.6 3.5 - 5.1 mmol/L   Chloride 104 98 - 111 mmol/L    CO2 32 22 - 32 mmol/L   Glucose, Bld 101 (H) 70 - 99 mg/dL    Comment: Glucose reference range applies only to samples taken after fasting for at least 8 hours.   BUN 13 8 - 23 mg/dL   Creatinine, Ser 0.77 0.61 - 1.24 mg/dL   Calcium 9.1 8.9 - 10.3 mg/dL   Total Protein 7.3 6.5 - 8.1 g/dL   Albumin 3.7 3.5 - 5.0 g/dL   AST 24 15 - 41 U/L   ALT 10 0 - 44 U/L   Alkaline Phosphatase 67 38 - 126 U/L   Total Bilirubin 0.5 0.3 - 1.2 mg/dL   GFR, Estimated >60 >60 mL/min    Comment: (NOTE) Calculated using the CKD-EPI Creatinine Equation (2021)    Anion gap 9 5 - 15    Comment: Performed at [facility] [address] [place], [place] [address]  Protime-INR     Status: None   Collection Time: 07/22/21  6:42 PM  Result Value Ref Range   Prothrombin Time 14.6 11.4 - 15.2 seconds   INR 1.1 0.8 - 1.2    Comment: (NOTE) INR goal varies based on device and disease states. Performed at [facility], [place] [address], [place], [place] [address]   Basic metabolic panel     Status: Abnormal   Collection Time: 07/23/21  4:50 AM  Result Value Ref Range   Sodium 144 135 - 145 mmol/L   Potassium 4.0 3.5 - 5.1 mmol/L   Chloride 106 98 - 111 mmol/L   CO2 33 (H) 22 - 32 mmol/L   Glucose, Bld 96 70 - 99 mg/dL    Comment: Glucose reference range applies only to samples taken after fasting for at least 8 hours.   BUN 12 8 - 23 mg/dL   Creatinine, Ser 0.78 0.61 - 1.24 mg/dL   Calcium 8.8 (L) 8.9 - 10.3 mg/dL   GFR, Estimated >60 >60 mL/min    Comment: (NOTE) Calculated using the CKD-EPI Creatinine Equation (2021)    Anion gap 5 5 - 15    Comment: Performed at [facility] [address] [place] [address]  CBC     Status: Abnormal   Collection Time: 07/23/21  4:50 AM  Result Value Ref Range   WBC 8.6 4.0 - 10.5 K/uL   RBC 4.09 (L) 4.22 - 5.81 MIL/uL   Hemoglobin 13.7 13.0 - 17.0 g/dL  HCT 43.0 39.0 - 52.0 %   MCV 105.1 (H) 80.0 - 100.0 fL   MCH 33.5 26.0 - 34.0  pg   MCHC 31.9 30.0 - 36.0 g/dL   RDW 13.6 11.5 - 15.5 %   Platelets 136 (L) 150 - 400 K/uL   nRBC 0.0 0.0 - 0.2 %    Comment: Performed at [facility] [address] [place], [place] [address]   DG Chest Port 1 View  Result Date: 07/22/2021 CLINICAL DATA:  Cough. EXAM: PORTABLE CHEST 1 VIEW COMPARISON:  Chest radiograph dated 06/29/2021. FINDINGS: Background of emphysema. Bilateral mid to lower lung field interstitial coarsening, atelectasis, and scarring. No focal consolidation, pleural effusion, or pneumothorax. The cardiac silhouette is within normal limits. Atherosclerotic calcification of the aorta. No acute osseous pathology. IMPRESSION: 1. No acute cardiopulmonary process. 2. Emphysema. Electronically Signed   By: Anner Crete M.D.   On: 07/22/2021 19:08    Pending Labs Unresulted Labs (From admission, onward)    None       Vitals/Pain Today's Vitals   07/23/21 0800 07/23/21 0907 07/23/21 1100 07/23/21 1150  BP: 140/85 123/72 (!) 118/57   Pulse: 84  74   Resp: (!) 23  (!) 22   Temp:      TempSrc:      SpO2: 94%  92%   Weight:      Height:      PainSc:    6     Isolation Precautions No active isolations  Medications Medications  SUMAtriptan (IMITREX) tablet 50 mg (50 mg Oral Given 07/23/21 1019)  doxazosin (CARDURA) tablet 4 mg (4 mg Oral Given 07/23/21 0907)  metoprolol tartrate (LOPRESSOR) tablet 25 mg (25 mg Oral Given 07/23/21 0908)  ALPRAZolam (XANAX) tablet 0.5 mg (0.5 mg Oral Given 07/22/21 2359)  QUEtiapine (SEROQUEL) tablet 50 mg (50 mg Oral Given 07/22/21 2355)  pantoprazole (PROTONIX) EC tablet 40 mg (40 mg Oral Given 07/23/21 0907)  apixaban (ELIQUIS) tablet 5 mg (5 mg Oral Given 07/23/21 0908)  vitamin B-12 (CYANOCOBALAMIN) tablet 2,000 mcg (2,000 mcg Oral Given 07/23/21 0908)  ferrous gluconate (FERGON) tablet 324 mg (324 mg Oral Given 07/23/21 1132)  carbidopa-levodopa (SINEMET IR) 25-100 MG per tablet immediate release 2 tablet (2 tablets Oral  Given 07/23/21 1305)  albuterol (PROVENTIL) (2.5 MG/3ML) 0.083% nebulizer solution 2.5 mg (2.5 mg Inhalation Given 07/23/21 0426)  umeclidinium bromide (INCRUSE ELLIPTA) 62.5 MCG/ACT 1 puff (1 puff Inhalation Given 07/23/21 0908)  methylPREDNISolone sodium succinate (SOLU-MEDROL) 40 mg/mL injection 40 mg (40 mg Intravenous Given 07/23/21 0908)    Followed by  predniSONE (DELTASONE) tablet 40 mg (has no administration in time range)  cefTRIAXone (ROCEPHIN) 2 g in sodium chloride 0.9 % 100 mL IVPB (0 g Intravenous Stopped 07/22/21 2301)  ipratropium-albuterol (DUONEB) 0.5-2.5 (3) MG/3ML nebulizer solution 3 mL (3 mLs Nebulization Given 07/23/21 1132)  acetaminophen (TYLENOL) tablet 650 mg (has no administration in time range)    Or  acetaminophen (TYLENOL) suppository 650 mg (has no administration in time range)  traZODone (DESYREL) tablet 25 mg (has no administration in time range)  magnesium hydroxide (MILK OF MAGNESIA) suspension 30 mL (has no administration in time range)  ondansetron (ZOFRAN) tablet 4 mg (has no administration in time range)    Or  ondansetron (ZOFRAN) injection 4 mg (has no administration in time range)  guaiFENesin (MUCINEX) 12 hr tablet 600 mg (600 mg Oral Given 07/23/21 0908)  chlorpheniramine-HYDROcodone 10-8 MG/5ML suspension 5 mL (has no administration in  time range)  albuterol (PROVENTIL) (2.5 MG/3ML) 0.083% nebulizer solution 5 mg (5 mg Nebulization Given 07/22/21 2002)    Mobility walks with person assist High fall risk   Focused Assessments Pulmonary Assessment Handoff:  Lung sounds: Bilateral Breath Sounds: Rhonchi L Breath Sounds: Rhonchi R Breath Sounds: Rhonchi O2 Device: Nasal Cannula O2 Flow Rate (L/min): 4 L/min    R Recommendations: See Admitting Provider Note  Report given to:   Additional Notes:

## 2021-07-23 NOTE — Progress Notes (Signed)
?  Transition of Care (TOC) Screening Note ? ? ?Patient Details  ?Name: Patrick Montoya ?Date of Birth: 28-Oct-1940 ? ? ?Transition of Care (TOC) CM/SW Contact:    ?Benard Halsted, LCSW ?Phone Number: ?07/23/2021, 3:31 PM ? ? ? ?Transition of Care Department [facility]) has reviewed patient and no TOC needs have been identified at this time. We will continue to monitor patient advancement through interdisciplinary progression rounds. If new patient transition needs arise, please place a TOC consult. ? ? ?

## 2021-07-23 NOTE — Progress Notes (Signed)
PROGRESS NOTE    Patrick Montoya  BWG:[id number] DOB: 12-18-1940 DOA: 07/22/2021 PCP: Tracie Harrier, MD   Brief Narrative:  Patrick Montoya is a 81 y.o. 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 He was recently evaluated by opthalmology and diagnosed with "a blood clot behind his eye" but denies any vision problems.   In ED patient required upwards of 6L [place] to maintain sats >90% - admitted for acute hypoxic respiratory failure secondary to COPD exacerbation vs CAP.  Assessment & Plan:   Principal Problem:   COPD exacerbation ([place]) Active Problems:   Acute on chronic respiratory failure with hypoxia (HCC)   Paroxysmal atrial fibrillation (HCC)   Parkinson's disease ([place])   Essential hypertension   History of hepatitis D   Acute on chronic hypoxic respiratory failure in setting of COPD exacerbation  Rule out community-acquired pneumonia  -Continue steroids, transition to p.o. and wean as appropriate -Initially placed on antibiotics, currently on hold given patient remains afebrile without leukocytosis, follow clinically -Chest x-ray without overt infiltrate or opacification -Continue to wean oxygen back to baseline: 3 L nasal cannula around-the-clock    Paroxysmal atrial fibrillation (HCC) -Currently rate controlled on metoprolol, continue Eliquis 5 twice daily   Vision impairment, questionable occlusive/thrombotic disease  -Patient reports left lateral vision loss/hemianopsia -Evaluated by ophthalmology just prior to admission with "blood clot behind his retina" -currently on Eliquis, patient indicates ophthalmology had no plan or intervention at  the time of discussion in the outpatient setting  Parkinson's disease (HCC)-Currently well controlled - continue Sinemet History of gastritis- We will continue PPI therapy Essential hypertension -currently well controlled on metoprolol, doxazosin   DVT prophylaxis: Eliquis Code Status: Full Family Communication: None present  Status is: Inpatient  Dispo: The patient is from: Home              Anticipated d/c is to: To be determined              Anticipated d/c date is: 48 to 72 hours              Patient currently not medically stable for discharge  Consultants:  None  Procedures:  None  Antimicrobials:  Discontinued  Subjective: No acute issues or events overnight denies nausea vomiting diarrhea constipation headache fevers chills or chest pain.  Shortness of breath ongoing but markedly improved since intake, otherwise left hemianopsia as above  Objective: Vitals:   07/23/21 0240 07/23/21 0500 07/23/21 0756 07/23/21 0800  BP: 102/63 132/78  140/85  Pulse: 78 72  84  Resp: (!) 23 (!) 29  (!) 23  Temp:   98.7 F (37.1 C)   TempSrc:   Rectal   SpO2: 93% 93%  94%  Weight:      Height:        Intake/Output Summary (Last 24 hours) at 07/23/2021 0804 Last data filed at 07/23/2021 0752 Gross per 24 hour  Intake --  Output 500 ml  Net -500 ml   Filed Weights   07/22/21 1833  Weight: 77.1 kg    Examination:  General:  Pleasantly resting in bed, No acute distress. HEENT:  Normocephalic atraumatic.  Left lateral visual field deficits; hemianopsia Neck:  Without mass or deformity.  Trachea is midline. Lungs: Diffuse bilateral wheeze with diminished breath sounds, without overt rales. Heart:  Regular rate and rhythm.  Without murmurs, rubs, or gallops. Abdomen:  Soft, nontender, nondistended.  Without guarding or rebound. Extremities: Without cyanosis, clubbing, edema, or obvious deformity. Vascular:  Dorsalis pedis and posterior tibial pulses palpable  bilaterally. Skin:  Warm and dry, no erythema, no ulcerations.  Data Reviewed: I have personally reviewed following labs and imaging studies  CBC: Recent Labs  Lab 07/22/21 1842 07/23/21 0450  WBC 7.7 8.6  HGB 14.6 13.7  HCT 46.5 43.0  MCV 105.9* 105.1*  PLT 141* 976*   Basic Metabolic Panel: Recent Labs  Lab 07/22/21 1842 07/23/21 0450  NA 145 144  K 4.6 4.0  CL 104 106  CO2 32 33*  GLUCOSE 101* 96  BUN 13 12  CREATININE 0.77 0.78  CALCIUM 9.1 8.8*   GFR: Estimated Creatinine Clearance: 74.8 mL/min (by C-G formula based on SCr of 0.78 mg/dL). Liver Function Tests: Recent Labs  Lab 07/22/21 1842  AST 24  ALT 10  ALKPHOS 67  BILITOT 0.5  PROT 7.3  ALBUMIN 3.7   No results for input(s): LIPASE, AMYLASE in the last 168 hours. No results for input(s): AMMONIA in the last 168 hours. Coagulation Profile: Recent Labs  Lab 07/22/21 1842  INR 1.1   Cardiac Enzymes: No results for input(s): CKTOTAL, CKMB, CKMBINDEX, TROPONINI in the last 168 hours. BNP (last 3 results) No results for input(s): PROBNP in the last 8760 hours. HbA1C: No results for input(s): HGBA1C in the last 72 hours. CBG: No results for input(s): GLUCAP in the last 168 hours. Lipid Profile: No results for input(s): CHOL, HDL, LDLCALC, TRIG, CHOLHDL, LDLDIRECT in the last 72 hours. Thyroid Function Tests: No results for input(s): TSH, T4TOTAL, FREET4, T3FREE, THYROIDAB in the last 72 hours. Anemia Panel: No results for input(s): VITAMINB12, FOLATE, FERRITIN, TIBC, IRON, RETICCTPCT in the last 72 hours. Sepsis Labs: Recent Labs  Lab 07/22/21 1841  LATICACIDVEN 1.0    Recent Results (from the past 240 hour(s))  Resp Panel by RT-PCR (Flu A&B, Covid) Nasopharyngeal Swab     Status: None   Collection Time: 07/22/21  6:25 PM   Specimen: Nasopharyngeal Swab; Nasopharyngeal(NP) swabs in vial transport medium  Result Value Ref Range Status   SARS Coronavirus 2 by RT PCR NEGATIVE NEGATIVE Final     Comment: (NOTE) SARS-CoV-2 target nucleic acids are NOT DETECTED.  The SARS-CoV-2 RNA is generally detectable in upper respiratory specimens during the acute phase of infection. The lowest concentration of SARS-CoV-2 viral copies this assay can detect is 138 copies/mL. A negative result does not preclude SARS-Cov-2 infection and should not be used as the sole basis for treatment or other patient management decisions. A negative result may occur with  improper specimen collection/handling, submission of specimen other than nasopharyngeal swab, presence of viral mutation(s) within the areas targeted by this assay, and inadequate number of viral copies(<138 copies/mL). A negative result must be combined with clinical observations, patient history, and epidemiological information. The expected result is Negative.  Fact Sheet for Patients:  EntrepreneurPulse.com.au  Fact Sheet for Healthcare Providers:  IncredibleEmployment.be  This test is no t yet approved or cleared by the [place] FDA and  has been authorized for detection and/or diagnosis of SARS-CoV-2 by FDA under an Emergency Use Authorization (EUA). This EUA will remain  in effect (meaning this test can be used) for the duration of the  COVID-19 declaration under Section 564(b)(1) of the Act, 21 U.S.C.section 360bbb-3(b)(1), unless the authorization is terminated  or revoked sooner.       Influenza A by PCR NEGATIVE NEGATIVE Final   Influenza B by PCR NEGATIVE NEGATIVE Final    Comment: (NOTE) The Xpert Xpress SARS-CoV-2/FLU/RSV plus assay is intended as an aid in the diagnosis of influenza from Nasopharyngeal swab specimens and should not be used as a sole basis for treatment. Nasal washings and aspirates are unacceptable for Xpert Xpress SARS-CoV-2/FLU/RSV testing.  Fact Sheet for Patients: EntrepreneurPulse.com.au  Fact Sheet for Healthcare  Providers: IncredibleEmployment.be  This test is not yet approved or cleared by the [place] FDA and has been authorized for detection and/or diagnosis of SARS-CoV-2 by FDA under an Emergency Use Authorization (EUA). This EUA will remain in effect (meaning this test can be used) for the duration of the COVID-19 declaration under Section 564(b)(1) of the Act, 21 U.S.C. section 360bbb-3(b)(1), unless the authorization is terminated or revoked.  Performed at [facility], [place] [address], [place], [place] [address]          Radiology Studies: DG Chest Port 1 View  Result Date: 07/22/2021 CLINICAL DATA:  Cough. EXAM: PORTABLE CHEST 1 VIEW COMPARISON:  Chest radiograph dated 06/29/2021. FINDINGS: Background of emphysema. Bilateral mid to lower lung field interstitial coarsening, atelectasis, and scarring. No focal consolidation, pleural effusion, or pneumothorax. The cardiac silhouette is within normal limits. Atherosclerotic calcification of the aorta. No acute osseous pathology. IMPRESSION: 1. No acute cardiopulmonary process. 2. Emphysema. Electronically Signed   By: Anner Crete M.D.   On: 07/22/2021 19:08        Scheduled Meds:  apixaban  5 mg Oral BID   carbidopa-levodopa  2 tablet Oral 5 X Daily   doxazosin  4 mg Oral Daily   ferrous gluconate  324 mg Oral Q breakfast   guaiFENesin  600 mg Oral BID   ipratropium-albuterol  3 mL Nebulization QID   methylPREDNISolone (SOLU-MEDROL) injection  40 mg Intravenous Q12H   Followed by   Derrill Memo ON 07/24/2021] predniSONE  40 mg Oral Q breakfast   metoprolol tartrate  25 mg Oral BID   pantoprazole  40 mg Oral Daily   QUEtiapine  50 mg Oral QHS   umeclidinium bromide  1 puff Inhalation Daily   vitamin B-12  2,000 mcg Oral Daily   Continuous Infusions:  sodium chloride 100 mL/hr at 07/22/21 2358   cefTRIAXone (ROCEPHIN)  IV Stopped (07/22/21 2301)     LOS: 1 day   Time spent: 37mn  Daizy Outen C  France Noyce, DO [facility]  If 7PM-7AM, please contact night-coverage [email or web address]  07/23/2021, 8:04 AM

## 2021-07-23 NOTE — ED Notes (Signed)
Breakfast order placed ?

## 2021-07-24 DIAGNOSIS — J441 Chronic obstructive pulmonary disease with (acute) exacerbation: Secondary | ICD-10-CM | POA: Diagnosis not present

## 2021-07-24 LAB — BASIC METABOLIC PANEL
Anion gap: 7 (ref 5–15)
BUN: 14 mg/dL (ref 8–23)
CO2: 31 mmol/L (ref 22–32)
Calcium: 9 mg/dL (ref 8.9–10.3)
Chloride: 102 mmol/L (ref 98–111)
Creatinine, Ser: 0.75 mg/dL (ref 0.61–1.24)
GFR, Estimated: >60 mL/min (ref >60)
Glucose, Bld: 150 mg/dL — ABNORMAL HIGH (ref 70–99)
Potassium: 4.3 mmol/L (ref 3.5–5.1)
Sodium: 140 mmol/L (ref 135–145)

## 2021-07-24 LAB — CBC
HCT: 40.5 % (ref 39.0–52.0)
Hemoglobin: 13.6 g/dL (ref 13.0–17.0)
MCH: 33.3 pg (ref 26.0–34.0)
MCHC: 33.6 g/dL (ref 30.0–36.0)
MCV: 99 fL (ref 80.0–100.0)
Platelets: 128 10*3/uL — ABNORMAL LOW (ref 150–400)
RBC: 4.09 MIL/uL — ABNORMAL LOW (ref 4.22–5.81)
RDW: 13.2 % (ref 11.5–15.5)
WBC: 10.2 10*3/uL (ref 4.0–10.5)
nRBC: 0 % (ref 0.0–0.2)

## 2021-07-24 MED ORDER — IPRATROPIUM-ALBUTEROL 0.5-2.5 (3) MG/3ML IN SOLN
3.0000 mL | Freq: Two times a day (BID) | RESPIRATORY_TRACT | Status: DC
  Administered 2021-07-24 – 2021-07-25 (×2): 3 mL via RESPIRATORY_TRACT
  Filled 2021-07-24 (×2): qty 3

## 2021-07-24 NOTE — TOC Initial Note (Signed)
Transition of Care (TOC) - Initial/Assessment Note  ? ? ?Patient Details  ?Name: Patrick Montoya ?MRN: [id number] ?Date of Birth: 12/05/1940 ? ?Transition of Care (TOC) CM/SW Contact:    ?Cyndi Bender, RN ?Phone Number: ?07/24/2021, 11:55 AM ? ?Clinical Narrative:                 ?Patient is active with [facility] for PT also patient has oxygen from [employer]. ?TOC will continue to follow ? ?Expected Discharge Plan: [place] ?Barriers to Discharge: Continued Medical Work up ? ? ?Patient Goals and CMS Choice ?  ?  ?  ? ?Expected Discharge Plan and Services ?Expected Discharge Plan: [place] ?  ?Discharge Planning Services: CM Consult ?Post Acute Care Choice: Durable Medical Equipment ?Living arrangements for the past 2 months: [place] ?                ?  ?  ?  ?  ?  ?  ?  ?  ?  ?  ? ?Prior Living Arrangements/Services ?Living arrangements for the past 2 months: [place] ?Lives with:: Spouse ?  ?       ?  ?  ?Current home services: DME (home oxygen from [place]) ?  ? ?Activities of Daily Living ?Home Assistive Devices/Equipment: None ?ADL Screening (condition at time of admission) ?Patient's cognitive ability adequate to safely complete daily activities?: Yes ?Is the patient deaf or have difficulty hearing?: No ?Does the patient have difficulty seeing, even when wearing glasses/contacts?: No ?Does the patient have difficulty concentrating, remembering, or making decisions?: No ?Patient able to express need for assistance with ADLs?: Yes ?Does the patient have difficulty dressing or bathing?: No ?Independently performs ADLs?: Yes (appropriate for developmental age) ?Does the patient have difficulty walking or climbing stairs?: No ?Weakness of Legs: None ?Weakness of Arms/Hands: None ? ?Permission Sought/Granted ?  ?  ?   ?   ?   ?   ? ?Emotional Assessment ?  ?  ?  ?  ?  ?  ? ?Admission diagnosis:  COPD exacerbation ([place]) [J44.1] ?Patient Active Problem List  ?  Diagnosis Date Noted  ? COPD exacerbation ([place]) 07/22/2021  ? Acute on chronic respiratory failure with hypoxia ([place]) 07/22/2021  ? Paroxysmal atrial fibrillation ([place]) 07/22/2021  ? Essential hypertension 07/22/2021  ? History of hepatitis D 07/22/2021  ? Chronic respiratory failure with hypoxia ([place]) 06/29/2021  ? History of gastric ulcer 06/29/2021  ? Paroxysmal atrial fibrillation with rapid ventricular response ([place]) 06/29/2021  ? Rapid atrial fibrillation ([place]) 06/29/2021  ? Long term (current) use of anticoagulants 06/20/2021  ? Chest pain   ? Acute respiratory failure with hypoxia ([place]) 02/26/2021  ? Sepsis due to undetermined organism ([place]) 02/26/2021  ? Bacterial pneumonia 02/26/2021  ? Parkinson's disease ([place]) 03/26/2019  ? Sternal fracture with retrosternal contusion, closed, initial encounter 10/31/2017  ? Chronic bronchitis ([place]) 05/28/2015  ? Gastro-esophageal reflux disease without esophagitis 05/26/2015  ? 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 COPD with acute exacerbation ([place]) 04/11/2014  ? Benign prostatic hypertrophy without urinary obstruction 01/21/2014  ? Acute bronchitis with chronic obstructive pulmonary disease (COPD) ([place]) 01/21/2014  ? Accelerated hypertension 01/21/2014  ? SINUSITIS- ACUTE-NOS 07/03/2007  ? RENAL CALCULUS 06/05/2007  ? HYPERTENSION 02/27/2007  ? COPD (chronic obstructive pulmonary disease) ([place]) 02/27/2007  ? GERD 02/27/2007  ? PEPTIC ULCER DISEASE 02/27/2007  ? BENIGN PROSTATIC HYPERTROPHY 02/27/2007  ? BURSITIS, RIGHT SHOULDER 02/27/2007  ?  COLONIC POLYPS, HX OF 02/27/2007  ? FROZEN RIGHT SHOULDER 12/05/2006  ? ?PCP:  Tracie Harrier, MD ?Pharmacy:   ?[facility] [place] ?[place] ?[place] [address] ?Phone: [phone] Fax: [phone] ? ?[facility] [place] ?[place] ([place])  [address] ?Phone: [phone] Fax: [phone] ? ? ? ? ?Social  Determinants of Health (SDOH) Interventions ?  ? ?Readmission Risk Interventions ?No flowsheet data found. ? ? ?

## 2021-07-24 NOTE — Evaluation (Signed)
Physical Therapy Evaluation ?Patient Details ?Name: Patrick Montoya ?MRN: [id number] ?DOB: 1941-02-02 ?Today's Date: 07/24/2021 ? ?History of Present Illness ? Patrick Montoya is a 81 y.o. 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 ?  ?Clinical Impression ? Patient received in bed, sleeping. Rouses easily and is agreeable to PT assessment. Patient is independent with bed mobility. Transfers with supervision only. Ambulated in room with supervision, no AD and line management. No lob. Limited by O2 saturations dropping to 87% on 5 liters of O2. (He is on 3 liters at home, which started one week ago.) Patient will continue to benefit from skilled PT while here to improve activity tolerance and endurance for safe return home.     ?   ? ?Recommendations for follow up therapy are one component of a multi-disciplinary discharge planning process, led by the attending physician.  Recommendations may be updated based on patient status, additional functional criteria and insurance authorization. ? ?Follow Up Recommendations Home health PT ? ?  ?Assistance Recommended at Discharge None  ?Patient can return home with the following ? Help with stairs or ramp for entrance;Assist for transportation ? ?  ?Equipment Recommendations None recommended by PT  ?Recommendations for Other Services ?    ?  ?Functional Status Assessment Patient has had a recent decline in their functional status and demonstrates the ability to make significant improvements in function in a reasonable and predictable amount of time.  ? ?  ?Precautions / Restrictions Precautions ?Precautions: None ?Precaution Comments: low fall ?Restrictions ?Weight Bearing Restrictions: No  ? ?  ? ?Mobility ? Bed  Mobility ?Overal bed mobility: Independent ?  ?  ?  ?  ?  ?  ?  ?  ? ?Transfers ?Overall transfer level: Independent ?Equipment used: None ?  ?  ?  ?  ?  ?  ?  ?  ?  ? ?Ambulation/Gait ?Ambulation/Gait assistance: Min guard ?Gait Distance (Feet): 30 Feet ?Assistive device: None ?Gait Pattern/deviations: Step-through pattern ?Gait velocity: decr ?  ?  ?General Gait Details: patient ambulated well in room, no AD, O2 sats down to 87% on 5 liters. ? ?Stairs ?  ?  ?  ?  ?  ? ?Wheelchair Mobility ?  ? ?Modified Rankin (Stroke Patients Only) ?  ? ?  ? ?Balance Overall balance assessment: No apparent balance deficits (not formally assessed) ?  ?  ?  ?  ?  ?  ?  ?  ?  ?  ?  ?  ?  ?  ?  ?  ?  ?  ?   ? ? ? ?Pertinent Vitals/Pain Pain Assessment ?Pain Assessment: No/denies pain  ? ? ?Home Living Family/patient expects to be discharged to:: Private residence ?Living Arrangements: Spouse/significant other ?Available Help at Discharge: Family;Available PRN/intermittently ?  ?  ?  ?  ?  ?  ?Home Equipment: None ?   ?  ?Prior Function Prior Level of Function : Independent/Modified Independent ?  ?  ?  ?  ?  ?  ?Mobility Comments: patient ambulates without AD at baseline. Shops, uses uber for transportation, does not drive. ?ADLs Comments: independent. helps his wife at home ?  ? ? ?Hand Dominance  ?   ? ?  ?Extremity/Trunk Assessment  ?  Upper Extremity Assessment ?Upper Extremity Assessment: Overall WFL for tasks assessed ?  ? ?Lower Extremity Assessment ?Lower Extremity Assessment: Overall WFL for tasks assessed ?  ? ?Cervical / Trunk Assessment ?Cervical / Trunk Assessment: Normal  ?Communication  ? Communication: No difficulties  ?Cognition Arousal/Alertness: Awake/alert ?Behavior During Therapy: [facility] for tasks assessed/performed ?Overall Cognitive Status: Within Functional Limits for tasks assessed ?  ?  ?  ?  ?  ?  ?  ?  ?  ?  ?  ?  ?  ?  ?  ?  ?  ?  ?  ? ?  ?General Comments   ? ?  ?Exercises    ? ?Assessment/Plan  ?  ?PT  Assessment Patient needs continued PT services  ?PT Problem List Decreased strength;Decreased activity tolerance;Cardiopulmonary status limiting activity ? ?   ?  ?PT Treatment Interventions Therapeutic exercise;Gait training;Stair training;Functional mobility training;Therapeutic activities;Patient/family education   ? ?PT Goals (Current goals can be found in the Care Plan section)  ?Acute Rehab PT Goals ?Patient Stated Goal: return home with wife ?PT Goal Formulation: With patient ?Time For Goal Achievement: 07/31/21 ?Potential to Achieve Goals: Good ? ?  ?Frequency Min 3X/week ?  ? ? ?Co-evaluation   ?  ?  ?  ?  ? ? ?  ?AM-PAC PT "6 Clicks" Mobility  ?Outcome Measure Help needed turning from your back to your side while in a flat bed without using bedrails?: None ?Help needed moving from lying on your back to sitting on the side of a flat bed without using bedrails?: None ?Help needed moving to and from a bed to a chair (including a wheelchair)?: A Little ?Help needed standing up from a chair using your arms (e.g., wheelchair or bedside chair)?: None ?Help needed to walk in hospital room?: A Little ?Help needed climbing 3-5 steps with a railing? : A Little ?6 Click Score: 21 ? ?  ?End of Session Equipment Utilized During Treatment: Oxygen ?Activity Tolerance: Patient tolerated treatment well ?Patient left: in bed;with call bell/phone within reach;with bed alarm set ?Nurse Communication: Mobility status;Other (comment) (patient would like RN to look at his IV) ?PT Visit Diagnosis: Muscle weakness (generalized) (M62.81) ?  ? ?Time: 4492-0100 ?PT Time Calculation (min) (ACUTE ONLY): 21 min ? ? ?Charges:   PT Evaluation ?$PT Eval Moderate Complexity: 1 Mod ?PT Treatments ?$Gait Training: 8-22 mins ?  ?   ? ? ?Amanda Cockayne, PT, GCS ?07/24/21,4:05 PM ? ?

## 2021-07-24 NOTE — Progress Notes (Signed)
?PROGRESS NOTE ? ? ? ?Patrick Montoya  KGY:[id number] DOB: 1940-06-17 DOA: 07/22/2021 ?PCP: Tracie Harrier, MD ? ? ?Brief Narrative:  ?Patrick GASSMANN is a 81 y.o. 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 He was recently evaluated by opthalmology and diagnosed with "a blood clot behind his eye" but denies any vision problems.  ? ?In ED patient required upwards of 6L [place] to maintain sats >90% - admitted for acute hypoxic respiratory failure secondary to COPD exacerbation vs CAP. ? ?Assessment & Plan: ?  ?Principal Problem: ?  COPD exacerbation ([place]) ?Active Problems: ?  Acute on chronic respiratory failure with hypoxia (HCC) ?  Paroxysmal atrial fibrillation (HCC) ?  Parkinson's disease ([place]) ?  Essential hypertension ?  History of hepatitis D ? ? ?Acute on chronic hypoxic respiratory failure in setting of COPD exacerbation  ?Rule out community-acquired pneumonia  ?-Continue steroids, transition to p.o. and wean as appropriate ?-Initially placed on antibiotics, currently on hold given patient remains afebrile without leukocytosis, follow clinically ?-Chest x-ray without overt infiltrate or opacification ?-Continue to wean oxygen back to baseline: 3 L nasal cannula around-the-clock ?   ?Paroxysmal atrial fibrillation (HCC) ?-Currently rate controlled on metoprolol, continue Eliquis 5 twice daily ?  ?Vision impairment, questionable occlusive/thrombotic disease  ?-Patient reports left lateral vision loss/hemianopsia ?-Evaluated by ophthalmology just prior to admission with "blood clot behind his retina" -currently on Eliquis, patient indicates ophthalmology had no plan or intervention at  the time of discussion in the outpatient setting -we will have the patient follow back up with outpatient ophthalmology at time of discharge ? ?Parkinson's disease (HCC)-Currently well controlled - continue Sinemet ?Questionable sundowning -remains A/O x4 today although family indicates patient has been having episodes of confusion and questionable hallucinations at home.  No indication of these episodes so far doing inpatient. ?History of gastritis- We will continue PPI therapy ?Essential hypertension -currently well controlled on metoprolol, doxazosin  ? ?DVT prophylaxis: Eliquis ?Code Status: Full ?Family Communication: None present ? ?Status is: Inpatient ? ?Dispo: The patient is from: Home ?             Anticipated d/c is to: To be determined ?             Anticipated d/c date is: 24-48 hours ?             Patient currently not medically stable for discharge ? ?Consultants:  ?None ? ?Procedures:  ?None ? ?Antimicrobials:  ?Discontinued ? ?Subjective: ?No acute issues or events overnight denies nausea vomiting diarrhea constipation headache fevers chills or chest pain.  Shortness of breath ongoing but markedly improved since intake, otherwise left hemianopsia as above ? ?Objective: ?Vitals:  ? 07/23/21 2000 07/23/21 2037 07/24/21 0000 07/24/21 0344  ?BP: 136/85  138/73 111/90  ?Pulse: 74 74 80 76  ?Resp: '20 20  16  '$ ?Temp: 98 ?F (36.7 ?C)  97.7 ?F (36.5 ?C) 97.7 ?F (36.5 ?C)  ?TempSrc: Oral  Oral Oral  ?SpO2: 94% 94% 92% 91%  ?Weight:      ?Height:      ? ? ?Intake/Output Summary (Last 24 hours) at 07/24/2021 0753 ?Last data filed at 07/24/2021 0500 ?Gross per 24 hour  ?Intake  1107.94 ml  ?Output 700 ml  ?Net 407.94 ml  ? ? ?Filed Weights  ? 07/22/21 1833  ?Weight: 77.1 kg  ? ? ?Examination: ? ?General:  Pleasantly resting in bed, No acute distress. ?HEENT:  Normocephalic atraumatic.  Left lateral visual field deficits/hemianopsia ?Neck:  Without mass or deformity.  Trachea is midline. ?Lungs: Diffuse bilateral  wheeze with diminished breath sounds, without overt rales. ?Heart:  Regular rate and rhythm.  Without murmurs, rubs, or gallops. ?Abdomen:  Soft, nontender, nondistended.  Without guarding or rebound. ?Extremities: Without cyanosis, clubbing, edema, or obvious deformity. ?Vascular:  Dorsalis pedis and posterior tibial pulses palpable bilaterally. ?Skin:  Warm and dry, no erythema, no ulcerations. ? ?Data Reviewed: I have personally reviewed following labs and imaging studies ? ?CBC: ?Recent Labs  ?Lab 07/22/21 ?1842 07/23/21 ?0450 07/24/21 ?7096  ?WBC 7.7 8.6 10.2  ?HGB 14.6 13.7 13.6  ?HCT 46.5 43.0 40.5  ?MCV 105.9* 105.1* 99.0  ?PLT 141* 136* 128*  ? ? ?Basic Metabolic Panel: ?Recent Labs  ?Lab 07/22/21 ?1842 07/23/21 ?0450 07/24/21 ?2836  ?NA 145 144 140  ?K 4.6 4.0 4.3  ?CL 104 106 102  ?CO2 32 33* 31  ?GLUCOSE 101* 96 150*  ?BUN '13 12 14  '$ ?CREATININE 0.77 0.78 0.75  ?CALCIUM 9.1 8.8* 9.0  ? ? ?GFR: ?Estimated Creatinine Clearance: 74.8 mL/min (by C-G formula based on SCr of 0.75 mg/dL). ?Liver Function Tests: ?Recent Labs  ?Lab 07/22/21 ?1842  ?AST 24  ?ALT 10  ?ALKPHOS 67  ?BILITOT 0.5  ?PROT 7.3  ?ALBUMIN 3.7  ? ? ?No results for input(s): LIPASE, AMYLASE in the last 168 hours. ?No results for input(s): AMMONIA in the last 168 hours. ?Coagulation Profile: ?Recent Labs  ?Lab 07/22/21 ?1842  ?INR 1.1  ? ? ?Cardiac Enzymes: ?No results for input(s): CKTOTAL, CKMB, CKMBINDEX, TROPONINI in the last 168 hours. ?BNP (last 3 results) ?No results for input(s): PROBNP in the last 8760 hours. ?HbA1C: ?No results for input(s): HGBA1C in the last 72 hours. ?CBG: ?No results for input(s): GLUCAP in the last 168 hours. ?Lipid Profile: ?No results for input(s): CHOL, HDL, LDLCALC, TRIG, CHOLHDL, LDLDIRECT in the last 72 hours. ?Thyroid Function Tests: ?No results for input(s): TSH, T4TOTAL, FREET4, T3FREE, THYROIDAB in the last 72 hours. ?Anemia Panel: ?No results for input(s): VITAMINB12, FOLATE, FERRITIN, TIBC, IRON,  RETICCTPCT in the last 72 hours. ?Sepsis Labs: ?Recent Labs  ?Lab 07/22/21 ?1841  ?LATICACIDVEN 1.0  ? ? ? ?Recent Results (from the past 240 hour(s))  ?Resp Panel by RT-PCR (Flu A&B, Covid) Nasopharyngeal Swab     Status: None  ? Collection Time: 07/22/21  6:25 PM  ? Specimen: Nasopharyngeal Swab; Nasopharyngeal(NP) swabs in vial transport medium  ?Result Value Ref Range Status  ? SARS Coronavirus 2 by RT PCR NEGATIVE NEGATIVE Final  ?  Comment: (NOTE) ?SARS-CoV-2 target nucleic acids are NOT DETECTED. ? ?The SARS-CoV-2 RNA is generally detectable in upper respiratory ?specimens during the acute phase of infection. The lowest ?concentration of SARS-CoV-2 viral copies this assay can detect is ?138 copies/mL. A negative result does not preclude SARS-Cov-2 ?infection and should not be used as the sole basis for treatment or ?other patient management decisions. A negative result may occur with  ?improper specimen collection/handling, submission of specimen other ?than nasopharyngeal swab, presence of viral mutation(s) within the ?areas targeted by this assay, and inadequate number of viral ?copies(<138 copies/mL). A negative result must be combined with ?clinical observations, patient history, and epidemiological ?information. The  expected result is Negative. ? ?Fact Sheet for Patients:  ?EntrepreneurPulse.com.au ? ?Fact Sheet for Healthcare Providers:  ?IncredibleEmployment.be ? ?This test is no t yet approved or cleared by the [place] FDA and  ?has been authorized for detection and/or diagnosis of SARS-CoV-2 by ?FDA under an Emergency Use Authorization (EUA). This EUA will remain  ?in effect (meaning this test can be used) for the duration of the ?COVID-19 declaration under Section 564(b)(1) of the Act, 21 ?U.S.C.section 360bbb-3(b)(1), unless the authorization is terminated  ?or revoked sooner.  ? ? ?  ? Influenza A by PCR NEGATIVE NEGATIVE Final  ? Influenza B by PCR  NEGATIVE NEGATIVE Final  ?  Comment: (NOTE) ?The Xpert Xpress SARS-CoV-2/FLU/RSV plus assay is intended as an aid ?in the diagnosis of influenza from Nasopharyngeal swab specimens and ?should not be used as a sole b

## 2021-07-25 ENCOUNTER — Other Ambulatory Visit (HOSPITAL_COMMUNITY): Payer: Self-pay

## 2021-07-25 MED ORDER — PREDNISONE 10 MG PO TABS: ORAL_TABLET | ORAL | 0 refills | Status: DC

## 2021-07-25 MED ORDER — TIOTROPIUM BROMIDE MONOHYDRATE 18 MCG IN CAPS: 18.0000 ug | ORAL_CAPSULE | Freq: Every day | RESPIRATORY_TRACT | 12 refills | Status: DC

## 2021-07-25 MED ORDER — ALBUTEROL SULFATE HFA 108 (90 BASE) MCG/ACT IN AERS: 2.0000 | INHALATION_SPRAY | Freq: Four times a day (QID) | RESPIRATORY_TRACT | 11 refills | Status: DC | PRN

## 2021-07-25 MED ORDER — BUDESONIDE-FORMOTEROL FUMARATE 160-4.5 MCG/ACT IN AERO
2.0000 | INHALATION_SPRAY | Freq: Two times a day (BID) | RESPIRATORY_TRACT | 3 refills | Status: DC
  Filled 2021-07-25: qty 10.2, 30d supply, fill #0

## 2021-07-25 MED ORDER — BUDESONIDE-FORMOTEROL FUMARATE 160-4.5 MCG/ACT IN AERO: 2.0000 | INHALATION_SPRAY | Freq: Two times a day (BID) | RESPIRATORY_TRACT | 3 refills | Status: DC

## 2021-07-25 MED ORDER — PREDNISONE 10 MG PO TABS
ORAL_TABLET | ORAL | 0 refills | Status: DC
  Filled 2021-07-25: qty 10, 4d supply, fill #0

## 2021-07-25 MED ORDER — TIOTROPIUM BROMIDE MONOHYDRATE 18 MCG IN CAPS
18.0000 ug | ORAL_CAPSULE | Freq: Every day | RESPIRATORY_TRACT | 12 refills | Status: DC
  Filled 2021-07-25: qty 30, 30d supply, fill #0

## 2021-07-25 MED ORDER — ALBUTEROL SULFATE HFA 108 (90 BASE) MCG/ACT IN AERS
2.0000 | INHALATION_SPRAY | Freq: Four times a day (QID) | RESPIRATORY_TRACT | 11 refills | Status: DC | PRN
  Filled 2021-07-25: qty 18, 25d supply, fill #0

## 2021-07-25 NOTE — Progress Notes (Signed)
Spoke with [facility] who said they need to discuss with patient cost of medications before sending. Patient made aware and room phone next to patient.  ?

## 2021-07-25 NOTE — Discharge Summary (Signed)
? ?PATIENT DETAILS ?Name: Patrick Montoya ?Age: 81 y.o. ?Sex: male ?Date of Birth: 1940-06-03 ?MRN: [id number]. ?Admitting Physician: Christel Mormon, MD ?[id number], Cherlyn Labella, MD ? ?Admit Date: 07/22/2021 ?Discharge date: 07/25/2021 ? ?Recommendations for Outpatient Follow-up:  ?Follow up with PCP in 1-2 weeks ?Please obtain CMP/CBC in one week ? ?Admitted From:  ?Home with home health services ? ?Disposition: ?Home ?  ?Discharge Condition: ?good ? ?CODE STATUS: ?  Code Status: Full Code  ? ?Diet recommendation:  ?Diet Order   ? ?       ?  Diet - low sodium heart healthy       ?  ?  Diet Heart Room service appropriate? Yes; Fluid consistency: Thin  Diet effective now       ?  ? ?  ?  ? ?  ?  ? ?Brief Summary: ?81 year old male with history of COPD on home O2 2 L/min, PAF on Eliquis, HTN who presented to hospital with cough, shortness of breath-patient was thought to have COPD exacerbation and admitted to the hospitalist service.  See below for further details. ? ?Brief Hospital Course: ?COPD exacerbation: Treated with steroids/bronchodilators and empiric antimicrobial therapy-patient claims that he feels much better this morning is requesting discharge.  He is back to his usual regimen of 3 L of oxygen.  Not felt to have PNA at this point.  Plans are to continue Spiriva-have added Symbicort-he is to continue using his rescue inhaler as usual.   ? ?PAF: Rate controlled-on Eliquis and metoprolol. ? ?Visual impairment: Patient reports he has been evaluated by ophthalmology prior to this hospitalization-and was told he had "blood clot behind his retina"-Per patient history ophthalmology plan no intervention-he has been asked to follow-up with his ophthalmologist postdischarge.  He is already on Eliquis. ? ?Parkinson's disease: Appears controlled-continue Sinemet ? ?HTN: Controlled-continue metoprolol, doxazosin ? ?BMI: ?Estimated body mass index is 24.39 kg/m? as calculated from the following: ?  Height as of this encounter:  '5\' 10"'$  (1.778 m). ?  Weight as of this encounter: 77.1 kg.  ? ? ?Discharge Diagnoses:  ?Principal Problem: ?  COPD exacerbation ([place]) ?Active Problems: ?  Acute on chronic respiratory failure with hypoxia (HCC) ?  Paroxysmal atrial fibrillation (HCC) ?  Parkinson's disease ([place]) ?  Essential hypertension ?  History of hepatitis D ? ? ?Discharge Instructions: ? ?Activity:  ?As tolerated with Full fall precautions use walker/cane & assistance as needed ? ?Discharge Instructions   ? ? Call MD for:  difficulty breathing, headache or visual disturbances   Complete by: As directed ?  ? Diet - low sodium heart healthy   Complete by: As directed ?  ? Discharge instructions   Complete by: As directed ?  ? Follow with Primary MD  Tracie Harrier, MD in 1-2 weeks ? ?Please follow-up with your primary ophthalmologist in the next 5-7 days ? ?Please get a complete blood count and chemistry panel checked by your Primary MD at your next visit, and again as instructed by your Primary MD. ? ?Get Medicines reviewed and adjusted: ?Please take all your medications with you for your next visit with your Primary MD ? ?Laboratory/radiological data: ?Please request your Primary MD to go over all hospital tests and procedure/radiological results at the follow up, please ask your Primary MD to get all Hospital records sent to his/her office. ? ?In some cases, they will be blood work, cultures and biopsy results pending at the time of your discharge. Please request that your  primary care M.D. follows up on these results. ? ?Also Note the following: ?If you experience worsening of your admission symptoms, develop shortness of breath, life threatening emergency, suicidal or homicidal thoughts you must seek medical attention immediately by calling 911 or calling your MD immediately  if symptoms less severe. ? ?You must read complete instructions/literature along with all the possible adverse reactions/side effects for all the Medicines you take  and that have been prescribed to you. Take any new Medicines after you have completely understood and accpet all the possible adverse reactions/side effects.  ? ?Do not drive when taking Pain medications or sleeping medications (Benzodaizepines) ? ?Do not take more than prescribed Pain, Sleep and Anxiety Medications. It is not advisable to combine anxiety,sleep and pain medications without talking with your primary care practitioner ? ?Special Instructions: If you have smoked or chewed Tobacco  in the last 2 yrs please stop smoking, stop any regular Alcohol  and or any Recreational drug use. ? ?Wear Seat belts while driving. ? ?Please note: ?You were cared for by a hospitalist during your hospital stay. Once you are discharged, your primary care physician will handle any further medical issues. Please note that NO REFILLS for any discharge medications will be authorized once you are discharged, as it is imperative that you return to your primary care physician (or establish a relationship with a primary care physician if you do not have one) for your post hospital discharge needs so that they can reassess your need for medications and monitor your lab values.  ? Increase activity slowly   Complete by: As directed ?  ? ?  ? ?Allergies as of 07/25/2021   ?No Known Allergies ?  ? ?  ?Medication List  ?  ? ?TAKE these medications   ? ?albuterol 108 (90 Base) MCG/ACT inhaler ?Commonly known as: VENTOLIN HFA ?Inhale 2 puffs into the lungs every 6 (six) hours as needed for wheezing or shortness of breath. ?  ?ALPRAZolam 0.5 MG tablet ?Commonly known as: Duanne Moron ?Take 0.5 mg by mouth at bedtime as needed for anxiety or sleep. ?  ?apixaban 5 MG Tabs tablet ?Commonly known as: ELIQUIS ?Take 1 tablet (5 mg total) by mouth 2 (two) times daily. ?  ?budesonide-formoterol 160-4.5 MCG/ACT inhaler ?Commonly known as: Symbicort ?Inhale 2 puffs into the lungs 2 (two) times daily. ?  ?carbidopa-levodopa 25-100 MG tablet ?Commonly known  as: SINEMET IR ?Take 2 tablets by mouth 5 (five) times daily. ?  ?[employer] Vitamin B-12 2000 MCG Tbcr ?Generic drug: Cyanocobalamin ?Take 2,000 mcg by mouth daily. ?  ?doxazosin 4 MG tablet ?Commonly known as: CARDURA ?Take 4 mg by mouth daily. ?  ?ferrous gluconate 324 MG tablet ?Commonly known as: FERGON ?Take 324 mg by mouth daily with breakfast. ?  ?metoprolol tartrate 25 MG tablet ?Commonly known as: LOPRESSOR ?Take 1 tablet (25 mg total) by mouth 2 (two) times daily. ?  ?omeprazole 40 MG capsule ?Commonly known as: PRILOSEC ?Take 40 mg by mouth daily. ?  ?predniSONE 10 MG tablet ?Commonly known as: DELTASONE ?Take 40 mg daily for 1 day, 30 mg daily for 1 day, 20 mg daily for 1 days,10 mg daily for 1 day, then stop ?  ?QUEtiapine 50 MG tablet ?Commonly known as: SEROQUEL ?Take 50 mg by mouth at bedtime. ?  ?rizatriptan 10 MG tablet ?Commonly known as: MAXALT ?Take 10 mg by mouth as needed for migraine. May repeat in 2 hours if needed ?  ?tiotropium 18 MCG inhalation  capsule ?Commonly known as: SPIRIVA ?Place 1 capsule (18 mcg total) into inhaler and inhale daily. ?  ? ?  ? ? Follow-up Information   ? ? Tracie Harrier, MD Follow up in 1 week(s).   ?Specialty: Internal Medicine ?Why: Hospital follow up ?Contact information: ?[address] ?[place] [address] ?[phone] ? ? ?  ?  ? ?  ?  ? ?  ? ?No Known Allergies ? ? ?Other Procedures/Studies: ?DG Chest Port 1 View ? ?Result Date: 07/22/2021 ?CLINICAL DATA:  Cough. EXAM: PORTABLE CHEST 1 VIEW COMPARISON:  Chest radiograph dated 06/29/2021. FINDINGS: Background of emphysema. Bilateral mid to lower lung field interstitial coarsening, atelectasis, and scarring. No focal consolidation, pleural effusion, or pneumothorax. The cardiac silhouette is within normal limits. Atherosclerotic calcification of the aorta. No acute osseous pathology. IMPRESSION: 1. No acute cardiopulmonary process. 2. Emphysema. Electronically Signed   By: Anner Crete M.D.   On: 07/22/2021 19:08  ? ?DG Chest Portable 1 View ? ?Result Date: 06/29/2021 ?CLINICAL DATA:  Afib rvr, chest pain, COPD. eval infiltrate EXAM: PORTABLE CHEST 1 VIEW COMPARISON:  Chest

## 2021-07-25 NOTE — Evaluation (Signed)
Occupational Therapy Evaluation ?Patient Details ?Name: Patrick Montoya ?MRN: [id number] ?DOB: 1940-07-31 ?Today's Date: 07/25/2021 ? ? ?History of Present Illness Patrick Montoya is a 81 y.o. 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  ? ?Clinical Impression ?  ?PTA patient was living with his spouse (for whom he is the primary caregiver for) and was grossly independent with ADLs/IADLs without AD. Patient currently functioning slightly below baseline demonstrating observed ADLs including LB dressing and toileting with CGA. Patient also limited by deficits listed below including decreased balance, mild safety awareness deficits and decreased cardiopulmonary endurance and would benefit from continued acute OT services in prep for safe d/c home with intermittent supervision/assist from family and [name]. OT will continue to follow acutely.    ? ?   ? ?Recommendations for follow up therapy are one component of a multi-disciplinary discharge planning process, led by the attending physician.  Recommendations may be updated based on patient status, additional functional criteria and insurance authorization.  ? ?Follow Up Recommendations ? Home health OT  ?  ?Assistance Recommended at Discharge Intermittent Supervision/Assistance  ?Patient can return home with the following   ? ?  ?Functional Status Assessment ? Patient has had a recent decline in their functional status and demonstrates the ability to make significant improvements in function in a reasonable and predictable amount of time.  ?Equipment Recommendations ? None recommended by OT  ?  ?Recommendations for Other Services   ? ? ?  ?Precautions / Restrictions Precautions ?Precautions: None ?Precaution  Comments: low fall ?Restrictions ?Weight Bearing Restrictions: No  ? ?  ? ?Mobility Bed Mobility ?Overal bed mobility: Independent ?  ?  ?  ?  ?  ?  ?  ?  ? ?Transfers ?Overall transfer level: Needs assistance ?Equipment used: None ?Transfers: Sit to/from Stand ?Sit to Stand: Min guard ?  ?  ?  ?  ?  ?General transfer comment: Sit to stand from EOB with Min guard for steadying. Mild posterior LOB once standing requiring assist to prevent fall. ?  ? ?  ?Balance Overall balance assessment: Mild deficits observed, not formally tested ?  ?  ?  ?  ?  ?  ?  ?  ?  ?  ?  ?  ?  ?  ?  ?  ?  ?  ?   ? ?ADL either performed or assessed with clinical judgement  ? ?ADL Overall ADL's : Needs assistance/impaired ?  ?  ?Grooming: Supervision/safety;Standing ?Grooming Details (indicate cue type and reason): 3/3 grooming tasks with unilateral UE support on sink surface. ?  ?  ?  ?  ?Upper Body Dressing : Set up;Sitting ?  ?Lower Body Dressing: Supervision/safety;Sit to/from stand ?  ?Toilet Transfer: Min guard ?Toilet Transfer Details (indicate cue type and reason): To standard height commode in bathroom with Min gaurd and no AD. ?Toileting- Water quality scientist and Hygiene: Min guard;Sit to/from stand ?Toileting - Clothing Manipulation Details (indicate cue type and reason): 3/3 parts of toileting task in standing with Min guard for steadying. ?  ?  ?  ?   ? ? ? ?Vision Baseline Vision/History: 1 Wears glasses ?Ability to See in Adequate Light: 2 Moderately impaired ?Patient Visual Report: No change from baseline ?Vision Assessment?: No apparent  visual deficits  ?   ?Perception   ?  ?Praxis   ?  ? ?Pertinent Vitals/Pain Pain Assessment ?Pain Assessment: No/denies pain  ? ? ? ?Hand Dominance Right ?  ?Extremity/Trunk Assessment Upper Extremity Assessment ?Upper Extremity Assessment: Overall WFL for tasks assessed ?  ?Lower Extremity Assessment ?Lower Extremity Assessment: Defer to PT evaluation ?  ?Cervical / Trunk  Assessment ?Cervical / Trunk Assessment: Normal ?  ?Communication Communication ?Communication: HOH ?  ?Cognition Arousal/Alertness: Awake/alert ?Behavior During Therapy: [facility] for tasks assessed/performed ?Overall Cognitive Status: Within Functional Limits for tasks assessed ?  ?  ?  ?  ?  ?  ?  ?  ?  ?  ?  ?  ?  ?  ?  ?  ?General Comments: A&Ox4 ?  ?  ?General Comments  SpO2 93% on 3L O2 via [place]. ? ?  ?Exercises   ?  ?Shoulder Instructions    ? ? ?Home Living Family/patient expects to be discharged to:: Private residence ?Living Arrangements: Spouse/significant other ?Available Help at Discharge: Family;Available PRN/intermittently ?Type of Home: House ?  ?  ?  ?Home Layout: One level ?  ?  ?Bathroom Shower/Tub: Tub/shower unit ?  ?Bathroom Toilet: Standard ?  ?  ?Home Equipment: Shower seat;BSC/3in1 ?  ?  ?  ? ?  ?Prior Functioning/Environment Prior Level of Function : Independent/Modified Independent ?  ?  ?  ?  ?  ?  ?  ?ADLs Comments: Independent with ADLs/IADLs. Primary caregiver for his wife. ?  ? ?  ?  ?OT Problem List: Impaired balance (sitting and/or standing);Decreased safety awareness ?  ?   ?OT Treatment/Interventions: Self-care/ADL training;Therapeutic exercise;DME and/or AE instruction;Therapeutic activities;Patient/family education;Balance training  ?  ?OT Goals(Current goals can be found in the care plan section) Acute Rehab OT Goals ?Patient Stated Goal: To return home. ?OT Goal Formulation: With patient ?Time For Goal Achievement: 08/08/21 ?Potential to Achieve Goals: Good  ?OT Frequency: Min 2X/week ?  ? ?Co-evaluation   ?  ?  ?  ?  ? ?  ?AM-PAC OT "6 Clicks" Daily Activity     ?Outcome Measure Help from another person eating meals?: None ?Help from another person taking care of personal grooming?: A Little ?Help from another person toileting, which includes using toliet, bedpan, or urinal?: A Little ?Help from another person bathing (including washing, rinsing, drying)?: A Little ?Help from  another person to put on and taking off regular upper body clothing?: A Little ?Help from another person to put on and taking off regular lower body clothing?: A Little ?6 Click Score: 19 ?  ?End of Session Equipment Utilized During Treatment: Gait belt;Oxygen (3L O2 via [place]) ?Nurse Communication: Mobility status ? ?Activity Tolerance: Patient tolerated treatment well ?Patient left: in chair;with call bell/phone within reach ? ?OT Visit Diagnosis: Other abnormalities of gait and mobility (R26.89)  ?              ?Time: 4818-5631 ?OT Time Calculation (min): 39 min ?Charges:  OT General Charges ?$OT Visit: 1 Visit ?OT Evaluation ?$OT Eval Moderate Complexity: 1 Mod ?OT Treatments ?$Self Care/Home Management : 23-37 mins ? ?Reanna Scoggin H. OTR/L ?Supplemental OT, Department of rehab services [phone] ?Jodye Scali R H. ?07/25/2021, 10:55 AM ?

## 2021-07-30 ENCOUNTER — Other Ambulatory Visit (HOSPITAL_COMMUNITY): Payer: Self-pay

## 2021-08-01 ENCOUNTER — Inpatient Hospital Stay
Admission: EM | Admit: 2021-08-01 | Discharge: 2021-08-04 | DRG: 871 | Disposition: A | Discharge status: Home Health | Payer: Medicare Other | Attending: Internal Medicine | Admitting: Internal Medicine

## 2021-08-01 ENCOUNTER — Encounter: Payer: Self-pay | Admitting: Cardiology

## 2021-08-01 ENCOUNTER — Emergency Department: Payer: Medicare Other

## 2021-08-01 ENCOUNTER — Encounter: Payer: Self-pay | Admitting: Emergency Medicine

## 2021-08-01 ENCOUNTER — Ambulatory Visit (INDEPENDENT_AMBULATORY_CARE_PROVIDER_SITE_OTHER): Payer: Medicare Other | Admitting: Cardiology

## 2021-08-01 ENCOUNTER — Other Ambulatory Visit: Payer: Self-pay

## 2021-08-01 VITALS — BP 130/64 | HR 78 | Temp 102.2°F | Ht 70.5 in | Wt 177.0 lb

## 2021-08-01 DIAGNOSIS — Z79899 Other long term (current) drug therapy: Secondary | ICD-10-CM

## 2021-08-01 DIAGNOSIS — Z9981 Dependence on supplemental oxygen: Secondary | ICD-10-CM

## 2021-08-01 DIAGNOSIS — G20 Parkinson's disease: Secondary | ICD-10-CM | POA: Diagnosis present

## 2021-08-01 DIAGNOSIS — F419 Anxiety disorder, unspecified: Secondary | ICD-10-CM | POA: Diagnosis present

## 2021-08-01 DIAGNOSIS — Z8711 Personal history of peptic ulcer disease: Secondary | ICD-10-CM

## 2021-08-01 DIAGNOSIS — J431 Panlobular emphysema: Secondary | ICD-10-CM | POA: Diagnosis present

## 2021-08-01 DIAGNOSIS — Z87891 Personal history of nicotine dependence: Secondary | ICD-10-CM | POA: Diagnosis not present

## 2021-08-01 DIAGNOSIS — Z8619 Personal history of other infectious and parasitic diseases: Secondary | ICD-10-CM

## 2021-08-01 DIAGNOSIS — Z20822 Contact with and (suspected) exposure to covid-19: Secondary | ICD-10-CM | POA: Diagnosis present

## 2021-08-01 DIAGNOSIS — A419 Sepsis, unspecified organism: Principal | ICD-10-CM | POA: Diagnosis present

## 2021-08-01 DIAGNOSIS — I480 Paroxysmal atrial fibrillation: Secondary | ICD-10-CM

## 2021-08-01 DIAGNOSIS — J690 Pneumonitis due to inhalation of food and vomit: Secondary | ICD-10-CM | POA: Diagnosis present

## 2021-08-01 DIAGNOSIS — R509 Fever, unspecified: Secondary | ICD-10-CM

## 2021-08-01 DIAGNOSIS — Z7951 Long term (current) use of inhaled steroids: Secondary | ICD-10-CM

## 2021-08-01 DIAGNOSIS — Z9079 Acquired absence of other genital organ(s): Secondary | ICD-10-CM

## 2021-08-01 DIAGNOSIS — I10 Essential (primary) hypertension: Secondary | ICD-10-CM | POA: Diagnosis not present

## 2021-08-01 DIAGNOSIS — F32A Depression, unspecified: Secondary | ICD-10-CM | POA: Diagnosis present

## 2021-08-01 DIAGNOSIS — J189 Pneumonia, unspecified organism: Secondary | ICD-10-CM | POA: Diagnosis not present

## 2021-08-01 DIAGNOSIS — Z85828 Personal history of other malignant neoplasm of skin: Secondary | ICD-10-CM | POA: Diagnosis not present

## 2021-08-01 DIAGNOSIS — N400 Enlarged prostate without lower urinary tract symptoms: Secondary | ICD-10-CM | POA: Diagnosis present

## 2021-08-01 DIAGNOSIS — K219 Gastro-esophageal reflux disease without esophagitis: Secondary | ICD-10-CM | POA: Diagnosis present

## 2021-08-01 DIAGNOSIS — Z800 Family history of malignant neoplasm of digestive organs: Secondary | ICD-10-CM

## 2021-08-01 DIAGNOSIS — Z9049 Acquired absence of other specified parts of digestive tract: Secondary | ICD-10-CM

## 2021-08-01 DIAGNOSIS — J9621 Acute and chronic respiratory failure with hypoxia: Secondary | ICD-10-CM | POA: Diagnosis present

## 2021-08-01 DIAGNOSIS — J441 Chronic obstructive pulmonary disease with (acute) exacerbation: Secondary | ICD-10-CM | POA: Diagnosis not present

## 2021-08-01 DIAGNOSIS — J432 Centrilobular emphysema: Secondary | ICD-10-CM | POA: Diagnosis present

## 2021-08-01 LAB — BRAIN NATRIURETIC PEPTIDE: B Natriuretic Peptide: 81.5 pg/mL (ref 0.0–100.0)

## 2021-08-01 LAB — STREP PNEUMONIAE URINARY ANTIGEN: Strep Pneumo Urinary Antigen: NEGATIVE

## 2021-08-01 LAB — TROPONIN I (HIGH SENSITIVITY)
Troponin I (High Sensitivity): 18 ng/L — ABNORMAL HIGH (ref <18)
Troponin I (High Sensitivity): 16 ng/L (ref <18)

## 2021-08-01 LAB — PROCALCITONIN: Procalcitonin: <0.1 ng/mL

## 2021-08-01 LAB — RESP PANEL BY RT-PCR (FLU A&B, COVID) ARPGX2
Influenza A by PCR: NEGATIVE
Influenza B by PCR: NEGATIVE
SARS Coronavirus 2 by RT PCR: NEGATIVE

## 2021-08-01 LAB — LACTIC ACID, PLASMA
Lactic Acid, Venous: 0.9 mmol/L (ref 0.5–1.9)
Lactic Acid, Venous: 0.8 mmol/L (ref 0.5–1.9)

## 2021-08-01 MED ORDER — IPRATROPIUM-ALBUTEROL 0.5-2.5 (3) MG/3ML IN SOLN
3.0000 mL | RESPIRATORY_TRACT | Status: DC
Start: 2021-08-01 — End: 2021-08-02
  Administered 2021-08-01 – 2021-08-02 (×6): 3 mL via RESPIRATORY_TRACT
  Filled 2021-08-01 (×6): qty 3

## 2021-08-01 MED ORDER — IOHEXOL 350 MG/ML SOLN
75.0000 mL | Freq: Once | INTRAVENOUS | Status: AC | PRN
  Administered 2021-08-01: 75 mL via INTRAVENOUS

## 2021-08-01 MED ORDER — VANCOMYCIN HCL 2000 MG/400ML IV SOLN
2000.0000 mg | INTRAVENOUS | Status: DC
Start: 2021-08-02 — End: 2021-08-03
  Administered 2021-08-02: 2000 mg via INTRAVENOUS
  Filled 2021-08-01: qty 400

## 2021-08-01 MED ORDER — METHYLPREDNISOLONE SODIUM SUCC 40 MG IJ SOLR
40.0000 mg | Freq: Two times a day (BID) | INTRAMUSCULAR | Status: DC
  Administered 2021-08-02 (×2): 40 mg via INTRAVENOUS
  Filled 2021-08-01 (×2): qty 1

## 2021-08-01 MED ORDER — DOXAZOSIN MESYLATE 4 MG PO TABS
4.0000 mg | ORAL_TABLET | Freq: Every day | ORAL | Status: DC
  Administered 2021-08-02 – 2021-08-04 (×3): 4 mg via ORAL
  Filled 2021-08-01 (×3): qty 1

## 2021-08-01 MED ORDER — DM-GUAIFENESIN ER 30-600 MG PO TB12
1.0000 | ORAL_TABLET | Freq: Two times a day (BID) | ORAL | Status: DC | PRN
Start: 2021-08-01 — End: 2021-08-04

## 2021-08-01 MED ORDER — VITAMIN B-12 1000 MCG PO TABS
2000.0000 ug | ORAL_TABLET | Freq: Every day | ORAL | Status: DC
  Administered 2021-08-02 – 2021-08-04 (×3): 2000 ug via ORAL
  Filled 2021-08-01 (×3): qty 2

## 2021-08-01 MED ORDER — IPRATROPIUM-ALBUTEROL 0.5-2.5 (3) MG/3ML IN SOLN
6.0000 mL | Freq: Once | RESPIRATORY_TRACT | Status: AC
  Administered 2021-08-01: 6 mL via RESPIRATORY_TRACT
  Filled 2021-08-01: qty 3

## 2021-08-01 MED ORDER — IPRATROPIUM-ALBUTEROL 0.5-2.5 (3) MG/3ML IN SOLN
3.0000 mL | Freq: Once | RESPIRATORY_TRACT | Status: AC
  Administered 2021-08-01: 3 mL via RESPIRATORY_TRACT
  Filled 2021-08-01: qty 3

## 2021-08-01 MED ORDER — ALPRAZOLAM 0.5 MG PO TABS
0.5000 mg | ORAL_TABLET | Freq: Every evening | ORAL | Status: DC | PRN
  Administered 2021-08-01 – 2021-08-02 (×2): 0.5 mg via ORAL
  Filled 2021-08-01 (×2): qty 1

## 2021-08-01 MED ORDER — METOPROLOL TARTRATE 25 MG PO TABS
25.0000 mg | ORAL_TABLET | Freq: Two times a day (BID) | ORAL | Status: DC
  Administered 2021-08-01 – 2021-08-04 (×6): 25 mg via ORAL
  Filled 2021-08-01 (×6): qty 1

## 2021-08-01 MED ORDER — HYDRALAZINE HCL 20 MG/ML IJ SOLN: 5.0000 mg | INTRAMUSCULAR | Status: DC | PRN

## 2021-08-01 MED ORDER — SODIUM CHLORIDE 0.9 % IV BOLUS
1000.0000 mL | Freq: Once | INTRAVENOUS | Status: AC
Start: 2021-08-01 — End: 2021-08-01
  Administered 2021-08-01: 1000 mL via INTRAVENOUS

## 2021-08-01 MED ORDER — SODIUM CHLORIDE 0.9 % IV SOLN
500.0000 mg | Freq: Once | INTRAVENOUS | Status: AC
  Administered 2021-08-01: 500 mg via INTRAVENOUS
  Filled 2021-08-01: qty 5

## 2021-08-01 MED ORDER — MOMETASONE FURO-FORMOTEROL FUM 200-5 MCG/ACT IN AERO
2.0000 | INHALATION_SPRAY | Freq: Two times a day (BID) | RESPIRATORY_TRACT | Status: DC
  Administered 2021-08-01 – 2021-08-04 (×6): 2 via RESPIRATORY_TRACT
  Filled 2021-08-01: qty 8.8

## 2021-08-01 MED ORDER — PANTOPRAZOLE SODIUM 40 MG PO TBEC
40.0000 mg | DELAYED_RELEASE_TABLET | Freq: Every day | ORAL | Status: DC
Start: 2021-08-02 — End: 2021-08-04
  Administered 2021-08-02 – 2021-08-04 (×3): 40 mg via ORAL
  Filled 2021-08-01 (×3): qty 1

## 2021-08-01 MED ORDER — VANCOMYCIN HCL 1750 MG/350ML IV SOLN
1750.0000 mg | Freq: Once | INTRAVENOUS | Status: AC
  Administered 2021-08-01: 1750 mg via INTRAVENOUS
  Filled 2021-08-01: qty 350

## 2021-08-01 MED ORDER — METHYLPREDNISOLONE SODIUM SUCC 125 MG IJ SOLR
125.0000 mg | Freq: Once | INTRAMUSCULAR | Status: AC
  Administered 2021-08-01: 125 mg via INTRAVENOUS
  Filled 2021-08-01: qty 2

## 2021-08-01 MED ORDER — ALBUMIN HUMAN 25 % IV SOLN
12.5000 g | Freq: Once | INTRAVENOUS | Status: AC
  Administered 2021-08-01: 12.5 g via INTRAVENOUS
  Filled 2021-08-01: qty 50

## 2021-08-01 MED ORDER — ONDANSETRON HCL 4 MG/2ML IJ SOLN
4.0000 mg | Freq: Three times a day (TID) | INTRAMUSCULAR | Status: DC | PRN
Start: 2021-08-01 — End: 2021-08-04

## 2021-08-01 MED ORDER — CARBIDOPA-LEVODOPA 25-100 MG PO TABS
2.0000 | ORAL_TABLET | Freq: Every day | ORAL | Status: DC
  Administered 2021-08-01 – 2021-08-04 (×14): 2 via ORAL
  Filled 2021-08-01 (×15): qty 2

## 2021-08-01 MED ORDER — SODIUM CHLORIDE 0.9 % IV SOLN
1.0000 g | Freq: Once | INTRAVENOUS | Status: AC
  Administered 2021-08-01: 1 g via INTRAVENOUS
  Filled 2021-08-01: qty 10

## 2021-08-01 MED ORDER — SODIUM CHLORIDE 0.9 % IV SOLN
2.0000 g | Freq: Three times a day (TID) | INTRAVENOUS | Status: DC
  Administered 2021-08-02 – 2021-08-03 (×4): 2 g via INTRAVENOUS
  Filled 2021-08-01 (×6): qty 2

## 2021-08-01 MED ORDER — QUETIAPINE FUMARATE 25 MG PO TABS
50.0000 mg | ORAL_TABLET | Freq: Every day | ORAL | Status: DC
  Administered 2021-08-01 – 2021-08-03 (×3): 50 mg via ORAL
  Filled 2021-08-01 (×3): qty 2

## 2021-08-01 MED ORDER — ACETAMINOPHEN 325 MG PO TABS: 650.0000 mg | ORAL_TABLET | Freq: Four times a day (QID) | ORAL | Status: DC | PRN

## 2021-08-01 MED ORDER — FERROUS GLUCONATE 324 (38 FE) MG PO TABS
324.0000 mg | ORAL_TABLET | Freq: Every day | ORAL | Status: DC
  Administered 2021-08-02 – 2021-08-04 (×3): 324 mg via ORAL
  Filled 2021-08-01 (×3): qty 1

## 2021-08-01 MED ORDER — APIXABAN 5 MG PO TABS: 5.0000 mg | ORAL_TABLET | Freq: Two times a day (BID) | ORAL | Status: DC

## 2021-08-01 NOTE — ED Notes (Signed)
EDP at bedside  

## 2021-08-01 NOTE — H&P (Addendum)
?History and Physical  ? ? ?Patrick Montoya RWE:[id number] DOB: 29-Mar-1941 DOA: 08/01/2021 ? ?Referring MD/NP/PA:  ? ?PCP: Tracie Harrier, MD  ? ?Patient coming from:  The patient is coming from home.  At baseline, pt is independent for most of ADL.       ? ?Chief Complaint: Fever, shortness breath ? ?HPI: Patrick Montoya is a 81 y.o. male with medical history significant of hypertension, COPD on 3 L oxygen at home, GERD, depression, anxiety, BPH, Parkinson's disease, HBV, gastric ulcer disease, PAF on Eliquis (just switched to Xarelto), who presents with fever, shortness breath. ? ?Patient was recently hospitalized from 3/12 - 3/15 due to COPD exacerbation.  She has shortness breath and productive cough with yellow-colored sputum production for about 1 week, which has been progressively worsening.  Patient has mild left-sided pleuritic sharp chest pain.  Denies nausea, vomiting, diarrhea or abdominal pain.  No symptoms of UTI.  Patient was seen by cardiologist in the office today, and was sent to ED for further evaluation and treatment.  Of note, patient used to be on Eliquis for atrial fibrillation, which was switched to Xarelto, but the patient states that he is taking both currently. Pt has fever and chills. ? ?Data Reviewed and ED Course: pt was found to have WBC 10.3, negative COVID PCR, lactic acid 0.9, 0.8, troponin level 18, 16, BNP 81.5, GFR > 60, temperature 102.2, blood pressure 128/85, heart rate 80, RR 30, oxygen saturation 90-96% on 4 L oxygen.  Chest x-ray showed infiltration in right lower lobe and left lower lobe.  CT angiogram is negative for PE, but showed multifocal infiltration.  Patient is admitted to telemetry bed as inpatient ? ?EKG: I have personally reviewed.  Sinus rhythm, QTc 418, low voltage, nonspecific T wave change ? ? ?Review of Systems:  ? ?General: has fevers, chills, no body weight gain, has poor appetite, has fatigue ?HEENT: no blurry vision, hearing changes or sore  throat ?Respiratory: has dyspnea, coughing, wheezing ?CV: has chest pain, no palpitations ?GI: no nausea, vomiting, abdominal pain, diarrhea, constipation ?GU: no dysuria, burning on urination, increased urinary frequency, hematuria  ?Ext: no leg edema ?Neuro: no unilateral weakness, numbness, or tingling, no vision change or hearing loss ?Skin: no rash, no skin tear. ?MSK: No muscle spasm, no deformity, no limitation of range of movement in spin ?Heme: No easy bruising.  ?Travel history: No recent long distant travel. ? ? ?Allergy: No Known Allergies ? ?Past Medical History:  ?Diagnosis Date  ? Basal cell carcinoma 07/25/2015  ? L zygoma inf to lat canthus  ? Basal cell carcinoma 06/11/2018  ? L nasal ala  ? BPH (benign prostatic hyperplasia)   ? COPD (chronic obstructive pulmonary disease) ([place])   ? Elevated liver enzymes   ? Emphysema of lung ([place])   ? Gastric ulcer   ? GERD (gastroesophageal reflux disease)   ? Hepatitis B   ? Hypertension   ? Parkinson's disease ([place])   ? Prostate hypertrophy   ? Rotator cuff tear right  ? UTI (urinary tract infection)   ? ? ?Past Surgical History:  ?Procedure Laterality Date  ? CHOLECYSTECTOMY    ? COLONOSCOPY WITH PROPOFOL N/A 10/20/2015  ? Procedure: COLONOSCOPY WITH PROPOFOL;  Surgeon: Manya Silvas, MD;  Location: [facility] ENDOSCOPY;  Service: Endoscopy;  Laterality: N/A;  ? COLONOSCOPY WITH PROPOFOL N/A 01/24/2021  ? Procedure: COLONOSCOPY WITH PROPOFOL;  Surgeon: Toledo, Benay Pike, MD;  Location: [facility] ENDOSCOPY;  Service: Gastroenterology;  Laterality:  N/A;  ? ESOPHAGOGASTRODUODENOSCOPY (EGD) WITH PROPOFOL N/A 10/20/2015  ? Procedure: ESOPHAGOGASTRODUODENOSCOPY (EGD) WITH PROPOFOL;  Surgeon: Manya Silvas, MD;  Location: [facility] ENDOSCOPY;  Service: Endoscopy;  Laterality: N/A;  ? GREEN LIGHT LASER TURP (TRANSURETHRAL RESECTION OF PROSTATE N/A 11/17/2018  ? Procedure: GREEN LIGHT LASER TURP (TRANSURETHRAL RESECTION OF PROSTATE;  Surgeon: Royston Cowper, MD;  Location: [facility]  ORS;  Service: Urology;  Laterality: N/A;  ? INGUINAL HERNIA REPAIR Right 02/18/2019  ? Procedure: HERNIA REPAIR INGUINAL ADULT;  Surgeon: Royston Cowper, MD;  Location: [facility] ORS;  Service: Urology;  Laterality: Right;  ? INSERTION OF MESH Right 02/18/2019  ? Procedure: INSERTION OF MESH;  Surgeon: Royston Cowper, MD;  Location: [facility] ORS;  Service: Urology;  Laterality: Right;  ? TONSILLECTOMY    ? TRANSURETHRAL MICROWAVE THERAPY    ? ? ?Social History:  reports that he has quit smoking. His smoking use included cigarettes. He has a 62.00 pack-year smoking history. He has never used smokeless tobacco. He reports current alcohol use. He reports that he does not use drugs. ? ?Family History:  ?Family History  ?Problem Relation Age of Onset  ? Colon cancer Mother   ? Hepatitis Son   ? Prostate cancer Neg Hx   ? Kidney cancer Neg Hx   ?  ? ?Prior to Admission medications   ?Medication Sig Start Date End Date Taking? Authorizing Provider  ?albuterol (VENTOLIN HFA) 108 (90 Base) MCG/ACT inhaler Inhale 2 puffs into the lungs every 6 (six) hours as needed for wheezing or shortness of breath. 07/25/21 07/25/22  Ghimire, Henreitta Leber, MD  ?ALPRAZolam Duanne Moron) 0.5 MG tablet Take 0.5 mg by mouth at bedtime as needed for anxiety or sleep.    [provider]  ?apixaban (ELIQUIS) 5 MG TABS tablet Take 1 tablet (5 mg total) by mouth 2 (two) times daily. ?Patient not taking: Reported on 08/01/2021 03/10/21   Val Riles, MD  ?budesonide-formoterol [facility]) 160-4.5 MCG/ACT inhaler Inhale 2 puffs into the lungs 2 (two) times daily. 07/25/21   Ghimire, Henreitta Leber, MD  ?carbidopa-levodopa (SINEMET IR) 25-100 MG tablet Take 2 tablets by mouth 5 (five) times daily.    [provider]  ?Cyanocobalamin ([employer] VITAMIN B-12) 2000 MCG TBCR Take 2,000 mcg by mouth daily.    [provider]  ?doxazosin (CARDURA) 4 MG tablet Take 4 mg by mouth daily.     [provider]  ?ferrous gluconate (FERGON) 324 MG tablet  Take 324 mg by mouth daily with breakfast.    [provider]  ?metoprolol tartrate (LOPRESSOR) 25 MG tablet Take 1 tablet (25 mg total) by mouth 2 (two) times daily. 03/10/21 08/01/21  Val Riles, MD  ?omeprazole (PRILOSEC) 40 MG capsule Take 40 mg by mouth daily.  11/06/15   [provider]  ?QUEtiapine (SEROQUEL) 50 MG tablet Take 50 mg by mouth at bedtime.    [provider]  ?rizatriptan (MAXALT) 10 MG tablet Take 10 mg by mouth as needed for migraine. May repeat in 2 hours if needed    [provider]  ?tiotropium (SPIRIVA) 18 MCG inhalation capsule Place 1 capsule (18 mcg total) into inhaler and inhale daily. 07/25/21   Ghimire, Henreitta Leber, MD  ? ? ?Physical Exam: ?Vitals:  ? 08/01/21 1400 08/01/21 1430 08/01/21 1500 08/01/21 1703  ?BP: 131/77 107/62 122/76 130/83  ?Pulse: 70 77 77 65  ?Resp: 19 (!) '22 17 15  '$ ?Temp:   98.2 ?F (36.8 ?C) (!) 97.5 ?F (  36.4 ?C)  ?TempSrc:   Oral   ?SpO2: 96% 91% 92% 94%  ?Weight:      ?Height:      ? ?General: Not in acute distress ?HEENT: ?      Eyes: PERRL, EOMI, no scleral icterus. ?      ENT: No discharge from the ears and nose, no pharynx injection, no tonsillar enlargement.  ?      Neck: No JVD, no bruit, no mass felt. ?Heme: No neck lymph node enlargement. ?Cardiac: S1/S2, RRR, No murmurs, No gallops or rubs. ?Respiratory: has wheezing bilaterally ?GI: Soft, nondistended, nontender, no rebound pain, no organomegaly, BS present. ?GU: No hematuria ?Ext: No pitting leg edema bilaterally. 1+DP/PT pulse bilaterally. ?Musculoskeletal: No joint deformities, No joint redness or warmth, no limitation of ROM in spin. ?Skin: No rashes.  ?Neuro: Alert, oriented X3, cranial nerves II-XII grossly intact, moves all extremities normally. ?Psych: Patient is not psychotic, no suicidal or hemocidal ideation. ? ?Labs on Admission: I have personally reviewed following labs and imaging studies ? ?CBC: ?No results for input(s): WBC, NEUTROABS, HGB, HCT, MCV,  PLT in the last 168 hours. ?Basic Metabolic Panel: ?No results for input(s): NA, K, CL, CO2, GLUCOSE, BUN, CREATININE, CALCIUM, MG, PHOS in the last 168 hours. ?GFR: ?Estimated Creatinine Clearance: 76 mL/min (by C-G fo

## 2021-08-01 NOTE — ED Provider Notes (Signed)
? ?[facility] ?Provider Note ? ? ? Event Date/Time  ? First MD Initiated Contact with Patient 08/01/21 1009   ?  (approximate) ? ? ?History  ? ?Fever, Cough, and Shortness of Breath ? ? ?HPI ? ?Patrick Montoya is a 81 y.o. male who presents to the ED for evaluation of Fever, Cough, and Shortness of Breath ?  ?I reviewed DC summary from 3/15 where patient was admitted medically for COPD exacerbation.  ?History of paroxysmal A-fib on Eliquis, Parkinson's disease. ?Also reviewed cardiology visit from earlier this morning were he saw Dr. Quentin Ore routinely. Temp 102.25F , sats 90% at the clinic.  CBC, CMP and UA were performed this morning.  UA is noninfectious.  CMP unremarkable.  CBC with mild leukocytosis of 10.3. ? ?Patient presents to the ED at the direction of his cardiologist.  He reports intermittent fevers at home, as recently as yesterday.  Reports cough, increased sputum production and wheezing.  Reports shortness of breath on exertion.  Denies chest pain or syncopal episodes. ? ?Physical Exam  ? ?Triage Vital Signs: ?ED Triage Vitals [08/01/21 1005]  ?Enc Vitals Group  ?   BP   ?   Pulse   ?   Resp   ?   Temp   ?   Temp src   ?   SpO2   ?   Weight 177 lb (80.3 kg)  ?   Height 5' 10.5" (1.791 m)  ?   Head Circumference   ?   Peak Flow   ?   Pain Score 8  ?   Pain Loc   ?   Pain Edu?   ?   Excl. in [place]?   ? ? ?Most recent vital signs: ?Vitals:  ? 08/01/21 1430 08/01/21 1500  ?BP: 107/62 122/76  ?Pulse: 77 77  ?Resp: (!) 22 17  ?Temp:  98.2 ?F (36.8 ?C)  ?SpO2: 91% 92%  ? ? ?General: Awake, no distress.  Audible wheezing and dyspneic without distress. ?CV:  Good peripheral perfusion. RRR, sinus on the monitor ?Resp:  Tachypneic to the mid 20s.  Diffuse expiratory wheezes and decreased airflow throughout. ?Abd:  No distention.  Soft and benign ?MSK:  No deformity noted.  Trace pitting edema noted bilaterally ?Neuro:  No focal deficits appreciated. ?Other:   ? ? ?ED Results / Procedures /  Treatments  ? ?Labs ?(all labs ordered are listed, but only abnormal results are displayed) ?Labs Reviewed  ?TROPONIN I (HIGH SENSITIVITY) - Abnormal; Notable for the following components:  ?    Result Value  ? Troponin I (High Sensitivity) 18 (*)   ? All other components within normal limits  ?RESP PANEL BY RT-PCR (FLU A&B, COVID) [id number]  ?CULTURE, BLOOD (ROUTINE X 2)  ?CULTURE, BLOOD (ROUTINE X 2)  ?EXPECTORATED SPUTUM ASSESSMENT W GRAM STAIN, RFLX TO RESP C  ?LACTIC ACID, PLASMA  ?LACTIC ACID, PLASMA  ?PROCALCITONIN  ?BRAIN NATRIURETIC PEPTIDE  ?STREP PNEUMONIAE URINARY ANTIGEN  ?LEGIONELLA PNEUMOPHILA SEROGP 1 UR AG  ?TROPONIN I (HIGH SENSITIVITY)  ? ? ?EKG ?Sinus rhythm, rate of 80 bpm.  Normal axis and intervals.  Nonspecific ST changes laterally without STEMI. ? ?RADIOLOGY ?2 view CXR reviewed by me R>L bibasilar opacities. ?CTA chest reviewed by me with evidence of multifocal pneumonia without PE ? ?Official radiology report(s): ?DG Chest 2 View ? ?Result Date: 08/01/2021 ?CLINICAL DATA:  COPD with fever EXAM: CHEST - 2 VIEW COMPARISON:  07/22/2021 FINDINGS: COPD with pulmonary hyperinflation. Heart  size and vascularity normal. Atherosclerotic calcification aortic arch Bibasilar airspace disease. Mild progression of right lower lobe airspace disease. Mild improvement in left lower lobe airspace disease. No significant effusion. IMPRESSION: COPD. Mild progression of right lower lobe airspace disease and mild improvement in left lower lobe airspace disease. Possible pneumonia. Electronically Signed   By: Franchot Gallo M.D.   On: 08/01/2021 10:43  ? ?CT Angio Chest PE W and/or Wo Contrast ? ?Result Date: 08/01/2021 ?CLINICAL DATA:  Fever, chest pain, shortness of breath EXAM: CT ANGIOGRAPHY CHEST WITH CONTRAST TECHNIQUE: Multidetector CT imaging of the chest was performed using the standard protocol during bolus administration of intravenous contrast. Multiplanar CT image reconstructions and MIPs were obtained  to evaluate the vascular anatomy. RADIATION DOSE REDUCTION: This exam was performed according to the departmental dose-optimization program which includes automated exposure control, adjustment of the mA and/or kV according to patient size and/or use of iterative reconstruction technique. CONTRAST:  58m OMNIPAQUE IOHEXOL 350 MG/ML SOLN COMPARISON:  02/26/2021 FINDINGS: Cardiovascular: There is homogeneous enhancement in thoracic aorta. There is ectasia of main pulmonary artery measuring 3.7 cm suggesting pulmonary arterial hypertension. There are no intraluminal filling defects in the pulmonary artery branches. Evaluation of small peripheral branches in the lower lung fields is limited by patchy infiltrates. Coronary artery calcifications are seen. Mediastinum/Nodes: No new significant lymphadenopathy is seen. Lungs/Pleura: Centrilobular and panlobular emphysema is seen. There are patchy infiltrates in the lower lung fields, more so on the right side suggesting atelectasis/pneumonia. Small patchy infiltrates are seen in the right upper lobe and left upper lobe. There are few scattered blebs and bullae. In the image 90 of series 6, there is a 5 mm nodule in the right mid lung fields which has not changed significantly in comparison with previous studies dating as far back as 04/18/2017. Small right pleural effusion is seen. There is possible minimal left pleural effusion. There is no pneumothorax. Upper Abdomen: Surgical clips are seen in gallbladder fossa. Small hiatal hernia is seen. Musculoskeletal: Unremarkable. Review of the MIP images confirms the above findings. IMPRESSION: There is no evidence of pulmonary artery embolism. There is no evidence of thoracic aortic dissection. There are patchy infiltrates in both lungs, more so in the right lower lobe suggesting multifocal pneumonia. Small bilateral pleural effusions, more so on the right side. COPD. Coronary artery disease. There is ectasia of main pulmonary  artery suggesting pulmonary arterial hypertension. Other findings as described in the body of the report. Electronically Signed   By: PElmer PickerM.D.   On: 08/01/2021 13:23   ? ?PROCEDURES and INTERVENTIONS: ? ?.1-3 Lead EKG Interpretation ?Performed by: SVladimir Crofts MD ?Authorized by: SVladimir Crofts MD  ? ?  Interpretation: normal   ?  ECG rate:  80 ?  ECG rate assessment: normal   ?  Rhythm: sinus rhythm   ?  Ectopy: none   ?  Conduction: normal   ?.Critical Care ?Performed by: SVladimir Crofts MD ?Authorized by: SVladimir Crofts MD  ? ?Critical care provider statement:  ?  Critical care time (minutes):  30 ?  Critical care time was exclusive of:  Separately billable procedures and treating other patients ?  Critical care was necessary to treat or prevent imminent or life-threatening deterioration of the following conditions:  Respiratory failure ?  Critical care was time spent personally by me on the following activities:  Development of treatment plan with patient or surrogate, discussions with consultants, evaluation of patient's response to treatment, examination of patient,  ordering and review of laboratory studies, ordering and review of radiographic studies, ordering and performing treatments and interventions, pulse oximetry, re-evaluation of patient's condition and review of old charts ? ?Medications  ?ipratropium-albuterol (DUONEB) 0.5-2.5 (3) MG/3ML nebulizer solution 3 mL (has no administration in time range)  ?albumin human 25 % solution 12.5 g (has no administration in time range)  ?dextromethorphan-guaiFENesin (MUCINEX DM) 30-600 MG per 12 hr tablet 1 tablet (has no administration in time range)  ?ondansetron (ZOFRAN) injection 4 mg (has no administration in time range)  ?acetaminophen (TYLENOL) tablet 650 mg (has no administration in time range)  ?hydrALAZINE (APRESOLINE) injection 5 mg (has no administration in time range)  ?vancomycin (VANCOREADY) IVPB 1750 mg/350 mL (has no administration in  time range)  ?ceFEPIme (MAXIPIME) 2 g in sodium chloride 0.9 % 100 mL IVPB (has no administration in time range)  ?doxazosin (CARDURA) tablet 4 mg (has no administration in time range)  ?metoprolol tartrate

## 2021-08-01 NOTE — Consult Note (Signed)
ANTICOAGULATION CONSULT NOTE - Follow Up Consult ? ?Pharmacy Consult for heparin gtt (PTA DOAC) ?Indication: atrial fibrillation ? ?No Known Allergies ? ?Patient Measurements: ?Height: 5' 10.5" (179.1 cm) ?Weight: 80.3 kg (177 lb) ?IBW/kg (Calculated) : 74.15 ?Heparin Dosing Weight: 80.3kg ? ?Vital Signs: ?Temp: 98.2 ?F (36.8 ?C) (03/22 1500) ?Temp Source: Oral (03/22 1500) ?BP: 122/76 (03/22 1500) ?Pulse Rate: 77 (03/22 1500) ? ?Labs: ?Recent Labs  ?  08/01/21 ?1045 08/01/21 ?1237  ?TROPONINIHS 18* 16  ? ? ?Estimated Creatinine Clearance: 76 mL/min (by C-G formula based on SCr of 0.75 mg/dL). ? ? ?Medications: ?NKDA.  ?Heparin Dosing Weight: 80.3kg ?PTA: Eliquis was just switched to Xarelto at cardiology office, but pt reportedly took both doses morning PTA. Last doses were AM of 08/01/2021. ?Inpatient: DOAC > Hep gtt. ? ?Assessment: ?81yo M w/ h/o HTN, COPD (on 3L BL O2), GERD, MDD/anxiety, BPH, Parkinson's disease, HBV, gastric ulcer disease, PAF on Eliquis (just switched to Xarelto), who presents with fever, shortness breath. ? ?Date Time aPTT/HL Rate/Comment ?3/23 0542 38s / >1.10 Baseline labs show no correlation; titrating by aPTT for now ?   ? ?Baseline Labs: ?aPTT - 38s ?INR - 1.2 ?Hgb - 11.8 ?Plts - 120 ? ?Goal of Therapy:  ?Heparin level 0.3-0.7 units/ml ?aPTT 66-102 seconds ?Monitor platelets by anticoagulation protocol: Yes ?  ?Plan:  ?NOTE: Eliquis was just switched to Xarelto at cardiology office, but pt reportedly took both doses morning of arrival, so last doses were AM of 08/01/2021. Based on d/w [facility] and Cardiology, will start heparin at next dosing interval AM of 08/02/21. ?Give 4000 units bolus x1; then start heparin infusion at 1250 units/hr ?Check aPTT/Anti-Xa level in 8 hours and daily once consecutively therapeutic.  ?Titrate by aPTT's until lab correlation is noted, then titrate by anti-xa alone. ?Continue to monitor H&H and platelets daily while on heparin gtt. ? ?Lorna Dibble, PharmD,  BCCP ?Clinical Pharmacist ?08/02/2021 7:41 AM ? ? ? ?

## 2021-08-01 NOTE — Consult Note (Addendum)
Pharmacy Antibiotic Note ? ?Patrick Montoya is a 81 y.o. male admitted on 08/01/2021 with pneumonia.  Pharmacy has been consulted for Vancomycin and Cefepime dosing. ? ?Plan: ?Vancomycin 1750 mg x 1 as loading dose ?Vancomycin 2000 mg IV Q 24 hrs. Goal AUC 400-550. ?Expected AUC: 512.9 ?Expected Css: 9.8 ?SCr used: 0.8(actual 0.75) ? ?Cefepime 2g Q8 hours ? ? ?Height: 5' 10.5" (179.1 cm) ?Weight: 80.3 kg (177 lb) ?IBW/kg (Calculated) : 74.15 ? ?Temp (24hrs), Avg:100.8 ?F (38.2 ?C), Min:99.4 ?F (37.4 ?C), Max:102.2 ?F (39 ?C) ? ?Recent Labs  ?Lab 08/01/21 ?1045 08/01/21 ?1237  ?LATICACIDVEN 0.9 0.8  ?  ?Estimated Creatinine Clearance: 76 mL/min (by C-G formula based on SCr of 0.75 mg/dL).   ? ?No Known Allergies ? ?Antimicrobials this admission: ?Vancomycin 3/22 >>  ?CFP 3/22 >>  ?Azithro/CRO 3/22 x 1 ? ?Dose adjustments this admission: ?N/A ? ?Microbiology results: ?3/22 Sputum cx- rare GPC in pairs  & rare GN coccobacilli  ?3/22 Bcx - NGTD ?3/22 AFB pending ?3/22 Flu/Covid/RVP- negative ?3/23 MRSA pcr - negative ? ? ?Thank you for allowing pharmacy to be a part of this patient?s care. ? ?Pearla Dubonnet ?08/01/2021 3:12 PM ? ?

## 2021-08-01 NOTE — ED Triage Notes (Signed)
Pt comes into the ED via POV sent by Cardiology c/o increased SHOB, fever, and cough.  Pt had labs done this morning.  Per cardiology he has had a fever ongoing since his discharge 7-10 days.  Per the family, the patient also gets confused at times.  Pt currently in NAD with even and unlabored respirations.   ?

## 2021-08-01 NOTE — Progress Notes (Signed)
Electrophysiology Office Note:    Date:  08/01/2021   ID:  Patrick Montoya, DOB December 22, 1940, MRN [id number]  PCP:  Barbette Reichmann, MD  [facility] Cardiologist:  None  [facility] Electrophysiologist:  Lanier Prude, MD   Referring MD: Armando Reichert, MD   Chief Complaint: Atrial fibrillation  History of Present Illness:    Patrick Montoya is a 81 y.o. male who presents for an evaluation of atrial fibrillation at the request of Dr. Beatrix Fetters. Their medical history includes COPD, gastric ulcer, hypertension, Parkinson's.  The patient was last seen by Dr. Beatrix Fetters July 11, 2021.  His diagnosis of atrial fibrillation dates back to October 2022.  He presented to the hospital with chest discomfort and was noted to be in atrial fibrillation with rapid ventricular rates.  He ultimately converted back to normal rhythm with diltiazem IV.  He was started on Eliquis twice daily.  He has been in the ER at least 3 additional times with paroxysmal atrial fibrillation treated with AV nodal blocking agents.  Antiarrhythmic drugs were discussed with the patient during his visit on March 1 but he elected to be referred to EP to discuss possible catheter ablation.   Today he is with a family member.  He tells me he is doing very poorly.  According to the family he has not been thinking straight.  He has had some hallucinations.  He is running a high fever of greater than 103 Fahrenheit.  This has been a recurrent issue for him.  He coughs.  His weight has been stable.  He has rigors.    Past Medical History:  Diagnosis Date   Basal cell carcinoma 07/25/2015   L zygoma inf to lat canthus   Basal cell carcinoma 06/11/2018   L nasal ala   BPH (benign prostatic hyperplasia)    COPD (chronic obstructive pulmonary disease) (HCC)    Elevated liver enzymes    Emphysema of lung (HCC)    Gastric ulcer    GERD (gastroesophageal reflux disease)    Hepatitis B    Hypertension    Parkinson's disease (HCC)     Prostate hypertrophy    Rotator cuff tear right   UTI (urinary tract infection)     Past Surgical History:  Procedure Laterality Date   CHOLECYSTECTOMY     COLONOSCOPY WITH PROPOFOL N/A 10/20/2015   Procedure: COLONOSCOPY WITH PROPOFOL;  Surgeon: Scot Jun, MD;  Location: [facility] ENDOSCOPY;  Service: Endoscopy;  Laterality: N/A;   COLONOSCOPY WITH PROPOFOL N/A 01/24/2021   Procedure: COLONOSCOPY WITH PROPOFOL;  Surgeon: Toledo, Boykin Nearing, MD;  Location: [facility] ENDOSCOPY;  Service: Gastroenterology;  Laterality: N/A;   ESOPHAGOGASTRODUODENOSCOPY (EGD) WITH PROPOFOL N/A 10/20/2015   Procedure: ESOPHAGOGASTRODUODENOSCOPY (EGD) WITH PROPOFOL;  Surgeon: Scot Jun, MD;  Location: [facility] ENDOSCOPY;  Service: Endoscopy;  Laterality: N/A;   GREEN LIGHT LASER TURP (TRANSURETHRAL RESECTION OF PROSTATE N/A 11/17/2018   Procedure: GREEN LIGHT LASER TURP (TRANSURETHRAL RESECTION OF PROSTATE;  Surgeon: Orson Ape, MD;  Location: [facility] ORS;  Service: Urology;  Laterality: N/A;   INGUINAL HERNIA REPAIR Right 02/18/2019   Procedure: HERNIA REPAIR INGUINAL ADULT;  Surgeon: Orson Ape, MD;  Location: [facility] ORS;  Service: Urology;  Laterality: Right;   INSERTION OF MESH Right 02/18/2019   Procedure: INSERTION OF MESH;  Surgeon: Orson Ape, MD;  Location: [facility] ORS;  Service: Urology;  Laterality: Right;   TONSILLECTOMY     TRANSURETHRAL MICROWAVE THERAPY  Current Medications: Current Meds  Medication Sig   albuterol (VENTOLIN HFA) 108 (90 Base) MCG/ACT inhaler Inhale 2 puffs into the lungs every 6 (six) hours as needed for wheezing or shortness of breath.   ALPRAZolam (XANAX) 0.5 MG tablet Take 0.5 mg by mouth at bedtime as needed for anxiety or sleep.   budesonide-formoterol (SYMBICORT) 160-4.5 MCG/ACT inhaler Inhale 2 puffs into the lungs 2 (two) times daily.   carbidopa-levodopa (SINEMET IR) 25-100 MG tablet Take 2 tablets by mouth 5 (five) times daily.   Cyanocobalamin ([employer] VITAMIN  B-12) 2000 MCG TBCR Take 2,000 mcg by mouth daily.   doxazosin (CARDURA) 4 MG tablet Take 4 mg by mouth daily.    ferrous gluconate (FERGON) 324 MG tablet Take 324 mg by mouth daily with breakfast.   metoprolol tartrate (LOPRESSOR) 25 MG tablet Take 1 tablet (25 mg total) by mouth 2 (two) times daily.   omeprazole (PRILOSEC) 40 MG capsule Take 40 mg by mouth daily.    QUEtiapine (SEROQUEL) 50 MG tablet Take 50 mg by mouth at bedtime.   rizatriptan (MAXALT) 10 MG tablet Take 10 mg by mouth as needed for migraine. May repeat in 2 hours if needed   tiotropium (SPIRIVA) 18 MCG inhalation capsule Place 1 capsule (18 mcg total) into inhaler and inhale daily.     Allergies:   Patient has no known allergies.   Social History   Socioeconomic History   Marital status: Married    Spouse name: Not on file   Number of children: Not on file   Years of education: Not on file   Highest education level: Not on file  Occupational History   Not on file  Tobacco Use   Smoking status: Former    Packs/day: 1.00    Years: 62.00    Pack years: 62.00    Types: Cigarettes   Smokeless tobacco: Never  Vaping Use   Vaping Use: Never used  Substance and Sexual Activity   Alcohol use: Yes    Comment: occasional   Drug use: No   Sexual activity: Not on file  Other Topics Concern   Not on file  Social History Narrative   Not on file   Social Determinants of Health   Financial Resource Strain: Not on file  Food Insecurity: Not on file  Transportation Needs: Not on file  Physical Activity: Not on file  Stress: Not on file  Social Connections: Not on file     Family History: The patient's family history includes Colon cancer in his mother; Hepatitis in his son. There is no history of Prostate cancer or Kidney cancer.  ROS:   Please see the history of present illness.    All other systems reviewed and are negative.  EKGs/Labs/Other Studies Reviewed:    The following studies were reviewed  today:  March 10, 2021 echo Left ventricular function normal, 55% Right ventricular function normal Mild MR   July 23, 2021 EKG shows sinus rhythm.  QTc is 450 ms.    Recent Labs: 03/09/2021: TSH 1.859 06/29/2021: B Natriuretic Peptide 45.9; Magnesium 1.8 07/22/2021: ALT 10 07/24/2021: BUN 14; Creatinine, Ser 0.75; Hemoglobin 13.6; Platelets 128; Potassium 4.3; Sodium 140  Recent Lipid Panel No results found for: CHOL, TRIG, HDL, CHOLHDL, VLDL, LDLCALC, LDLDIRECT  Physical Exam:    VS:  BP 130/64 (BP Location: Right Arm, Patient Position: Sitting, Cuff Size: Normal)   Pulse 78   Temp (!) 102.2 F (39 C)   Ht 5' 10.5" (  1.791 m)   Wt 177 lb (80.3 kg)   SpO2 90%   BMI 25.04 kg/m     Wt Readings from Last 3 Encounters:  08/01/21 177 lb (80.3 kg)  07/22/21 170 lb (77.1 kg)  06/29/21 170 lb (77.1 kg)     GEN: Ill-appearing, rigors in mild distress.  On supplemental oxygen.   HEENT: Normal NECK: No JVD; No carotid bruits LYMPHATICS: No lymphadenopathy CARDIAC: no clear murmurs, rubs, gallops RESPIRATORY: Mild increased work of breathing.  Lung fields show poor air movement.  Some scattered rhonchi and wheezing throughout both lung fields.    ABDOMEN: Soft, non-tender, non-distended MUSCULOSKELETAL:  No edema; No deformity  SKIN: Warm and dry NEUROLOGIC:  Alert and awake PSYCHIATRIC:  Normal affect       ASSESSMENT:    1. Fever, unspecified fever cause   2. Paroxysmal atrial fibrillation (HCC)   3. Essential hypertension   4. History of gastric ulcer    PLAN:    In order of problems listed above:  I am concerned about Mr. Karen Chafe.  He needs to present to the emergency department for recurrent fever.  I reviewed his medical history in detail.  I am quite concerned that his recurrent presentations with fever and pneumonia are secondary to an obstructive process in the lungs.  Given his 60-pack-year smoking history, lung cancer is high on the differential.  He  needs to be admitted.  Blood cultures need to be drawn and antibiotics administered.  He also needs a pulmonary consult for possible bronchoscopy to evaluate for any obstructive masses in the airways.  He also likely needs repeat cross-sectional imaging of the chest to look for any masses.  Please get blood cultures prior to administering antibiotics.  He also should have an echocardiogram to evaluate the valves in the setting of recurrent fevers although I do not think endocarditis is high on the differential.  His atrial fibrillation is secondary to his recurrent fevers.  I would hold his Eliquis right now in case invasive procedures are required.  He can be started on heparin while inpatient.    Medication Adjustments/Labs and Tests Ordered: Current medicines are reviewed at length with the patient today.  Concerns regarding medicines are outlined above.  No orders of the defined types were placed in this encounter.  No orders of the defined types were placed in this encounter.    Signed, Rossie Muskrat. Lalla Brothers, MD, [facility], [facility] 08/01/2021 9:53 AM    Electrophysiology [facility]

## 2021-08-02 DIAGNOSIS — J189 Pneumonia, unspecified organism: Secondary | ICD-10-CM | POA: Diagnosis not present

## 2021-08-02 LAB — RESPIRATORY PANEL BY PCR

## 2021-08-02 LAB — LEGIONELLA PNEUMOPHILA SEROGP 1 UR AG: L. pneumophila Serogp 1 Ur Ag: NEGATIVE

## 2021-08-02 LAB — PROTIME-INR
INR: 1.2 (ref 0.8–1.2)
INR: 1.2 (ref 0.8–1.2)
Prothrombin Time: 15.1 seconds (ref 11.4–15.2)
Prothrombin Time: 14.7 seconds (ref 11.4–15.2)

## 2021-08-02 LAB — CBC
HCT: 36.6 % — ABNORMAL LOW (ref 39.0–52.0)
Hemoglobin: 11.8 g/dL — ABNORMAL LOW (ref 13.0–17.0)
MCH: 32.5 pg (ref 26.0–34.0)
MCHC: 32.2 g/dL (ref 30.0–36.0)
MCV: 100.8 fL — ABNORMAL HIGH (ref 80.0–100.0)
Platelets: 120 10*3/uL — ABNORMAL LOW (ref 150–400)
RBC: 3.63 MIL/uL — ABNORMAL LOW (ref 4.22–5.81)
RDW: 13.5 % (ref 11.5–15.5)
WBC: 9.8 10*3/uL (ref 4.0–10.5)
nRBC: 0 % (ref 0.0–0.2)

## 2021-08-02 LAB — APTT
aPTT: 38 seconds — ABNORMAL HIGH (ref 24–36)
aPTT: 96 seconds — ABNORMAL HIGH (ref 24–36)

## 2021-08-02 LAB — EXPECTORATED SPUTUM ASSESSMENT W GRAM STAIN, RFLX TO RESP C

## 2021-08-02 LAB — BASIC METABOLIC PANEL
Anion gap: 6 (ref 5–15)
BUN: 17 mg/dL (ref 8–23)
CO2: 30 mmol/L (ref 22–32)
Calcium: 8.6 mg/dL — ABNORMAL LOW (ref 8.9–10.3)
Chloride: 108 mmol/L (ref 98–111)
Creatinine, Ser: 0.67 mg/dL (ref 0.61–1.24)
GFR, Estimated: >60 mL/min (ref >60)
Glucose, Bld: 160 mg/dL — ABNORMAL HIGH (ref 70–99)
Potassium: 4.4 mmol/L (ref 3.5–5.1)
Sodium: 144 mmol/L (ref 135–145)

## 2021-08-02 LAB — SURGICAL PCR SCREEN
MRSA, PCR: NEGATIVE
Staphylococcus aureus: NEGATIVE

## 2021-08-02 LAB — HEPARIN LEVEL (UNFRACTIONATED): Heparin Unfractionated: >1.1 IU/mL — ABNORMAL HIGH (ref 0.30–0.70)

## 2021-08-02 LAB — FERRITIN: Ferritin: 447 ng/mL — ABNORMAL HIGH (ref 24–336)

## 2021-08-02 MED ORDER — HEPARIN (PORCINE) 25000 UT/250ML-% IV SOLN
1200.0000 [IU]/h | INTRAVENOUS | Status: DC
  Administered 2021-08-02 – 2021-08-03 (×3): 1250 [IU]/h via INTRAVENOUS
  Filled 2021-08-02 (×3): qty 250

## 2021-08-02 MED ORDER — IPRATROPIUM-ALBUTEROL 0.5-2.5 (3) MG/3ML IN SOLN
3.0000 mL | RESPIRATORY_TRACT | Status: DC
  Administered 2021-08-02 – 2021-08-03 (×5): 3 mL via RESPIRATORY_TRACT
  Filled 2021-08-02 (×5): qty 3

## 2021-08-02 MED ORDER — HEPARIN BOLUS VIA INFUSION
4000.0000 [IU] | Freq: Once | INTRAVENOUS | Status: AC
  Administered 2021-08-02: 4000 [IU] via INTRAVENOUS
  Filled 2021-08-02: qty 4000

## 2021-08-02 NOTE — Progress Notes (Signed)
?PROGRESS NOTE ? ? ? ?Patrick Montoya  WNI:[id number] DOB: 20-Mar-1941 DOA: 08/01/2021 ?PCP: Tracie Harrier, MD  ? ? ?Brief Narrative:  ?81 y.o. male with medical history significant of hypertension, COPD on 3 L oxygen at home, GERD, depression, anxiety, BPH, Parkinson's disease, HBV, gastric ulcer disease, PAF on Eliquis (just switched to Xarelto), who presents with fever, shortness breath. ?  ?Patient was recently hospitalized from 3/12 - 3/15 due to COPD exacerbation.  She has shortness breath and productive cough with yellow-colored sputum production for about 1 week, which has been progressively worsening.  Patient has mild left-sided pleuritic sharp chest pain.  Denies nausea, vomiting, diarrhea or abdominal pain.  No symptoms of UTI.  Patient was seen by cardiologist in the office today, and was sent to ED for further evaluation and treatment.  Of note, patient used to be on Eliquis for atrial fibrillation, which was switched to Xarelto ? ?Patient had fever on admission.  Oxygen saturation low 90s on 4 L.  CT angio negative for PE but with multifocal infiltration consistent with pneumonia. ? ?Assessment & Plan: ?  ?Principal Problem: ?  Multifocal pneumonia ?Active Problems: ?  COPD exacerbation ([place]) ?  Acute on chronic respiratory failure with hypoxia (HCC) ?  Paroxysmal atrial fibrillation (HCC) ?  Parkinson's disease ([place]) ?  HTN (hypertension) ?  Sepsis ([place]) ?  BPH (benign prostatic hyperplasia) ? ?  ?Acute on chronic respiratory failure with hypoxia and sepsis due to multifocal pneumonia and  COPD exacerbation ?CT angiogram is negative for PE, but showed multifocal infiltration, patient has wheezing, consistent with COPD exacerbation.   ?Patient meets criteria for sepsis with RR 30 and fever 102.2.   ?Currently on 4 L oxygen.  Lactic acid is normal.  ? -Patient has had recurrent fevers over the past 6 or 7 months without clear etiology identified.  Recurrent hospitalizations ?Plan: ?Continue  broad-spectrum antibiotics vancomycin and cefepime for now ?Check MRSA screen ?Check RVP ?Urine Legionella and streptococcal antigen ?Follow blood cultures, no growth to date ?Continue IV steroids for now ?Bronchodilators and mucolytic's ?We will request pulmonary consultation ?  ?Paroxysmal atrial fibrillation (HCC) ?Patient was recently switched to Xarelto from Eliquis ?He was inadvertently taking both ?Per cardiology note from 3/22 recommend admission and transition to IV heparin ?Plan: ?Continue IV heparin ?Telemetry monitoring ?  ?Parkinson's disease ([place]) ?-Sinemet ?-Metoprolol ?  ?HTN (hypertension) ?-IV hydralazine as needed ?-Metoprolol ?  ?BPH (benign prostatic hyperplasia) ?-Cardura ? ? ?DVT prophylaxis: IV heparin ?Code Status: Full ?Family Communication: None today.  Offered to call but patient declined ?Disposition Plan: Status is: Inpatient ?Remains inpatient appropriate because: Sepsis, acute on chronic respiratory failure, multifocal pneumonia ? ? ?Level of care: Telemetry Medical ? ?Consultants:  ?Pulmonology ? ?Procedures:  ?None ? ?Antimicrobials: ?Vancomycin ?Ceftriaxone ? ? ?Subjective: ?Seen and examined.  Resting comfortably in bed.  No visible distress.  Does endorse some cough and shortness of breath. ? ?Objective: ?Vitals:  ? 08/02/21 0347 08/02/21 0418 08/02/21 3818 08/02/21 0801  ?BP: (!) 143/83   118/71  ?Pulse: 77   74  ?Resp: 18   18  ?Temp: 97.7 ?F (36.5 ?C)   98.2 ?F (36.8 ?C)  ?TempSrc:      ?SpO2: 95% 96% 94% 96%  ?Weight:      ?Height:      ? ? ?Intake/Output Summary (Last 24 hours) at 08/02/2021 1045 ?Last data filed at 08/01/2021 2220 ?Gross per 24 hour  ?Intake 1004.69 ml  ?Output 700 ml  ?Net  304.69 ml  ? ?Filed Weights  ? 08/01/21 1005  ?Weight: 80.3 kg  ? ? ?Examination: ? ?General exam: Appears calm and comfortable  ?Respiratory system: Bibasilar crackles.  Normal work of breathing.  4 L ?Cardiovascular system: S1-S2, regular rate, irregular rhythm, no murmurs, no pedal  edema ?Gastrointestinal system: Soft, NT/ND, normal bowel sounds ?Central nervous system: Alert and oriented. No focal neurological deficits. ?Extremities: Symmetric 5 x 5 power. ?Skin: No rashes, lesions or ulcers ?Psychiatry: Judgement and insight appear normal. Mood & affect appropriate.  ? ? ? ?Data Reviewed: I have personally reviewed following labs and imaging studies ? ?CBC: ?Recent Labs  ?Lab 08/02/21 ?0542  ?WBC 9.8  ?HGB 11.8*  ?HCT 36.6*  ?MCV 100.8*  ?PLT 120*  ? ?Basic Metabolic Panel: ?Recent Labs  ?Lab 08/02/21 ?0542  ?NA 144  ?K 4.4  ?CL 108  ?CO2 30  ?GLUCOSE 160*  ?BUN 17  ?CREATININE 0.67  ?CALCIUM 8.6*  ? ?GFR: ?Estimated Creatinine Clearance: 76 mL/min (by C-G formula based on SCr of 0.67 mg/dL). ?Liver Function Tests: ?No results for input(s): AST, ALT, ALKPHOS, BILITOT, PROT, ALBUMIN in the last 168 hours. ?No results for input(s): LIPASE, AMYLASE in the last 168 hours. ?No results for input(s): AMMONIA in the last 168 hours. ?Coagulation Profile: ?Recent Labs  ?Lab 08/02/21 ?0542  ?INR 1.2  ? ?Cardiac Enzymes: ?No results for input(s): CKTOTAL, CKMB, CKMBINDEX, TROPONINI in the last 168 hours. ?BNP (last 3 results) ?No results for input(s): PROBNP in the last 8760 hours. ?HbA1C: ?No results for input(s): HGBA1C in the last 72 hours. ?CBG: ?No results for input(s): GLUCAP in the last 168 hours. ?Lipid Profile: ?No results for input(s): CHOL, HDL, LDLCALC, TRIG, CHOLHDL, LDLDIRECT in the last 72 hours. ?Thyroid Function Tests: ?No results for input(s): TSH, T4TOTAL, FREET4, T3FREE, THYROIDAB in the last 72 hours. ?Anemia Panel: ?No results for input(s): VITAMINB12, FOLATE, FERRITIN, TIBC, IRON, RETICCTPCT in the last 72 hours. ?Sepsis Labs: ?Recent Labs  ?Lab 08/01/21 ?1045 08/01/21 ?1237  ?PROCALCITON <0.10  --   ?LATICACIDVEN 0.9 0.8  ? ? ?Recent Results (from the past 240 hour(s))  ?Expectorated Sputum Assessment w Gram Stain, Rflx to Resp Cult     Status: None  ? Collection Time: 08/01/21   3:45 AM  ? Specimen: Expectorated Sputum  ?Result Value Ref Range Status  ? Specimen Description EXPECTORATED SPUTUM  Final  ? Special Requests NONE  Final  ? Sputum evaluation   Final  ?  THIS SPECIMEN IS ACCEPTABLE FOR SPUTUM CULTURE ?Performed at [facility], [address] [place], [place] [address] ?  ? Report Status 08/02/2021 FINAL  Final  ?Culture, Respiratory w Gram Stain     Status: None (Preliminary result)  ? Collection Time: 08/01/21  3:45 AM  ?Result Value Ref Range Status  ? Specimen Description   Final  ?  EXPECTORATED SPUTUM ?Performed at [facility], [address] [place], [place] [address] ?  ? Special Requests   Final  ?  NONE Reflexed from [id number] ?Performed at [facility], [address], [place], [place] [address] ?  ? Gram Stain   Final  ?  RARE WBC PRESENT, PREDOMINANTLY MONONUCLEAR ?FEW SQUAMOUS EPITHELIAL CELLS PRESENT ?RARE GRAM POSITIVE COCCI IN PAIRS ?RARE GRAM NEGATIVE COCCOBACILLI ?Performed at [facility], [place] [address] [place], [place] [address] ?  ? Culture PENDING  Incomplete  ? Report Status PENDING  Incomplete  ?Blood culture (routine x 2)  Status: None (Preliminary result)  ? Collection Time: 08/01/21 10:45 AM  ? Specimen: BLOOD  ?Result Value Ref Range Status  ? Specimen Description BLOOD RIGHT AC  Final  ? Special Requests   Final  ?  BOTTLES DRAWN AEROBIC AND ANAEROBIC Blood Culture adequate volume  ? Culture   Final  ?  NO GROWTH < 24 HOURS ?Performed at [facility], [address] [place], [place] [address] ?  ? Report Status PENDING  Incomplete  ?Resp Panel by RT-PCR (Flu A&B, Covid) Nasopharyngeal Swab     Status: None  ? Collection Time: 08/01/21 10:45 AM  ? Specimen: Nasopharyngeal Swab; Nasopharyngeal(NP) swabs in vial transport medium  ?Result Value Ref Range Status  ? SARS Coronavirus 2 by RT PCR NEGATIVE NEGATIVE Final  ?  Comment: (NOTE) ?SARS-CoV-2 target nucleic acids are NOT DETECTED. ? ?The SARS-CoV-2  RNA is generally detectable in upper respiratory ?specimens during the acute phase of infection. The lowest ?concentration of SARS-CoV-2 viral copies this assay can detect is ?138 copies/mL. A negative result does

## 2021-08-02 NOTE — Consult Note (Signed)
? ? ? ?PULMONOLOGY ? ? ? ? ? ? ? ? ?Date: 08/02/2021,   ?MRN# [id number] Patrick Montoya 02/19/1941 ? ? ?  ?AdmissionWeight: 80.3 kg                 ?CurrentWeight: 80.3 kg ? ?Referring physician: Dr. Priscella Mann ? ? ?CHIEF COMPLAINT:  ? ?Chronic recurrent febrile illness with abnormal chest ? ? ?HISTORY OF PRESENT ILLNESS  ? ?81 year old male with history of basal cell carcinoma, BPH, advanced COPD with bullous centrilobular emphysema as well as bronchitis chronically and hypoxemia on 3 L/min nasal cannula at home.  History of gastric ulcer disease, Parkinson's, UTIs essential hypertension hepatitis B, came in with complaints of worsening shortness of breath as well as pleuritic chest pain coupled with volume in his phlegm which was discolored compared to baseline.  He denied flulike illness however did endorse fevers and chills chronically.  Lab work showed negative COVID-19 mild leukocytosis cardiac biomarkers are normal was found to be febrile on admission temperature of 102 had an EKG done which did not show QTc prolongation or ST elevation MI.  Pulmonary consultation placed for additional evaluation management of febrile illness with abnormal CT chest ? ? ?PAST MEDICAL HISTORY  ? ?Past Medical History:  ?Diagnosis Date  ? Basal cell carcinoma 07/25/2015  ? L zygoma inf to lat canthus  ? Basal cell carcinoma 06/11/2018  ? L nasal ala  ? BPH (benign prostatic hyperplasia)   ? COPD (chronic obstructive pulmonary disease) ([place])   ? Elevated liver enzymes   ? Emphysema of lung ([place])   ? Gastric ulcer   ? GERD (gastroesophageal reflux disease)   ? Hepatitis B   ? Hypertension   ? Parkinson's disease ([place])   ? Prostate hypertrophy   ? Rotator cuff tear right  ? UTI (urinary tract infection)   ? ? ? ?SURGICAL HISTORY  ? ?Past Surgical History:  ?Procedure Laterality Date  ? CHOLECYSTECTOMY    ? COLONOSCOPY WITH PROPOFOL N/A 10/20/2015  ? Procedure: COLONOSCOPY WITH PROPOFOL;  Surgeon: Manya Silvas, MD;  Location[facility]  ENDOSCOPY;  Service: Endoscopy;  Laterality: N/A;  ? COLONOSCOPY WITH PROPOFOL N/A 01/24/2021  ? Procedure: COLONOSCOPY WITH PROPOFOL;  Surgeon: Toledo, Benay Pike, MD;  Location: [facility] ENDOSCOPY;  Service: Gastroenterology;  Laterality: N/A;  ? ESOPHAGOGASTRODUODENOSCOPY (EGD) WITH PROPOFOL N/A 10/20/2015  ? Procedure: ESOPHAGOGASTRODUODENOSCOPY (EGD) WITH PROPOFOL;  Surgeon: Manya Silvas, MD;  Location: [facility] ENDOSCOPY;  Service: Endoscopy;  Laterality: N/A;  ? GREEN LIGHT LASER TURP (TRANSURETHRAL RESECTION OF PROSTATE N/A 11/17/2018  ? Procedure: GREEN LIGHT LASER TURP (TRANSURETHRAL RESECTION OF PROSTATE;  Surgeon: Royston Cowper, MD;  Location: [facility] ORS;  Service: Urology;  Laterality: N/A;  ? INGUINAL HERNIA REPAIR Right 02/18/2019  ? Procedure: HERNIA REPAIR INGUINAL ADULT;  Surgeon: Royston Cowper, MD;  Location: [facility] ORS;  Service: Urology;  Laterality: Right;  ? INSERTION OF MESH Right 02/18/2019  ? Procedure: INSERTION OF MESH;  Surgeon: Royston Cowper, MD;  Location: [facility] ORS;  Service: Urology;  Laterality: Right;  ? TONSILLECTOMY    ? TRANSURETHRAL MICROWAVE THERAPY    ? ? ? ?FAMILY HISTORY  ? ?Family History  ?Problem Relation Age of Onset  ? Colon cancer Mother   ? Hepatitis Son   ? Prostate cancer Neg Hx   ? Kidney cancer Neg Hx   ? ? ? ?SOCIAL HISTORY  ? ?Social History  ? ?Tobacco Use  ? Smoking status: Former  ?  Packs/day: 1.00  ?  Years: 62.00  ?  Pack years: 62.00  ?  Types: Cigarettes  ? Smokeless tobacco: Never  ?Vaping Use  ? Vaping Use: Never used  ?Substance Use Topics  ? Alcohol use: Yes  ?  Comment: occasional  ? Drug use: No  ? ? ? ?MEDICATIONS  ? ? ?Home Medication:  ?  ?Current Medication: ? ?Current Facility-Administered Medications:  ?  acetaminophen (TYLENOL) tablet 650 mg, 650 mg, Oral, Q6H PRN, Ivor Costa, MD ?  ALPRAZolam Duanne Moron) tablet 0.5 mg, 0.5 mg, Oral, QHS PRN, Ivor Costa, MD, 0.5 mg at 08/01/21 2005 ?  carbidopa-levodopa (SINEMET IR) 25-100 MG per tablet immediate  release 2 tablet, 2 tablet, Oral, 5 X Daily, Ivor Costa, MD, 2 tablet at 08/02/21 [phone] ?  ceFEPIme (MAXIPIME) 2 g in sodium chloride 0.9 % 100 mL IVPB, 2 g, Intravenous, Q8H, Nazari, Walid A, RPH, Last Rate: 200 mL/hr at 08/02/21 0508, 2 g at 08/02/21 7517 ?  dextromethorphan-guaiFENesin (MUCINEX DM) 30-600 MG per 12 hr tablet 1 tablet, 1 tablet, Oral, BID PRN, Ivor Costa, MD ?  doxazosin (CARDURA) tablet 4 mg, 4 mg, Oral, Daily, Ivor Costa, MD, 4 mg at 08/02/21 0017 ?  ferrous gluconate (FERGON) tablet 324 mg, 324 mg, Oral, Q breakfast, Ivor Costa, MD, 324 mg at 08/02/21 4944 ?  heparin ADULT infusion 100 units/mL (25000 units/268m), 1,250 Units/hr, Intravenous, Continuous, Beers, BShanon Brow RPH, Last Rate: 12.5 mL/hr at 08/02/21 0904, 1,250 Units/hr at 08/02/21 0904 ?  hydrALAZINE (APRESOLINE) injection 5 mg, 5 mg, Intravenous, Q2H PRN, NIvor Costa MD ?  ipratropium-albuterol (DUONEB) 0.5-2.5 (3) MG/3ML nebulizer solution 3 mL, 3 mL, Nebulization, Q4H, NIvor Costa MD, 3 mL at 08/02/21 09675?  methylPREDNISolone sodium succinate (SOLU-MEDROL) 40 mg/mL injection 40 mg, 40 mg, Intravenous, Q12H, NIvor Costa MD, 40 mg at 08/02/21 0850 ?  metoprolol tartrate (LOPRESSOR) tablet 25 mg, 25 mg, Oral, BID, NIvor Costa MD, 25 mg at 08/02/21 0850 ?  mometasone-formoterol (DULERA) 200-5 MCG/ACT inhaler 2 puff, 2 puff, Inhalation, BID, NIvor Costa MD, 2 puff at 08/02/21 0850 ?  ondansetron (ZOFRAN) injection 4 mg, 4 mg, Intravenous, Q8H PRN, NIvor Costa MD ?  pantoprazole (PROTONIX) EC tablet 40 mg, 40 mg, Oral, Daily, NIvor Costa MD, 40 mg at 08/02/21 0850 ?  QUEtiapine (SEROQUEL) tablet 50 mg, 50 mg, Oral, QHS, NIvor Costa MD, 50 mg at 08/01/21 2005 ?  vancomycin (VANCOREADY) IVPB 2000 mg/400 mL, 2,000 mg, Intravenous, Q24H, Nazari, Walid A, RPH ?  vitamin B-12 (CYANOCOBALAMIN) tablet 2,000 mcg, 2,000 mcg, Oral, Daily, NIvor Costa MD, 2,000 mcg at 08/02/21 09163? ? ? ?ALLERGIES  ? ?Patient has no known  allergies. ? ? ? ? ?REVIEW OF SYSTEMS  ? ? ?Review of Systems: ? ?Gen:  Denies  fever, sweats, chills weigh loss  ?HEENT: Denies blurred vision, double vision, ear pain, eye pain, hearing loss, nose bleeds, sore throat ?Cardiac:  No dizziness, chest pain or heaviness, chest tightness,edema ?Resp:   Denies cough or sputum porduction, shortness of breath,wheezing, hemoptysis,  ?Gi: Denies swallowing difficulty, stomach pain, nausea or vomiting, diarrhea, constipation, bowel incontinence ?Gu:  Denies bladder incontinence, burning urine ?Ext:   Denies Joint pain, stiffness or swelling ?Skin: Denies  skin rash, easy bruising or bleeding or hives ?Endoc:  Denies polyuria, polydipsia , polyphagia or weight change ?Psych:   Denies depression, insomnia or hallucinations  ? ?Other:  All other systems negative ? ? ?VS: BP 118/71 (BP Location: Left Arm)  Pulse 74   Temp 98.2 ?F (36.8 ?C)   Resp 18   Ht 5' 10.5" (1.791 m)   Wt 80.3 kg   SpO2 96%   BMI 25.04 kg/m?   ? ? ? ?PHYSICAL EXAM  ? ? ?GENERAL:NAD, no fevers, chills, no weakness no fatigue ?HEAD: Normocephalic, atraumatic.  ?EYES: Pupils equal, round, reactive to light. Extraocular muscles intact. No scleral icterus.  ?MOUTH: Moist mucosal membrane. Dentition intact. No abscess noted.  ?EAR, NOSE, THROAT: Clear without exudates. No external lesions.  ?NECK: Supple. No thyromegaly. No nodules. No JVD.  ?PULMONARY: decreased breath sounds with mild rhonchi worse at bases bilaterally.  ?CARDIOVASCULAR: S1 and S2. Regular rate and rhythm. No murmurs, rubs, or gallops. No edema. Pedal pulses 2+ bilaterally.  ?GASTROINTESTINAL: Soft, nontender, nondistended. No masses. Positive bowel sounds. No hepatosplenomegaly.  ?MUSCULOSKELETAL: No swelling, clubbing, or edema. Range of motion full in all extremities.  ?NEUROLOGIC: Cranial nerves II through XII are intact. No gross focal neurological deficits. Sensation intact. Reflexes intact.  ?SKIN: No ulceration, lesions, rashes,  or cyanosis. Skin warm and dry. Turgor intact.  ?PSYCHIATRIC: Mood, affect within normal limits. The patient is awake, alert and oriented x 3. Insight, judgment intact.  ? ? ?  ? ?IMAGING  ? ? ? ?CT chest PE protocol 08/02/2021-independ

## 2021-08-03 DIAGNOSIS — J189 Pneumonia, unspecified organism: Secondary | ICD-10-CM | POA: Diagnosis not present

## 2021-08-03 LAB — CBC
HCT: 36.4 % — ABNORMAL LOW (ref 39.0–52.0)
Hemoglobin: 11.7 g/dL — ABNORMAL LOW (ref 13.0–17.0)
MCH: 32.1 pg (ref 26.0–34.0)
MCHC: 32.1 g/dL (ref 30.0–36.0)
MCV: 100 fL (ref 80.0–100.0)
Platelets: 122 10*3/uL — ABNORMAL LOW (ref 150–400)
RBC: 3.64 MIL/uL — ABNORMAL LOW (ref 4.22–5.81)
RDW: 13.9 % (ref 11.5–15.5)
WBC: 10.6 10*3/uL — ABNORMAL HIGH (ref 4.0–10.5)
nRBC: 0 % (ref 0.0–0.2)

## 2021-08-03 LAB — HEPARIN LEVEL (UNFRACTIONATED): Heparin Unfractionated: >1.1 IU/mL — ABNORMAL HIGH (ref 0.30–0.70)

## 2021-08-03 LAB — PREALBUMIN: Prealbumin: 13.5 mg/dL — ABNORMAL LOW (ref 18–38)

## 2021-08-03 LAB — HEPATITIS PANEL, ACUTE
HCV Ab: REACTIVE — AB
Hep A IgM: NONREACTIVE
Hep B C IgM: NONREACTIVE
Hepatitis B Surface Ag: NONREACTIVE

## 2021-08-03 LAB — C-REACTIVE PROTEIN: CRP: 6.8 mg/dL — ABNORMAL HIGH (ref <1.0)

## 2021-08-03 LAB — PROCALCITONIN: Procalcitonin: <0.1 ng/mL

## 2021-08-03 LAB — APTT: aPTT: 98 seconds — ABNORMAL HIGH (ref 24–36)

## 2021-08-03 MED ORDER — DOCUSATE SODIUM 100 MG PO CAPS
100.0000 mg | ORAL_CAPSULE | Freq: Two times a day (BID) | ORAL | Status: DC | PRN
  Administered 2021-08-03 – 2021-08-04 (×2): 100 mg via ORAL
  Filled 2021-08-03 (×2): qty 1

## 2021-08-03 MED ORDER — IPRATROPIUM-ALBUTEROL 0.5-2.5 (3) MG/3ML IN SOLN
3.0000 mL | Freq: Four times a day (QID) | RESPIRATORY_TRACT | Status: DC
Start: 2021-08-04 — End: 2021-08-04
  Administered 2021-08-04 (×2): 3 mL via RESPIRATORY_TRACT
  Filled 2021-08-03 (×2): qty 3

## 2021-08-03 MED ORDER — ALPRAZOLAM 0.5 MG PO TABS
0.5000 mg | ORAL_TABLET | Freq: Three times a day (TID) | ORAL | Status: DC | PRN
  Administered 2021-08-03: 0.5 mg via ORAL
  Filled 2021-08-03: qty 1

## 2021-08-03 MED ORDER — METHYLPREDNISOLONE SODIUM SUCC 40 MG IJ SOLR
40.0000 mg | Freq: Every day | INTRAMUSCULAR | Status: DC
  Administered 2021-08-03: 40 mg via INTRAVENOUS
  Filled 2021-08-03: qty 1

## 2021-08-03 MED ORDER — SODIUM CHLORIDE 0.9 % IV SOLN
2.0000 g | INTRAVENOUS | Status: DC
  Administered 2021-08-03: 2 g via INTRAVENOUS
  Filled 2021-08-03: qty 2
  Filled 2021-08-03: qty 20

## 2021-08-03 MED ORDER — SODIUM CHLORIDE 0.9 % IV SOLN
500.0000 mg | INTRAVENOUS | Status: DC
  Administered 2021-08-03: 500 mg via INTRAVENOUS
  Filled 2021-08-03: qty 5
  Filled 2021-08-03: qty 500

## 2021-08-03 NOTE — Progress Notes (Signed)
?PROGRESS NOTE ? ? ? ?Patrick Montoya  BSJ:[id number] DOB: Feb 28, 1941 DOA: 08/01/2021 ?PCP: Tracie Harrier, MD  ? ? ?Brief Narrative:  ?81 y.o. male with medical history significant of hypertension, COPD on 3 L oxygen at home, GERD, depression, anxiety, BPH, Parkinson's disease, HBV, gastric ulcer disease, PAF on Eliquis (just switched to Xarelto), who presents with fever, shortness breath. ?  ?Patient was recently hospitalized from 3/12 - 3/15 due to COPD exacerbation.  She has shortness breath and productive cough with yellow-colored sputum production for about 1 week, which has been progressively worsening.  Patient has mild left-sided pleuritic sharp chest pain.  Denies nausea, vomiting, diarrhea or abdominal pain.  No symptoms of UTI.  Patient was seen by cardiologist in the office today, and was sent to ED for further evaluation and treatment.  Of note, patient used to be on Eliquis for atrial fibrillation, which was switched to Xarelto ? ?Patient had fever on admission.  Oxygen saturation low 90s on 4 L.  CT angio negative for PE but with multifocal infiltration consistent with pneumonia. ? ?Assessment & Plan: ?  ?Principal Problem: ?  Multifocal pneumonia ?Active Problems: ?  COPD exacerbation ([place]) ?  Acute on chronic respiratory failure with hypoxia (H[facility]) ?  Paroxysmal atrial fibrillation (H[facility]) ?  Parkinson's disease ([place]) ?  HTN (hypertension) ?  Sepsis ([place]) ?  BPH (benign prostatic hyperplasia) ? ?  ?Acute on chronic respiratory failure with hypoxia and sepsis due to multifocal pneumonia and  COPD exacerbation ?CT angiogram is negative for PE, but showed multifocal infiltration, patient has wheezing, consistent with COPD exacerbation.   ?Patient meets criteria for sepsis with RR 30 and fever 102.2.   ?Currently on 4 L oxygen.  Lactic acid is normal.  ? -Patient has had recurrent fevers over the past 6 or 7 months without clear etiology identified.  Recurrent hospitalizations ?MRSA screen negative ?RVP  negative ?Legionella and streptococcal antigen negative ?Blood cultures no growth to date ?Plan: ?Continue cefepime ?Continue broad-spectrum antibiotics vancomycin and cefepime for now ?Follow blood cultures, no growth to date ?Continue IV steroids for now, dose decreased to 40 mg daily ?Bronchodilators and mucolytic's ?Appreciate pulmonary recommendations ?  ?Paroxysmal atrial fibrillation ([facility]) ?Patient was recently switched to Xarelto from Eliquis ?He was inadvertently taking both ?Per cardiology note from 3/22 recommend admission and transition to IV heparin ?Plan: ?Continue IV heparin for now ?If no procedures planned can switch back to home DOAC ?Telemetry monitoring ?  ?Parkinson's disease ([place]) ?-Sinemet ?-Metoprolol ?  ?HTN (hypertension) ?-IV hydralazine as needed ?-Metoprolol ?  ?BPH (benign prostatic hyperplasia) ?-Cardura ? ? ?DVT prophylaxis: IV heparin ?Code Status: Full ?Family Communication: None today.  Offered to call but patient declined ?Disposition Plan: Status is: Inpatient ?Remains inpatient appropriate because: Sepsis, acute on chronic respiratory failure, multifocal pneumonia ? ? ?Level of care: Med-Surg ? ?Consultants:  ?Pulmonology ? ?Procedures:  ?None ? ?Antimicrobials: ?Ceftriaxone ? ? ?Subjective: ?Seen and examined.  Resting comfortably in bed.  No visible distress.  No fevers over interval.  Cough and shortness of breath improved. ? ?Objective: ?Vitals:  ? 08/02/21 2015 08/03/21 7654 08/03/21 0734 08/03/21 0837  ?BP: 131/73 (!) 147/90  136/79  ?Pulse: 87 71  70  ?Resp: '18 20  18  '$ ?Temp: 97.8 ?F (36.6 ?C) 97.7 ?F (36.5 ?C)  98.9 ?F (37.2 ?C)  ?TempSrc:  Oral    ?SpO2: 95% 92% 97% 92%  ?Weight:      ?Height:      ? ? ?  Intake/Output Summary (Last 24 hours) at 08/03/2021 1201 ?Last data filed at 08/03/2021 1191 ?Gross per 24 hour  ?Intake 1003.34 ml  ?Output 1850 ml  ?Net -846.66 ml  ? ?Filed Weights  ? 08/01/21 1005  ?Weight: 80.3 kg  ? ? ?Examination: ? ?General exam: NAD ?Respiratory  system: Bibasilar crackles.  Normal work of breathing.  4 L ?Cardiovascular system: S1-S2, regular rate, irregular rhythm, no murmurs, no pedal edema ?Gastrointestinal system: Soft, NT/ND, normal bowel sounds ?Central nervous system: Alert and oriented. No focal neurological deficits. ?Extremities: Symmetric 5 x 5 power. ?Skin: No rashes, lesions or ulcers ?Psychiatry: Judgement and insight appear normal. Mood & affect appropriate.  ? ? ? ?Data Reviewed: I have personally reviewed following labs and imaging studies ? ?CBC: ?Recent Labs  ?Lab 08/02/21 ?4782 08/03/21 ?0434  ?WBC 9.8 10.6*  ?HGB 11.8* 11.7*  ?HCT 36.6* 36.4*  ?MCV 100.8* 100.0  ?PLT 120* 122*  ? ?Basic Metabolic Panel: ?Recent Labs  ?Lab 08/02/21 ?0542  ?NA 144  ?K 4.4  ?CL 108  ?CO2 30  ?GLUCOSE 160*  ?BUN 17  ?CREATININE 0.67  ?CALCIUM 8.6*  ? ?GFR: ?Estimated Creatinine Clearance: 76 mL/min (by C-G formula based on SCr of 0.67 mg/dL). ?Liver Function Tests: ?No results for input(s): AST, ALT, ALKPHOS, BILITOT, PROT, ALBUMIN in the last 168 hours. ?No results for input(s): LIPASE, AMYLASE in the last 168 hours. ?No results for input(s): AMMONIA in the last 168 hours. ?Coagulation Profile: ?Recent Labs  ?Lab 08/02/21 ?9562 08/02/21 ?2202  ?INR 1.2 1.2  ? ?Cardiac Enzymes: ?No results for input(s): CKTOTAL, CKMB, CKMBINDEX, TROPONINI in the last 168 hours. ?BNP (last 3 results) ?No results for input(s): PROBNP in the last 8760 hours. ?HbA1C: ?No results for input(s): HGBA1C in the last 72 hours. ?CBG: ?No results for input(s): GLUCAP in the last 168 hours. ?Lipid Profile: ?No results for input(s): CHOL, HDL, LDLCALC, TRIG, CHOLHDL, LDLDIRECT in the last 72 hours. ?Thyroid Function Tests: ?No results for input(s): TSH, T4TOTAL, FREET4, T3FREE, THYROIDAB in the last 72 hours. ?Anemia Panel: ?Recent Labs  ?  08/02/21 ?2202  ?FERRITIN 447*  ? ?Sepsis Labs: ?Recent Labs  ?Lab 08/01/21 ?1045 08/01/21 ?1237 08/03/21 ?0434  ?PROCALCITON <0.10  --  <0.10   ?LATICACIDVEN 0.9 0.8  --   ? ? ?Recent Results (from the past 240 hour(s))  ?Expectorated Sputum Assessment w Gram Stain, Rflx to Resp Cult     Status: None  ? Collection Time: 08/01/21  3:45 AM  ? Specimen: Expectorated Sputum  ?Result Value Ref Range Status  ? Specimen Description EXPECTORATED SPUTUM  Final  ? Special Requests NONE  Final  ? Sputum evaluation   Final  ?  THIS SPECIMEN IS ACCEPTABLE FOR SPUTUM CULTURE ?Performed at [facility], [address] [place], [place] [address] ?  ? Report Status 08/02/2021 FINAL  Final  ?Culture, Respiratory w Gram Stain     Status: None (Preliminary result)  ? Collection Time: 08/01/21  3:45 AM  ?Result Value Ref Range Status  ? Specimen Description   Final  ?  EXPECTORATED SPUTUM ?Performed at [facility], [address] [place], [place] [address] ?  ? Special Requests   Final  ?  NONE Reflexed from [id number] ?Performed at [facility], [address], [place], [place] [address] ?  ? Gram Stain   Final  ?  RARE WBC PRESENT, PREDOMINANTLY MONONUCLEAR ?FEW SQUAMOUS EPITHELIAL CELLS PRESENT ?RARE GRAM POSITIVE COCCI IN PAIRS ?RARE GRAM NEGATIVE  COCCOBACILLI ?  ? Culture   Final  ?  CULTURE REINCUBATED FOR BETTER GROWTH ?Performed at [facility], [place] [address], [place], [place] [address] ?  ? Report Status PENDING  Incomplete  ?Blood culture (routine x 2)     Status: None (Preliminary result)  ? Collection Time: 08/01/21 10:45 AM  ? Specimen: BLOOD  ?Result Value Ref Range Status  ? Specimen Description BLOOD RIGHT AC  Final  ? Special Requests   Final  ?  BOTTLES DRAWN AEROBIC AND ANAEROBIC Blood Culture adequate volume  ? Culture   Final  ?  NO GROWTH 2 DAYS ?Performed at [facility], [address] [place], [place] [address] ?  ? Report Status PENDING  Incomplete  ?Resp Panel by RT-PCR (Flu A&B, Covid) Nasopharyngeal Swab     Status: None  ? Collection Time: 08/01/21 10:45 AM  ? Specimen: Nasopharyngeal Swab;  Nasopharyngeal(NP) swabs in vial transport medium  ?Result Value Ref Range Status  ? SARS Coronavirus 2 by RT PCR NEGATIVE NEGATIVE Final  ?  Comment: (NOTE) ?SARS-CoV-2 target nucleic acids are NOT DETECTED. ? ?The S

## 2021-08-03 NOTE — Consult Note (Signed)
ANTICOAGULATION CONSULT NOTE  ? ?Pharmacy Consult for heparin gtt (PTA DOAC) ?Indication: atrial fibrillation ? ?No Known Allergies ? ?Patient Measurements: ?Height: 5' 10.5" (179.1 cm) ?Weight: 80.3 kg (177 lb) ?IBW/kg (Calculated) : 74.15 ?Heparin Dosing Weight: 80.3kg ? ?Vital Signs: ?Temp: 97.8 ?F (36.6 ?C) (03/23 2015) ?BP: 131/73 (03/23 2015) ?Pulse Rate: 87 (03/23 2015) ? ?Labs: ?Recent Labs  ?  08/01/21 ?1045 08/01/21 ?1237 08/02/21 ?5053 08/02/21 ?1558 08/02/21 ?2202 08/02/21 ?2344  ?HGB  --   --  11.8*  --   --   --   ?HCT  --   --  36.6*  --   --   --   ?PLT  --   --  120*  --   --   --   ?APTT  --   --  38* 96*  --  98*  ?LABPROT  --   --  15.1  --  14.7  --   ?INR  --   --  1.2  --  1.2  --   ?HEPARINUNFRC  --   --  >1.10*  --   --  >1.10*  ?CREATININE  --   --  0.67  --   --   --   ?TROPONINIHS 18* 16  --   --   --   --   ? ? ? ?Estimated Creatinine Clearance: 76 mL/min (by C-G formula based on SCr of 0.67 mg/dL). ? ? ?Medications: ?NKDA.  ?Heparin Dosing Weight: 80.3kg ?PTA: Eliquis was just switched to Xarelto at cardiology office, but pt reportedly took both doses morning PTA. Last doses were AM of 08/01/2021. ?Inpatient: DOAC > Hep gtt. ? ?Assessment: ?81yo M w/ h/o HTN, COPD (on 3L BL O2), GERD, MDD/anxiety, BPH, Parkinson's disease, HBV, gastric ulcer disease, PAF on Eliquis (just switched to Xarelto), who presents with fever, shortness breath. ? ?Date Time aPTT/HL Rate/Comment ?3/23 0542 38s / >1.10 Baseline labs show no correlation; titrating by aPTT for now ?3/23 1558 96 ?3/23 2344 98 / > 1.10 aPTT therapeutic x 2 ?   ? ?Baseline Labs: ?aPTT - 38s ?INR - 1.2 ?Hgb - 11.8 ?Plts - 120 ? ?Goal of Therapy:  ?Heparin level 0.3-0.7 units/ml ?aPTT 66-102 seconds ?Monitor platelets by anticoagulation protocol: Yes ?  ?Plan:  ?NOTE: Eliquis was just switched to Xarelto at cardiology office, but pt reportedly took both doses morning of arrival, so last doses were AM of 08/01/2021. Based on d/w [facility] and  Cardiology, will start heparin at next dosing interval AM of 08/02/21. ? ?Continue heparin infusion at 1250 units/hr ?Recheck aPTT daily w/ AM labs while therapeutic  ?HL daily until lab correlation w/ aPTT is noted, then titrate by HL alone ?Continue to monitor H&H and platelets daily while on heparin gtt. ? ?Renda Rolls, PharmD, MBA ?08/03/2021 ?1:25 AM ? ? ? ? ?

## 2021-08-03 NOTE — Progress Notes (Signed)
? ? ? ?PULMONOLOGY ? ? ? ? ? ? ? ? ?Date: 08/03/2021,   ?MRN# [id number] Patrick Montoya 06/16/1940 ? ? ?  ?AdmissionWeight: 80.3 kg                 ?CurrentWeight: 80.3 kg ? ?Referring physician: Dr. Priscella Mann ? ? ?CHIEF COMPLAINT:  ? ?Chronic recurrent febrile illness with abnormal chest ? ? ?HISTORY OF PRESENT ILLNESS  ? ?81 year old male with history of basal cell carcinoma, BPH, advanced COPD with bullous centrilobular emphysema as well as bronchitis chronically and hypoxemia on 3 L/min nasal cannula at home.  History of gastric ulcer disease, Parkinson's, UTIs essential hypertension hepatitis B, came in with complaints of worsening shortness of breath as well as pleuritic chest pain coupled with volume in his phlegm which was discolored compared to baseline.  He denied flulike illness however did endorse fevers and chills chronically.  Lab work showed negative COVID-19 mild leukocytosis cardiac biomarkers are normal was found to be febrile on admission temperature of 102 had an EKG done which did not show QTc prolongation or ST elevation MI.  Pulmonary consultation placed for additional evaluation management of febrile illness with abnormal CT chest ? ?08/03/21- patient improved, ive reduced his O2 requirement to 3L/min . He feels close to baseline.  We may be able to reduce antibiotic and dc home in am.  ? ?PAST MEDICAL HISTORY  ? ?Past Medical History:  ?Diagnosis Date  ? Basal cell carcinoma 07/25/2015  ? L zygoma inf to lat [place]  ? Basal cell carcinoma 06/11/2018  ? L nasal ala  ? BPH (benign prostatic hyperplasia)   ? COPD (chronic obstructive pulmonary disease) ([place])   ? Elevated liver enzymes   ? Emphysema of lung ([place])   ? Gastric ulcer   ? GERD (gastroesophageal reflux disease)   ? Hepatitis B   ? Hypertension   ? Parkinson's disease ([place])   ? Prostate hypertrophy   ? Rotator cuff tear right  ? UTI (urinary tract infection)   ? ? ? ?SURGICAL HISTORY  ? ?Past Surgical History:  ?Procedure Laterality  Date  ? CHOLECYSTECTOMY    ? COLONOSCOPY WITH PROPOFOL N/A 10/20/2015  ? Procedure: COLONOSCOPY WITH PROPOFOL;  Surgeon: Manya Silvas, MD;  Location: [facility] ENDOSCOPY;  Service: Endoscopy;  Laterality: N/A;  ? COLONOSCOPY WITH PROPOFOL N/A 01/24/2021  ? Procedure: COLONOSCOPY WITH PROPOFOL;  Surgeon: Toledo, Benay Pike, MD;  Location: [facility] ENDOSCOPY;  Service: Gastroenterology;  Laterality: N/A;  ? ESOPHAGOGASTRODUODENOSCOPY (EGD) WITH PROPOFOL N/A 10/20/2015  ? Procedure: ESOPHAGOGASTRODUODENOSCOPY (EGD) WITH PROPOFOL;  Surgeon: Manya Silvas, MD;  Location: [facility] ENDOSCOPY;  Service: Endoscopy;  Laterality: N/A;  ? GREEN LIGHT LASER TURP (TRANSURETHRAL RESECTION OF PROSTATE N/A 11/17/2018  ? Procedure: GREEN LIGHT LASER TURP (TRANSURETHRAL RESECTION OF PROSTATE;  Surgeon: Royston Cowper, MD;  Location: [facility] ORS;  Service: Urology;  Laterality: N/A;  ? INGUINAL HERNIA REPAIR Right 02/18/2019  ? Procedure: HERNIA REPAIR INGUINAL ADULT;  Surgeon: Royston Cowper, MD;  Location: [facility] ORS;  Service: Urology;  Laterality: Right;  ? INSERTION OF MESH Right 02/18/2019  ? Procedure: INSERTION OF MESH;  Surgeon: Royston Cowper, MD;  Location: [facility] ORS;  Service: Urology;  Laterality: Right;  ? TONSILLECTOMY    ? TRANSURETHRAL MICROWAVE THERAPY    ? ? ? ?FAMILY HISTORY  ? ?Family History  ?Problem Relation Age of Onset  ? Colon cancer Mother   ? Hepatitis Son   ? Prostate cancer Neg Hx   ?  Kidney cancer Neg Hx   ? ? ? ?SOCIAL HISTORY  ? ?Social History  ? ?Tobacco Use  ? Smoking status: Former  ?  Packs/day: 1.00  ?  Years: 62.00  ?  Pack years: 62.00  ?  Types: Cigarettes  ? Smokeless tobacco: Never  ?Vaping Use  ? Vaping Use: Never used  ?Substance Use Topics  ? Alcohol use: Yes  ?  Comment: occasional  ? Drug use: No  ? ? ? ?MEDICATIONS  ? ? ?Home Medication:  ?  ?Current Medication: ? ?Current Facility-Administered Medications:  ?  acetaminophen (TYLENOL) tablet 650 mg, 650 mg, Oral, Q6H PRN, Ivor Costa, MD ?  ALPRAZolam  Duanne Moron) tablet 0.5 mg, 0.5 mg, Oral, QHS PRN, Ivor Costa, MD, 0.5 mg at 08/02/21 2039 ?  carbidopa-levodopa (SINEMET IR) 25-100 MG per tablet immediate release 2 tablet, 2 tablet, Oral, 5 X Daily, Ivor Costa, MD, 2 tablet at 08/03/21 0932 ?  ceFEPIme (MAXIPIME) 2 g in sodium chloride 0.9 % 100 mL IVPB, 2 g, Intravenous, Q8H, Nazari, Walid A, RPH, Last Rate: 200 mL/hr at 08/03/21 0619, 2 g at 08/03/21 3557 ?  dextromethorphan-guaiFENesin (MUCINEX DM) 30-600 MG per 12 hr tablet 1 tablet, 1 tablet, Oral, BID PRN, Ivor Costa, MD ?  doxazosin (CARDURA) tablet 4 mg, 4 mg, Oral, Daily, Ivor Costa, MD, 4 mg at 08/02/21 3220 ?  ferrous gluconate (FERGON) tablet 324 mg, 324 mg, Oral, Q breakfast, Ivor Costa, MD, 324 mg at 08/02/21 2542 ?  heparin ADULT infusion 100 units/mL (25000 units/259m), 1,250 Units/hr, Intravenous, Continuous, Beers, BShanon Brow RPH, Last Rate: 12.5 mL/hr at 08/03/21 0150, 1,250 Units/hr at 08/03/21 0150 ?  hydrALAZINE (APRESOLINE) injection 5 mg, 5 mg, Intravenous, Q2H PRN, NIvor Costa MD ?  ipratropium-albuterol (DUONEB) 0.5-2.5 (3) MG/3ML nebulizer solution 3 mL, 3 mL, Nebulization, Q4H WA, Sreenath, Sudheer B, MD, 3 mL at 08/03/21 0730 ?  methylPREDNISolone sodium succinate (SOLU-MEDROL) 40 mg/mL injection 40 mg, 40 mg, Intravenous, Q12H, NIvor Costa MD, 40 mg at 08/02/21 2040 ?  metoprolol tartrate (LOPRESSOR) tablet 25 mg, 25 mg, Oral, BID, NIvor Costa MD, 25 mg at 08/02/21 2039 ?  mometasone-formoterol (DULERA) 200-5 MCG/ACT inhaler 2 puff, 2 puff, Inhalation, BID, NIvor Costa MD, 2 puff at 08/02/21 2041 ?  ondansetron (ZOFRAN) injection 4 mg, 4 mg, Intravenous, Q8H PRN, NIvor Costa MD ?  pantoprazole (PROTONIX) EC tablet 40 mg, 40 mg, Oral, Daily, NIvor Costa MD, 40 mg at 08/02/21 0850 ?  QUEtiapine (SEROQUEL) tablet 50 mg, 50 mg, Oral, QHS, NIvor Costa MD, 50 mg at 08/02/21 2039 ?  vancomycin (VANCOREADY) IVPB 2000 mg/400 mL, 2,000 mg, Intravenous, Q24H, NPearla Dubonnet RPH, Stopped at  08/03/21 0303 ?  vitamin B-12 (CYANOCOBALAMIN) tablet 2,000 mcg, 2,000 mcg, Oral, Daily, NIvor Costa MD, 2,000 mcg at 08/02/21 07062? ? ? ?ALLERGIES  ? ?Patient has no known allergies. ? ? ? ? ?REVIEW OF SYSTEMS  ? ? ?Review of Systems: ? ?Gen:  Denies  fever, sweats, chills weigh loss  ?HEENT: Denies blurred vision, double vision, ear pain, eye pain, hearing loss, nose bleeds, sore throat ?Cardiac:  No dizziness, chest pain or heaviness, chest tightness,edema ?Resp:   Denies cough or sputum porduction, shortness of breath,wheezing, hemoptysis,  ?Gi: Denies swallowing difficulty, stomach pain, nausea or vomiting, diarrhea, constipation, bowel incontinence ?Gu:  Denies bladder incontinence, burning urine ?Ext:   Denies Joint pain, stiffness or swelling ?Skin: Denies  skin rash, easy bruising or bleeding or hives ?Endoc:  Denies polyuria, polydipsia , polyphagia or weight change ?Psych:   Denies depression, insomnia or hallucinations  ? ?Other:  All other systems negative ? ? ?VS: BP (!) 147/90 (BP Location: Left Arm)   Pulse 71   Temp 97.7 ?F (36.5 ?C) (Oral)   Resp 20   Ht 5' 10.5" (1.791 m)   Wt 80.3 kg   SpO2 97%   BMI 25.04 kg/m?   ? ? ? ?PHYSICAL EXAM  ? ? ?GENERAL:NAD, no fevers, chills, no weakness no fatigue ?HEAD: Normocephalic, atraumatic.  ?EYES: Pupils equal, round, reactive to light. Extraocular muscles intact. No scleral icterus.  ?MOUTH: Moist mucosal membrane. Dentition intact. No abscess noted.  ?EAR, NOSE, THROAT: Clear without exudates. No external lesions.  ?NECK: Supple. No thyromegaly. No nodules. No JVD.  ?PULMONARY: decreased breath sounds with mild rhonchi worse at bases bilaterally.  ?CARDIOVASCULAR: S1 and S2. Regular rate and rhythm. No murmurs, rubs, or gallops. No edema. Pedal pulses 2+ bilaterally.  ?GASTROINTESTINAL: Soft, nontender, nondistended. No masses. Positive bowel sounds. No hepatosplenomegaly.  ?MUSCULOSKELETAL: No swelling, clubbing, or edema. Range of motion full in  all extremities.  ?NEUROLOGIC: Cranial nerves II through XII are intact. No gross focal neurological deficits. Sensation intact. Reflexes intact.  ?SKIN: No ulceration, lesions, rashes, or cyanosis. Skin warm and dry

## 2021-08-03 NOTE — Care Management Important Message (Signed)
Important Message ? ?Patient Details  ?Name: Patrick Montoya ?MRN: [id number] ?Date of Birth: 1941-04-19 ? ? ?Medicare Important Message Given:  N/A - LOS <3 / Initial given by admissions ? ? ? ? ?Juliann Pulse A Sumit Branham ?08/03/2021, 7:53 AM ?

## 2021-08-03 NOTE — Consult Note (Addendum)
Pharmacy Antibiotic Note ? ?Patrick Montoya is a 81 y.o. male admitted on 08/01/2021 with pneumonia.  Pharmacy has been consulted for Vancomycin and Cefepime dosing. ? ?Plan:  ? ?Per D/W pulmonology --> Sputum culture showing rare GPC in pairs & rare GN coccobacilli (could represent strep & H. Flu) >> PCT neg x2, MRSA PCR negative, lactate neg ; Afeb x48hr. Plan for de-escalation to standard coverage with Rocephin & Azithro x5d for now. ?Stopping on day 2 of Vanc/Cefepime at this time (See above) ?Starting Rocephin 2g IV q24h at next dosing interval ?Starting Azithromycin '500mg'$  q24h at same time ? ?Height: 5' 10.5" (179.1 cm) ?Weight: 80.3 kg (177 lb) ?IBW/kg (Calculated) : 74.15 ? ?Temp (24hrs), Avg:98.2 ?F (36.8 ?C), Min:97.7 ?F (36.5 ?C), Max:98.9 ?F (37.2 ?C) ? ?Recent Labs  ?Lab 08/01/21 ?1045 08/01/21 ?1237 08/02/21 ?9480 08/03/21 ?0434  ?WBC  --   --  9.8 10.6*  ?CREATININE  --   --  0.67  --   ?LATICACIDVEN 0.9 0.8  --   --   ? ?  ?Estimated Creatinine Clearance: 76 mL/min (by C-G formula based on SCr of 0.67 mg/dL).   ? ?No Known Allergies ? ?Antimicrobials this admission: ?Azithro/CRO 3/22 x 1 ?Vancomycin & Cefepime (3/22 >> 3/24) ?Ceftriaxone/Azithromycin (3/24>> ? ?Dose adjustments this admission: ?N/A ? ?Microbiology results: ?3/22 Sputum cx- rare GPC in pairs  & rare GN coccobacilli  ?3/22 Bcx - NGTD ?3/22 AFB pending ?3/22 Flu/Covid/RVP- negative ?3/23 MRSA pcr - negative ? ? ?Thank you for allowing pharmacy to be a part of this patient?s care. ? ?Patrick Montoya ?08/03/2021 12:00 PM ? ?

## 2021-08-04 LAB — HEPATITIS B E ANTIGEN: Hep B E Ag: NEGATIVE

## 2021-08-04 LAB — APTT: aPTT: 107 seconds — ABNORMAL HIGH (ref 24–36)

## 2021-08-04 LAB — CBC
HCT: 35.4 % — ABNORMAL LOW (ref 39.0–52.0)
Hemoglobin: 11.2 g/dL — ABNORMAL LOW (ref 13.0–17.0)
MCH: 31.9 pg (ref 26.0–34.0)
MCHC: 31.6 g/dL (ref 30.0–36.0)
MCV: 100.9 fL — ABNORMAL HIGH (ref 80.0–100.0)
Platelets: 126 10*3/uL — ABNORMAL LOW (ref 150–400)
RBC: 3.51 MIL/uL — ABNORMAL LOW (ref 4.22–5.81)
RDW: 14.2 % (ref 11.5–15.5)
WBC: 8.6 10*3/uL (ref 4.0–10.5)
nRBC: 0 % (ref 0.0–0.2)

## 2021-08-04 LAB — AFP TUMOR MARKER: AFP, Serum, Tumor Marker: 3.4 ng/mL (ref 0.0–6.4)

## 2021-08-04 LAB — HEPARIN LEVEL (UNFRACTIONATED): Heparin Unfractionated: 0.64 IU/mL (ref 0.30–0.70)

## 2021-08-04 LAB — CULTURE, RESPIRATORY W GRAM STAIN: Culture: NORMAL

## 2021-08-04 LAB — PROCALCITONIN: Procalcitonin: <0.1 ng/mL

## 2021-08-04 MED ORDER — AMOXICILLIN-POT CLAVULANATE 875-125 MG PO TABS: 1.0000 | ORAL_TABLET | Freq: Two times a day (BID) | ORAL | 0 refills | Status: AC

## 2021-08-04 MED ORDER — RIVAROXABAN 20 MG PO TABS
20.0000 mg | ORAL_TABLET | Freq: Every day | ORAL | Status: DC
Start: 2021-08-04 — End: 2021-08-04
  Filled 2021-08-04: qty 1

## 2021-08-04 MED ORDER — PREDNISONE 20 MG PO TABS: 40.0000 mg | ORAL_TABLET | Freq: Every day | ORAL | 0 refills | Status: AC

## 2021-08-04 NOTE — Consult Note (Signed)
ANTICOAGULATION CONSULT NOTE  ? ?Pharmacy Consult for heparin gtt (PTA DOAC) ?Indication: atrial fibrillation ? ?No Known Allergies ? ?Patient Measurements: ?Height: 5' 10.5" (179.1 cm) ?Weight: 80.3 kg (177 lb) ?IBW/kg (Calculated) : 74.15 ?Heparin Dosing Weight: 80.3kg ? ?Vital Signs: ?Temp: 98 ?F (36.7 ?C) (03/25 0358) ?Temp Source: Oral (03/24 2356) ?BP: 143/84 (03/25 0358) ?Pulse Rate: 73 (03/25 0358) ? ?Labs: ?Recent Labs  ?  08/01/21 ?1045 08/01/21 ?1237 08/02/21 ?1610 08/02/21 ?9604 08/02/21 ?1558 08/02/21 ?2202 08/02/21 ?2344 08/03/21 ?0434 08/04/21 ?0500  ?HGB  --   --  11.8*   < >  --   --   --  11.7* 11.2*  ?HCT  --   --  36.6*  --   --   --   --  36.4* 35.4*  ?PLT  --   --  120*  --   --   --   --  122* 126*  ?APTT  --   --  38*   < > 96*  --  98*  --  107*  ?LABPROT  --   --  15.1  --   --  14.7  --   --   --   ?INR  --   --  1.2  --   --  1.2  --   --   --   ?HEPARINUNFRC  --   --  >1.10*  --   --   --  >1.10*  --  0.64  ?CREATININE  --   --  0.67  --   --   --   --   --   --   ?TROPONINIHS 18* 16  --   --   --   --   --   --   --   ? < > = values in this interval not displayed.  ? ? ? ?Estimated Creatinine Clearance: 76 mL/min (by C-G formula based on SCr of 0.67 mg/dL). ? ? ?Medications: ?NKDA.  ?Heparin Dosing Weight: 80.3kg ?PTA: Eliquis was just switched to Xarelto at cardiology office, but pt reportedly took both doses morning PTA. Last doses were AM of 08/01/2021. ?Inpatient: DOAC > Hep gtt. ? ?Assessment: ?81yo M w/ h/o HTN, COPD (on 3L BL O2), GERD, MDD/anxiety, BPH, Parkinson's disease, HBV, gastric ulcer disease, PAF on Eliquis (just switched to Xarelto), who presents with fever, shortness breath. ? ?Date Time aPTT/HL Rate/Comment ?3/23 0542 38s / >1.10 Baseline labs show no correlation; titrating by aPTT for now ?3/23 1558 96 ?3/23 2344 98 / > 1.10 aPTT therapeutic x 2 ?3/25 0500 107 / 0.64 aPTT slightly supratherapeutic, HL therapeutic x 1 ? ?3/25:  Eliquis was just switched to Xarelto at  cardiology office prior to admission, but pt reportedly took both doses morning of arrival. Transitioning from heparin infusion to Xarelto. ?   ? ?Baseline Labs: ?aPTT - 38s ?INR - 1.2 ?Hgb - 11.8 ?Plts - 120 ? ?Goal of Therapy:  ?Heparin level 0.3-0.7 units/ml ?aPTT 66-102 seconds ?Monitor platelets by anticoagulation protocol: Yes ?  ?Plan:  ?--Will discontinue heparin infusion. ?--Rivaroxaban '20mg'$  daily ?--CBC daily ? ? ? ?

## 2021-08-04 NOTE — TOC Transition Note (Addendum)
Transition of Care (TOC) - CM/SW Discharge Note ? ? ?Patient Details  ?Name: Patrick Montoya ?MRN: [id number] ?Date of Birth: 16-Sep-1940 ? ?Transition of Care (TOC) CM/SW Contact:  ?Melony Tenpas, [place], [place] ?Phone Number: ?08/04/2021, 11:57 AM ? ? ?Clinical Narrative:    ?Patient to discharge home today. Son at bedside and will provide transportation home. Coupon provided for Xarelto. No additional TOC needs. Patient to follow up with Pulmonology, Cardiology , and internal medicine. ? ?[employer], LCSW ?Transition of Care ?[phone] ? ? ? ? ?Final next level of care: Home/Self Care ?Barriers to Discharge: Barriers Resolved ? ? ?Patient Goals and CMS Choice ?Patient states their goals for this hospitalization and ongoing recovery are:: to return home ?  ?  ? ?Discharge Placement ?  ?           ?  ?  ?  ?  ? ?Discharge Plan and Services ?  ?Discharge Planning Services: Medication Assistance (Coupon provided for xarelto) ?           ?  ?  ?  ?  ?  ?  ?  ?  ?  ?  ? ?Social Determinants of Health (SDOH) Interventions ?  ? ? ?Readmission Risk Interventions ? ?  07/25/2021  ?  9:58 AM  ?Readmission Risk Prevention Plan  ?Transportation Screening Complete  ?PCP or Specialist Appt within 3-5 Days Complete  ?[place] or Home Care Consult Complete  ?Palliative Care Screening Not Applicable  ?Medication Review Press photographer) Complete  ? ? ? ? ? ?

## 2021-08-04 NOTE — Consult Note (Signed)
ANTICOAGULATION CONSULT NOTE  ? ?Pharmacy Consult for heparin gtt (PTA DOAC) ?Indication: atrial fibrillation ? ?No Known Allergies ? ?Patient Measurements: ?Height: 5' 10.5" (179.1 cm) ?Weight: 80.3 kg (177 lb) ?IBW/kg (Calculated) : 74.15 ?Heparin Dosing Weight: 80.3kg ? ?Vital Signs: ?Temp: 98 ?F (36.7 ?C) (03/25 0358) ?Temp Source: Oral (03/24 2356) ?BP: 143/84 (03/25 0358) ?Pulse Rate: 73 (03/25 0358) ? ?Labs: ?Recent Labs  ?  08/01/21 ?1045 08/01/21 ?1237 08/02/21 ?9509 08/02/21 ?3267 08/02/21 ?1558 08/02/21 ?2202 08/02/21 ?2344 08/03/21 ?0434 08/04/21 ?0500  ?HGB  --   --  11.8*  --   --   --   --  11.7*  --   ?HCT  --   --  36.6*  --   --   --   --  36.4*  --   ?PLT  --   --  120*  --   --   --   --  122*  --   ?APTT  --   --  38*   < > 96*  --  98*  --  107*  ?LABPROT  --   --  15.1  --   --  14.7  --   --   --   ?INR  --   --  1.2  --   --  1.2  --   --   --   ?HEPARINUNFRC  --   --  >1.10*  --   --   --  >1.10*  --  0.64  ?CREATININE  --   --  0.67  --   --   --   --   --   --   ?TROPONINIHS 18* 16  --   --   --   --   --   --   --   ? < > = values in this interval not displayed.  ? ? ? ?Estimated Creatinine Clearance: 76 mL/min (by C-G formula based on SCr of 0.67 mg/dL). ? ? ?Medications: ?NKDA.  ?Heparin Dosing Weight: 80.3kg ?PTA: Eliquis was just switched to Xarelto at cardiology office, but pt reportedly took both doses morning PTA. Last doses were AM of 08/01/2021. ?Inpatient: DOAC > Hep gtt. ? ?Assessment: ?81yo M w/ h/o HTN, COPD (on 3L BL O2), GERD, MDD/anxiety, BPH, Parkinson's disease, HBV, gastric ulcer disease, PAF on Eliquis (just switched to Xarelto), who presents with fever, shortness breath. ? ?Date Time aPTT/HL Rate/Comment ?3/23 0542 38s / >1.10 Baseline labs show no correlation; titrating by aPTT for now ?3/23 1558 96 ?3/23 2344 98 / > 1.10 aPTT therapeutic x 2 ?3/25 0500 107 / 0.64 aPTT slightly supratherapeutic, HL therapeutic x 1 ?   ? ?Baseline Labs: ?aPTT - 38s ?INR - 1.2 ?Hgb -  11.8 ?Plts - 120 ? ?Goal of Therapy:  ?Heparin level 0.3-0.7 units/ml ?aPTT 66-102 seconds ?Monitor platelets by anticoagulation protocol: Yes ?  ?Plan:  ?NOTE: Eliquis was just switched to Xarelto at cardiology office, but pt reportedly took both doses morning of arrival, so last doses were AM of 08/01/2021. Based on d/w [facility] and Cardiology, will start heparin at next dosing interval AM of 08/02/21. ? ?Decrease heparin drip slightly to 1200 units/hr ?Will recheck aPTT and HL in 8 hrs after rate change to reconfirm therapeutic lvl and correlation, then will follow HL alone. ?Continue to monitor H&H and platelets daily while on heparin gtt. ? ?Renda Rolls, PharmD, MBA ?08/04/2021 ?5:58 AM ? ? ? ? ?

## 2021-08-04 NOTE — Progress Notes (Signed)
TOC previously provided the pt with a coupon in order to obtain the Xarelto at the [facility] on [address] in [place], [place]; pt's son present with the pt's personal O2 tank; pt discharged via wheelchair by nursing to the [facility] entrance ?

## 2021-08-04 NOTE — Plan of Care (Signed)
?  Problem: Education: ?Goal: Knowledge of disease or condition will improve ?Outcome: Adequate for Discharge ?Goal: Knowledge of the prescribed therapeutic regimen will improve ?Outcome: Adequate for Discharge ?Goal: Individualized Educational Video(s) ?Outcome: Adequate for Discharge ?  ?Problem: Activity: ?Goal: Ability to tolerate increased activity will improve ?Outcome: Adequate for Discharge ?Goal: Will verbalize the importance of balancing activity with adequate rest periods ?Outcome: Adequate for Discharge ?  ?Problem: Respiratory: ?Goal: Ability to maintain a clear airway will improve ?Outcome: Adequate for Discharge ?Goal: Levels of oxygenation will improve ?Outcome: Adequate for Discharge ?Goal: Ability to maintain adequate ventilation will improve ?Outcome: Adequate for Discharge ?  ?Problem: Activity: ?Goal: Ability to tolerate increased activity will improve ?Outcome: Adequate for Discharge ?  ?Problem: Clinical Measurements: ?Goal: Ability to maintain a body temperature in the normal range will improve ?Outcome: Adequate for Discharge ?  ?Problem: Respiratory: ?Goal: Ability to maintain adequate ventilation will improve ?Outcome: Adequate for Discharge ?Goal: Ability to maintain a clear airway will improve ?Outcome: Adequate for Discharge ?  ?Problem: Health Behavior/Discharge Planning: ?Goal: Ability to manage health-related needs will improve ?Outcome: Adequate for Discharge ?  ?Problem: Education: ?Goal: Knowledge of General Education information will improve ?Description: Including pain rating scale, medication(s)/side effects and non-pharmacologic comfort measures ?Outcome: Adequate for Discharge ?  ?Problem: Health Behavior/Discharge Planning: ?Goal: Ability to manage health-related needs will improve ?Outcome: Adequate for Discharge ?  ?Problem: Clinical Measurements: ?Goal: Ability to maintain clinical measurements within normal limits will improve ?Outcome: Adequate for Discharge ?Goal: Will  remain free from infection ?Outcome: Adequate for Discharge ?Goal: Diagnostic test results will improve ?Outcome: Adequate for Discharge ?Goal: Respiratory complications will improve ?Outcome: Adequate for Discharge ?Goal: Cardiovascular complication will be avoided ?Outcome: Adequate for Discharge ?  ?Problem: Activity: ?Goal: Risk for activity intolerance will decrease ?Outcome: Adequate for Discharge ?  ?Problem: Nutrition: ?Goal: Adequate nutrition will be maintained ?Outcome: Adequate for Discharge ?  ?Problem: Coping: ?Goal: Level of anxiety will decrease ?Outcome: Adequate for Discharge ?  ?Problem: Elimination: ?Goal: Will not experience complications related to bowel motility ?Outcome: Adequate for Discharge ?Goal: Will not experience complications related to urinary retention ?Outcome: Adequate for Discharge ?  ?Problem: Pain Managment: ?Goal: General experience of comfort will improve ?Outcome: Adequate for Discharge ?  ?Problem: Safety: ?Goal: Ability to remain free from injury will improve ?Outcome: Adequate for Discharge ?  ?Problem: Skin Integrity: ?Goal: Risk for impaired skin integrity will decrease ?Outcome: Adequate for Discharge ?  ?

## 2021-08-04 NOTE — Progress Notes (Signed)
MD order received in [facility] to discharge pt home today; verbally reviewed AVS with pt including Rxs previously escribed to the [facility] on [address] in [place], [place] for Amoxicillin and Prednisone; pt will call on Monday, 08/06/21 to schedule follow up appointments; no questions voiced at this time; pt's discharge pending arrival of his son with his portable O2 tank ?

## 2021-08-04 NOTE — Discharge Summary (Signed)
Physician Discharge Summary  ?Patrick Montoya EHU:[id number] DOB: 11/10/1940 DOA: 08/01/2021 ? ?PCP: Tracie Harrier, MD ? ?Admit date: 08/01/2021 ?Discharge date: 08/04/2021 ? ?Admitted From: Home ?Disposition: Home ? ?Recommendations for Outpatient Follow-up:  ?Follow up with PCP in 1-2 weeks ?Follow-up with pulmonology in 2 weeks ? ?Home Health: No ?Equipment/Devices: Oxygen 3 L via nasal cannula ? ?Discharge Condition: Stable ?CODE STATUS: Full ?Diet recommendation: Regular ? ?Brief/Interim Summary: ?81 y.o. male with medical history significant of hypertension, COPD on 3 L oxygen at home, GERD, depression, anxiety, BPH, Parkinson's disease, HBV, gastric ulcer disease, PAF on Eliquis (just switched to Xarelto), who presents with fever, shortness breath. ?  ?Patient was recently hospitalized from 3/12 - 3/15 due to COPD exacerbation.  She has shortness breath and productive cough with yellow-colored sputum production for about 1 week, which has been progressively worsening.  Patient has mild left-sided pleuritic sharp chest pain.  Denies nausea, vomiting, diarrhea or abdominal pain.  No symptoms of UTI.  Patient was seen by cardiologist in the office today, and was sent to ED for further evaluation and treatment.  Of note, patient used to be on Eliquis for atrial fibrillation, which was switched to Xarelto ?  ?Patient had fever on admission.  Oxygen saturation low 90s on 4 L.  CT angio negative for PE but with multifocal infiltration consistent with pneumonia. ? ?On day of discharge patient back on 3 L.  Symptomatically improved.  Infectious work-up has been unrevealing while inpatient.  Stable for discharge home at this time.  Will transition to Augmentin to complete additional 7 days for total 10-day antibiotic course.  Patient instructed to follow-up with pulmonology as outpatient for repeat chest CT and further investigation to etiology of recurrent fevers. ? ? ? ?Discharge Diagnoses:  ?Principal Problem: ?   Multifocal pneumonia ?Active Problems: ?  COPD exacerbation ([place]) ?  Acute on chronic respiratory failure with hypoxia (HCC) ?  Paroxysmal atrial fibrillation (HCC) ?  Parkinson's disease ([place]) ?  HTN (hypertension) ?  Sepsis ([place]) ?  BPH (benign prostatic hyperplasia) ? ?Acute on chronic respiratory failure with hypoxia and sepsis due to multifocal pneumonia and  COPD exacerbation ?CT angiogram is negative for PE, but showed multifocal infiltration, patient has wheezing, consistent with COPD exacerbation.   ?Patient meets criteria for sepsis with RR 30 and fever 102.2.   ?Currently on 4 L oxygen.  Lactic acid is normal.  ? -Patient has had recurrent fevers over the past 6 or 7 months without clear etiology identified.  Recurrent hospitalizations ?MRSA screen negative ?RVP negative ?Legionella and streptococcal antigen negative ?Blood cultures no growth to date ?Plan: ?No atypical infection has been identified.  Patient stable for discharge at this time.  De-escalate to Augmentin, complete additional 7-day course for total 10-day antibiotic course.  5-day course of prednisone ordered.  Can resume home bronchodilators.  Follow-up with PCP and pulmonology postdischarge. ?  ?Paroxysmal atrial fibrillation ([place]) ?Patient was recently switched to Xarelto from Eliquis ?He was inadvertently taking both ?Per cardiology note from 3/22 recommend admission and transition to IV heparin ?Plan: ?IV heparin was continued in house.  As no plans for bronchoscopy will discontinue and resume home Eliquis.  Outpatient follow-up with PCP and pulmonology ?  ?Parkinson's disease ([place]) ?-Sinemet ?-Metoprolol ?  ?HTN (hypertension) ?Resume home regimen ? ?BPH (benign prostatic hyperplasia) ?-Cardura ? ?Discharge Instructions ? ?Discharge Instructions   ? ? Diet - low sodium heart healthy   Complete by: As directed ?  ? Increase  activity slowly   Complete by: As directed ?  ? ?  ? ?Allergies as of 08/04/2021   ?No Known Allergies ?  ? ?   ?Medication List  ?  ? ?STOP taking these medications   ? ?apixaban 5 MG Tabs tablet ?Commonly known as: ELIQUIS ?  ? ?  ? ?TAKE these medications   ? ?albuterol 108 (90 Base) MCG/ACT inhaler ?Commonly known as: VENTOLIN HFA ?Inhale 2 puffs into the lungs every 6 (six) hours as needed for wheezing or shortness of breath. ?  ?ALPRAZolam 0.5 MG tablet ?Commonly known as: Duanne Moron ?Take 0.5 mg by mouth at bedtime as needed for anxiety or sleep. ?  ?amoxicillin-clavulanate 875-125 MG tablet ?Commonly known as: Augmentin ?Take 1 tablet by mouth 2 (two) times daily for 7 days. ?  ?budesonide-formoterol 160-4.5 MCG/ACT inhaler ?Commonly known as: Symbicort ?Inhale 2 puffs into the lungs 2 (two) times daily. ?  ?carbidopa-levodopa 25-100 MG tablet ?Commonly known as: SINEMET IR ?Take 2 tablets by mouth 5 (five) times daily. ?  ?[employer] Vitamin B-12 2000 MCG Tbcr ?Generic drug: Cyanocobalamin ?Take 2,000 mcg by mouth daily. ?  ?doxazosin 4 MG tablet ?Commonly known as: CARDURA ?Take 4 mg by mouth daily. ?  ?ferrous gluconate 324 MG tablet ?Commonly known as: FERGON ?Take 324 mg by mouth daily with breakfast. ?  ?metoprolol tartrate 25 MG tablet ?Commonly known as: LOPRESSOR ?Take 1 tablet (25 mg total) by mouth 2 (two) times daily. ?  ?omeprazole 40 MG capsule ?Commonly known as: PRILOSEC ?Take 40 mg by mouth daily. ?  ?QUEtiapine 50 MG tablet ?Commonly known as: SEROQUEL ?Take 50 mg by mouth at bedtime. ?  ?rizatriptan 10 MG tablet ?Commonly known as: MAXALT ?Take 10 mg by mouth as needed for migraine. May repeat in 2 hours if needed ?  ?tiotropium 18 MCG inhalation capsule ?Commonly known as: SPIRIVA ?Place 1 capsule (18 mcg total) into inhaler and inhale daily. ?  ?Xarelto 20 MG Tabs tablet ?Generic drug: rivaroxaban ?Take 20 mg by mouth daily. ?  ? ?  ? ? Follow-up Information   ? ? Tracie Harrier, MD. Schedule an appointment as soon as possible for a visit in 1 week(s).   ?Specialty: Internal Medicine ?Why: Patient to make  own follow up appt Office closed at this time ?Contact information: ?[address] ?[place] [address] ?[phone] ? ? ?  ?  ? ? Vickie Epley, MD .   ?Specialties: Cardiology, Radiology ?Why: Patient to make own follow up appt Office closed at this time ?Contact information: ?[address] ?[place] [address] ?[phone] ? ? ?  ?  ? ? Ottie Glazier, MD. Schedule an appointment as soon as possible for a visit in 2 week(s).   ?Specialty: Pulmonary Disease ?Why: Patient to make own follow up appt Office closed at this time ?Contact information: ?[address] ?[place] [address] ?[phone] ? ? ?  ?  ? ?  ?  ? ?  ? ?No Known Allergies ? ?Consultations: ?Pulmonology ? ? ?Procedures/Studies: ?DG Chest 2 View ? ?Result Date: 08/01/2021 ?CLINICAL DATA:  COPD with fever EXAM: CHEST - 2 VIEW COMPARISON:  07/22/2021 FINDINGS: COPD with pulmonary hyperinflation. Heart size and vascularity normal. Atherosclerotic calcification aortic arch Bibasilar airspace disease. Mild progression of right lower lobe airspace disease. Mild improvement in left lower lobe airspace disease. No significant effusion. IMPRESSION: COPD. Mild progression of right lower lobe airspace disease and mild improvement  in left lower lobe airspace disease. Possible pneumonia. Electronically Signed   By: Franchot Gallo M.D.   On: 08/01/2021 10:43  ? ?CT Angio Chest PE W and/or Wo Contrast ? ?Result Date: 08/01/2021 ?CLINICAL DATA:  Fever, chest pain, shortness of breath EXAM: CT ANGIOGRAPHY CHEST WITH CONTRAST TECHNIQUE: Multidetector CT imaging of the chest was performed using the standard protocol during bolus administration of intravenous contrast. Multiplanar CT image reconstructions and MIPs were obtained to evaluate the vascular anatomy. RADIATION DOSE REDUCTION: This exam was performed according to the departmental dose-optimization program which includes automated exposure control,  adjustment of the mA and/or kV according to patient size and/or use of iterative reconstruction technique. CONTRAST:  43m OMNIPAQUE IOHEXOL 350 MG/ML SOLN COMPARISON:  02/26/2021 FINDINGS: Cardiovascular: There

## 2021-08-05 LAB — FUNGITELL, SERUM: Fungitell Result: <31 pg/mL (ref <80)

## 2021-08-06 LAB — CULTURE, RESPIRATORY W GRAM STAIN

## 2021-08-06 LAB — CULTURE, BLOOD (ROUTINE X 2)
Culture: NO GROWTH
Culture: NO GROWTH
Special Requests: ADEQUATE

## 2021-08-08 LAB — HISTOPLASMA GAL'MANNAN AG SER: Histoplasma Gal'mannan Ag Ser: <0.5 (ref <0.5)

## 2021-08-22 ENCOUNTER — Other Ambulatory Visit: Payer: Self-pay | Admitting: Internal Medicine

## 2021-08-22 DIAGNOSIS — R7989 Other specified abnormal findings of blood chemistry: Secondary | ICD-10-CM

## 2021-08-29 ENCOUNTER — Ambulatory Visit: Admission: RE | Admit: 2021-08-29 | Payer: Medicare Other | Source: Ambulatory Visit

## 2021-08-30 ENCOUNTER — Other Ambulatory Visit: Payer: Medicare Other

## 2021-09-18 NOTE — Progress Notes (Signed)
?Electrophysiology Office Follow up Visit Note:   ? ?Date:  09/19/2021  ? ?ID:  Patrick Montoya, DOB 1940/06/25, MRN [id number] ? ?PCP:  Tracie Harrier, MD  ?[facility] Cardiologist:  None  ?[facility] Electrophysiologist:  Vickie Epley, MD  ? ? ?Interval History:   ? ?Patrick Montoya is a 81 y.o. male who presents for a follow up visit. I last saw the patient 08/01/2021 for AF. At that visit he was febrile and family reported recent hallucinations. He was referred to the ER.  ? ?He is doing much better and thinks that he is finally cleared his pneumonia.  He is still on supplemental oxygen.  He has not been able to afford NOAC for his atrial fibrillation.  He does continue to have salvos of atrial fibrillation with rapid ventricular rates although they are rare and spontaneously resolve after several hours.  He continues to take Toprol 25 mg by mouth once daily and tolerates the drug. ? ? ? ?  ? ?Past Medical History:  ?Diagnosis Date  ? Basal cell carcinoma 07/25/2015  ? L zygoma inf to lat canthus  ? Basal cell carcinoma 06/11/2018  ? L nasal ala  ? BPH (benign prostatic hyperplasia)   ? COPD (chronic obstructive pulmonary disease) ([place])   ? Elevated liver enzymes   ? Emphysema of lung ([place])   ? Gastric ulcer   ? GERD (gastroesophageal reflux disease)   ? Hepatitis B   ? Hypertension   ? Parkinson's disease ([place])   ? Prostate hypertrophy   ? Rotator cuff tear right  ? UTI (urinary tract infection)   ? ? ?Past Surgical History:  ?Procedure Laterality Date  ? CHOLECYSTECTOMY    ? COLONOSCOPY WITH PROPOFOL N/A 10/20/2015  ? Procedure: COLONOSCOPY WITH PROPOFOL;  Surgeon: Manya Silvas, MD;  Location: [facility] ENDOSCOPY;  Service: Endoscopy;  Laterality: N/A;  ? COLONOSCOPY WITH PROPOFOL N/A 01/24/2021  ? Procedure: COLONOSCOPY WITH PROPOFOL;  Surgeon: Toledo, Benay Pike, MD;  Location: [facility] ENDOSCOPY;  Service: Gastroenterology;  Laterality: N/A;  ? ESOPHAGOGASTRODUODENOSCOPY (EGD) WITH PROPOFOL N/A 10/20/2015   ? Procedure: ESOPHAGOGASTRODUODENOSCOPY (EGD) WITH PROPOFOL;  Surgeon: Manya Silvas, MD;  Location: [facility] ENDOSCOPY;  Service: Endoscopy;  Laterality: N/A;  ? GREEN LIGHT LASER TURP (TRANSURETHRAL RESECTION OF PROSTATE N/A 11/17/2018  ? Procedure: GREEN LIGHT LASER TURP (TRANSURETHRAL RESECTION OF PROSTATE;  Surgeon: Royston Cowper, MD;  Location: [facility] ORS;  Service: Urology;  Laterality: N/A;  ? INGUINAL HERNIA REPAIR Right 02/18/2019  ? Procedure: HERNIA REPAIR INGUINAL ADULT;  Surgeon: Royston Cowper, MD;  Location: [facility] ORS;  Service: Urology;  Laterality: Right;  ? INSERTION OF MESH Right 02/18/2019  ? Procedure: INSERTION OF MESH;  Surgeon: Royston Cowper, MD;  Location: [facility] ORS;  Service: Urology;  Laterality: Right;  ? TONSILLECTOMY    ? TRANSURETHRAL MICROWAVE THERAPY    ? ? ?Current Medications: ?Current Meds  ?Medication Sig  ? albuterol (VENTOLIN HFA) 108 (90 Base) MCG/ACT inhaler Inhale 2 puffs into the lungs every 6 (six) hours as needed for wheezing or shortness of breath.  ? ALPRAZolam (XANAX) 0.5 MG tablet Take 0.5 mg by mouth at bedtime as needed for anxiety or sleep.  ? budesonide-formoterol (SYMBICORT) 160-4.5 MCG/ACT inhaler Inhale 2 puffs into the lungs 2 (two) times daily.  ? carbidopa-levodopa (SINEMET IR) 25-100 MG tablet Take 2 tablets by mouth 5 (five) times daily.  ? Cyanocobalamin ([employer] VITAMIN B-12) 2000 MCG TBCR Take 2,000 mcg by mouth  daily.  ? doxazosin (CARDURA) 4 MG tablet Take 4 mg by mouth daily.   ? ferrous gluconate (FERGON) 324 MG tablet Take 324 mg by mouth daily with breakfast.  ? metoprolol succinate (TOPROL-XL) 50 MG 24 hr tablet Take 1 tablet (50 mg total) by mouth at bedtime. Take with or immediately following a meal.  ? montelukast (SINGULAIR) 10 MG tablet Take 10 mg by mouth at bedtime.  ? omeprazole (PRILOSEC) 40 MG capsule Take 40 mg by mouth daily.   ? QUEtiapine (SEROQUEL) 50 MG tablet Take 50 mg by mouth at bedtime.  ? rizatriptan (MAXALT) 10 MG tablet Take  10 mg by mouth as needed for migraine. May repeat in 2 hours if needed  ? tiotropium (SPIRIVA) 18 MCG inhalation capsule Place 1 capsule (18 mcg total) into inhaler and inhale daily.  ? [DISCONTINUED] metoprolol succinate (TOPROL-XL) 25 MG 24 hr tablet Take 1 tablet by mouth at bedtime.  ?  ? ?Allergies:   Patient has no known allergies.  ? ?Social History  ? ?Socioeconomic History  ? Marital status: Married  ?  Spouse name: Not on file  ? Number of children: Not on file  ? Years of education: Not on file  ? Highest education level: Not on file  ?Occupational History  ? Not on file  ?Tobacco Use  ? Smoking status: Former  ?  Packs/day: 1.00  ?  Years: 62.00  ?  Pack years: 62.00  ?  Types: Cigarettes  ? Smokeless tobacco: Never  ?Vaping Use  ? Vaping Use: Never used  ?Substance and Sexual Activity  ? Alcohol use: Yes  ?  Comment: occasional  ? Drug use: No  ? Sexual activity: Not on file  ?Other Topics Concern  ? Not on file  ?Social History Narrative  ? Not on file  ? ?Social Determinants of Health  ? ?Financial Resource Strain: Not on file  ?Food Insecurity: Not on file  ?Transportation Needs: Not on file  ?Physical Activity: Not on file  ?Stress: Not on file  ?Social Connections: Not on file  ?  ? ?Family History: ?The patient's family history includes Colon cancer in his mother; Hepatitis in his son. There is no history of Prostate cancer or Kidney cancer. ? ?ROS:   ?Please see the history of present illness.    ?All other systems reviewed and are negative. ? ?EKGs/Labs/Other Studies Reviewed:   ? ?The following studies were reviewed today: ? ?08/01/2021 CT Angio Chest ?FINDINGS: ?Cardiovascular: There is homogeneous enhancement in thoracic aorta. ?There is ectasia of main pulmonary artery measuring 3.7 cm ?suggesting pulmonary arterial hypertension. There are no ?intraluminal filling defects in the pulmonary artery branches. ?Evaluation of small peripheral branches in the lower lung fields is ?limited by patchy  infiltrates. Coronary artery calcifications are ?seen. ? Mediastinum/Nodes: No new significant lymphadenopathy is seen. ? Lungs/Pleura: Centrilobular and panlobular emphysema is seen. There ?are patchy infiltrates in the lower lung fields, more so on the ?right side suggesting atelectasis/pneumonia. Small patchy ?infiltrates are seen in the right upper lobe and left upper lobe. ?There are few scattered blebs and bullae. In the image 90 of series ?6, there is a 5 mm nodule in the right mid lung fields which has not ?changed significantly in comparison with previous studies dating as ?far back as 04/18/2017. Small right pleural effusion is seen. There ?is possible minimal left pleural effusion. There is no pneumothorax. ? Upper Abdomen: Surgical clips are seen in gallbladder fossa. Small ?hiatal hernia  is seen. ? Musculoskeletal: Unremarkable. ?   ?IMPRESSION: ?There is no evidence of pulmonary artery embolism. There is no ?evidence of thoracic aortic dissection. ? There are patchy infiltrates in both lungs, more so in the right ?lower lobe suggesting multifocal pneumonia. Small bilateral pleural ?effusions, more so on the right side. ? COPD. Coronary artery disease. There is ectasia of main pulmonary ?artery suggesting pulmonary arterial hypertension. ?  ? ? ? ?Recent Labs: ?03/09/2021: TSH 1.859 ?06/29/2021: Magnesium 1.8 ?07/22/2021: ALT 10 ?08/01/2021: B Natriuretic Peptide 81.5 ?08/02/2021: BUN 17; Creatinine, Ser 0.67; Potassium 4.4; Sodium 144 ?08/04/2021: Hemoglobin 11.2; Platelets 126  ?Recent Lipid Panel ?No results found for: CHOL, TRIG, HDL, CHOLHDL, VLDL, LDLCALC, LDLDIRECT ? ?Physical Exam:   ? ?VS:  BP 124/84   Pulse 60   Ht '5\' 9"'$  (1.753 m)   Wt 175 lb (79.4 kg)   SpO2 92%   BMI 25.84 kg/m?    ? ?Wt Readings from Last 3 Encounters:  ?09/19/21 175 lb (79.4 kg)  ?08/01/21 177 lb (80.3 kg)  ?08/01/21 177 lb (80.3 kg)  ?  ? ?GEN:  well developed in no acute distress.  Frail.  On supplemental oxygen ?HEENT:  Normal ?NECK: No JVD; No carotid bruits ?LYMPHATICS: No lymphadenopathy ?CARDIAC: RRR, no murmurs, rubs, gallops ?RESPIRATORY:  Clear to auscultation without rales, wheezing or rhonchi  ?ABDOMEN: Soft, non

## 2021-09-19 ENCOUNTER — Ambulatory Visit (INDEPENDENT_AMBULATORY_CARE_PROVIDER_SITE_OTHER): Payer: Medicare Other | Admitting: Cardiology

## 2021-09-19 ENCOUNTER — Encounter: Payer: Self-pay | Admitting: Cardiology

## 2021-09-19 VITALS — BP 124/84 | HR 60 | Ht 69.0 in | Wt 175.0 lb

## 2021-09-19 DIAGNOSIS — I10 Essential (primary) hypertension: Secondary | ICD-10-CM

## 2021-09-19 DIAGNOSIS — I480 Paroxysmal atrial fibrillation: Secondary | ICD-10-CM

## 2021-09-19 DIAGNOSIS — R509 Fever, unspecified: Secondary | ICD-10-CM | POA: Diagnosis not present

## 2021-09-19 MED ORDER — METOPROLOL SUCCINATE ER 50 MG PO TB24: 50.0000 mg | ORAL_TABLET | Freq: Every day | ORAL | 3 refills | Status: DC

## 2021-09-19 NOTE — Patient Instructions (Addendum)
Medications: ?Increase metoprolol succinate to 50 mg daily ?Your physician recommends that you continue on your current medications as directed. Please refer to the Current Medication list given to you today. ?*If you need a refill on your cardiac medications before your next appointment, please call your pharmacy* ? ?Lab Work: ?None. ?If you have labs (blood work) drawn today and your tests are completely normal, you will receive your results only by: ?MyChart Message (if you have MyChart) OR ?A paper copy in the mail ?If you have any lab test that is abnormal or we need to change your treatment, we will call you to review the results. ? ?Testing/Procedures: ?None. ? ?Follow-Up: ?At [facility], you and your health needs are our priority.  As part of our continuing mission to provide you with exceptional heart care, we have created designated Provider Care Teams.  These Care Teams include your primary Cardiologist (physician) and Advanced Practice Providers (APPs -  Physician Assistants and Nurse Practitioners) who all work together to provide you with the care you need, when you need it. ? ?Your physician wants you to follow-up in: Dr. Donnelly Montoya with the [facility].  ? ?We recommend signing up for the patient portal called "MyChart".  Sign up information is provided on this After Visit Summary.  MyChart is used to connect with patients for Virtual Visits (Telemedicine).  Patients are able to view lab/test results, encounter notes, upcoming appointments, etc.  Non-urgent messages can be sent to your provider as well.   ?To learn more about what you can do with MyChart, go to [email or web address].   ? ?Any Other Special Instructions Will Be Listed Below (If Applicable). ? ?

## 2021-10-10 ENCOUNTER — Ambulatory Visit: Payer: Medicare Other | Admitting: Cardiology

## 2021-10-16 ENCOUNTER — Encounter: Payer: Self-pay | Admitting: Dermatology

## 2021-10-30 ENCOUNTER — Telehealth: Payer: Self-pay | Admitting: Nurse Practitioner

## 2021-10-30 NOTE — Telephone Encounter (Signed)
Spoke with patient regarding the Palliative referral and he was in agreement with beginning services with [place].  I have scheduled an In-home Consult for 11/15/21 @ 10:30 AM.

## 2021-11-15 ENCOUNTER — Encounter: Payer: Self-pay | Admitting: Nurse Practitioner

## 2021-11-15 ENCOUNTER — Other Ambulatory Visit: Payer: Medicare Other | Admitting: Nurse Practitioner

## 2021-11-15 DIAGNOSIS — Z515 Encounter for palliative care: Secondary | ICD-10-CM

## 2021-11-15 DIAGNOSIS — G20 Parkinson's disease: Secondary | ICD-10-CM

## 2021-11-15 DIAGNOSIS — R0602 Shortness of breath: Secondary | ICD-10-CM

## 2021-11-15 DIAGNOSIS — R5381 Other malaise: Secondary | ICD-10-CM

## 2021-11-15 DIAGNOSIS — J449 Chronic obstructive pulmonary disease, unspecified: Secondary | ICD-10-CM

## 2021-11-15 NOTE — Progress Notes (Signed)
[facility] Consult Note Telephone: [phone]  Fax: [phone]   Date of encounter: 11/15/21 2:01 PM PATIENT NAME: Patrick Montoya [address] [place] [address]   [phone] (home)  DOB: 1941/01/24 MRN: [id number] PRIMARY CARE PROVIDER:    Tracie Harrier, MD,  [address] [place] [address] [phone] RESPONSIBLE PARTY:    Contact Information     Name Relation Home Work Mobile   Burnham Spouse   [phone]   Anthonyjames, Bargar   [phone]   Mosley,JJackie Granddaughter   [phone]      I met face to face with patient and family in home. Palliative Care was asked to follow this patient by consultation request of  Tracie Harrier, MD to address advance care planning and complex medical decision making. This is the initial visit.                                ASSESSMENT AND PLAN / RECOMMENDATIONS:  Advance Care Planning/Goals of Care: Goals include to maximize quality of life and symptom management. Patient/health care surrogate gave his/her permission to discuss.Our advance care planning conversation included a discussion about:    The value and importance of advance care planning  Experiences with loved ones who have been seriously ill or have died  Exploration of personal, cultural or spiritual beliefs that might influence medical decisions  Exploration of goals of care in the event of a sudden injury or illness  Identification  of a healthcare agent  Review and updating or creation of an  advance directive document . Decision not to resuscitate or to de-escalate disease focused treatments due to poor prognosis. CODE STATUS: Full code  Symptom Management/Plan: 1. Advance Care Planning;  Full code with ongoing discussions of full code vs DNR; introduced MOST form, wishes are to continue to review with family. Will revisit at next [facility] f/u visit  2. Goals of  Care: Goals include to maximize quality of life and symptom management. Our advance care planning conversation included a discussion about:    The value and importance of advance care planning  Exploration of personal, cultural or spiritual beliefs that might influence medical decisions  Exploration of goals of care in the event of a sudden injury or illness  Identification and preparation of a healthcare agent  Review and updating or creation of an advance directive document.  3. Debility secondary to Parkinson disease discussed at length disease progression, functional limitations. Continue with therapy to improve weakness, balance, self independence. Monitor disease progression. Supportive role continue with PC.  4. Shortness of breath secondary to COPD/supplemental O2, discussed to continue inhalation therapy, monitor respiratory status. We talked about importance of resting. Continue OP Pulmonary appointments  5. Palliative care encounter; Palliative care encounter; Palliative medicine team will continue to support patient, patient's family, and medical team. Visit consisted of counseling and education dealing with the complex and emotionally intense issues of symptom management and palliative care in the setting of serious and potentially life-threatening illness  Follow up Palliative Care Visit: Palliative care will continue to follow for complex medical decision making, advance care planning, and clarification of goals. Return 4 weeks or prn.  I spent 66 minutes providing this consultation. More than 50% of the time in this consultation was spent in counseling and care coordination. PPS: 50% Chief Complaint: Initial palliative consult for complex medical decision making,  address goals, manage ongoing symptoms  HISTORY OF PRESENT ILLNESS:  [name] is a 81 y.o. year old male  with multiple medical problems including Parkinson disease, COPD _0 /[place], afib, BPH, gastric ulcer disease,  HBV, GERD, anxiety, depression. Hospitalized 08/01/2021 to 3//25/2023 for fever, sob with workup significant for COPD exacerbation with sepsis secondary to multifocal PNA, acute on chronic respiratory failure with hypoxia. Stabilized and d/c home with home health, therapy. I visited Mr Safranek in his home with his wife, and son by conference call, Tim. We talked about purpose PC visit, reviewed past medical history, social/family hx, recent hospitalization. We talked about chronic disease with progression of parkinson, copd with O2 dependence. We talked about ros, symptoms including debility, shortness of breath, pain, appetite, nutrition education, discussed functional ability. We talked about Mr. Madlock no longer driving. We talked about his functional abilities have declined as he is relying more on family to help. We talked about currently receiving therapy, doing well, improving weakness. We talked about Mr. Harps is able to perform ADL's. We talked about quality of life. We talked about medical goals including code status, introduced MOST form. Blank copy for Mr. Wollard to further review with family, will revisit next visit. We talked about role pc in poc. We talked about f/u pc visit, scheduled. Therapeutic listening and emotional support provided. Questions answered.   History obtained from review of EMR, discussion with Mrs and Mr. Marchuk in person with son [name] by conference call I reviewed available labs, medications, imaging, studies and related documents from the EMR.  Records reviewed and summarized above.   ROS 10 point system reviewed all negative except HPI  Physical Exam: Constitutional: NAD General: frail appearing, pleasant male EYES: lids intact ENMT:oral mucous membranes moist CV: S1S2, RRR Pulmonary: clear, decrease bases, no increased work of breathing, no cough Abdomen: soft and non tender MSK: ambulatory Skin: warm and dry Neuro:  + generalized weakness,  no cognitive  impairment Psych: non-anxious affect, A and O x 3 CURRENT PROBLEM LIST:  Patient Active Problem List   Diagnosis Date Noted   Multifocal pneumonia 08/01/2021   HTN (hypertension) 08/01/2021   Sepsis ([place]) 08/01/2021   BPH (benign prostatic hyperplasia) 08/01/2021   COPD exacerbation ([place]) 07/22/2021   Acute on chronic respiratory failure with hypoxia (HCC) 07/22/2021   Paroxysmal atrial fibrillation ([place]) 07/22/2021   Essential hypertension 07/22/2021   History of hepatitis D 07/22/2021   Chronic respiratory failure with hypoxia ([place]) 06/29/2021   History of gastric ulcer 06/29/2021   Paroxysmal atrial fibrillation with rapid ventricular response ([place]) 06/29/2021   Rapid atrial fibrillation ([place]) 06/29/2021   Long term (current) use of anticoagulants 06/20/2021   Chest pain    Acute respiratory failure with hypoxia ([place]) 02/26/2021   Sepsis due to undetermined organism ([place]) 02/26/2021   Bacterial pneumonia 02/26/2021   Parkinson's disease ([place]) 03/26/2019   Sternal fracture with retrosternal contusion, closed, initial encounter 10/31/2017   Chronic bronchitis ([place]) 05/28/2015   Gastro-esophageal reflux disease without esophagitis 05/26/2015   Insomnia, 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   COPD with acute exacerbation ([place]) 04/11/2014   Benign prostatic hypertrophy without urinary obstruction 01/21/2014   Acute bronchitis with chronic obstructive pulmonary disease (COPD) ([place]) 01/21/2014   Accelerated hypertension 01/21/2014   SINUSITIS- ACUTE-NOS 07/03/2007   RENAL CALCULUS 06/05/2007   HYPERTENSION 02/27/2007   COPD (chronic obstructive pulmonary disease) ([place]) 02/27/2007   GERD 02/27/2007   PEPTIC ULCER DISEASE 02/27/2007   BENIGN PROSTATIC HYPERTROPHY 02/27/2007   BURSITIS, RIGHT  SHOULDER 02/27/2007   COLONIC POLYPS, HX OF 02/27/2007   FROZEN RIGHT SHOULDER 12/05/2006   PAST MEDICAL HISTORY:  Active Ambulatory Problems    Diagnosis Date Noted   HYPERTENSION 02/27/2007    SINUSITIS- ACUTE-NOS 07/03/2007   COPD (chronic obstructive pulmonary disease) ([place]) 02/27/2007   GERD 02/27/2007   PEPTIC ULCER DISEASE 02/27/2007   RENAL CALCULUS 06/05/2007   BENIGN PROSTATIC HYPERTROPHY 02/27/2007   FROZEN RIGHT SHOULDER 12/05/2006   BURSITIS, RIGHT SHOULDER 02/27/2007   COLONIC POLYPS, HX OF 02/27/2007   Benign prostatic hypertrophy without urinary obstruction 01/21/2014   Chronic bronchitis ([place]) 05/28/2015   Insomnia, 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   COPD with acute exacerbation ([place]) 04/11/2014   Acute bronchitis with chronic obstructive pulmonary disease (COPD) ([place]) 01/21/2014   Accelerated hypertension 01/21/2014   Gastro-esophageal reflux disease without esophagitis 05/26/2015   Sternal fracture with retrosternal contusion, closed, initial encounter 10/31/2017   Acute respiratory failure with hypoxia ([place]) 02/26/2021   Sepsis due to undetermined organism ([place]) 02/26/2021   Bacterial pneumonia 02/26/2021   Parkinson's disease ([place]) 03/26/2019   Chest pain    Chronic respiratory failure with hypoxia ([place]) 06/29/2021   Long term (current) use of anticoagulants 06/20/2021   History of gastric ulcer 06/29/2021   Paroxysmal atrial fibrillation with rapid ventricular response ([place]) 06/29/2021   Rapid atrial fibrillation ([place]) 06/29/2021   COPD exacerbation ([place]) 07/22/2021   Acute on chronic respiratory failure with hypoxia (HCC) 07/22/2021   Paroxysmal atrial fibrillation ([place]) 07/22/2021   Essential hypertension 07/22/2021   History of hepatitis D 07/22/2021   Multifocal pneumonia 08/01/2021   HTN (hypertension) 08/01/2021   Sepsis ([place]) 08/01/2021   BPH (benign prostatic hyperplasia) 08/01/2021   Resolved Ambulatory Problems    Diagnosis Date Noted   No Resolved Ambulatory Problems   Past Medical History:  Diagnosis Date   Basal cell carcinoma 07/25/2015   Basal cell carcinoma 06/11/2018   Basal cell carcinoma 10/04/2019   Elevated liver enzymes     Emphysema of lung (HCC)    Gastric ulcer    GERD (gastroesophageal reflux disease)    Hepatitis B    Hypertension    Prostate hypertrophy    Rotator cuff tear right   UTI (urinary tract infection)    SOCIAL HX:  Social History   Tobacco Use   Smoking status: Former    Packs/day: 1.00    Years: 62.00    Total pack years: 62.00    Types: Cigarettes   Smokeless tobacco: Never  Substance Use Topics   Alcohol use: Yes    Comment: occasional   FAMILY HX:  Family History  Problem Relation Age of Onset   Colon cancer Mother    Hepatitis Son    Prostate cancer Neg Hx    Kidney cancer Neg Hx       ALLERGIES: No Known Allergies   PERTINENT MEDICATIONS:  Outpatient Encounter Medications as of 11/15/2021  Medication Sig   albuterol (VENTOLIN HFA) 108 (90 Base) MCG/ACT inhaler Inhale 2 puffs into the lungs every 6 (six) hours as needed for wheezing or shortness of breath.   ALPRAZolam (XANAX) 0.5 MG tablet Take 0.5 mg by mouth at bedtime as needed for anxiety or sleep.   budesonide-formoterol (SYMBICORT) 160-4.5 MCG/ACT inhaler Inhale 2 puffs into the lungs 2 (two) times daily.   carbidopa-levodopa (SINEMET IR) 25-100 MG tablet Take 2 tablets by mouth 5 (five) times daily.   Cyanocobalamin ([employer] VITAMIN B-12) 2000 MCG TBCR Take 2,000 mcg by mouth daily.  doxazosin (CARDURA) 4 MG tablet Take 4 mg by mouth daily.    ferrous gluconate (FERGON) 324 MG tablet Take 324 mg by mouth daily with breakfast.   metoprolol succinate (TOPROL-XL) 50 MG 24 hr tablet Take 1 tablet (50 mg total) by mouth at bedtime. Take with or immediately following a meal.   montelukast (SINGULAIR) 10 MG tablet Take 10 mg by mouth at bedtime.   omeprazole (PRILOSEC) 40 MG capsule Take 40 mg by mouth daily.    QUEtiapine (SEROQUEL) 50 MG tablet Take 50 mg by mouth at bedtime.   rizatriptan (MAXALT) 10 MG tablet Take 10 mg by mouth as needed for migraine. May repeat in 2 hours if needed   tiotropium (SPIRIVA) 18 MCG  inhalation capsule Place 1 capsule (18 mcg total) into inhaler and inhale daily.   No facility-administered encounter medications on file as of 11/15/2021.   Thank you for the opportunity to participate in the care of Mr. Walz.  The palliative care team will continue to follow. Please call our office at [phone] if we can be of additional assistance.   Nawaal Alling Z Haidyn Kilburg, NP ,

## 2021-12-05 ENCOUNTER — Telehealth: Payer: Self-pay | Admitting: Nurse Practitioner

## 2021-12-05 NOTE — Telephone Encounter (Signed)
I received a message about pc consult, I called Mr. Mclear, explained pc, we talked about last visit and upcoming visit. Mr. Klemann in agreement.

## 2021-12-10 ENCOUNTER — Ambulatory Visit (INDEPENDENT_AMBULATORY_CARE_PROVIDER_SITE_OTHER): Payer: Medicare Other | Admitting: Dermatology

## 2021-12-10 ENCOUNTER — Encounter: Payer: Self-pay | Admitting: Dermatology

## 2021-12-10 DIAGNOSIS — L821 Other seborrheic keratosis: Secondary | ICD-10-CM

## 2021-12-10 DIAGNOSIS — Z1283 Encounter for screening for malignant neoplasm of skin: Secondary | ICD-10-CM

## 2021-12-10 DIAGNOSIS — D692 Other nonthrombocytopenic purpura: Secondary | ICD-10-CM | POA: Diagnosis not present

## 2021-12-10 DIAGNOSIS — L570 Actinic keratosis: Secondary | ICD-10-CM

## 2021-12-10 DIAGNOSIS — D229 Melanocytic nevi, unspecified: Secondary | ICD-10-CM

## 2021-12-10 DIAGNOSIS — L578 Other skin changes due to chronic exposure to nonionizing radiation: Secondary | ICD-10-CM

## 2021-12-10 DIAGNOSIS — Z85828 Personal history of other malignant neoplasm of skin: Secondary | ICD-10-CM

## 2021-12-10 DIAGNOSIS — L814 Other melanin hyperpigmentation: Secondary | ICD-10-CM

## 2021-12-10 DIAGNOSIS — D1800 Hemangioma unspecified site: Secondary | ICD-10-CM

## 2021-12-10 NOTE — Progress Notes (Signed)
New Patient Visit  Subjective  Patrick Montoya is a 81 y.o. male who presents for the following: New Patient (Initial Visit) (Tbse. Hx of bcc, hx of aks. Patient reports recently started on blood thinners and would like to discuss bruising at arms. ). The patient presents for Total-Body Skin Exam (TBSE) for skin cancer screening and mole check.  The patient has spots, moles and lesions to be evaluated, some may be new or changing and the patient has concerns that these could be cancer.  The following portions of the chart were reviewed this encounter and updated as appropriate:   Tobacco  Allergies  Meds  Problems  Med Hx  Surg Hx  Fam Hx     Review of Systems:  No other skin or systemic complaints except as noted in HPI or Assessment and Plan.  Objective  Well appearing patient in no apparent distress; mood and affect are within normal limits.  A full examination was performed including scalp, head, eyes, ears, nose, lips, neck, chest, axillae, abdomen, back, buttocks, bilateral upper extremities, bilateral lower extremities, hands, feet, fingers, toes, fingernails, and toenails. All findings within normal limits unless otherwise noted below.  nose x 1 Erythematous thin papules/macules with gritty scale.    Assessment & Plan  Actinic keratosis nose x 1  Actinic keratoses are precancerous spots that appear secondary to cumulative UV radiation exposure/sun exposure over time. They are chronic with expected duration over 1 year. A portion of actinic keratoses will progress to squamous cell carcinoma of the skin. It is not possible to reliably predict which spots will progress to skin cancer and so treatment is recommended to prevent development of skin cancer.  Recommend daily broad spectrum sunscreen SPF 30+ to sun-exposed areas, reapply every 2 hours as needed.  Recommend staying in the shade or wearing long sleeves, sun glasses (UVA+UVB protection) and wide brim hats (4-inch  brim around the entire circumference of the hat). Call for new or changing lesions.  Destruction of lesion - nose x 1 Complexity: simple   Destruction method: cryotherapy   Informed consent: discussed and consent obtained   Timeout:  patient name, date of birth, surgical site, and procedure verified Lesion destroyed using liquid nitrogen: Yes   Region frozen until ice ball extended beyond lesion: Yes   Outcome: patient tolerated procedure well with no complications   Post-procedure details: wound care instructions given   Additional details:  Prior to procedure, discussed risks of blister formation, small wound, skin dyspigmentation, or rare scar following cryotherapy. Recommend Vaseline ointment to treated areas while healing.   Skin cancer screening  Actinic skin damage  Seborrheic keratosis  Lentigines - Scattered tan macules - Due to sun exposure - Benign-appearing, observe - Recommend daily broad spectrum sunscreen SPF 30+ to sun-exposed areas, reapply every 2 hours as needed. - Call for any changes  Seborrheic Keratoses - Stuck-on, waxy, tan-brown papules and/or plaques  - Benign-appearing - Discussed benign etiology and prognosis. - Observe - Call for any changes  Melanocytic Nevi - Tan-brown and/or pink-flesh-colored symmetric macules and papules - Benign appearing on exam today - Observation - Call clinic for new or changing moles - Recommend daily use of broad spectrum spf 30+ sunscreen to sun-exposed areas.   Venous lake on left ear helix Dilated blood vessel Benign-appearing.  Observation.  Call clinic for new or changing lesions.  Recommend daily use of broad spectrum spf 30+ sunscreen to sun-exposed areas.   Hemangiomas - Red papules - Discussed  benign nature - Observe - Call for any changes  Purpura - Chronic; persistent and recurrent.  Treatable, but not curable. At bilateral arm  - Violaceous macules and patches - Benign - Related to trauma,  age, sun damage and/or use of blood thinners, chronic use of topical and/or oral steroids - Observe - Can use OTC arnica containing moisturizer such as Dermend Bruise Formula if desired - Call for worsening or other concerns  Actinic Damage - Chronic condition, secondary to cumulative UV/sun exposure - diffuse scaly erythematous macules with underlying dyspigmentation - Recommend daily broad spectrum sunscreen SPF 30+ to sun-exposed areas, reapply every 2 hours as needed.  - Staying in the shade or wearing long sleeves, sun glasses (UVA+UVB protection) and wide brim hats (4-inch brim around the entire circumference of the hat) are also recommended for sun protection.  - Call for new or changing lesions.  History of Basal Cell Carcinoma of the Skin Left nose, left nasal ala, left zygoma  - No evidence of recurrence today - Recommend regular full body skin exams - Recommend daily broad spectrum sunscreen SPF 30+ to sun-exposed areas, reapply every 2 hours as needed.  - Call if any new or changing lesions are noted between office visits  Skin cancer screening performed today. Return in about 1 year (around 12/11/2022) for TBSE. IRuthell Rummage, CMA, am acting as scribe for Sarina Ser, MD. Documentation: I have reviewed the above documentation for accuracy and completeness, and I agree with the above.  Sarina Ser, MD

## 2021-12-10 NOTE — Patient Instructions (Addendum)
For bruising at arms   Can use OTC arnica containing moisturizer such as Dermend Bruise Formula if desired  Actinic keratoses are precancerous spots that appear secondary to cumulative UV radiation exposure/sun exposure over time. They are chronic with expected duration over 1 year. A portion of actinic keratoses will progress to squamous cell carcinoma of the skin. It is not possible to reliably predict which spots will progress to skin cancer and so treatment is recommended to prevent development of skin cancer.  Recommend daily broad spectrum sunscreen SPF 30+ to sun-exposed areas, reapply every 2 hours as needed.  Recommend staying in the shade or wearing long sleeves, sun glasses (UVA+UVB protection) and wide brim hats (4-inch brim around the entire circumference of the hat). Call for new or changing lesions.    Seborrheic Keratosis  What causes seborrheic keratoses? Seborrheic keratoses are harmless, common skin growths that first appear during adult life.  As time goes by, more growths appear.  Some people may develop a large number of them.  Seborrheic keratoses appear on both covered and uncovered body parts.  They are not caused by sunlight.  The tendency to develop seborrheic keratoses can be inherited.  They vary in color from skin-colored to gray, brown, or even black.  They can be either smooth or have a rough, warty surface.   Seborrheic keratoses are superficial and look as if they were stuck on the skin.  Under the microscope this type of keratosis looks like layers upon layers of skin.  That is why at times the top layer may seem to fall off, but the rest of the growth remains and re-grows.    Treatment Seborrheic keratoses do not need to be treated, but can easily be removed in the office.  Seborrheic keratoses often cause symptoms when they rub on clothing or jewelry.  Lesions can be in the way of shaving.  If they become inflamed, they can cause itching, soreness, or burning.   Removal of a seborrheic keratosis can be accomplished by freezing, burning, or surgery. If any spot bleeds, scabs, or grows rapidly, please return to have it checked, as these can be an indication of a skin cancer.  Cryotherapy Aftercare  Wash gently with soap and water everyday.   Apply Vaseline and Band-Aid daily until healed.     Melanoma ABCDEs  Melanoma is the most dangerous type of skin cancer, and is the leading cause of death from skin disease.  You are more likely to develop melanoma if you: Have light-colored skin, light-colored eyes, or red or blond hair Spend a lot of time in the sun Tan regularly, either outdoors or in a tanning bed Have had blistering sunburns, especially during childhood Have a close family member who has had a melanoma Have atypical moles or large birthmarks  Early detection of melanoma is key since treatment is typically straightforward and cure rates are extremely high if we catch it early.   The first sign of melanoma is often a change in a mole or a new dark spot.  The ABCDE system is a way of remembering the signs of melanoma.  A for asymmetry:  The two halves do not match. B for border:  The edges of the growth are irregular. C for color:  A mixture of colors are present instead of an even brown color. D for diameter:  Melanomas are usually (but not always) greater than 66m - the size of a pencil eraser. E for evolution:  The spot  keeps changing in size, shape, and color.  Please check your skin once per month between visits. You can use a small mirror in front and a large mirror behind you to keep an eye on the back side or your body.   If you see any new or changing lesions before your next follow-up, please call to schedule a visit.  Please continue daily skin protection including broad spectrum sunscreen SPF 30+ to sun-exposed areas, reapplying every 2 hours as needed when you're outdoors.   Staying in the shade or wearing long sleeves,  sun glasses (UVA+UVB protection) and wide brim hats (4-inch brim around the entire circumference of the hat) are also recommended for sun protection.    Due to recent changes in healthcare laws, you may see results of your pathology and/or laboratory studies on MyChart before the doctors have had a chance to review them. We understand that in some cases there may be results that are confusing or concerning to you. Please understand that not all results are received at the same time and often the doctors may need to interpret multiple results in order to provide you with the best plan of care or course of treatment. Therefore, we ask that you please give [place] 2 business days to thoroughly review all your results before contacting the office for clarification. Should we see a critical lab result, you will be contacted sooner.   If You Need Anything After Your Visit  If you have any questions or concerns for your doctor, please call our main line at [phone] and press option 4 to reach your doctor's medical assistant. If no one answers, please leave a voicemail as directed and we will return your call as soon as possible. Messages left after 4 pm will be answered the following business day.   You may also send [place] a message via [place]. We typically respond to MyChart messages within 1-2 business days.  For prescription refills, please ask your pharmacy to contact our office. Our fax number is [phone].  If you have an urgent issue when the clinic is closed that cannot wait until the next business day, you can page your doctor at the number below.    Please note that while we do our best to be available for urgent issues outside of office hours, we are not available 24/7.   If you have an urgent issue and are unable to reach [place], you may choose to seek medical care at your doctor's office, retail clinic, urgent care center, or emergency room.  If you have a medical emergency, please immediately  call 911 or go to the emergency department.  Pager Numbers  - Dr. Nehemiah Massed: [phone]  - Dr. Laurence Ferrari: [phone]  - Dr. Nicole Kindred: [phone]  In the event of inclement weather, please call our main line at [phone] for an update on the status of any delays or closures.  Dermatology Medication Tips: Please keep the boxes that topical medications come in in order to help keep track of the instructions about where and how to use these. Pharmacies typically print the medication instructions only on the boxes and not directly on the medication tubes.   If your medication is too expensive, please contact our office at [phone] option 4 or send [place] a message through [place].   We are unable to tell what your co-pay for medications will be in advance as this is different depending on your insurance coverage. However, we may be able to find a substitute  medication at lower cost or fill out paperwork to get insurance to cover a needed medication.   If a prior authorization is required to get your medication covered by your insurance company, please allow [place] 1-2 business days to complete this process.  Drug prices often vary depending on where the prescription is filled and some pharmacies may offer cheaper prices.  The website www.goodrx.com contains coupons for medications through different pharmacies. The prices here do not account for what the cost may be with help from insurance (it may be cheaper with your insurance), but the website can give you the price if you did not use any insurance.  - You can print the associated coupon and take it with your prescription to the pharmacy.  - You may also stop by our office during regular business hours and pick up a GoodRx coupon card.  - If you need your prescription sent electronically to a different pharmacy, notify our office through [facility] or by phone at [phone] option 4.     Si Usted Necesita Algo Despus de Su  Visita  Tambin puede enviarnos un mensaje a travs de Pharmacist, community. Por lo general respondemos a los mensajes de MyChart en el transcurso de 1 a 2 das hbiles.  Para renovar recetas, por favor pida a su farmacia que se ponga en contacto con nuestra oficina. Harland Dingwall de fax es [place] [phone].  Si tiene un asunto urgente cuando la clnica est cerrada y que no puede esperar hasta el siguiente da hbil, puede llamar/localizar a su doctor(a) al nmero que aparece a continuacin.   Por favor, tenga en cuenta que aunque hacemos todo lo posible para estar disponibles para asuntos urgentes fuera del horario de [place], no estamos disponibles las 24 horas del da, los 7 das de la [place].   Si tiene un problema urgente y no puede comunicarse con nosotros, puede optar por buscar atencin mdica  en el consultorio de su doctor(a), en una clnica privada, en un centro de atencin urgente o en una sala de emergencias.  Si tiene Engineering geologist, por favor llame inmediatamente al 911 o vaya a la sala de emergencias.  Nmeros de bper  - Dr. Nehemiah Massed: [phone]  - Dra. Moye: [phone]  - Dra. Nicole Kindred: [phone]  En caso de inclemencias del [place], por favor llame a Johnsie Kindred principal al [phone] para una actualizacin sobre el [place] de cualquier retraso o cierre.  Consejos para la medicacin en dermatologa: Por favor, guarde las cajas en las que vienen los medicamentos de uso tpico para ayudarle a seguir las instrucciones sobre dnde y cmo usarlos. Las farmacias generalmente imprimen las instrucciones del medicamento slo en las cajas y no directamente en los tubos del [place].   Si su medicamento es muy caro, por favor, pngase en contacto con Zigmund Daniel llamando al [phone] y presione la opcin 4 o envenos un mensaje a travs de Pharmacist, community.   No podemos decirle cul ser su copago por los medicamentos por adelantado ya que esto es diferente dependiendo de la  cobertura de su seguro. Sin embargo, es posible que podamos encontrar un medicamento sustituto a Electrical engineer un formulario para que el seguro cubra el medicamento que se considera necesario.   Si se requiere una autorizacin previa para que su compaa de seguros [place] su medicamento, por favor permtanos de 1 a 2 das hbiles para completar este proceso.  Los precios de los medicamentos varan con frecuencia dependiendo del Environmental consultant de  dnde se surte la receta y alguna farmacias pueden ofrecer precios ms baratos.  El sitio web www.goodrx.com tiene cupones para medicamentos de Airline pilot. Los precios aqu no tienen en cuenta lo que podra costar con la ayuda del seguro (puede ser ms barato con su seguro), pero el sitio web puede darle el precio si no utiliz Research scientist (physical sciences).  - Puede imprimir el cupn correspondiente y llevarlo con su receta a la farmacia.  - Tambin puede pasar por nuestra oficina durante el horario de atencin regular y Charity fundraiser una tarjeta de cupones de GoodRx.  - Si necesita que su receta se enve electrnicamente a una farmacia diferente, informe a nuestra oficina a travs de MyChart de [place] o por telfono llamando al [phone] y presione la opcin 4.

## 2021-12-18 ENCOUNTER — Telehealth: Payer: Self-pay

## 2021-12-18 NOTE — Telephone Encounter (Signed)
225 pm. Message received from patient regarding possible "a-fib".  Return call made to patient.  Mr. Rumble shares he was washing dishes and felt dizziness/sweating/pressure to his chest.  He went to sit down and checked his apple watch and noted heart rate readings of 115-125.  Symptoms are now resolved and HR is 77.   Patient shares past experiences with a-fib and ED visits.  We discussed ED visit as recommended by Christin Gusler, NP today but patient did not feel this was necessary since symptoms have resolved.  We discussed recent cardiology visit and outcome.  Patient encouraged to follow up with cardiology.

## 2021-12-19 ENCOUNTER — Encounter: Payer: Self-pay | Admitting: Nurse Practitioner

## 2021-12-19 ENCOUNTER — Other Ambulatory Visit: Payer: Medicare Other | Admitting: Nurse Practitioner

## 2021-12-19 DIAGNOSIS — J449 Chronic obstructive pulmonary disease, unspecified: Secondary | ICD-10-CM

## 2021-12-19 DIAGNOSIS — Z515 Encounter for palliative care: Secondary | ICD-10-CM

## 2021-12-19 DIAGNOSIS — R5381 Other malaise: Secondary | ICD-10-CM

## 2021-12-19 DIAGNOSIS — R0602 Shortness of breath: Secondary | ICD-10-CM

## 2021-12-19 DIAGNOSIS — G20 Parkinson's disease: Secondary | ICD-10-CM

## 2021-12-19 NOTE — Progress Notes (Signed)
[facility] [place] Consult Note Telephone: [phone]  Fax: [phone]    Date of encounter: 12/19/21 6:44 PM PATIENT NAME: Patrick Montoya [address] [place] [address]   [phone] (Montoya)  DOB: 22-Feb-1941 MRN: [id number] PRIMARY CARE PROVIDER:    Tracie Harrier, MD,  [address] [place] [address] [phone]  RESPONSIBLE PARTY:    Contact Information     Name Relation Montoya Work Mobile   Patrick Montoya Spouse   [phone]   Patrick Montoya, Patrick Montoya   [phone]   Patrick Montoya Granddaughter   [phone]     Patrick Montoya. Palliative Care was asked to follow this patient by consultation request of  Patrick Harrier, MD to address advance care planning and complex medical decision making. This is the initial visit.                                ASSESSMENT AND PLAN / RECOMMENDATIONS:  Advance Care Planning/Goals of Care: Goals include to maximize quality of life and symptom management. Patient/health care surrogate gave his/her permission to discuss.Our advance care planning conversation included a discussion about:    The value and importance of advance care planning  Experiences with loved ones who have been seriously ill or have died  Exploration of personal, cultural or spiritual beliefs that might influence medical decisions  Exploration of goals of care in the event of a sudden injury or illness  Identification  of a healthcare agent  Review and updating or creation of an  advance directive document . Decision not to resuscitate or to de-escalate disease focused treatments due to poor prognosis. CODE STATUS: Full code   Symptom Management/Plan: 1. Advance Care Planning;  Full code with ongoing discussions of full code vs DNR; introduced MOST form, wishes are to continue to review with family. Will revisit at next Patrick Montoya f/u visit   2. Debility  secondary to Parkinson disease discussed at length disease progression, functional limitations. Continue with therapy to improve weakness, balance, self independence. Monitor disease progression. Supportive role continue with PC.   3. Shortness of breath discussed concern for PNA with clinical presentation with COPD/supplemental O2, discussed to continue inhalation therapy, monitor respiratory status. We talked about importance of resting. Continue OP Pulmonary appointments. Called Patrick Montoya office appointment for today for further evaluation. Discussed with Patrick Montoya with episode last night of tachycardia with possible afib, dizziness and now crackles, recent PNA complicated similar presentation would benefit from EKG, CxR prior to treating to see if PNA. Patrick Montoya in agreement to appointment.    4. Palliative care encounter; Palliative care encounter; Palliative medicine team will continue to support patient, patient's family, and medical team. Visit consisted of counseling and education dealing with the complex and emotionally intense issues of symptom management and palliative care in the setting of serious and potentially life-threatening illness   Follow up Palliative Care Visit: Palliative care will continue to follow for complex medical decision making, advance care planning, and clarification of goals. Return 2 weeks or prn.   Patrick spent 61 minutes providing this consultation. More than 50% of the time in this consultation was spent in counseling and care coordination. PPS: 50% Chief Complaint: Initial palliative consult for complex medical decision making, address goals, manage ongoing symptoms   HISTORY OF PRESENT ILLNESS:  Patrick Montoya is a 81  y.o. year old male  with multiple medical problems including Parkinson disease, COPD _0 /[place], afib, BPH, gastric ulcer disease, HBV, GERD, anxiety, depression. Hospitalized 08/01/2021 to 3//25/2023 for fever, sob with workup significant for COPD  exacerbation with sepsis secondary to multifocal PNA, acute on chronic respiratory failure with hypoxia. Stabilized and d/c [place] with [facility], therapy. Patrick visited Patrick Montoya in his [place] with his wife. We talked about how Patrick Montoya has been feeling. Patrick Montoya endorses he had an episode last night, heart rate went to to 100's, irregular, felt like afib including dizziness when standing at the sink making dinner. Patrick. Montoya endorses it improved after about 15 minutes. Patrick. Montoya endorses the last several times he had episodes of afib he was hospitalized with PNA's. We talked about shortness of breath, cough. We talked about appetite declined. We talked about clinical presentation with concern for PNA. Discussed with recurrent PNA best plan is schedule visit with Patrick Montoya for cxr, ekg prior to starting abx due to recent multiple abx use. Patrick. Montoya in agreement, called Patrick Montoya office, was able to schedule visit for today. We talked about functional ability, recent fall. We talked about fall precautions. Discussed medical goals, role pc in poc. Therapeutic listening, emotional support provided. Patrick Montoya was concerned about being left alone if Patrick Montoya is admitted, notified Hospice as she is under Hospice services per Patrick Montoya request.  We talked about role pc in 19. We talked about f/u pc visit, scheduled. Therapeutic listening and emotional support provided. Questions answered.    History obtained from review of EMR, discussion with Patrick and Patrick. [place] in person with Patrick Montoya by conference call Patrick reviewed available labs, medications, imaging, studies and related documents from the EMR.  Records reviewed and summarized above.    ROS 10 point system reviewed all negative except HPI   Physical Exam: Constitutional: NAD General: frail appearing, pleasant male EYES: lids intact ENMT:oral mucous membranes moist CV: S1S2, RRR Pulmonary: crackles bases Abdomen: soft and non tender MSK:  ambulatory Skin: warm and dry Neuro:  + generalized weakness,  no cognitive impairment Psych: non-anxious affect, A and O x 3  Thank you for the opportunity to participate in the care of Patrick. [place].  The palliative care team will continue to follow. Please call our office at [phone] if we can be of additional assistance.   Arley Salamone Z Shaquel Chavous, NP   COVID-19 PATIENT SCREENING TOOL Asked and negative response unless otherwise noted:   Have you had symptoms of covid, tested positive or been in contact with someone with symptoms/positive test in the past 5-10 days?  no

## 2021-12-26 ENCOUNTER — Ambulatory Visit: Payer: Medicare Other | Admitting: Nurse Practitioner

## 2021-12-26 DIAGNOSIS — G20 Parkinson's disease: Secondary | ICD-10-CM

## 2021-12-26 DIAGNOSIS — R0602 Shortness of breath: Secondary | ICD-10-CM

## 2021-12-26 DIAGNOSIS — Z515 Encounter for palliative care: Secondary | ICD-10-CM

## 2021-12-26 DIAGNOSIS — R5381 Other malaise: Secondary | ICD-10-CM

## 2021-12-26 DIAGNOSIS — G43001 Migraine without aura, not intractable, with status migrainosus: Secondary | ICD-10-CM

## 2021-12-26 DIAGNOSIS — N390 Urinary tract infection, site not specified: Secondary | ICD-10-CM

## 2021-12-26 DIAGNOSIS — G20A1 Parkinson's disease without dyskinesia, without mention of fluctuations: Secondary | ICD-10-CM

## 2021-12-27 ENCOUNTER — Encounter: Payer: Self-pay | Admitting: Nurse Practitioner

## 2021-12-27 NOTE — Progress Notes (Signed)
[facility] Consult Note Telephone: [phone]  Fax: [phone]    Date of encounter: 12/27/21 8:20 AM PATIENT NAME: Patrick Montoya [address] [place] [id number]   [phone] (home)  DOB: 08/24/1940 MRN: [id number] PRIMARY CARE PROVIDER:    Tracie Harrier, MD,  [address] [place] [address] [phone]  RESPONSIBLE PARTY:    Contact Information     Name Relation Home Work Mobile   [name] Spouse   [phone]   [name], [name]   [place] Granddaughter   [phone]      Due to the COVID-19 crisis, this visit was done via telemedicine from my office and it was initiated and consent by this patient and or family.  I connected with  Patrick Montoya OR PROXY on 12/27/21 by telephone as video not available enabled telemedicine application and verified that I am speaking with the correct person.   I discussed the limitations of evaluation and management by telemedicine. The patient expressed understanding and agreed to proceed. Palliative Care was asked to follow this patient by consultation request of  Tracie Harrier, MD to address advance care planning and complex medical decision making. This is a follow up visit.   1. Advance Care Planning;  Full code with ongoing discussions of full code vs DNR; introduced MOST form, wishes are to continue to review with family. Will revisit at next [facility] f/u visit   2. Debility secondary to Parkinson disease discussed at length disease progression, functional limitations. Continue with therapy to improve weakness, balance, self independence. Monitor disease progression. Supportive role continue with PC.   3. Shortness of breath discussed concern for PNA with clinical presentation with COPD/supplemental O2, discussed to continue inhalation therapy, monitor respiratory status. We talked about importance of resting.  Continue OP Pulmonary appointments. Continue completion of Levaquin  4. UTI; reviewed labs; discussed with Patrick Montoya to continue Levaquin to completion, increase oral fluids  5. Migraine headache, symptoms uncontrolled with current regimen, discussed at length pain management; we talked about moving appointment with Dr Trena Platt Neurology (called and requested) office up, will contact Patrick. Colleran to do to. We talked about possibility of CT scan. Medications reviewed.   6. Palliative care encounter; Palliative care encounter; Palliative medicine team will continue to support patient, patient's family, and medical team. Visit consisted of counseling and education dealing with the complex and emotionally intense issues of symptom management and palliative care in the setting of serious and potentially life-threatening illness   Follow up Palliative Care Visit: Palliative care will continue to follow for complex medical decision making, advance care planning, and clarification of goals. Return 4 weeks or prn.   I spent 35 minutes providing this consultation. More than 50% of the time in this consultation was spent in counseling and care coordination. PPS: 50% Chief Complaint: Initial palliative consult for complex medical decision making, address goals, manage ongoing symptoms   HISTORY OF PRESENT ILLNESS:  Patrick Montoya is a 81 y.o. year old male  with multiple medical problems including Parkinson disease, COPD '@3L'$ /[place], afib, BPH, gastric ulcer disease, HBV, GERD, anxiety, depression. Hospitalized 08/01/2021 to 3//25/2023 for fever, sob with workup significant for COPD exacerbation with sepsis secondary to multifocal PNA, acute on chronic respiratory failure with hypoxia. Stabilized and d/c home with home health, therapy which he continued. Saw Patrick Montoya in person for f/u PC visit on 12/19/2021 with episode of tachycardia, ?afib the evening  before with episode of dizziness then resolved, developed a cough,  productive beige color and concern for PNA; able to see Dr Ginette Pitman same day for further evaluation, did labs; cxr and started on Doxycycline, then when labs resulted, +UTI changed to Levaquin and requested to see Cardiology soon. I called Patrick. Montoya for f/u telemedicine visit telephonic as video not available. Patrick. Montoya updated on visit with Dr Ginette Pitman, pending Cardiology visit. Patrick. Montoya also see's Pulmonology. We talked about UTI. Patrick. Montoya endorses today worsening migrane headaches, chronic in nature appears to be more frequent, worsening with current medications not helping Maxalt, Imitrex. We talked about Neurology pending appointment in December. We talked about trying to schedule visit sooner and he also has f/u visit with Dr Ginette Pitman this week. Encouraged Patrick. Montoya to further discuss with Dr Ginette Pitman and will also call Dr Manuella Ghazi Neurology office to see if can get appointment moved up. Dr Manuella Ghazi office endorses they will contact Patrick. Montoya to schedule. Discussed my need CT brain scan. We talked about ros, no changes in functional abilities, No falls. We talked about appetite remains declined, supplements. We talked about his wife currently being under hospice services as he is primary caregiver. We talked about f/u pc visit, scheduled. Will continue to monitor, follow close as he continues to have more complicated chronic disease progression, maybe getting closer to option of hospice services. We talked about role pc, Therapeutic listening, emotional support provided. Questions answered  Medications and Allergies  Current Medications Current Outpatient Medications  Medication Sig Dispense Refill  albuterol (PROVENTIL) 2.5 mg /3 mL (0.083 %) nebulizer solution Take 2.5 mg by nebulization every 6 (six) hours as needed for Wheezing.  ALPRAZolam (XANAX) 0.5 MG tablet Take 1 tablet (0.5 mg total) by mouth at bedtime as needed for Sleep 30 tablet 2  budesonide-formoteroL (SYMBICORT) 160-4.5 mcg/actuation  inhaler Inhale 2 inhalations into the lungs 2 (two) times daily  carbidopa-levodopa (SINEMET) 25-100 mg tablet Take 2 tablets by mouth 5 (five) times daily for 90 days 900 tablet 3  cyanocobalamin, vitamin B-12, (VITAMIN B-12 ORAL) Take 1 tablet by mouth once daily  doxazosin (CARDURA) 4 MG tablet Take 1 tablet (4 mg total) by mouth once daily 90 tablet 1  erythromycin (ROMYCIN) ophthalmic ointment APPLY A SMALL AMOUNT INTO LEFT EYE AT BEDTIME  galcanezumab-gnlm 120 mg/mL PnIj Inject 120 mg subcutaneously every 28 (twenty-eight) days 1 mL 10  KLOR-CON M20 20 mEq ER tablet (Patient not taking: Reported on 10/04/2021)  metoprolol succinate (TOPROL-XL) 25 MG XL tablet Take 1 tablet (25 mg total) by mouth once daily (Patient taking differently: Take 50 mg by mouth once daily) 30 tablet 11  miscellaneous medical supply Misc 3 Liter O2 - CONTINUOUS (route: Oxygen)  montelukast (SINGULAIR) 10 mg tablet Take 1 tablet (10 mg total) by mouth at bedtime 30 tablet 11  MYRBETRIQ 25 mg ER Tablet  omeprazole (PRILOSEC) 40 MG DR capsule Take 1 capsule (40 mg total) by mouth once daily for 180 days 90 capsule 1  QUEtiapine (SEROQUEL) 50 MG tablet Take 1 tablet (50 mg total) by mouth at bedtime for 180 days 90 tablet 1  rizatriptan (MAXALT) 10 MG tablet Take 1 tablet (10 mg total) by mouth as directed for Migraine Take one tablet as needed for onset of headache 10 tablet 6  SUMAtriptan (IMITREX) 100 MG tablet Take 1 tablet (100 mg total) by mouth as directed for Migraine May take a second dose after 2 hours if needed. 9 tablet 5  tiotropium (SPIRIVA) 18 mcg inhalation capsule Place 1 capsule (18 mcg total) into inhaler and inhale once daily 90 capsule 3  tiotropium bromide (SPIRIVA RESPIMAT) 2.5 mcg/actuation inhalation spray Inhale 2 inhalations (5 mcg total) into the lungs once daily 12 g 3  VENTOLIN HFA 90 mcg/actuation inhaler INHALE 2 PUFFS BY MOUTH EVERY 6 HOURS AS NEEDED FOR WHEEZING 54 g 0  warfarin  (COUMADIN) 2.5 MG tablet Take 1 tablet (2.5 mg total) by mouth once daily 90 tablet 1   Discussed medical goals, role pc in poc. Therapeutic listening, emotional support provided. Mrs Nicodemus was concerned about being left alone if Patrick Hark is admitted, notified Hospice as she is under Hospice services per Patrick and Mrs Nienhuis request.   We talked about role pc in 60. We talked about f/u pc visit, scheduled. Therapeutic listening and emotional support provided. Questions answered.    History obtained from review of EMR, discussion with Mrs and Patrick. Clauss in person with son [name] by conference call I reviewed available labs, medications, imaging, studies and related documents from the EMR.  Records reviewed and summarized above.    ROS 10 point system reviewed all negative except HPI   Physical Exam:                           Thank you for the opportunity to participate in the care of Patrick. Marquis.  The palliative care team will continue to follow. Please call our office at [phone] if we can be of additional assistance.   Kinisha Soper Ihor Gully, NP

## 2022-01-22 ENCOUNTER — Other Ambulatory Visit: Payer: Medicare Other | Admitting: Nurse Practitioner

## 2022-01-23 ENCOUNTER — Other Ambulatory Visit: Payer: Medicare Other | Admitting: Nurse Practitioner

## 2022-02-20 ENCOUNTER — Other Ambulatory Visit: Payer: Self-pay

## 2022-02-20 ENCOUNTER — Inpatient Hospital Stay
Admission: EM | Admit: 2022-02-20 | Discharge: 2022-02-26 | DRG: 871 | Disposition: A | Discharge status: Hospice | Attending: Osteopathic Medicine | Admitting: Osteopathic Medicine

## 2022-02-20 ENCOUNTER — Emergency Department

## 2022-02-20 DIAGNOSIS — A419 Sepsis, unspecified organism: Principal | ICD-10-CM | POA: Diagnosis present

## 2022-02-20 DIAGNOSIS — Z66 Do not resuscitate: Secondary | ICD-10-CM | POA: Diagnosis present

## 2022-02-20 DIAGNOSIS — R042 Hemoptysis: Secondary | ICD-10-CM | POA: Diagnosis not present

## 2022-02-20 DIAGNOSIS — R1310 Dysphagia, unspecified: Secondary | ICD-10-CM | POA: Diagnosis present

## 2022-02-20 DIAGNOSIS — I952 Hypotension due to drugs: Secondary | ICD-10-CM | POA: Diagnosis present

## 2022-02-20 DIAGNOSIS — R7401 Elevation of levels of liver transaminase levels: Secondary | ICD-10-CM | POA: Diagnosis present

## 2022-02-20 DIAGNOSIS — I4891 Unspecified atrial fibrillation: Secondary | ICD-10-CM | POA: Diagnosis present

## 2022-02-20 DIAGNOSIS — Z9981 Dependence on supplemental oxygen: Secondary | ICD-10-CM | POA: Diagnosis not present

## 2022-02-20 DIAGNOSIS — R053 Chronic cough: Secondary | ICD-10-CM | POA: Diagnosis present

## 2022-02-20 DIAGNOSIS — Z87891 Personal history of nicotine dependence: Secondary | ICD-10-CM

## 2022-02-20 DIAGNOSIS — G20A1 Parkinson's disease without dyskinesia, without mention of fluctuations: Secondary | ICD-10-CM | POA: Diagnosis present

## 2022-02-20 DIAGNOSIS — Z7951 Long term (current) use of inhaled steroids: Secondary | ICD-10-CM

## 2022-02-20 DIAGNOSIS — B965 Pseudomonas (aeruginosa) (mallei) (pseudomallei) as the cause of diseases classified elsewhere: Secondary | ICD-10-CM | POA: Diagnosis present

## 2022-02-20 DIAGNOSIS — Z20822 Contact with and (suspected) exposure to covid-19: Secondary | ICD-10-CM | POA: Diagnosis present

## 2022-02-20 DIAGNOSIS — N390 Urinary tract infection, site not specified: Secondary | ICD-10-CM | POA: Diagnosis present

## 2022-02-20 DIAGNOSIS — J9621 Acute and chronic respiratory failure with hypoxia: Secondary | ICD-10-CM | POA: Diagnosis present

## 2022-02-20 DIAGNOSIS — Z79899 Other long term (current) drug therapy: Secondary | ICD-10-CM

## 2022-02-20 DIAGNOSIS — I4811 Longstanding persistent atrial fibrillation: Secondary | ICD-10-CM | POA: Diagnosis not present

## 2022-02-20 DIAGNOSIS — R0602 Shortness of breath: Secondary | ICD-10-CM | POA: Diagnosis present

## 2022-02-20 DIAGNOSIS — R54 Age-related physical debility: Secondary | ICD-10-CM | POA: Diagnosis present

## 2022-02-20 DIAGNOSIS — Z23 Encounter for immunization: Secondary | ICD-10-CM | POA: Diagnosis not present

## 2022-02-20 DIAGNOSIS — T461X5A Adverse effect of calcium-channel blockers, initial encounter: Secondary | ICD-10-CM | POA: Diagnosis present

## 2022-02-20 DIAGNOSIS — Z7901 Long term (current) use of anticoagulants: Secondary | ICD-10-CM

## 2022-02-20 DIAGNOSIS — J441 Chronic obstructive pulmonary disease with (acute) exacerbation: Secondary | ICD-10-CM

## 2022-02-20 DIAGNOSIS — J439 Emphysema, unspecified: Secondary | ICD-10-CM | POA: Diagnosis present

## 2022-02-20 DIAGNOSIS — J189 Pneumonia, unspecified organism: Secondary | ICD-10-CM | POA: Diagnosis present

## 2022-02-20 DIAGNOSIS — R6520 Severe sepsis without septic shock: Secondary | ICD-10-CM | POA: Diagnosis present

## 2022-02-20 DIAGNOSIS — N400 Enlarged prostate without lower urinary tract symptoms: Secondary | ICD-10-CM | POA: Diagnosis present

## 2022-02-20 DIAGNOSIS — I2489 Other forms of acute ischemic heart disease: Secondary | ICD-10-CM | POA: Diagnosis present

## 2022-02-20 DIAGNOSIS — I480 Paroxysmal atrial fibrillation: Secondary | ICD-10-CM | POA: Diagnosis present

## 2022-02-20 DIAGNOSIS — E876 Hypokalemia: Secondary | ICD-10-CM | POA: Diagnosis present

## 2022-02-20 DIAGNOSIS — I10 Essential (primary) hypertension: Secondary | ICD-10-CM | POA: Diagnosis present

## 2022-02-20 DIAGNOSIS — T45515A Adverse effect of anticoagulants, initial encounter: Secondary | ICD-10-CM | POA: Diagnosis present

## 2022-02-20 DIAGNOSIS — J9600 Acute respiratory failure, unspecified whether with hypoxia or hypercapnia: Secondary | ICD-10-CM | POA: Diagnosis not present

## 2022-02-20 DIAGNOSIS — Z85828 Personal history of other malignant neoplasm of skin: Secondary | ICD-10-CM | POA: Diagnosis not present

## 2022-02-20 DIAGNOSIS — K219 Gastro-esophageal reflux disease without esophagitis: Secondary | ICD-10-CM | POA: Diagnosis present

## 2022-02-20 DIAGNOSIS — J690 Pneumonitis due to inhalation of food and vomit: Secondary | ICD-10-CM | POA: Diagnosis present

## 2022-02-20 DIAGNOSIS — J449 Chronic obstructive pulmonary disease, unspecified: Secondary | ICD-10-CM | POA: Diagnosis present

## 2022-02-20 DIAGNOSIS — J9611 Chronic respiratory failure with hypoxia: Secondary | ICD-10-CM

## 2022-02-20 DIAGNOSIS — Z87440 Personal history of urinary (tract) infections: Secondary | ICD-10-CM

## 2022-02-20 DIAGNOSIS — Z800 Family history of malignant neoplasm of digestive organs: Secondary | ICD-10-CM

## 2022-02-20 DIAGNOSIS — Z9049 Acquired absence of other specified parts of digestive tract: Secondary | ICD-10-CM

## 2022-02-20 LAB — CBC WITH DIFFERENTIAL/PLATELET
Abs Immature Granulocytes: 0.01 10*3/uL (ref 0.00–0.07)
Basophils Absolute: 0 10*3/uL (ref 0.0–0.1)
Basophils Relative: 0 %
Eosinophils Absolute: 0 10*3/uL (ref 0.0–0.5)
Eosinophils Relative: 1 %
HCT: 39.8 % (ref 39.0–52.0)
Hemoglobin: 12.5 g/dL — ABNORMAL LOW (ref 13.0–17.0)
Immature Granulocytes: 0 %
Lymphocytes Relative: 7 %
Lymphs Abs: 0.5 10*3/uL — ABNORMAL LOW (ref 0.7–4.0)
MCH: 32 pg (ref 26.0–34.0)
MCHC: 31.4 g/dL (ref 30.0–36.0)
MCV: 101.8 fL — ABNORMAL HIGH (ref 80.0–100.0)
Monocytes Absolute: 0.5 10*3/uL (ref 0.1–1.0)
Monocytes Relative: 7 %
Neutro Abs: 5.9 10*3/uL (ref 1.7–7.7)
Neutrophils Relative %: 85 %
Platelets: 107 10*3/uL — ABNORMAL LOW (ref 150–400)
RBC: 3.91 MIL/uL — ABNORMAL LOW (ref 4.22–5.81)
RDW: 15.7 % — ABNORMAL HIGH (ref 11.5–15.5)
WBC: 6.9 10*3/uL (ref 4.0–10.5)
nRBC: 0 % (ref 0.0–0.2)

## 2022-02-20 LAB — BLOOD GAS, VENOUS
Acid-Base Excess: 3.6 mmol/L — ABNORMAL HIGH (ref 0.0–2.0)
Bicarbonate: 30.1 mmol/L — ABNORMAL HIGH (ref 20.0–28.0)
O2 Saturation: 91.5 %
Patient temperature: 37
pCO2, Ven: 52 mmHg (ref 44–60)
pH, Ven: 7.37 (ref 7.25–7.43)
pO2, Ven: 61 mmHg — ABNORMAL HIGH (ref 32–45)

## 2022-02-20 LAB — TROPONIN I (HIGH SENSITIVITY)
Troponin I (High Sensitivity): 66 ng/L — ABNORMAL HIGH (ref <18)
Troponin I (High Sensitivity): 37 ng/L — ABNORMAL HIGH (ref <18)

## 2022-02-20 LAB — URINALYSIS, COMPLETE (UACMP) WITH MICROSCOPIC
Bacteria, UA: NONE SEEN
Bilirubin Urine: NEGATIVE
Glucose, UA: NEGATIVE mg/dL
Ketones, ur: NEGATIVE mg/dL
Nitrite: NEGATIVE
Protein, ur: NEGATIVE mg/dL
Specific Gravity, Urine: >1.046 — ABNORMAL HIGH (ref 1.005–1.030)
pH: 5 (ref 5.0–8.0)

## 2022-02-20 LAB — COMPREHENSIVE METABOLIC PANEL
ALT: 46 U/L — ABNORMAL HIGH (ref 0–44)
AST: 112 U/L — ABNORMAL HIGH (ref 15–41)
Albumin: 2.8 g/dL — ABNORMAL LOW (ref 3.5–5.0)
Alkaline Phosphatase: 60 U/L (ref 38–126)
Anion gap: 5 (ref 5–15)
BUN: 24 mg/dL — ABNORMAL HIGH (ref 8–23)
CO2: 28 mmol/L (ref 22–32)
Calcium: 8.1 mg/dL — ABNORMAL LOW (ref 8.9–10.3)
Chloride: 109 mmol/L (ref 98–111)
Creatinine, Ser: 0.91 mg/dL (ref 0.61–1.24)
GFR, Estimated: >60 mL/min (ref >60)
Glucose, Bld: 130 mg/dL — ABNORMAL HIGH (ref 70–99)
Potassium: 3.3 mmol/L — ABNORMAL LOW (ref 3.5–5.1)
Sodium: 142 mmol/L (ref 135–145)
Total Bilirubin: 1.2 mg/dL (ref 0.3–1.2)
Total Protein: 5.7 g/dL — ABNORMAL LOW (ref 6.5–8.1)

## 2022-02-20 LAB — PROTIME-INR
INR: 2.3 — ABNORMAL HIGH (ref 0.8–1.2)
Prothrombin Time: 25.4 seconds — ABNORMAL HIGH (ref 11.4–15.2)

## 2022-02-20 LAB — RESP PANEL BY RT-PCR (FLU A&B, COVID) ARPGX2
Influenza A by PCR: NEGATIVE
Influenza B by PCR: NEGATIVE
SARS Coronavirus 2 by RT PCR: NEGATIVE

## 2022-02-20 LAB — PROCALCITONIN: Procalcitonin: 5.54 ng/mL

## 2022-02-20 LAB — LACTIC ACID, PLASMA
Lactic Acid, Venous: 2.1 mmol/L (ref 0.5–1.9)
Lactic Acid, Venous: 2.2 mmol/L (ref 0.5–1.9)

## 2022-02-20 LAB — MAGNESIUM: Magnesium: 1.4 mg/dL — ABNORMAL LOW (ref 1.7–2.4)

## 2022-02-20 LAB — APTT: aPTT: 55 seconds — ABNORMAL HIGH (ref 24–36)

## 2022-02-20 MED ORDER — LACTATED RINGERS IV BOLUS (SEPSIS)
1000.0000 mL | Freq: Once | INTRAVENOUS | Status: AC
  Administered 2022-02-20: 1000 mL via INTRAVENOUS

## 2022-02-20 MED ORDER — SUMATRIPTAN SUCCINATE 50 MG PO TABS
100.0000 mg | ORAL_TABLET | ORAL | Status: DC | PRN
  Administered 2022-02-20 – 2022-02-23 (×3): 100 mg via ORAL
  Filled 2022-02-20 (×5): qty 2

## 2022-02-20 MED ORDER — IPRATROPIUM-ALBUTEROL 0.5-2.5 (3) MG/3ML IN SOLN
3.0000 mL | Freq: Once | RESPIRATORY_TRACT | Status: AC
  Administered 2022-02-20: 3 mL via RESPIRATORY_TRACT
  Filled 2022-02-20: qty 3

## 2022-02-20 MED ORDER — AMIODARONE LOAD VIA INFUSION
150.0000 mg | Freq: Once | INTRAVENOUS | Status: AC
  Administered 2022-02-20: 150 mg via INTRAVENOUS
  Filled 2022-02-20: qty 83.34

## 2022-02-20 MED ORDER — WARFARIN SODIUM 1 MG PO TABS
1.5000 mg | ORAL_TABLET | Freq: Once | ORAL | Status: AC
  Administered 2022-02-20: 1.5 mg via ORAL
  Filled 2022-02-20: qty 1

## 2022-02-20 MED ORDER — ONDANSETRON HCL 4 MG PO TABS: 4.0000 mg | ORAL_TABLET | Freq: Four times a day (QID) | ORAL | Status: DC | PRN

## 2022-02-20 MED ORDER — WARFARIN SODIUM 2.5 MG PO TABS
2.5000 mg | ORAL_TABLET | Freq: Every day | ORAL | Status: DC
  Filled 2022-02-20: qty 1

## 2022-02-20 MED ORDER — PIPERACILLIN-TAZOBACTAM 3.375 G IVPB
3.3750 g | Freq: Three times a day (TID) | INTRAVENOUS | Status: DC
  Administered 2022-02-20 – 2022-02-26 (×18): 3.375 g via INTRAVENOUS
  Filled 2022-02-20 (×18): qty 50

## 2022-02-20 MED ORDER — IPRATROPIUM-ALBUTEROL 0.5-2.5 (3) MG/3ML IN SOLN
3.0000 mL | Freq: Four times a day (QID) | RESPIRATORY_TRACT | Status: DC | PRN
  Administered 2022-02-22 – 2022-02-25 (×4): 3 mL via RESPIRATORY_TRACT
  Filled 2022-02-20 (×4): qty 3

## 2022-02-20 MED ORDER — POTASSIUM CHLORIDE 2 MEQ/ML IV SOLN
INTRAVENOUS | Status: DC
  Filled 2022-02-20 (×4): qty 1000

## 2022-02-20 MED ORDER — CARBIDOPA-LEVODOPA 25-100 MG PO TABS
2.0000 | ORAL_TABLET | Freq: Every day | ORAL | Status: DC
  Administered 2022-02-20 – 2022-02-26 (×26): 2 via ORAL
  Filled 2022-02-20 (×31): qty 2

## 2022-02-20 MED ORDER — VITAMIN B-12 1000 MCG PO TABS
2000.0000 ug | ORAL_TABLET | Freq: Every day | ORAL | Status: DC
  Administered 2022-02-22 – 2022-02-26 (×5): 2000 ug via ORAL
  Filled 2022-02-20 (×6): qty 2

## 2022-02-20 MED ORDER — ALPRAZOLAM 0.5 MG PO TABS
0.5000 mg | ORAL_TABLET | Freq: Every evening | ORAL | Status: DC | PRN
  Administered 2022-02-21 – 2022-02-25 (×3): 0.5 mg via ORAL
  Filled 2022-02-20 (×3): qty 1

## 2022-02-20 MED ORDER — MOMETASONE FURO-FORMOTEROL FUM 200-5 MCG/ACT IN AERO
2.0000 | INHALATION_SPRAY | Freq: Two times a day (BID) | RESPIRATORY_TRACT | Status: DC
  Administered 2022-02-22 – 2022-02-26 (×9): 2 via RESPIRATORY_TRACT
  Filled 2022-02-20: qty 8.8

## 2022-02-20 MED ORDER — ONDANSETRON HCL 4 MG/2ML IJ SOLN: 4.0000 mg | Freq: Four times a day (QID) | INTRAMUSCULAR | Status: DC | PRN

## 2022-02-20 MED ORDER — LACTATED RINGERS IV BOLUS
1000.0000 mL | Freq: Once | INTRAVENOUS | Status: AC
  Administered 2022-02-20: 1000 mL via INTRAVENOUS

## 2022-02-20 MED ORDER — ACETAMINOPHEN 325 MG PO TABS: 650.0000 mg | ORAL_TABLET | Freq: Four times a day (QID) | ORAL | Status: DC | PRN

## 2022-02-20 MED ORDER — VANCOMYCIN HCL IN DEXTROSE 1-5 GM/200ML-% IV SOLN
1000.0000 mg | Freq: Once | INTRAVENOUS | Status: AC
  Administered 2022-02-20: 1000 mg via INTRAVENOUS
  Filled 2022-02-20: qty 200

## 2022-02-20 MED ORDER — QUETIAPINE FUMARATE 25 MG PO TABS
50.0000 mg | ORAL_TABLET | Freq: Every day | ORAL | Status: DC
  Administered 2022-02-21 – 2022-02-25 (×5): 50 mg via ORAL
  Filled 2022-02-20 (×5): qty 2

## 2022-02-20 MED ORDER — MAGNESIUM SULFATE 2 GM/50ML IV SOLN
2.0000 g | INTRAVENOUS | Status: AC
  Administered 2022-02-20: 2 g via INTRAVENOUS
  Filled 2022-02-20: qty 50

## 2022-02-20 MED ORDER — ACETAMINOPHEN 650 MG RE SUPP: 650.0000 mg | Freq: Four times a day (QID) | RECTAL | Status: DC | PRN

## 2022-02-20 MED ORDER — DILTIAZEM HCL-DEXTROSE 125-5 MG/125ML-% IV SOLN (PREMIX)
5.0000 mg/h | INTRAVENOUS | Status: DC
  Administered 2022-02-20: 5 mg/h via INTRAVENOUS
  Filled 2022-02-20: qty 125

## 2022-02-20 MED ORDER — DILTIAZEM HCL 25 MG/5ML IV SOLN
20.0000 mg | Freq: Once | INTRAVENOUS | Status: AC
  Administered 2022-02-20: 20 mg via INTRAVENOUS
  Filled 2022-02-20: qty 5

## 2022-02-20 MED ORDER — AMIODARONE HCL IN DEXTROSE 360-4.14 MG/200ML-% IV SOLN
60.0000 mg/h | INTRAVENOUS | Status: DC
  Administered 2022-02-20 (×2): 60 mg/h via INTRAVENOUS
  Filled 2022-02-20 (×2): qty 200

## 2022-02-20 MED ORDER — SODIUM CHLORIDE 0.9 % IV SOLN
2.0000 g | Freq: Once | INTRAVENOUS | Status: AC
  Administered 2022-02-20: 2 g via INTRAVENOUS
  Filled 2022-02-20: qty 12.5

## 2022-02-20 MED ORDER — PANTOPRAZOLE SODIUM 40 MG PO TBEC
40.0000 mg | DELAYED_RELEASE_TABLET | Freq: Every day | ORAL | Status: DC
  Administered 2022-02-22 – 2022-02-26 (×5): 40 mg via ORAL
  Filled 2022-02-20 (×6): qty 1

## 2022-02-20 MED ORDER — FERROUS GLUCONATE 324 (38 FE) MG PO TABS
324.0000 mg | ORAL_TABLET | Freq: Every day | ORAL | Status: DC
  Administered 2022-02-22 – 2022-02-26 (×5): 324 mg via ORAL
  Filled 2022-02-20 (×6): qty 1

## 2022-02-20 MED ORDER — MONTELUKAST SODIUM 10 MG PO TABS
10.0000 mg | ORAL_TABLET | Freq: Every day | ORAL | Status: DC
  Administered 2022-02-21 – 2022-02-25 (×5): 10 mg via ORAL
  Filled 2022-02-20 (×5): qty 1

## 2022-02-20 MED ORDER — IOHEXOL 350 MG/ML SOLN
75.0000 mL | Freq: Once | INTRAVENOUS | Status: AC | PRN
  Administered 2022-02-20: 75 mL via INTRAVENOUS

## 2022-02-20 MED ORDER — AMIODARONE HCL IN DEXTROSE 360-4.14 MG/200ML-% IV SOLN
30.0000 mg/h | INTRAVENOUS | Status: DC
  Administered 2022-02-20 – 2022-02-21 (×2): 30 mg/h via INTRAVENOUS
  Filled 2022-02-20 (×2): qty 200

## 2022-02-20 MED ORDER — WARFARIN - PHARMACIST DOSING INPATIENT
Freq: Every day | Status: DC
  Filled 2022-02-20: qty 1

## 2022-02-20 MED ORDER — DIPHENHYDRAMINE HCL 50 MG/ML IJ SOLN
6.2500 mg | Freq: Once | INTRAMUSCULAR | Status: AC
  Administered 2022-02-20: 6.5 mg via INTRAVENOUS
  Filled 2022-02-20: qty 1

## 2022-02-20 NOTE — Consult Note (Signed)
Patrick Montoya       Patient ID: Patrick Montoya MRN: [id number] DOB/AGE: [birth year] 82 y.o.  Admit date: 02/20/2022 Referring Physician Dr. Francine Graven Primary Physician Dr. Ginette Pitman Primary Cardiologist Dr. Nehemiah Massed (prev Dr. 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  He was found to be in atrial fibrillation with RVR on admission with heart rates peaking in the 170s, for which cardiology is consulted for further assistance.  He has had several episodes of paroxysmal atrial fibrillation over the past year and is usually symptomatic with burning chest discomfort.  Usually occurs in the setting of fever and pneumonia, has responded well to calcium channel blockers IV in the emergency department in the past.  He had a Lexiscan Myoview in February 2023 that did not show significant ischemia but showed a small area in the basal septum which was predominantly fixed.Patrick Montoya and examined the patient at bedside he states she "is not so good."  He started feeling poorly with increased shortness of breath 24 to 36 hours ago and he thinks  his "A-fib kicked in."  He was seen by his PCP at the beginning of August and was treated with 10 days of doxycycline and a prednisone taper for bronchitis.  Reports a chronic cough with worsening recently with dark sputum production. Also notes a low-grade fever several days ago.  Reports significant chest discomfort while he was in the ambulance when his heart rate was reportedly in the 200s, this has eased off significantly now.  He feels short of breath above his baseline, but denies dizziness, peripheral edema, orthopnea.  Reports compliance  with his metoprolol and Coumadin.  In the ED, he was started on broad-spectrum antibiotics, given 2 L of LR in addition to magnesium repletion.  He was given IV diltiazem 20 mg x 1 and started on a continuous diltiazem infusion which ran for about 20 minutes before discontinuing due to hypotension, blood pressure on presentation was elevated at 136/118 and dropped to a low of 69/51 with diltiazem.  He is on 6 L by nasal cannula on top of his baseline of 3L.  Current blood pressure is 90/64 while his second unit of LR is infusing with a third liter with potassium chloride ordered.  Labs are notable for a potassium of 3.3, BUN/creatinine 24/0.91 and GFR greater than 60.  Slight elevation of transaminases with AST ALT 112/46 respectively.  Initial high-sensitivity troponin minimally elevated at 66 with repeats pending.  INR within therapeutic range at 2.3.  WBCs within normal limits at 6.9, H&H stable at 12.5/39.8.  Respiratory panel negative for influenza or COVID.  UA with trace leukocytes.  CTA chest without evidence of acute pulmonary embolus, revealed patchy airspace opacities in the right lung consistent with multilobar pneumonia predominantly in the dependent location could be secondary to aspiration.  Chest x-ray with right lower lobe pneumonia and a slight degree of interstitial edema.  Review of systems complete and found to be negative unless listed above     Past Medical History:  Diagnosis Date   Basal cell carcinoma 07/25/2015   L zygoma inf to lat canthus   Basal cell carcinoma 06/11/2018  L nasal ala   Basal cell carcinoma 10/04/2019   Left nose. Infiltrative pattern.   BPH (benign prostatic hyperplasia)    COPD (chronic obstructive pulmonary disease) (HCC)    Elevated liver enzymes    Emphysema of lung (HCC)    Gastric ulcer    GERD (gastroesophageal reflux disease)    Hepatitis B    Hypertension    Parkinson's disease    Prostate hypertrophy    Rotator cuff tear right    UTI (urinary tract infection)     Past Surgical History:  Procedure Laterality Date   CHOLECYSTECTOMY     COLONOSCOPY WITH PROPOFOL N/A 10/20/2015   Procedure: COLONOSCOPY WITH PROPOFOL;  Surgeon: Manya Silvas, MD;  Location: [place];  Service: Endoscopy;  Laterality: N/A;   COLONOSCOPY WITH PROPOFOL N/A 01/24/2021   Procedure: COLONOSCOPY WITH PROPOFOL;  Surgeon: Toledo, Benay Pike, MD;  Location: [facility] ENDOSCOPY;  Service: Gastroenterology;  Laterality: N/A;   ESOPHAGOGASTRODUODENOSCOPY (EGD) WITH PROPOFOL N/A 10/20/2015   Procedure: ESOPHAGOGASTRODUODENOSCOPY (EGD) WITH PROPOFOL;  Surgeon: Manya Silvas, MD;  Location: [facility] ENDOSCOPY;  Service: Endoscopy;  Laterality: N/A;   GREEN LIGHT LASER TURP (TRANSURETHRAL RESECTION OF PROSTATE N/A 11/17/2018   Procedure: GREEN LIGHT LASER TURP (TRANSURETHRAL RESECTION OF PROSTATE;  Surgeon: Royston Cowper, MD;  Location: [facility] ORS;  Service: Urology;  Laterality: N/A;   INGUINAL HERNIA REPAIR Right 02/18/2019   Procedure: HERNIA REPAIR INGUINAL ADULT;  Surgeon: Royston Cowper, MD;  Location: [facility] ORS;  Service: Urology;  Laterality: Right;   INSERTION OF MESH Right 02/18/2019   Procedure: INSERTION OF MESH;  Surgeon: Royston Cowper, MD;  Location: [facility] ORS;  Service: Urology;  Laterality: Right;   TONSILLECTOMY     TRANSURETHRAL MICROWAVE THERAPY      (Not in a hospital admission)  Social History   Socioeconomic History   Marital status: Married    Spouse name: Not on file   Number of children: Not on file   Years of education: Not on file   Highest education level: Not on file  Occupational History   Not on file  Tobacco Use   Smoking status: Former    Packs/day: 1.00    Years: 62.00    Total pack years: 62.00    Types: Cigarettes   Smokeless tobacco: Never  Vaping Use   Vaping Use: Never used  Substance and Sexual Activity   Alcohol use: Yes    Comment: occasional   Drug use: No   Sexual activity: Not on file  Other  Topics Concern   Not on file  Social History Narrative   Not on file   Social Determinants of Health   Financial Resource Strain: Not on file  Food Insecurity: Not on file  Transportation Needs: Not on file  Physical Activity: Not on file  Stress: Not on file  Social Connections: Not on file  Intimate Partner Violence: Not on file    Family History  Problem Relation Age of Onset   Colon cancer Mother    Hepatitis Son    Prostate cancer Neg Hx    Kidney cancer Neg Hx      Vitals:   02/20/22 1045 02/20/22 1100 02/20/22 1130 02/20/22 1223  BP: '90/61 97/69 92/64 '$ 90/64  Pulse: (!) 132 (!) 134 (!) 103 (!) 137  Resp: 16 (!) 33 (!) 27 (!) 27  Temp:      TempSrc:      SpO2: 93% 92% 94% 94%  PHYSICAL EXAM General: Elderly, acute on chronically ill appearing Caucasian male, in no acute distress.  Sitting upright in ED stretcher, resting with eyes closed, awakens to voice and light touch. HEENT:  Normocephalic and atraumatic. Neck:  No JVD.  Lungs: Short of breath appearing on 6 L by nasal cannula.  Decreased breath sounds on the right with expiratory wheezes on the left.   Heart: Tachycardic, irregularly irregular rate and rhythm. Normal S1 and S2 without gallops or murmurs.  Abdomen: Non-distended appearing.  Msk: Normal strength and tone for age. Extremities: Warm and well perfused. No clubbing, cyanosis.  No peripheral edema.  Neuro: Alert and oriented X 3. Psych:  Answers questions appropriately.   Labs: Basic Metabolic Panel: Recent Labs    02/20/22 1030  NA 142  K 3.3*  CL 109  CO2 28  GLUCOSE 130*  BUN 24*  CREATININE 0.91  CALCIUM 8.1*   Liver Function Tests: Recent Labs    02/20/22 1030  AST 112*  ALT 46*  ALKPHOS 60  BILITOT 1.2  PROT 5.7*  ALBUMIN 2.8*   No results for input(s): "LIPASE", "AMYLASE" in the last 72 hours. CBC: Recent Labs    02/20/22 1030  WBC 6.9  NEUTROABS 5.9  HGB 12.5*  HCT 39.8  MCV 101.8*  PLT 107*   Cardiac  Enzymes: Recent Labs    02/20/22 1030  TROPONINIHS 66*   BNP: No results for input(s): "BNP" in the last 72 hours. D-Dimer: No results for input(s): "DDIMER" in the last 72 hours. Hemoglobin A1C: No results for input(s): "HGBA1C" in the last 72 hours. Fasting Lipid Panel: No results for input(s): "CHOL", "HDL", "LDLCALC", "TRIG", "CHOLHDL", "LDLDIRECT" in the last 72 hours. Thyroid Function Tests: No results for input(s): "TSH", "T4TOTAL", "T3FREE", "THYROIDAB" in the last 72 hours.  Invalid input(s): "FREET3" Anemia Panel: No results for input(s): "VITAMINB12", "FOLATE", "FERRITIN", "TIBC", "IRON", "RETICCTPCT" in the last 72 hours.   Radiology: CT Angio Chest PE W and/or Wo Contrast  Result Date: 02/20/2022 CLINICAL DATA:  Shortness of breath since last night. Pulmonary embolism suspected, high probability. History of chronic obstructive pulmonary disease on home oxygen. EXAM: CT ANGIOGRAPHY CHEST WITH CONTRAST TECHNIQUE: Multidetector CT imaging of the chest was performed using the standard protocol during bolus administration of intravenous contrast. Multiplanar CT image reconstructions and MIPs were obtained to evaluate the vascular anatomy. RADIATION DOSE REDUCTION: This exam was performed according to the departmental dose-optimization program which includes automated exposure control, adjustment of the mA and/or kV according to patient size and/or use of iterative reconstruction technique. CONTRAST:  37m OMNIPAQUE IOHEXOL 350 MG/ML SOLN COMPARISON:  Chest CTA 08/01/2021.  Chest radiographs 02/20/2022. FINDINGS: Cardiovascular: The pulmonary arteries are well opacified with contrast to the level of the subsegmental branches. There is no evidence of acute pulmonary embolism. Relatively mild atherosclerosis of the aorta, great vessels and coronary arteries. There is limited systemic arterial opacification without evidence of acute vascular abnormality. Possible aortic valvular  calcifications. The heart size is normal. A small amount of pericardial fluid appears unchanged. Mediastinum/Nodes: There are no enlarged mediastinal, hilar or axillary lymph nodes.Small mediastinal lymph nodes are unchanged, likely reactive. The thyroid gland, trachea and esophagus demonstrate no significant findings. Lungs/Pleura: Moderate centrilobular and paraseptal emphysema. There are superimposed patchy airspace opacities throughout the right lung consistent with multilobar pneumonia. There is minimal dependent opacity in the left lower lobe. Unchanged small perifissural nodule along the minor fissure on image 89/5. No suspicious pulmonary nodules. Upper abdomen: The visualized  upper abdomen appears stable status post cholecystectomy. No acute findings. Musculoskeletal/Chest wall: There is no chest wall mass or suspicious osseous finding. Review of the MIP images confirms the above findings. IMPRESSION: 1. No evidence of acute pulmonary embolism or other acute vascular findings in the chest. 2. Patchy airspace opacities throughout the right lung consistent with multilobar pneumonia. The airspace opacities are predominantly dependent in location and could be secondary to aspiration. Radiographic follow-up recommended to ensure resolution. 3. Aortic Atherosclerosis (ICD10-I70.0) and Emphysema (ICD10-J43.9). Electronically Signed   By: Richardean Sale M.D.   On: 02/20/2022 12:20   DG Chest Port 1 View  Result Date: 02/20/2022 CLINICAL DATA:  Questionable sepsis - evaluate for abnormality EXAM: PORTABLE CHEST 1 VIEW COMPARISON:  Radiograph 08/01/2021 FINDINGS: Unchanged cardiomediastinal silhouette. Mild diffuse interstitial opacities. There is airspace consolidation in the right lower lung. Trace right pleural effusion. No evidence of pneumothorax. No acute osseous abnormality. Thoracic spondylosis. IMPRESSION: Right lower lung pneumonia. Diffuse interstitial opacities could reflect a degree of interstitial  edema. Electronically Signed   By: Maurine Simmering M.D.   On: 02/20/2022 10:18    Lexiscan myoview 06/27/2021 Impression  1. No significant ischemia noted on current study.  2. There is a small size, moderate in intensity defect of the basal septal  segment which is predominantly fixed.  3. Normal LV systolic function, but with TID ratio of 1.23.  Narrative  Table formatting from the original result was not included.  [place]  [facility]  [address], [place], [place]  [id number]  [phone]   Procedure: Pharmacologic Myocardial Perfusion Imaging    ONE day procedure   Indication: Paroxysmal A-fib (CMS-HCC)  Plan: NM myocardial perfusion SPECT multiple (stress        and rest), ECG stress test only   Shortness of breath  Plan: NM myocardial perfusion SPECT multiple (stress        and rest), ECG stress test only   Ordering Physician:   Dr. Donnelly Angelica    Clinical History:  81 y.o. year old male  Vitals: Height: 69 in Weight: 170 lb  Cardiac risk factors include:     AFIB and HTN    Procedure:   Pharmacologic stress testing was performed with Regadenoson using a single  use 0.'4mg'$ /31m (0.08 mg/ml) prefilled syringe intravenously infused as a  bolus dose over 10-15 seconds. The stress test was stopped due to Infusion  completion.  Blood pressure response was normal. The patient did not  develop any symptoms other than fatigue during the procedure.   Rest HR: 71bpm  Rest BP: 150/88mg  Max HR: 88bpm  Min BP: 132/7826m   Stress Test Administered by: DINOswald HillockMA   ECG Interpretation:  Rest ECG:  normal sinus rhythm, none  Stress ECG:  normal sinus rhythm, none  Recovery ECG:  normal sinus rhythm  ECG Interpretation:  negative.    Administrations This Visit     regadenoson (LEXISCAN) 0.4 mg/5 mL inj syringe 0.4 mg     Admin Date  06/28/2021 Action  Given Dose  0.4 mg Route  Intravenous Administered By   GoaHerbert SetaNMT     technetium Tc99m17mtamibi (CARDIOLITE) injection 10.935.57licurie     Admin Date  06/28/2021 Action  Given Dose  10.932.20licurie Route  Intravenous Administered By  GoarHerbert SetaMT     technetium Tc99m 71mamibi (CARDIOLITE) injection 31.0125.42icurie     Admin Date  06/28/2021  Action  Given Dose  44.01 millicurie Route  Intravenous Administered By  Cherlynn Perches, CNMT   Gated post-stress perfusion imaging was performed 30 minutes after stress.  Rest images were performed 30 minutes after injection.   Gated LV Analysis:   Summary of LV Perfusion: Normal,   Summary of LV Function: Normal    TID Ratio: 1.24   LVEF= 58%   FINDINGS:  Regional wall motion:  reveals normal myocardial thickening and wall  motion.  The overall quality of the study is fair.    Artifacts noted: yes  Left ventricular cavity: normal.   ECHO 03/10/2021 IMPRESSIONS     1. Left ventricular ejection fraction, by estimation, is 55 to 60%. The  left ventricle has normal function. The left ventricle has no regional  wall motion abnormalities. Left ventricular diastolic parameters were  normal.   2. Right ventricular systolic function is normal. The right ventricular  size is normal.   3. The mitral valve is normal in structure. Mild mitral valve  regurgitation.   4. The aortic valve is normal in structure. Aortic valve regurgitation is  trivial.   FINDINGS   Left Ventricle: Left ventricular ejection fraction, by estimation, is 55  to 60%. The left ventricle has normal function. The left ventricle has no  regional wall motion abnormalities. The left ventricular internal cavity  size was normal in size. There is   no left ventricular hypertrophy. Left ventricular diastolic parameters  were normal.   Right Ventricle: The right ventricular size is normal. No increase in  right ventricular wall thickness. Right ventricular systolic function is  normal.    Left Atrium: Left atrial size was normal in size.   Right Atrium: Right atrial size was normal in size.   Pericardium: There is no evidence of pericardial effusion.   Mitral Valve: The mitral valve is normal in structure. Mild mitral valve  regurgitation.   Tricuspid Valve: The tricuspid valve is normal in structure. Tricuspid  valve regurgitation is trivial.   Aortic Valve: The aortic valve is normal in structure. Aortic valve  regurgitation is trivial. Aortic valve peak gradient measures 14.1 mmHg.   Pulmonic Valve: The pulmonic valve was normal in structure. Pulmonic valve  regurgitation is not visualized.   Aorta: The aortic root and ascending aorta are structurally normal, with  no evidence of dilitation.   IAS/Shunts: No atrial level shunt detected by color flow Doppler.      LEFT VENTRICLE  PLAX 2D  LVIDd:         4.10 cm   Diastology  LVIDs:         3.00 cm   LV e' medial:    5.11 cm/s  LV PW:         0.90 cm   LV E/e' medial:  16.3  LV IVS:        1.20 cm   LV e' lateral:   7.51 cm/s  LVOT diam:     1.70 cm   LV E/e' lateral: 11.1  LVOT Area:     2.27 cm      RIGHT VENTRICLE  RV Basal diam:  3.70 cm  RV Mid diam:    3.10 cm  RV S prime:     10.90 cm/s  TAPSE (M-mode): 1.9 cm   LEFT ATRIUM             Index        RIGHT ATRIUM  Index  LA diam:        3.90 cm 2.04 cm/m   RA Area:     16.70 cm  LA Vol (A2C):   50.6 ml 26.53 ml/m  RA Volume:   47.60 ml  24.96 ml/m  LA Vol (A4C):   65.7 ml 34.45 ml/m  LA Biplane Vol: 59.4 ml 31.14 ml/m   AORTIC VALVE                 PULMONIC VALVE  AV Area (Vmax): 1.39 cm     PV Vmax:        1.14 m/s  AV Vmax:        188.00 cm/s  PV Peak grad:   5.2 mmHg  AV Peak Grad:   14.1 mmHg    RVOT Peak grad: 2 mmHg  LVOT Vmax:      115.00 cm/s     AORTA  Ao Root diam: 2.80 cm   MITRAL VALVE                TRICUSPID VALVE  MV Area (PHT): 3.06 cm     TR Peak grad:   28.9 mmHg  MV Decel Time: 248 msec     TR Vmax:         269.00 cm/s  MV E velocity: 83.40 cm/s  MV A velocity: 102.00 cm/s  SHUNTS  MV E/A ratio:  0.82         Systemic Diam: 1.70 cm  MV A Prime:    10.9 cm/s   Serafina Royals MD  Electronically signed by Serafina Royals MD  Signature Date/Time: 03/12/2021/8:49:16 AM   TELEMETRY reviewed by me (LT) 02/20/2022 : Atrial fibrillation with RVR, rates between 130s to 140s with paroxysms to the 150s.  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  He was found to be in atrial fibrillation with RVR on admission with heart rates peaking in the 170s, for which cardiology is consulted for further assistance.  #Sepsis secondary to multifocal pneumonia, query aspiration #Chronic respiratory failure/COPD on baseline 3 L -Agree with current therapy per primary team, has been given 2 L of LR, started on broad-spectrum antibiotics -Pending swallow study with concern for aspiration pneumonia based on chest CT findings  #Paroxysmal atrial fibrillation with RVR Has had several episodes of this over the past year, usually always in the setting of fever or pneumonia.  Typically responsive to IV calcium channel blockers.  EKG shows atrial fibrillation with rate 168, on telemetry so far he has been consistently 130-140. -Continue to treat underlying causes of increased adrenergic tone, including infection,  hypoxia, pain -Monitor and replete electrolytes for goal K >4, mag >2 -S/p IV diltiazem 20 mg x 1, started on a diltiazem infusion which resulted in marked hypotension. -Start amiodarone bolus and continuous infusion for an attempt at rate and rhythm control.  If refractory to this, can trial  digoxin.  Historically, the patient requested referral to EP for consideration of ablation rather than antiarrhythmics. -Hold home metoprolol XL 25 mg once daily with hypotension -Continue warfarin per pharmacy consult -CHA2DS2-VASc 3 (age, HTN) -Echocardiogram complete (previous 02/2021: EF 55-60%, mild MR)  #Elevated troponin Initially 66 on first check, repeats pending.  Suspect elevation due to demand/supply mismatch in the setting of sepsis and tachycardia and not ACS  -Trend until peak, then stop.  This patient's plan of care was discussed and created with Dr. Clayborn Bigness and he is in agreement.  Signed: Tristan Schroeder , PA-C 02/20/2022, 1:26 PM [facility]

## 2022-02-20 NOTE — ED Notes (Signed)
Pt's wife [name] called and updated that pt will be admitted to the hospital.

## 2022-02-20 NOTE — H&P (Signed)
History and Physical    Patient: Patrick Montoya:[id number] DOB: Sep 04, 1940 DOA: 02/20/2022 DOS: the patient was seen and examined on 02/20/2022 PCP: Tracie Harrier, MD  Patient coming from: Home  Chief Complaint:  Chief Complaint  Patient presents with   Fever    Pt BIBA for DIB since last night. His hospice RN called EMS fo a fever of 103.8 at home. Pt has h/o COPD and wears 3L baseline.    HPI: Patrick Montoya is a 81 y.o. 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  Patient is on hospice with a terminal diagnosis of COPD. Patient states that he has had fever and chills for about 3 days associated with a cough productive of dark-colored phlegm and worsening shortness of breath from his baseline. He denies having any sick contacts.  He has had some difficulty with swallowing mostly with solids and states that he knows to chew his food completely and to swallow small portions. He notes that he has had palpitations but denies having any chest pain, no nausea, no vomiting, no changes in his bowel habits,, no leg swelling, no urinary symptoms, no dizziness or lightheadedness. Per EMS he had diffuse wheezes when they arrived and he received a dose of Solu-Medrol IV. In the ER he was noted to be in rapid A-fib and received a bolus of diltiazem and was then started on a drip which was subsequently discontinued because of hypotension.  He is currently on 6 L of oxygen above his baseline of 3 L. He received 1 L IV fluid bolus prior to his admission and his systolic blood pressure is in the 70s with a MAP of 75.  He also received cefepime and vancomycin He is currently receiving another liter of IV fluid. He will be admitted to the hospital for further evaluation.   Review of Systems: As mentioned in the history of  present illness. All other systems reviewed and are negative. Past Medical History:  Diagnosis Date   Basal cell carcinoma 07/25/2015   L zygoma inf to lat canthus   Basal cell carcinoma 06/11/2018   L nasal ala   Basal cell carcinoma 10/04/2019   Left nose. Infiltrative pattern.   BPH (benign prostatic hyperplasia)    COPD (chronic obstructive pulmonary disease) (HCC)    Elevated liver enzymes    Emphysema of lung (HCC)    Gastric ulcer    GERD (gastroesophageal reflux disease)    Hepatitis B    Hypertension    Parkinson's disease    Prostate hypertrophy    Rotator cuff tear right   UTI (urinary tract infection)    Past Surgical History:  Procedure Laterality Date   CHOLECYSTECTOMY     COLONOSCOPY WITH PROPOFOL N/A 10/20/2015   Procedure: COLONOSCOPY WITH PROPOFOL;  Surgeon: Manya Silvas, MD;  Location: [facility];  Service: Endoscopy;  Laterality: N/A;   COLONOSCOPY WITH PROPOFOL N/A 01/24/2021   Procedure: COLONOSCOPY WITH PROPOFOL;  Surgeon: Toledo, Benay Pike, MD;  Location: [facility] ENDOSCOPY;  Service: Gastroenterology;  Laterality: N/A;   ESOPHAGOGASTRODUODENOSCOPY (EGD) WITH PROPOFOL N/A 10/20/2015   Procedure: ESOPHAGOGASTRODUODENOSCOPY (EGD) WITH PROPOFOL;  Surgeon: Manya Silvas, MD;  Location: [facility] ENDOSCOPY;  Service: Endoscopy;  Laterality: N/A;   GREEN LIGHT LASER TURP (TRANSURETHRAL RESECTION OF PROSTATE N/A 11/17/2018   Procedure: GREEN LIGHT LASER  TURP (TRANSURETHRAL RESECTION OF PROSTATE;  Surgeon: Royston Cowper, MD;  Location: [facility] ORS;  Service: Urology;  Laterality: N/A;   INGUINAL HERNIA REPAIR Right 02/18/2019   Procedure: HERNIA REPAIR INGUINAL ADULT;  Surgeon: Royston Cowper, MD;  Location: [facility] ORS;  Service: Urology;  Laterality: Right;   INSERTION OF MESH Right 02/18/2019   Procedure: INSERTION OF MESH;  Surgeon: Royston Cowper, MD;  Location: [facility] ORS;  Service: Urology;  Laterality: Right;   TONSILLECTOMY     TRANSURETHRAL MICROWAVE THERAPY      Social History:  reports that he has quit smoking. His smoking use included cigarettes. He has a 62.00 pack-year smoking history. He has never used smokeless tobacco. He reports current alcohol use. He reports that he does not use drugs.  No Known Allergies  Family History  Problem Relation Age of Onset   Colon cancer Mother    Hepatitis Son    Prostate cancer Neg Hx    Kidney cancer Neg Hx     Prior to Admission medications   Medication Sig Start Date End Date Taking? Authorizing Provider  albuterol (VENTOLIN HFA) 108 (90 Base) MCG/ACT inhaler Inhale 2 puffs into the lungs every 6 (six) hours as needed for wheezing or shortness of breath. 07/25/21 07/25/22  Ghimire, Henreitta Leber, MD  ALPRAZolam Duanne Moron) 0.5 MG tablet Take 0.5 mg by mouth at bedtime as needed for anxiety or sleep.    [provider]  budesonide-formoterol (SYMBICORT) 160-4.5 MCG/ACT inhaler Inhale 2 puffs into the lungs 2 (two) times daily. 07/25/21   Ghimire, Henreitta Leber, MD  carbidopa-levodopa (SINEMET IR) 25-100 MG tablet Take 2 tablets by mouth 5 (five) times daily.    [provider]  cefUROXime (CEFTIN) 250 MG tablet Take 250 mg by mouth 2 (two) times daily. 10/04/21   [provider]  Cyanocobalamin ([employer] VITAMIN B-12) 2000 MCG TBCR Take 2,000 mcg by mouth daily.    [provider]  doxazosin (CARDURA) 4 MG tablet Take 4 mg by mouth daily.     [provider]  erythromycin ophthalmic ointment APPLY A SMALL AMOUNT INTO LEFT EYE AT BEDTIME 09/24/21   [provider]  ferrous gluconate (FERGON) 324 MG tablet Take 324 mg by mouth daily with breakfast.    [provider]  ipratropium-albuterol (DUONEB) 0.5-2.5 (3) MG/3ML SOLN SMARTSIG:3 Milliliter(s) Via Nebulizer Every 6 Hours PRN 02/03/22   [provider]  metoprolol succinate (TOPROL-XL) 25 MG 24 hr tablet Take 25 mg by mouth 2 (two) times daily. 02/19/22   [provider]  metoprolol succinate  (TOPROL-XL) 50 MG 24 hr tablet Take 1 tablet (50 mg total) by mouth at bedtime. Take with or immediately following a meal. 09/19/21   Vickie Epley, MD  montelukast (SINGULAIR) 10 MG tablet Take 10 mg by mouth at bedtime.    [provider]  omeprazole (PRILOSEC) 40 MG capsule Take 40 mg by mouth daily.  11/06/15   [provider]  QUEtiapine (SEROQUEL) 50 MG tablet Take 50 mg by mouth at bedtime.    [provider]  rizatriptan (MAXALT) 10 MG tablet Take 10 mg by mouth as needed for migraine. May repeat in 2 hours if needed    [provider]  SPIRIVA RESPIMAT 2.5 MCG/ACT AERS Inhale 2 puffs into the lungs daily. 12/05/21   [provider]  SUMAtriptan (IMITREX) 100 MG tablet Take by mouth. 11/15/21 11/15/22  [provider]  tiotropium (SPIRIVA) 18 MCG inhalation capsule  Place 1 capsule (18 mcg total) into inhaler and inhale daily. 07/25/21   Jonetta Osgood, MD  warfarin (COUMADIN) 2.5 MG tablet  09/24/21   [provider]    Physical Exam: Vitals:   02/20/22 1045 02/20/22 1100 02/20/22 1130 02/20/22 1223  BP: '90/61 97/69 92/64 '$ 90/64  Pulse: (!) 132 (!) 134 (!) 103 (!) 137  Resp: 16 (!) 33 (!) 27 (!) 27  Temp:      TempSrc:      SpO2: 93% 92% 94% 94%   Physical Exam Vitals and nursing note reviewed.  Constitutional:      Appearance: He is ill-appearing and diaphoretic.     Comments: Acutely ill-appearing  HENT:     Head: Normocephalic.     Nose:     Comments: Nasal cannula    Mouth/Throat:     Mouth: Mucous membranes are moist.  Eyes:     Comments: Pale conjunctiva  Cardiovascular:     Rate and Rhythm: Tachycardia present. Rhythm irregular.  Pulmonary:     Breath sounds: Wheezing present.     Comments: Tachypnea Abdominal:     General: Abdomen is flat. Bowel sounds are normal.     Palpations: Abdomen is soft.  Musculoskeletal:        General: Normal range of motion.     Cervical back: Normal range of motion  and neck supple.  Skin:    General: Skin is warm.  Neurological:     Mental Status: He is alert.     Motor: Weakness present.  Psychiatric:        Mood and Affect: Mood normal.        Behavior: Behavior normal.     Data Reviewed: Relevant notes from primary care and specialist visits, past discharge summaries as available in EHR, including Care Everywhere. Prior diagnostic testing as pertinent to current admission diagnoses Updated medications and problem lists for reconciliation ED course, including vitals, labs, imaging, treatment and response to treatment Triage notes, nursing and pharmacy notes and ED provider's notes Notable results as noted in HPI Labs reviewed.  Troponin 66, lactic acid 2.1, sodium 142, potassium 3.3, chloride 106, bicarb 28, glucose 130, BUN 24, creatinine 0.91, calcium 8.1, total protein 5.7, albumin 2.8, AST 112, ALT 46, alkaline phosphatase 60, total bilirubin 1.2, white count 6.9, hemoglobin 12.5, hematocrit 39.8, platelet count 107, PT 25.4, INR 2.3 Respiratory viral panel is negative Chest x-ray reviewed by me shows Right lower lung pneumonia. Diffuse interstitial opacities could reflect a degree of interstitial edema. CT angiogram of the chest shows no evidence of acute pulmonary embolism or other acute vascular findings in the chest. Patchy airspace opacities throughout the right lung consistent with multilobar pneumonia. The airspace opacities are predominantly dependent in location and could be secondary to aspiration. Aortic Atherosclerosis and Emphysema Twelve-lead EKG reviewed by me shows A-fib with rapid ventricular rate There are no new results to review at this time.  Assessment and Plan: * Sepsis ([place]) As evidenced by tachycardia, tachypnea, lactic acidosis, CT scan findings suggestive of multifocal pneumonia and acute on chronic respiratory failure as patient currently requires 6 L of oxygen above his baseline of 3 L and is tachypneic with  increased work of breathing. Aggressive IV fluid resuscitation Place patient on IV Zosyn as prior sputum culture yielded ESBL E. coli Strict aspiration precautions Speech therapy consult for swallow function evaluation Follow-up results of blood cultures  Multifocal pneumonia Suspect aspiration pneumonia in a patient with a history of  Parkinson's disease and possible dysphagia. Continue antibiotic therapy with IV Zosyn Speech therapy for swallow function evaluation  Acute on chronic respiratory failure with hypoxia (HCC) Secondary to multifocal pneumonia At baseline patient wears 3 L of oxygen but is currently on 6 L with tachypnea and increased work of breathing due to his acute infection. We will attempt to wean patient off oxygen once his acute illness improves or resolves.  Parkinson's disease Continue Sinemet  Hypokalemia Supplement potassium Check magnesium levels  HTN (hypertension) Hold metoprolol for now due to hypotension  Rapid atrial fibrillation [facility]) Patient has a known history of A-fib and is currently in a rapid ventricular rate. He is hypotensive and unable to receive metoprolol or Cardizem at this time. We will request cardiology consult Continue IV fluid resuscitation Patient is on warfarin as primary prophylaxis for an acute stroke  Long term (current) use of anticoagulants Pharmacy consult for warfarin dosing during this hospitalization. Check daily PT/INR  Chronic respiratory failure with hypoxia (HCC) Patient has a history of COPD and at baseline wears 3 L of oxygen continuous  COPD (chronic obstructive pulmonary disease) (HCC) Continue as needed bronchodilator therapy as well as inhaled steroids      Advance Care Planning:   Code Status: DNR   Consults: Cardiology  Family Communication: Greater than 50% of time was spent discussing patient's condition and plan of care with him at the bedside.  All questions and concerns have been addressed.   He verbalizes understanding and agrees with the plan.  He lists his wife as his healthcare power of attorney.  CODE STATUS was discussed and he wishes to be a DNR.  Severity of Illness: The appropriate patient status for this patient is INPATIENT. Inpatient status is judged to be reasonable and necessary in order to provide the required intensity of service to ensure the patient's safety. The patient's presenting symptoms, physical exam findings, and initial radiographic and laboratory data in the context of their chronic comorbidities is felt to place them at high risk for further clinical deterioration. Furthermore, it is not anticipated that the patient will be medically stable for discharge from the hospital within 2 midnights of admission.   * I certify that at the point of admission it is my clinical judgment that the patient will require inpatient hospital care spanning beyond 2 midnights from the point of admission due to high intensity of service, high risk for further deterioration and high frequency of surveillance required.*  Author: Collier Bullock, MD 02/20/2022 1:53 PM  For on call review [email or web address].

## 2022-02-20 NOTE — ED Notes (Signed)
Wrong pt labels sent to lab with pt's initial blood work and cultures. Lab notified, repeat labs and cultures sent at 1030.

## 2022-02-20 NOTE — ED Provider Notes (Signed)
[facility] Provider Note    Event Date/Time   First MD Initiated Contact with Patient 02/20/22 [phone]     (approximate)   History   Chief Complaint: Shortness of breath  HPI  Patrick Montoya is a 81 y.o. male with a history of COPD on 3 L nasal cannula at home, hypertension, BPH, GERD who comes the ED today due to shortness of breath that is been worsening since last night.  Also had a fever of 103 at home.  He has productive cough and diminished breath sounds on the right side per EMS.  EMS gave 125 mg Solu-Medrol IV and 2 albuterol nebs prior to arrival.  Patient denies chest pain.     Physical Exam   Triage Vital Signs: ED Triage Vitals  Enc Vitals Group     BP      Pulse      Resp      Temp      Temp src      SpO2      Weight      Height      Head Circumference      Peak Flow      Pain Score      Pain Loc      Pain Edu?      Excl. in [place]?     Most recent vital signs: Vitals:   02/20/22 1045 02/20/22 1100  BP: 90/61 97/69  Pulse: (!) 132 (!) 134  Resp: 16 (!) 33  Temp:    SpO2: 93% 92%    General: Awake, ill-appearing.  CV:  Good peripheral perfusion.  Tachycardia, heart rate of 180.  Symmetric distal pulses.  Irregular rhythm. Resp:  Tachypnea and accessory muscle use.  Diffuse expiratory wheezing and prolonged expiratory phase.  Diminished breath sounds on the right base. Abd:  No distention.  Soft nontender Other:  No lower extremity edema or calf tenderness.   ED Results / Procedures / Treatments   Labs (all labs ordered are listed, but only abnormal results are displayed) Labs Reviewed  LACTIC ACID, PLASMA - Abnormal; Notable for the following components:      Result Value   Lactic Acid, Venous 2.1 (*)    All other components within normal limits  COMPREHENSIVE METABOLIC PANEL - Abnormal; Notable for the following components:   Potassium 3.3 (*)    Glucose, Bld 130 (*)    BUN 24 (*)    Calcium 8.1 (*)    Total  Protein 5.7 (*)    Albumin 2.8 (*)    AST 112 (*)    ALT 46 (*)    All other components within normal limits  CBC WITH DIFFERENTIAL/PLATELET - Abnormal; Notable for the following components:   RBC 3.91 (*)    Hemoglobin 12.5 (*)    MCV 101.8 (*)    RDW 15.7 (*)    Platelets 107 (*)    Lymphs Abs 0.5 (*)    All other components within normal limits  PROTIME-INR - Abnormal; Notable for the following components:   Prothrombin Time 25.4 (*)    INR 2.3 (*)    All other components within normal limits  APTT - Abnormal; Notable for the following components:   aPTT 55 (*)    All other components within normal limits  BLOOD GAS, VENOUS - Abnormal; Notable for the following components:   pO2, Ven 61 (*)    Bicarbonate 30.1 (*)    Acid-Base Excess 3.6 (*)  All other components within normal limits  RESP PANEL BY RT-PCR (FLU A&B, COVID) ARPGX2  CULTURE, BLOOD (ROUTINE X 2)  CULTURE, BLOOD (ROUTINE X 2)  URINE CULTURE  LACTIC ACID, PLASMA  URINALYSIS, COMPLETE (UACMP) WITH MICROSCOPIC     EKG Interpreted by me Atrial fibrillation with RVR, rate of 176.  Normal axis, poor R wave progression.  Normal ST segments and T waves.   RADIOLOGY Chest x-ray interpreted by me, shows right lower lobe consolidation.  Radiology report reviewed   PROCEDURES:  .Critical Care  Performed by: Carrie Mew, MD Authorized by: Carrie Mew, MD   Critical care provider statement:    Critical care time (minutes):  35   Critical care time was exclusive of:  Separately billable procedures and treating other patients   Critical care was necessary to treat or prevent imminent or life-threatening deterioration of the following conditions:  Sepsis and respiratory failure   Critical care was time spent personally by me on the following activities:  Development of treatment plan with patient or surrogate, discussions with consultants, evaluation of patient's response to treatment, examination of  patient, obtaining history from patient or surrogate, ordering and performing treatments and interventions, ordering and review of laboratory studies, ordering and review of radiographic studies, pulse oximetry, re-evaluation of patient's condition and review of old charts   Care discussed with: admitting provider   Comments:        .1-3 Lead EKG Interpretation  Performed by: Carrie Mew, MD Authorized by: Carrie Mew, MD     Interpretation: abnormal     ECG rate:  170   ECG rate assessment: tachycardic     Rhythm: atrial fibrillation     Ectopy: none     Conduction: normal   Comments:           MEDICATIONS ORDERED IN ED: Medications  vancomycin (VANCOCIN) IVPB 1000 mg/200 mL premix (1,000 mg Intravenous New Bag/Given 02/20/22 1048)  diltiazem (CARDIZEM) 125 mg in dextrose 5% 125 mL (1 mg/mL) infusion (0 mg/hr Intravenous Stopped 02/20/22 1034)  lactated ringers bolus 1,000 mL (1,000 mLs Intravenous New Bag/Given 02/20/22 0946)  diltiazem (CARDIZEM) injection 20 mg (20 mg Intravenous Given 02/20/22 0946)  ceFEPIme (MAXIPIME) 2 g in sodium chloride 0.9 % 100 mL IVPB (0 g Intravenous Stopped 02/20/22 1044)  magnesium sulfate IVPB 2 g 50 mL (0 g Intravenous Stopped 02/20/22 1116)  ipratropium-albuterol (DUONEB) 0.5-2.5 (3) MG/3ML nebulizer solution 3 mL (3 mLs Nebulization Given 02/20/22 1011)     IMPRESSION / MDM / [place] / ED COURSE  I reviewed the triage vital signs and the nursing notes.                              Differential diagnosis includes, but is not limited to, pneumonia, pleural effusion, pulmonary edema, pneumothorax, sepsis, less likely PE NSTEMI pericardial effusion  Patient's presentation is most consistent with acute presentation with potential threat to life or bodily function.  Patient presents with fever tachycardia tachypnea hypoxia, most concerning for pneumonia and sepsis.  Broad work-up chest x-ray ordered, cefepime and  vancomycin ordered to treat for HCAP.  We will do trial of diltiazem bolus for rate control.   Clinical Course as of 02/20/22 1127  Wed Feb 20, 2022  1001 HR improved to 120's after dilt. Bolus. Bp stable. SO2 88-93% on 6L. Still very wheezy with prolonged Exp phase. Will start dilt infusion, mag bolus, additional duoneb.  [  PS]    Clinical Course User Index [PS] Carrie Mew, MD    ----------------------------------------- 11:26 AM on 02/20/2022 ----------------------------------------- Chest x-ray confirms pneumonia.  Lactate of 2.1.  Diltiazem infusion started for rate control of his A-fib with RVR.  Blood pressure remained stable   FINAL CLINICAL IMPRESSION(S) / ED DIAGNOSES   Final diagnoses:  Acute on chronic respiratory failure with hypoxia (HCC)  COPD exacerbation (HCC)  Pneumonia of right lower lobe due to infectious organism  Sepsis with acute hypoxic respiratory failure without septic shock, due to unspecified organism [facility])     Rx / DC Orders   ED Discharge Orders     None        Note:  This document was prepared using Dragon voice recognition software and may include unintentional dictation errors.   Carrie Mew, MD 02/20/22 [phone]

## 2022-02-20 NOTE — Assessment & Plan Note (Signed)
Secondary to multifocal pneumonia At baseline patient wears 3 L of oxygen but is currently on 6 L with tachypnea and increased work of breathing due to his acute infection. We will attempt to wean patient off oxygen once his acute illness improves or resolves.

## 2022-02-20 NOTE — Assessment & Plan Note (Signed)
Pharmacy consult for warfarin dosing during this hospitalization. Check daily PT/INR

## 2022-02-20 NOTE — Assessment & Plan Note (Signed)
Continue as needed bronchodilator therapy as well as inhaled steroids 

## 2022-02-20 NOTE — Sepsis Progress Note (Signed)
ELink tracking the Code Sepsis. 

## 2022-02-20 NOTE — Consult Note (Signed)
Pharmacy Antibiotic Note  Patrick Montoya is a 81 y.o. male admitted on 02/20/2022 with  aspiration pneumonia .  Pharmacy has been consulted for Zosyn dosing.  Assessment: 81 yo M with PMH terminal COPD (3 L [place] @ home), HTN, BPH, GERD presenting with worsening SOB. Admitted to hospice 01/19/2022 with dx terminal COPD. Pt currently afebrile with no leukocytosis, but tachycardic, tachypneic, and hypotensive. Lactic acid 2.2. Currently on 6 L [place]. Imaging shows multilobar pneumonia, potentially secondary to aspiration. RVP negative. Bcx and Ucx collected. Pt with h/o ESBL in sputum in 07/2021, that was Unasyn(R) but Zosyn(S). Pt received vancomycin 1 g IV x 1 and cefepime 2 g IV x 1 in the ED. Renal function is stable.  Plan: Initiate Zosyn 3.375 g IV q8H Follow up culture results for potential antimicrobial de-escalation Monitor renal function to assess for any necessary changes in antibiotic regimen   Temp (24hrs), Avg:98.3 F (36.8 C), Min:97.7 F (36.5 C), Max:98.9 F (37.2 C)  Recent Labs  Lab 02/20/22 1030 02/20/22 1250  WBC 6.9  --   CREATININE 0.91  --   LATICACIDVEN 2.1* 2.2*    CrCl cannot be calculated (Unknown ideal weight.).    No Known Allergies  Antimicrobials this admission: Vancomycin 10/11 x 1 Cefepime 10/11 x 1 Zosyn 10/11 >>  Dose adjustments this admission: N/A  Microbiology results: 10/11 BCx: collected 10/11 UCx: collected   Thank you for allowing pharmacy to be a part of this patient's care.  Dara Hoyer, PharmD PGY-1 Pharmacy Resident 02/20/2022 2:54 PM

## 2022-02-20 NOTE — Assessment & Plan Note (Addendum)
Suspect aspiration pneumonia in a patient with a history of Parkinson's disease and possible dysphagia. Continue antibiotic therapy with IV Zosyn Speech therapy for swallow function evaluation

## 2022-02-20 NOTE — Assessment & Plan Note (Addendum)
As evidenced by tachycardia, tachypnea, lactic acidosis, CT scan findings suggestive of multifocal pneumonia and acute on chronic respiratory failure as patient currently requires 6 L of oxygen above his baseline of 3 L and is tachypneic with increased work of breathing. Aggressive IV fluid resuscitation Place patient on IV Zosyn as prior sputum culture yielded ESBL E. coli Strict aspiration precautions Speech therapy consult for swallow function evaluation Follow-up results of blood cultures

## 2022-02-20 NOTE — Consult Note (Signed)
ANTICOAGULATION CONSULT NOTE - Initial Consult  Pharmacy Consult for warfarin Indication: atrial fibrillation  No Known Allergies  Patient Measurements:    Vital Signs: Temp: 98.9 F (37.2 C) (10/11 0945) Temp Source: Oral (10/11 0945) BP: 90/64 (10/11 1223) Pulse Rate: 137 (10/11 1223)  Labs: Recent Labs    02/20/22 1030  HGB 12.5*  HCT 39.8  PLT 107*  APTT 55*  LABPROT 25.4*  INR 2.3*  CREATININE 0.91  TROPONINIHS 66*    CrCl cannot be calculated (Unknown ideal weight.).  Medical History: Past Medical History:  Diagnosis Date   Basal cell carcinoma 07/25/2015   L zygoma inf to lat canthus   Basal cell carcinoma 06/11/2018   L nasal ala   Basal cell carcinoma 10/04/2019   Left nose. Infiltrative pattern.   BPH (benign prostatic hyperplasia)    COPD (chronic obstructive pulmonary disease) (HCC)    Elevated liver enzymes    Emphysema of lung (HCC)    Gastric ulcer    GERD (gastroesophageal reflux disease)    Hepatitis B    Hypertension    Parkinson's disease    Prostate hypertrophy    Rotator cuff tear right   UTI (urinary tract infection)     Medications:  Warfarin 2.5 mg daily (last PTA dose 10/10 @ 1600) Total weekly dose: 17.5 mg  Assessment: 81 yo M with PMH terminal COPD (3 L [place] @ home), Afib, HTN, BPH, GERD presenting with worsening SOB. Admitted to hospice 01/19/2022 with dx terminal COPD. Patient will be initiated on Zosyn for aspiration pneumonia. Due to difficulty affording DOACs, pt takes warfarin 2.5 mg daily for atrial fibrillation. Goal INR 2-3. Pt currently in RVR and will be loaded on amiodarone for rate and rhythm control.  DDIs with warfarin: Amiodarone (enhanced AC effect) Zosyn (enhanced AC effect) Quetiapine (enhanced AC effect)  Date INR Warfarin Dose   Baseline Labs: aPTT - 55; INR - 2.3 Hgb - 12.5; Plts - 107  Goal of Therapy:  INR 2-3 Monitor platelets by anticoagulation protocol: Yes   Plan: Due to starting  amiodarone load, empirically dose reduce warfarin by 40% Give warfarin 1.5 mg today Monitor INR daily Monitor CBC at least every 7 days while on warfarin  Dara Hoyer, PharmD PGY-1 Pharmacy Resident 02/20/2022 2:26 PM

## 2022-02-20 NOTE — Assessment & Plan Note (Signed)
Continue Sinemet ?

## 2022-02-20 NOTE — ED Notes (Signed)
Lab called with critical lactic of 2.1 taken by this RN. Primary RN Caryl Pina and EDP Joni Fears alerted.

## 2022-02-20 NOTE — Consult Note (Signed)
PHARMACY -  BRIEF ANTIBIOTIC NOTE   Pharmacy has received consult(s) for code sepsis from an ED provider.  The patient's profile has been reviewed for ht/wt/allergies/indication/available labs.    One time order(s) placed for cefepime 2 g and vancomycin 1000 mg  Further antibiotics/pharmacy consults should be ordered by admitting physician if indicated.                       Thank you, Delena Bali 02/20/2022  9:45 AM

## 2022-02-20 NOTE — Progress Notes (Addendum)
[facility] ED [address] [facility])    Hospice hospital liaison note  This patient is a current hospice patient with [facility], admitted 9.9.23 with a terminal diagnosis of COPD.     [facility] will continue to follow for any discharge planning needs and to coordinate continuation of hospice care.   Please don't hesitate to call with any Hospice related questions or concerns.    Thank you for the opportunity to participate in this patient's care. Jhonnie Garner, Therapist, sports, BSN, [facility] Liaison [phone]

## 2022-02-20 NOTE — Assessment & Plan Note (Signed)
Supplement potassium Check magnesium levels 

## 2022-02-20 NOTE — Assessment & Plan Note (Signed)
Patient has a known history of A-fib and is currently in a rapid ventricular rate. He is hypotensive and unable to receive metoprolol or Cardizem at this time. We will request cardiology consult Continue IV fluid resuscitation Patient is on warfarin as primary prophylaxis for an acute stroke

## 2022-02-20 NOTE — Assessment & Plan Note (Signed)
Hold metoprolol for now due to hypotension

## 2022-02-20 NOTE — Consult Note (Signed)
CODE SEPSIS - PHARMACY COMMUNICATION  **Broad Spectrum Antibiotics should be administered within 1 hour of Sepsis diagnosis**  Time Code Sepsis Called/Page Received: 0945  Antibiotics Ordered: cefepime 2 g and vancomycin 1000 mg  Time of 1st antibiotic administration: 9532  Additional action taken by pharmacy: N/A  If necessary, Name of Provider/Nurse Contacted: N/A  Dara Hoyer, PharmD PGY-1 Pharmacy Resident 02/20/2022 9:49 AM

## 2022-02-20 NOTE — Assessment & Plan Note (Signed)
Patient has a history of COPD and at baseline wears 3 L of oxygen continuous

## 2022-02-21 ENCOUNTER — Inpatient Hospital Stay
Admit: 2022-02-21 | Discharge: 2022-02-21 | Disposition: A | Discharge status: Home / Self Care | Attending: Cardiology | Admitting: Cardiology

## 2022-02-21 DIAGNOSIS — J9621 Acute and chronic respiratory failure with hypoxia: Secondary | ICD-10-CM

## 2022-02-21 DIAGNOSIS — I4891 Unspecified atrial fibrillation: Secondary | ICD-10-CM

## 2022-02-21 DIAGNOSIS — R6520 Severe sepsis without septic shock: Secondary | ICD-10-CM

## 2022-02-21 DIAGNOSIS — A419 Sepsis, unspecified organism: Secondary | ICD-10-CM | POA: Diagnosis not present

## 2022-02-21 DIAGNOSIS — Z7901 Long term (current) use of anticoagulants: Secondary | ICD-10-CM

## 2022-02-21 DIAGNOSIS — J439 Emphysema, unspecified: Secondary | ICD-10-CM

## 2022-02-21 DIAGNOSIS — J9600 Acute respiratory failure, unspecified whether with hypoxia or hypercapnia: Secondary | ICD-10-CM

## 2022-02-21 LAB — BASIC METABOLIC PANEL
Anion gap: 5 (ref 5–15)
Anion gap: 8 (ref 5–15)
BUN: 19 mg/dL (ref 8–23)
BUN: 21 mg/dL (ref 8–23)
CO2: 28 mmol/L (ref 22–32)
CO2: 27 mmol/L (ref 22–32)
Calcium: 8.4 mg/dL — ABNORMAL LOW (ref 8.9–10.3)
Calcium: 8.5 mg/dL — ABNORMAL LOW (ref 8.9–10.3)
Chloride: 110 mmol/L (ref 98–111)
Chloride: 109 mmol/L (ref 98–111)
Creatinine, Ser: 0.77 mg/dL (ref 0.61–1.24)
Creatinine, Ser: 0.73 mg/dL (ref 0.61–1.24)
GFR, Estimated: >60 mL/min (ref >60)
GFR, Estimated: >60 mL/min (ref >60)
Glucose, Bld: 169 mg/dL — ABNORMAL HIGH (ref 70–99)
Glucose, Bld: 147 mg/dL — ABNORMAL HIGH (ref 70–99)
Potassium: 4.7 mmol/L (ref 3.5–5.1)
Potassium: 4.8 mmol/L (ref 3.5–5.1)
Sodium: 143 mmol/L (ref 135–145)
Sodium: 144 mmol/L (ref 135–145)

## 2022-02-21 LAB — CBC
HCT: 36.5 % — ABNORMAL LOW (ref 39.0–52.0)
Hemoglobin: 11.5 g/dL — ABNORMAL LOW (ref 13.0–17.0)
MCH: 32.5 pg (ref 26.0–34.0)
MCHC: 31.5 g/dL (ref 30.0–36.0)
MCV: 103.1 fL — ABNORMAL HIGH (ref 80.0–100.0)
Platelets: 97 10*3/uL — ABNORMAL LOW (ref 150–400)
RBC: 3.54 MIL/uL — ABNORMAL LOW (ref 4.22–5.81)
RDW: 15.7 % — ABNORMAL HIGH (ref 11.5–15.5)
WBC: 19.9 10*3/uL — ABNORMAL HIGH (ref 4.0–10.5)
nRBC: 0 % (ref 0.0–0.2)

## 2022-02-21 LAB — CORTISOL-AM, BLOOD: Cortisol - AM: 12.6 ug/dL (ref 6.7–22.6)

## 2022-02-21 LAB — ECHOCARDIOGRAM COMPLETE
AR max vel: 1.91 cm2
AV Area VTI: 2.15 cm2
AV Area mean vel: 1.84 cm2
AV Mean grad: 8 mmHg
AV Peak grad: 14.7 mmHg
Ao pk vel: 1.92 m/s
Area-P 1/2: 3.54 cm2
S' Lateral: 2.9 cm
Single Plane A4C EF: 50.8 %

## 2022-02-21 LAB — MAGNESIUM: Magnesium: 2.2 mg/dL (ref 1.7–2.4)

## 2022-02-21 LAB — PROCALCITONIN: Procalcitonin: 9.91 ng/mL

## 2022-02-21 LAB — PROTIME-INR
INR: 2.8 — ABNORMAL HIGH (ref 0.8–1.2)
Prothrombin Time: 29.6 seconds — ABNORMAL HIGH (ref 11.4–15.2)

## 2022-02-21 MED ORDER — AMIODARONE HCL 200 MG PO TABS: 200.0000 mg | ORAL_TABLET | Freq: Two times a day (BID) | ORAL | Status: DC

## 2022-02-21 MED ORDER — AMIODARONE HCL 200 MG PO TABS: 200.0000 mg | ORAL_TABLET | Freq: Every day | ORAL | Status: DC

## 2022-02-21 MED ORDER — LACTATED RINGERS IV SOLN: INTRAVENOUS | Status: DC

## 2022-02-21 MED ORDER — INFLUENZA VAC A&B SA ADJ QUAD 0.5 ML IM PRSY
0.5000 mL | PREFILLED_SYRINGE | INTRAMUSCULAR | Status: AC
  Administered 2022-02-22: 0.5 mL via INTRAMUSCULAR
  Filled 2022-02-21: qty 0.5

## 2022-02-21 NOTE — Progress Notes (Signed)
[place] NOTE       Patient ID: Patrick Montoya MRN: [id number] DOB/AGE: December 25, 1940 81 y.o.  Admit date: 02/20/2022 Referring Physician Dr. Francine Graven Primary Physician Dr. Ginette Pitman Primary Cardiologist Dr. Nehemiah Massed (prev Dr. 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  He was found to be in atrial fibrillation with RVR on admission with heart rates peaking in the 170s, for which cardiology is consulted for further assistance.  Interval History:  -Converted from atrial fibrillation RVR to normal sinus rhythm yesterday afternoon after bolus of amiodarone given -Feels better today with less shortness of breath, still has a productive cough -Marked leukocytosis today and uptrending procalcitonin, speech therapy saw patient and okayed diet.  Review of systems complete and found to be negative unless listed above     Past Medical History:  Diagnosis Date   Basal cell carcinoma 07/25/2015   L zygoma inf to lat canthus   Basal cell carcinoma 06/11/2018   L nasal ala   Basal cell carcinoma 10/04/2019   Left nose. Infiltrative pattern.   BPH (benign prostatic hyperplasia)    COPD (chronic obstructive pulmonary disease) (HCC)    Elevated liver enzymes    Emphysema of lung (HCC)    Gastric ulcer    GERD (gastroesophageal reflux disease)    Hepatitis B    Hypertension    Parkinson's disease    Prostate hypertrophy    Rotator cuff tear right   UTI (urinary tract infection)     Past Surgical History:  Procedure Laterality Date   CHOLECYSTECTOMY     COLONOSCOPY WITH PROPOFOL N/A 10/20/2015   Procedure: COLONOSCOPY WITH PROPOFOL;  Surgeon: Manya Silvas,  MD;  Location: [place];  Service: Endoscopy;  Laterality: N/A;   COLONOSCOPY WITH PROPOFOL N/A 01/24/2021   Procedure: COLONOSCOPY WITH PROPOFOL;  Surgeon: Toledo, Benay Pike, MD;  Location: [facility] ENDOSCOPY;  Service: Gastroenterology;  Laterality: N/A;   ESOPHAGOGASTRODUODENOSCOPY (EGD) WITH PROPOFOL N/A 10/20/2015   Procedure: ESOPHAGOGASTRODUODENOSCOPY (EGD) WITH PROPOFOL;  Surgeon: Manya Silvas, MD;  [facility] END[facility];  Service: Endoscopy;  Laterality: N/A;   GREEN LIGHT LASER TURP (TRANSURETHRAL RESECTION OF PROSTATE N/A 11/17/2018   Procedure: GREEN LIGHT LASER TURP (TRANSURETHRAL RESECTION OF PROSTATE;  Surgeon: Royston Cowper, MD;  Location: [facility] ORS;  Service: Urology;  Laterality: N/A;   INGUINAL HERNIA REPAIR Right 02/18/2019   Procedure: HERNIA REPAIR INGUINAL ADULT;  Surgeon: Royston Cowper, MD;  Location: [facility] ORS;  Service: Urology;  Laterality: Right;   INSERTION OF MESH Right 02/18/2019   Procedure: INSERTION OF MESH;  Surgeon: Royston Cowper, MD;  Location: [facility] ORS;  Service: Urology;  Laterality: Right;   TONSILLECTOMY     TRANSURETHRAL MICROWAVE THERAPY      (Not in a hospital admission)  Social History   Socioeconomic History   Marital status: Married    Spouse name: Not on file   Number of children: Not on file   Years of education: Not on file   Highest education level: Not on file  Occupational History   Not on file  Tobacco Use   Smoking status: Former    Packs/day: 1.00    Years:  62.00    Total pack years: 62.00    Types: Cigarettes   Smokeless tobacco: Never  Vaping Use   Vaping Use: Never used  Substance and Sexual Activity   Alcohol use: Yes    Comment: occasional   Drug use: No   Sexual activity: Not on file  Other Topics Concern   Not on file  Social History Narrative   Not on file   Social Determinants of Health   Financial Resource Strain: Not on file  Food Insecurity: Not on file  Transportation Needs: Not on file   Physical Activity: Not on file  Stress: Not on file  Social Connections: Not on file  Intimate Partner Violence: Not on file    Family History  Problem Relation Age of Onset   Colon cancer Mother    Hepatitis Son    Prostate cancer Neg Hx    Kidney cancer Neg Hx      Vitals:   02/21/22 1200 02/21/22 1215 02/21/22 1230 02/21/22 1245  BP: 120/73 117/73 121/71 117/87  Pulse: 72 64 71 73  Resp: (!) 29 (!) 28 (!) 35 19  Temp:      TempSrc:      SpO2: 98% 98% 98% 98%    PHYSICAL EXAM General: Elderly, acute on chronically ill appearing Caucasian male, in no acute distress.  Sitting upright in ED stretcher with lunch tray in his lap.   HEENT:  Normocephalic and atraumatic. Neck:  No JVD.  Lungs: Normal respiratory effort on 4 L by nasal cannula.  Decreased breath sounds throughout without appreciable crackles or wheezes Heart: Regular rate and rhythm.  Normal S1 and S2 without gallops or murmurs.  Abdomen: Non-distended appearing.  Msk: Normal strength and tone for age. Extremities: Warm and well perfused. No clubbing, cyanosis.  No peripheral edema.  Neuro: Alert and oriented X 3. Psych:  Answers questions appropriately.   Labs: Basic Metabolic Panel: Recent Labs    02/20/22 1030 02/20/22 1457 02/21/22 0444  NA 142  --  143  K 3.3*  --  4.7  CL 109  --  110  CO2 28  --  28  GLUCOSE 130*  --  169*  BUN 24*  --  19  CREATININE 0.91  --  0.77  CALCIUM 8.1*  --  8.4*  MG  --  1.4*  --     Liver Function Tests: Recent Labs    02/20/22 1030  AST 112*  ALT 46*  ALKPHOS 60  BILITOT 1.2  PROT 5.7*  ALBUMIN 2.8*    No results for input(s): "LIPASE", "AMYLASE" in the last 72 hours. CBC: Recent Labs    02/20/22 1030 02/21/22 0444  WBC 6.9 19.9*  NEUTROABS 5.9  --   HGB 12.5* 11.5*  HCT 39.8 36.5*  MCV 101.8* 103.1*  PLT 107* 97*    Cardiac Enzymes: Recent Labs    02/20/22 1030 02/20/22 1457  TROPONINIHS 66* 37*    BNP: No results for input(s):  "BNP" in the last 72 hours. D-Dimer: No results for input(s): "DDIMER" in the last 72 hours. Hemoglobin A1C: No results for input(s): "HGBA1C" in the last 72 hours. Fasting Lipid Panel: No results for input(s): "CHOL", "HDL", "LDLCALC", "TRIG", "CHOLHDL", "LDLDIRECT" in the last 72 hours. Thyroid Function Tests: No results for input(s): "TSH", "T4TOTAL", "T3FREE", "THYROIDAB" in the last 72 hours.  Invalid input(s): "FREET3" Anemia Panel: No results for input(s): "VITAMINB12", "FOLATE", "FERRITIN", "TIBC", "IRON", "RETICCTPCT" in the last 72 hours.  Radiology: ECHOCARDIOGRAM COMPLETE  Result Date: 02/21/2022    ECHOCARDIOGRAM REPORT   Patient Name:   Patrick Montoya Date of Exam: 02/21/2022 Medical Rec #:  [id number]      Height:       69.0 in Accession #:    [id number]     Weight:       175.0 lb Date of Birth:  April 05, 1941     BSA:          1.952 m Patient Age:    64 years       BP:           121/72 mmHg Patient Gender: M              HR:           69 bpm. Exam Location:  [facility] Procedure: 2D Echo, Color Doppler and Cardiac Doppler Indications:     Atrial Fibrillation I48.91  History:         Patient has prior history of Echocardiogram examinations, most                  recent 03/10/2021. COPD; Risk Factors:Hypertension.  Sonographer:     Sherrie Sport Referring Phys:  [id number] Buckner Kura Bethards Diagnosing Phys: Yolonda Kida MD  Sonographer Comments: No parasternal window. IMPRESSIONS  1. Left ventricular ejection fraction, by estimation, is 50 to 55%. The left ventricle has low normal function. The left ventricle has no regional wall motion abnormalities. There is mild concentric left ventricular hypertrophy. Left ventricular diastolic parameters are consistent with Grade I diastolic dysfunction (impaired relaxation).  2. Right ventricular systolic function is normal. The right ventricular size is normal.  3. The mitral valve is normal in structure. Trivial mitral valve regurgitation.  4. The  aortic valve is normal in structure. Aortic valve regurgitation is not visualized. FINDINGS  Left Ventricle: Left ventricular ejection fraction, by estimation, is 50 to 55%. The left ventricle has low normal function. The left ventricle has no regional wall motion abnormalities. The left ventricular internal cavity size was normal in size. There is mild concentric left ventricular hypertrophy. Left ventricular diastolic parameters are consistent with Grade I diastolic dysfunction (impaired relaxation). Right Ventricle: The right ventricular size is normal. No increase in right ventricular wall thickness. Right ventricular systolic function is normal. Left Atrium: Left atrial size was normal in size. Right Atrium: Right atrial size was normal in size. Pericardium: There is no evidence of pericardial effusion. Mitral Valve: The mitral valve is normal in structure. Trivial mitral valve regurgitation. Tricuspid Valve: The tricuspid valve is normal in structure. Tricuspid valve regurgitation is mild. Aortic Valve: The aortic valve is normal in structure. Aortic valve regurgitation is not visualized. Aortic valve mean gradient measures 8.0 mmHg. Aortic valve peak gradient measures 14.7 mmHg. Aortic valve area, by VTI measures 2.15 cm. Pulmonic Valve: The pulmonic valve was normal in structure. Pulmonic valve regurgitation is not visualized. Aorta: The ascending aorta was not well visualized. IAS/Shunts: No atrial level shunt detected by color flow Doppler.  LEFT VENTRICLE PLAX 2D LVIDd:         3.70 cm     Diastology LVIDs:         2.90 cm     LV e' medial:    6.64 cm/s LV PW:         1.40 cm     LV E/e' medial:  13.6 LV IVS:        1.30 cm  LV e' lateral:   9.57 cm/s LVOT diam:     2.10 cm     LV E/e' lateral: 9.4 LV SV:         76 LV SV Index:   39 LVOT Area:     3.46 cm  LV Volumes (MOD) LV vol d, MOD A4C: 83.2 ml LV vol s, MOD A4C: 40.9 ml LV SV MOD A4C:     83.2 ml RIGHT VENTRICLE RV Basal diam:  4.90 cm RV Mid  diam:    4.00 cm RV S prime:     14.50 cm/s TAPSE (M-mode): 2.3 cm LEFT ATRIUM             Index        RIGHT ATRIUM           Index LA diam:        3.20 cm 1.64 cm/m   RA Area:     25.70 cm LA Vol (A2C):   66.3 ml 33.96 ml/m  RA Volume:   93.90 ml  48.10 ml/m LA Vol (A4C):   51.5 ml 26.38 ml/m LA Biplane Vol: 65.0 ml 33.30 ml/m  AORTIC VALVE AV Area (Vmax):    1.91 cm AV Area (Vmean):   1.84 cm AV Area (VTI):     2.15 cm AV Vmax:           192.00 cm/s AV Vmean:          132.000 cm/s AV VTI:            0.353 m AV Peak Grad:      14.7 mmHg AV Mean Grad:      8.0 mmHg LVOT Vmax:         106.00 cm/s LVOT Vmean:        70.300 cm/s LVOT VTI:          0.219 m LVOT/AV VTI ratio: 0.62  AORTA Ao Root diam: 3.00 cm MITRAL VALVE                TRICUSPID VALVE MV Area (PHT): 3.54 cm     TR Peak grad:   20.8 mmHg MV Decel Time: 214 msec     TR Vmax:        228.00 cm/s MV E velocity: 90.10 cm/s MV A velocity: 113.00 cm/s  SHUNTS MV E/A ratio:  0.80         Systemic VTI:  0.22 m                             Systemic Diam: 2.10 cm Yolonda Kida MD Electronically signed by Yolonda Kida MD Signature Date/Time: 02/21/2022/1:11:59 PM    Final    CT Angio Chest PE W and/or Wo Contrast  Result Date: 02/20/2022 CLINICAL DATA:  Shortness of breath since last night. Pulmonary embolism suspected, high probability. History of chronic obstructive pulmonary disease on home oxygen. EXAM: CT ANGIOGRAPHY CHEST WITH CONTRAST TECHNIQUE: Multidetector CT imaging of the chest was performed using the standard protocol during bolus administration of intravenous contrast. Multiplanar CT image reconstructions and MIPs were obtained to evaluate the vascular anatomy. RADIATION DOSE REDUCTION: This exam was performed according to the departmental dose-optimization program which includes automated exposure control, adjustment of the mA and/or kV according to patient size and/or use of iterative reconstruction technique. CONTRAST:  70m  OMNIPAQUE IOHEXOL 350 MG/ML SOLN COMPARISON:  Chest CTA 08/01/2021.  Chest radiographs 02/20/2022. FINDINGS:  Cardiovascular: The pulmonary arteries are well opacified with contrast to the level of the subsegmental branches. There is no evidence of acute pulmonary embolism. Relatively mild atherosclerosis of the aorta, great vessels and coronary arteries. There is limited systemic arterial opacification without evidence of acute vascular abnormality. Possible aortic valvular calcifications. The heart size is normal. A small amount of pericardial fluid appears unchanged. Mediastinum/Nodes: There are no enlarged mediastinal, hilar or axillary lymph nodes.Small mediastinal lymph nodes are unchanged, likely reactive. The thyroid gland, trachea and esophagus demonstrate no significant findings. Lungs/Pleura: Moderate centrilobular and paraseptal emphysema. There are superimposed patchy airspace opacities throughout the right lung consistent with multilobar pneumonia. There is minimal dependent opacity in the left lower lobe. Unchanged small perifissural nodule along the minor fissure on image 89/5. No suspicious pulmonary nodules. Upper abdomen: The visualized upper abdomen appears stable status post cholecystectomy. No acute findings. Musculoskeletal/Chest wall: There is no chest wall mass or suspicious osseous finding. Review of the MIP images confirms the above findings. IMPRESSION: 1. No evidence of acute pulmonary embolism or other acute vascular findings in the chest. 2. Patchy airspace opacities throughout the right lung consistent with multilobar pneumonia. The airspace opacities are predominantly dependent in location and could be secondary to aspiration. Radiographic follow-up recommended to ensure resolution. 3. Aortic Atherosclerosis (ICD10-I70.0) and Emphysema (ICD10-J43.9). Electronically Signed   By: Richardean Sale M.D.   On: 02/20/2022 12:20   DG Chest Port 1 View  Result Date: 02/20/2022 CLINICAL  DATA:  Questionable sepsis - evaluate for abnormality EXAM: PORTABLE CHEST 1 VIEW COMPARISON:  Radiograph 08/01/2021 FINDINGS: Unchanged cardiomediastinal silhouette. Mild diffuse interstitial opacities. There is airspace consolidation in the right lower lung. Trace right pleural effusion. No evidence of pneumothorax. No acute osseous abnormality. Thoracic spondylosis. IMPRESSION: Right lower lung pneumonia. Diffuse interstitial opacities could reflect a degree of interstitial edema. Electronically Signed   By: Maurine Simmering M.D.   On: 02/20/2022 10:18    Lexiscan myoview 06/27/2021 Impression  1. No significant ischemia noted on current study.  2. There is a small size, moderate in intensity defect of the basal septal  segment which is predominantly fixed.  3. Normal LV systolic function, but with TID ratio of 1.23.  Narrative  Table formatting from the original result was not included.  [place]  [facility]  [place], [place], [place]  [address]  [phone]   Procedure: Pharmacologic Myocardial Perfusion Imaging    ONE day procedure   Indication: Paroxysmal A-fib (CMS-HCC)  Plan: NM myocardial perfusion SPECT multiple (stress        and rest), ECG stress test only   Shortness of breath  Plan: NM myocardial perfusion SPECT multiple (stress        and rest), ECG stress test only   Ordering Physician:   Dr. Donnelly Angelica    Clinical History:  81 y.o. year old male  Vitals: Height: 69 in Weight: 170 lb  Cardiac risk factors include:     AFIB and HTN    Procedure:   Pharmacologic stress testing was performed with Regadenoson using a single  use 0.'4mg'$ /38m (0.08 mg/ml) prefilled syringe intravenously infused as a  bolus dose over 10-15 seconds. The stress test was stopped due to Infusion  completion.  Blood pressure response was normal. The patient did not  develop any symptoms other than fatigue during the procedure.   Rest  HR: 71bpm  Rest BP: 150/826mg  Max HR: 88bpm  Min BP:  132/45mHg   Stress Test Administered by: DOswald Hillock CMA   ECG Interpretation:  Rest ECG:  normal sinus rhythm, none  Stress ECG:  normal sinus rhythm, none  Recovery ECG:  normal sinus rhythm  ECG Interpretation:  negative.    Administrations This Visit     regadenoson (LEXISCAN) 0.4 mg/5 mL inj syringe 0.4 mg     Admin Date  06/28/2021 Action  Given Dose  0.4 mg Route  Intravenous Administered By  GHerbert Seta CNMT     technetium Tc968mestamibi (CARDIOLITE) injection 1050.53illicurie     Admin Date  06/28/2021 Action  Given Dose  1097.67illicurie Route  Intravenous Administered By  GoHerbert SetaCNMT     technetium Tc9992mstamibi (CARDIOLITE) injection 31.34.19llicurie     Admin Date  06/28/2021 Action  Given Dose  31.37.90llicurie Route  Intravenous Administered By  MayCherlynn PerchesNMT   Gated post-stress perfusion imaging was performed 30 minutes after stress.  Rest images were performed 30 minutes after injection.   Gated LV Analysis:   Summary of LV Perfusion: Normal,   Summary of LV Function: Normal    TID Ratio: 1.24   LVEF= 58%   FINDINGS:  Regional wall motion:  reveals normal myocardial thickening and wall  motion.  The overall quality of the study is fair.    Artifacts noted: yes  Left ventricular cavity: normal.   ECHO 03/10/2021 IMPRESSIONS     1. Left ventricular ejection fraction, by estimation, is 55 to 60%. The  left ventricle has normal function. The left ventricle has no regional  wall motion abnormalities. Left ventricular diastolic parameters were  normal.   2. Right ventricular systolic function is normal. The right ventricular  size is normal.   3. The mitral valve is normal in structure. Mild mitral valve  regurgitation.   4. The aortic valve is normal in structure. Aortic valve regurgitation is  trivial.   FINDINGS   Left Ventricle: Left  ventricular ejection fraction, by estimation, is 55  to 60%. The left ventricle has normal function. The left ventricle has no  regional wall motion abnormalities. The left ventricular internal cavity  size was normal in size. There is   no left ventricular hypertrophy. Left ventricular diastolic parameters  were normal.   Right Ventricle: The right ventricular size is normal. No increase in  right ventricular wall thickness. Right ventricular systolic function is  normal.   Left Atrium: Left atrial size was normal in size.   Right Atrium: Right atrial size was normal in size.   Pericardium: There is no evidence of pericardial effusion.   Mitral Valve: The mitral valve is normal in structure. Mild mitral valve  regurgitation.   Tricuspid Valve: The tricuspid valve is normal in structure. Tricuspid  valve regurgitation is trivial.   Aortic Valve: The aortic valve is normal in structure. Aortic valve  regurgitation is trivial. Aortic valve peak gradient measures 14.1 mmHg.   Pulmonic Valve: The pulmonic valve was normal in structure. Pulmonic valve  regurgitation is not visualized.   Aorta: The aortic root and ascending aorta are structurally normal, with  no evidence of dilitation.   IAS/Shunts: No atrial level shunt detected by color flow Doppler.      LEFT VENTRICLE  PLAX 2D  LVIDd:         4.10 cm   Diastology  LVIDs:         3.00 cm   LV e' medial:  5.11 cm/s  LV PW:         0.90 cm   LV E/e' medial:  16.3  LV IVS:        1.20 cm   LV e' lateral:   7.51 cm/s  LVOT diam:     1.70 cm   LV E/e' lateral: 11.1  LVOT Area:     2.27 cm      RIGHT VENTRICLE  RV Basal diam:  3.70 cm  RV Mid diam:    3.10 cm  RV S prime:     10.90 cm/s  TAPSE (M-mode): 1.9 cm   LEFT ATRIUM             Index        RIGHT ATRIUM           Index  LA diam:        3.90 cm 2.04 cm/m   RA Area:     16.70 cm  LA Vol (A2C):   50.6 ml 26.53 ml/m  RA Volume:   47.60 ml  24.96 ml/m  LA Vol  (A4C):   65.7 ml 34.45 ml/m  LA Biplane Vol: 59.4 ml 31.14 ml/m   AORTIC VALVE                 PULMONIC VALVE  AV Area (Vmax): 1.39 cm     PV Vmax:        1.14 m/s  AV Vmax:        188.00 cm/s  PV Peak grad:   5.2 mmHg  AV Peak Grad:   14.1 mmHg    RVOT Peak grad: 2 mmHg  LVOT Vmax:      115.00 cm/s     AORTA  Ao Root diam: 2.80 cm   MITRAL VALVE                TRICUSPID VALVE  MV Area (PHT): 3.06 cm     TR Peak grad:   28.9 mmHg  MV Decel Time: 248 msec     TR Vmax:        269.00 cm/s  MV E velocity: 83.40 cm/s  MV A velocity: 102.00 cm/s  SHUNTS  MV E/A ratio:  0.82         Systemic Diam: 1.70 cm  MV A Prime:    10.9 cm/s   Serafina Royals MD  Electronically signed by Serafina Royals MD  Signature Date/Time: 03/12/2021/8:49:16 AM   TELEMETRY reviewed by me (LT) 02/21/2022 : Sinus rhythm rate 60s to 70s.  EKG reviewed by me: Atrial fibrillation with RVR rate 167  Data reviewed by me (LT) 02/21/2022: Nursing notes, hospice note, ordered repeat troponin, CBC, BMP, vitals, telemetry, chest x-ray, ordered mag   Principal Problem:   Sepsis ([place]) Active Problems:   COPD (chronic obstructive pulmonary disease) ([place])   Parkinson's disease   Chronic respiratory failure with hypoxia ([place])   Long term (current) use of anticoagulants   Rapid atrial fibrillation (HCC)   Acute on chronic respiratory failure with hypoxia ([place])   Multifocal pneumonia   HTN (hypertension)   Hypokalemia   Atrial fibrillation (HCC)    ASSESSMENT AND PLAN:  Patrick Montoya is an 54yoM with a past medical history of paroxysmal atrial fibrillation on Coumadin, essential hypertension, 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  He  was found to be in atrial fibrillation with RVR on admission with heart rates peaking in the 170s, for which cardiology is consulted for  further assistance.  #Sepsis secondary to multifocal pneumonia #Chronic respiratory failure/COPD on baseline 3 L -Agree with current therapy per primary team, has been given 2 L of LR, started on broad-spectrum antibiotics -Cleared by speech therapy for diet.  #Pseudomonas cystitis Trace leuks on UA, Confirmed on urine culture, management per primary team.  #Paroxysmal atrial fibrillation with RVR Has had several episodes of this over the past year, usually always in the setting of fever or pneumonia.  Typically responsive to IV calcium channel blockers.  EKG shows atrial fibrillation with rate 168, on telemetry so far he has been consistently 130-140 prior to amiodarone.  He converted to NSR after amiodarone bolus was given and remains in sinus rhythm on 10/12. -Continue to treat underlying causes of increased adrenergic tone, including infection, hypoxia, pain -Monitor and replete electrolytes for goal K >4, mag >2 -S/p IV diltiazem 20 mg x 1, started on a diltiazem infusion which resulted in marked hypotension. -S/p amiodarone bolus with conversion to NSR.  He will complete 24 hours of continuous infusion today. -We will start amiodarone p.o. 200 mg twice daily x5 days, then 200 mg daily thereafter. -Hold home metoprolol XL 25 mg once daily with hypotension -Continue warfarin per pharmacy consult, appreciate their assistance -CHA2DS2-VASc 3 (age, HTN) -Echocardiogram complete resulted with preserved EF 50-55%, g1 dd, no RWMAS  #Elevated troponin Initially 66 on first check, repeat downtrending at 37.  Troponin elevation most consistent with demand/supply mismatch in the setting of sepsis and tachycardia and not ACS   This patient's plan of care was discussed and created with Dr. Clayborn Bigness and he is in agreement.  Signed: Tristan Schroeder , PA-C 02/21/2022, 1:16 PM [facility]

## 2022-02-21 NOTE — Progress Notes (Addendum)
[facility]  PROGRESS NOTE  Patrick Montoya:[id number] DOB: July 19, 1940 DOA: 02/20/2022 PCP: Tracie Harrier, MD   Brief HPI:   81 year old male with past medical history of paroxysmal atrial fibrillation on anticoagulant Coumadin, essential hypertension, COPD on 3 days of oxygen under hospice care, Parkinson disease, BPH came to ED on 02/20/2022 with worsening shortness of breath, fever of 103 F productive cough.  He was found to be in atrial fibrillation with RVR.  Patient states that he had fever and chills for about 3 days, was associated with cough productive of dark-colored phlegm and worsening shortness of breath from baseline.  Chest x-ray showed right lower lobe pneumonia CT angiogram of the chest showed multifocal pneumonia Patient started on IV Zosyn due to prior history of ESBL E. coli in sputum culture    Subjective   Patient seen and examined, denies shortness of breath.   Assessment/Plan:     Sepsis -Presented with tachycardia, tachypnea, lactic acidosis -CT scan findings suggestive of multifocal pneumonia acute on chronic respiratory failure -Patient started on IV Zosyn; due to prior history of sputum culture yielding ESBL E. Coli  Multifocal pneumonia -Likely aspiration pneumonia in the setting of Parkinson disease with possible dysphagia -Started on IV antibiotics as above -Speech therapy consulted for swallow evaluation  Acute on chronic respiratory failure with hypoxemia -Secondary to multifocal pneumonia -Patient wears oxygen at baseline 3 L/min -Currently requiring 4 L/min of oxygen, nearing his baseline  Atrial fibrillation with RVR -Patient was hypotensive so did not receive metoprolol or Cardizem -Cardiology was consulted -Started on amiodarone -Continue anticoagulation with Coumadin  Parkinson disease -Continue Sinemet  Hypokalemia -Replete  Hypertension -Metoprolol on hold due to hypotension -  COPD -Continue  bronchodilator therapy as well as inhaled steroids   Medications     carbidopa-levodopa  2 tablet Oral 5 X Daily   cyanocobalamin  2,000 mcg Oral Daily   ferrous gluconate  324 mg Oral Q breakfast   mometasone-formoterol  2 puff Inhalation BID   montelukast  10 mg Oral QHS   pantoprazole  40 mg Oral Daily   QUEtiapine  50 mg Oral QHS   Warfarin - Pharmacist Dosing Inpatient   Does not apply q1600     Data Reviewed:   CBG:  No results for input(s): "GLUCAP" in the last 168 hours.  SpO2: 98 % O2 Flow Rate (L/min): 4 L/min    Vitals:   02/21/22 0400 02/21/22 0530 02/21/22 0707 02/21/22 0715  BP: 118/63 122/71  121/72  Pulse: 64 75  69  Resp: (!) 22 (!) 25  20  Temp:   98 F (36.7 C)   TempSrc:   Oral   SpO2: 99% 95%  98%      Data Reviewed:  Basic Metabolic Panel: Recent Labs  Lab 02/20/22 1030 02/20/22 1457 02/21/22 0444  NA 142  --  143  K 3.3*  --  4.7  CL 109  --  110  CO2 28  --  28  GLUCOSE 130*  --  169*  BUN 24*  --  19  CREATININE 0.91  --  0.77  CALCIUM 8.1*  --  8.4*  MG  --  1.4*  --     CBC: Recent Labs  Lab 02/20/22 1030 02/21/22 0444  WBC 6.9 19.9*  NEUTROABS 5.9  --   HGB 12.5* 11.5*  HCT 39.8 36.5*  MCV 101.8* 103.1*  PLT 107* 97*    LFT Recent Labs  Lab 02/20/22 1030  AST 112*  ALT 46*  ALKPHOS 60  BILITOT 1.2  PROT 5.7*  ALBUMIN 2.8*     Antibiotics: Anti-infectives (From admission, onward)    Start     Dose/Rate Route Frequency Ordered Stop   02/20/22 1500  piperacillin-tazobactam (ZOSYN) IVPB 3.375 g        3.375 g 12.5 mL/hr over 240 Minutes Intravenous Every 8 hours 02/20/22 1404     02/20/22 0945  vancomycin (VANCOCIN) IVPB 1000 mg/200 mL premix        1,000 mg 200 mL/hr over 60 Minutes Intravenous  Once 02/20/22 0943 02/20/22 1148   02/20/22 0945  ceFEPIme (MAXIPIME) 2 g in sodium chloride 0.9 % 100 mL IVPB        2 g 200 mL/hr over 30 Minutes Intravenous  Once 02/20/22 0943 02/20/22 1044         DVT prophylaxis: Coumadin  Code Status: DNR  Family Communication: No family at bedside   CONSULTS cardiology   Objective    Physical Examination:  Heart S1-S2, irregular Lungs-clear to auscultation bilaterally Abdomen-soft, nontender, no organomegaly  Status is: Inpatient:             Oswald Hillock   [facility] If 7PM-7AM, please contact night-coverage at [email or web address], Office  [phone]   02/21/2022, 8:22 AM  LOS: 1 day

## 2022-02-21 NOTE — ED Notes (Signed)
SLP at bedside.

## 2022-02-21 NOTE — Consult Note (Signed)
[place] for warfarin Indication: atrial fibrillation  No Known Allergies  Patient Measurements:    Vital Signs: Temp: 97.9 F (36.6 C) (10/12 1050) Temp Source: Oral (10/12 1050) BP: 126/81 (10/12 1315) Pulse Rate: 81 (10/12 1315)  Labs: Recent Labs    02/20/22 1030 02/20/22 1457 02/21/22 0444  HGB 12.5*  --  11.5*  HCT 39.8  --  36.5*  PLT 107*  --  97*  APTT 55*  --   --   LABPROT 25.4*  --  29.6*  INR 2.3*  --  2.8*  CREATININE 0.91  --  0.77  TROPONINIHS 66* 37*  --      CrCl cannot be calculated (Unknown ideal weight.).  Medical History: Past Medical History:  Diagnosis Date   Basal cell carcinoma 07/25/2015   L zygoma inf to lat canthus   Basal cell carcinoma 06/11/2018   L nasal ala   Basal cell carcinoma 10/04/2019   Left nose. Infiltrative pattern.   BPH (benign prostatic hyperplasia)    COPD (chronic obstructive pulmonary disease) (HCC)    Elevated liver enzymes    Emphysema of lung (HCC)    Gastric ulcer    GERD (gastroesophageal reflux disease)    Hepatitis B    Hypertension    Parkinson's disease    Prostate hypertrophy    Rotator cuff tear right   UTI (urinary tract infection)     Medications:  Warfarin 2.5 mg daily (last PTA dose 10/10 @ 1600) Total weekly dose: 17.5 mg  Assessment: 81 yo M with PMH terminal COPD (3 L [place] @ home), Afib, HTN, BPH, GERD presenting with worsening SOB. Admitted to hospice 01/19/2022 with dx terminal COPD. Patient will be initiated on Zosyn for aspiration pneumonia. Due to difficulty affording DOACs, pt takes warfarin 2.5 mg daily for atrial fibrillation. Goal INR 2-3. Pt currently in RVR and will be loaded on amiodarone for rate and rhythm control.  DDIs with warfarin: Amiodarone (enhanced AC effect) Zosyn (enhanced AC effect) Quetiapine (enhanced AC effect)  Date INR Warfarin Dose 10/10  2.5 mg 10/11 2.3 1.5 mg 10/12 2.8 hold  Baseline Labs: aPTT - 55; INR -  2.3 Hgb - 12.5; Plts - 107  Goal of Therapy:  INR 2-3 Monitor platelets by anticoagulation protocol: Yes   Plan: Due to starting amiodarone load, empirically dose reduce warfarin by 40% Hold warfarin dose today Monitor INR daily Monitor CBC at least every 7 days while on warfarin  Lorin Picket  02/21/2022 2:05 PM

## 2022-02-21 NOTE — Progress Notes (Signed)
*  PRELIMINARY RESULTS* Echocardiogram 2D Echocardiogram has been performed.  Patrick Montoya 02/21/2022, 7:40 AM

## 2022-02-21 NOTE — Evaluation (Signed)
Clinical/Bedside Swallow Evaluation Patient Details  Name: Patrick Montoya MRN: [id number] Date of Birth: April 06, 1941  Today's Date: 02/21/2022 Time: SLP Start Time (ACUTE ONLY): 1225 SLP Stop Time (ACUTE ONLY): 1315 SLP Time Calculation (min) (ACUTE ONLY): 50 min  Past Medical History:  Past Medical History:  Diagnosis Date   Basal cell carcinoma 07/25/2015   L zygoma inf to lat canthus   Basal cell carcinoma 06/11/2018   L nasal ala   Basal cell carcinoma 10/04/2019   Left nose. Infiltrative pattern.   BPH (benign prostatic hyperplasia)    COPD (chronic obstructive pulmonary disease) (HCC)    Elevated liver enzymes    Emphysema of lung (HCC)    Gastric ulcer    GERD (gastroesophageal reflux disease)    Hepatitis B    Hypertension    Parkinson's disease    Prostate hypertrophy    Rotator cuff tear right   UTI (urinary tract infection)    Past Surgical History:  Past Surgical History:  Procedure Laterality Date   CHOLECYSTECTOMY     COLONOSCOPY WITH PROPOFOL N/A 10/20/2015   Procedure: COLONOSCOPY WITH PROPOFOL;  Surgeon: Manya Silvas, MD;  Location: [facility];  Service: Endoscopy;  Laterality: N/A;   COLONOSCOPY WITH PROPOFOL N/A 01/24/2021   Procedure: COLONOSCOPY WITH PROPOFOL;  Surgeon: Toledo, Benay Pike, MD;  Location: [facility] ENDOSCOPY;  Service: Gastroenterology;  Laterality: N/A;   ESOPHAGOGASTRODUODENOSCOPY (EGD) WITH PROPOFOL N/A 10/20/2015   Procedure: ESOPHAGOGASTRODUODENOSCOPY (EGD) WITH PROPOFOL;  Surgeon: Manya Silvas, MD;  Location: [facility] ENDOSCOPY;  Service: Endoscopy;  Laterality: N/A;   GREEN LIGHT LASER TURP (TRANSURETHRAL RESECTION OF PROSTATE N/A 11/17/2018   Procedure: GREEN LIGHT LASER TURP (TRANSURETHRAL RESECTION OF PROSTATE;  Surgeon: Royston Cowper, MD;  Location: [facility] ORS;  Service: Urology;  Laterality: N/A;   INGUINAL HERNIA REPAIR Right 02/18/2019   Procedure: HERNIA REPAIR INGUINAL ADULT;  Surgeon: Royston Cowper, MD;  Location: [facility]  ORS;  Service: Urology;  Laterality: Right;   INSERTION OF MESH Right 02/18/2019   Procedure: INSERTION OF MESH;  Surgeon: Royston Cowper, MD;  Location: [facility] ORS;  Service: Urology;  Laterality: Right;   TONSILLECTOMY     TRANSURETHRAL MICROWAVE THERAPY     HPI:  Pt  is a 81 y.o. male with medical history significant for COPD with chronic respiratory failure on 3 L of oxygen, history of Parkinson's disease, small hiatal hernia(2022), BPH, hypertension, GERD who was brought into the ER by EMS for evaluation of the fever.  Patient is on Hospice with a terminal diagnosis of COPD.  Patient states that he has had fever and chills for about 3 days associated with a cough productive of dark-colored phlegm and worsening shortness of breath from his baseline.  He denies having any sick contacts.  He has had some difficulty with swallowing foods d/t missing Most Dentition, mostly with solids, and states that he knows to chew his food completely and to swallow small portions.    CT of CHEST: Moderate centrilobular and paraseptal emphysema. There  are superimposed patchy airspace opacities throughout the right lung  consistent with multilobar pneumonia. There is minimal dependent  opacity in the left lower lobe.  OF NOTE: Pt endorsed multiple episodes of pneumonia in the past w/ multiple antibiotics prior to this admit.  At this admit, pt converted from atrial fibrillation RVR to normal sinus rhythm yesterday afternoon after bolus of amiodarone given -- feels better today with less shortness of breath per MD note.  Pt  stated he has had pneumonia "each time I have Afib".    Assessment / Plan / Recommendation  Clinical Impression   Pt seen for BSE this date. Pt was awake, verbal. He followed all commands. A/O x4. Mostly Edentulous. Pt reported SOB w/ any exertion prior to admit -- noted afib per admit notes. Pt stated he has had pneumonia "each time I have Afib". Pt also has a charted Hiatal Hernia per Imaging  previously and is missing MOST Dentition to which he stated: "some difficulty with swallowing foods d/t missing Most Dentition, mostly with solids, and states that he knows to chew his food completely and to swallow small portions.". Pt denied any swallowing problems w/ liquids to this Clinician.  Pt is on [place] O2 support 4-6L; afebrile.   Pt presents w/ min increased risk for aspiration in setting of in setting of poor/declined Pulmonary status; Esophageal phase dysmotility w/ Hiatal Hernia, per Imaging. Pt is also Missing MOST Dentition for effective mastication of solid foods.  However, pt is fully A/O x4 and able to feed self and exhibits appropriate awareness of self/limitations. He stated he takes REST BREAKS during eating/tasks d/t SOB secondary to exertion.    Pt presents w/ no overt oropharyngeal phase dysphagia; adequate oropharyngeal phase swallow function when moistening foods cut small for ease of oral phase. However, ANY Pulmonary decline can impact Apnea timing and airway closure during the swallow which can impact pharyngeal swallowing, airway protection, and increase risk for aspiration to occur thus further Pulmonary impact/decline. ANY Esophageal phase Dysmotility can impact swallow and increase risk for aspiration of REFLUX material during Retrograde activity. NSG reported no swallowing deficits w/ small boluses.   During this evaluation, trials of ice chips, water then juice Via Cup, purees and mech soft foods were presented w/ REST BREAKS as needed b/t trials. No overt, clinical s/s of aspiration noted w/ trials; no overt coughing or wet vocal quality noted. Respiratory status remained calm and unlabored from the exertion of the tasks w/ the REST BREAKS b/t trials. Swallowing of liquids was timely.  Oral phase appeared grossly [facility] for trial consistencies given; min increased oral phase time w/ softened solids provided. Pt aware of how to moisten the foods well for ease of oral phase  mashing/gumming.  OM exam revealed no unilateral weakness. Pt fed self w/ setup.    Recommend a more Mech Soft diet w/ gravies to moisten foods well for ease of mashing/gumming during oral phase secondary to lacking Dentition; thin liquids via Cup for best control w/ Pulmonary decline. Aspiration precautions. Pills WHOLE in Puree to lessen risk for aspiration. REST BREAKS during oral intake w/ close monitoring of RR and increased mouth breathing. REFLUX precautions. Setup support at meals.  ST services can be available for further education while admitted; further objective assessment if indicated by MD. Dietician f/u. Hospice services ongoing at D/C. MD/NSG updated. SLP Visit Diagnosis: Dysphagia, unspecified (R13.10) (Edentulous; COPD; missing most Dentition; deconditioned; parkinson's dis.)    Aspiration Risk  Mild aspiration risk;Risk for inadequate nutrition/hydration (reduced following general aspiration precautions)    Diet Recommendation   a more Mech Soft diet w/ gravies to moisten foods well for ease of mashing/gumming during oral phase secondary to lacking Dentition; thin liquids via Cup for best control w/ Pulmonary decline. Aspiration precautions - reduce distractions/talking w/ po's. REST BREAKS during oral intake w/ close monitoring of RR and increased mouth breathing. REFLUX precautions. Setup support at meals.  Medication Administration: Whole meds with puree (  already doing at home for ease of swallowing)    Other  Recommendations Recommended Consults:  (Dietician f/u; on Hospice services) Oral Care Recommendations: Oral care BID;Oral care before and after PO;Patient independent with oral care (setup) Other Recommendations:  (n/a)    Recommendations for follow up therapy are one component of a multi-disciplinary discharge planning process, led by the attending physician.  Recommendations may be updated based on patient status, additional functional criteria and insurance  authorization.  Follow up Recommendations No SLP follow up      Assistance Recommended at Discharge PRN  Functional Status Assessment Patient has had a recent decline in their functional status and demonstrates the ability to make significant improvements in function in a reasonable and predictable amount of time.  Frequency and Duration  (n/a)   (n/a)       Prognosis Prognosis for Safe Diet Advancement: Fair Barriers to Reach Goals: Time post onset;Severity of deficits (Medical decline) Barriers/Prognosis Comment: Edentulous; COPD; missing most Dentition; deconditioned; parkinson's dis. On Hospice services.      Swallow Study   General Date of Onset: 02/20/22 HPI: Pt  is a 81 y.o. male with medical history significant for COPD with chronic respiratory failure on 3 L of oxygen, history of Parkinson's disease, small hiatal hernia(2022), BPH, hypertension, GERD who was brought into the ER by EMS for evaluation of the fever.  Patient is on Hospice with a terminal diagnosis of COPD.  Patient states that he has had fever and chills for about 3 days associated with a cough productive of dark-colored phlegm and worsening shortness of breath from his baseline.  He denies having any sick contacts.  He has had some difficulty with swallowing foods d/t missing Most Dentition, mostly with solids, and states that he knows to chew his food completely and to swallow small portions.    CT of CHEST: Moderate centrilobular and paraseptal emphysema. There  are superimposed patchy airspace opacities throughout the right lung  consistent with multilobar pneumonia. There is minimal dependent  opacity in the left lower lobe.  OF NOTE: Pt endorsed multiple episodes of pneumonia in the past w/ multiple antibiotics prior to this admit.  At this admit, pt converted from atrial fibrillation RVR to normal sinus rhythm yesterday afternoon after bolus of amiodarone given  -Feels better today with less shortness of breath.  Pt  stated he has had pneumonia "each time I have Afib". Type of Study: Bedside Swallow Evaluation Previous Swallow Assessment: none Diet Prior to this Study: NPO (regular diet at home w/ small bites) Temperature Spikes Noted: No (wbc elevated) Respiratory Status: Nasal cannula (4-6L) History of Recent Intubation: No Behavior/Cognition: Alert;Cooperative;Pleasant mood Oral Cavity Assessment: Within Functional Limits Oral Care Completed by SLP: Yes Oral Cavity - Dentition: Missing dentition (most) Vision: Functional for self-feeding Self-Feeding Abilities: Able to feed self;Needs set up Patient Positioning: Upright in bed (supported positioning) Baseline Vocal Quality: Normal Volitional Cough: Strong;Congested Volitional Swallow: Able to elicit    Oral/Motor/Sensory Function Overall Oral Motor/Sensory Function: Within functional limits   Ice Chips Ice chips: Within functional limits Presentation: Spoon (fed; 3 trials)   Thin Liquid Thin Liquid: Within functional limits Presentation: Cup;Self Fed (~8 ozs total) Other Comments: water, juice    Nectar Thick Nectar Thick Liquid: Not tested   Honey Thick Honey Thick Liquid: Not tested   Puree Puree: Within functional limits Presentation: Self Fed;Spoon (8 trials)   Solid     Solid: Impaired (mostly Edentulous) Presentation: Self Fed;Spoon (10 trials) Oral  Phase Impairments: Impaired mastication (mostly edentulous) Oral Phase Functional Implications: Impaired mastication (mostly ednetulous) Pharyngeal Phase Impairments:  (none) Other Comments: moistened foods well        Orinda Kenner, MS, CCC-SLP Speech Language Pathologist Rehab Services; [place] [phone] (ascom) Aireonna Bauer 02/21/2022,4:03 PM

## 2022-02-21 NOTE — Care Management Important Message (Signed)
Important Message  Patient Details  Name: Patrick Montoya MRN: [id number] Date of Birth: [birth year]   Medicare Important Message Given:  N/A - LOS <3 / Initial given by admissions     Dannette Barbara 02/21/2022, 2:26 PM

## 2022-02-21 NOTE — ED Notes (Signed)
ECHO at bedside.

## 2022-02-21 NOTE — Progress Notes (Signed)
[facility] ED [facility]) hospitalized hospice patient visit  Patrick Montoya is a current [facility] hospice patient with a terminal diagnosis of COPD. He was admitted to the hospital on 10.11.23 for Pneumonia and Sepsis after several days of worsening shortness of breath. [facility] was notified of the transfer prior to him going to ER. Per Dr. Karie Georges with [facility] this is a related hospital admission.   Patient remains in the ED. Upon arrival to room he is alert and oriented and reports to feeling better than he did yesterday. He is now on an Amiodarone drip to help control his heart rate and is receiving IVAB. He reports that he feels his breathing is improved and his only real complaint is hunger. He is still NPO pending a swallow eval. Report exchanged with bedside nurse and let her know that he has not had anything to eat or drink besides a little water with pills since Tuesday evening. She reported that she would follow up.  Patient is inpatient appropriate due to need for IV antibiotics, IV Amiodarone for rate control and skilled level monitoring related to severity of his illness.  Vital Signs-  97.9/73/19   129/79    98% on 4L nasal cannula Intake/Output- Not yet recorded Abnormal labs- pO2 61, Bicarb 30.1, Glucose 169, Ca+ 8.4, Magnesium 1.4, Albumin 2.8, AST 112, ALT 46, Lactic 2.2, WBC 19.9, Hgb 11.5, Hct 36.5 Diagnostics-  EXAM: PORTABLE CHEST 1 VIEW  COMPARISON:  Radiograph 08/01/2021  FINDINGS: Unchanged cardiomediastinal silhouette. Mild diffuse interstitial opacities. There is airspace consolidation in the right lower lung. Trace right pleural effusion. No evidence of pneumothorax. No acute osseous abnormality. Thoracic spondylosis.  IMPRESSION: Right lower lung pneumonia.  Diffuse interstitial opacities could reflect a degree of interstitial edema.     IV/PRN Meds- Amiodarone '30mg'$ /H IV continuous, Cefepime 2g IV once, LR 125cc/H continuously, Mag Sulfate 2g IV once, Zosyn 3.375g  IV q8 hours, Vancomycin 1 gram IV once  Problem List-  Sepsis ([place]) As evidenced by tachycardia, tachypnea, lactic acidosis, CT scan findings suggestive of multifocal pneumonia and acute on chronic respiratory failure as patient currently requires 6 L of oxygen above his baseline of 3 L and is tachypneic with increased work of breathing. Aggressive IV fluid resuscitation Place patient on IV Zosyn as prior sputum culture yielded ESBL E. coli Strict aspiration precautions Speech therapy consult for swallow function evaluation Follow-up results of blood cultures   Multifocal pneumonia Suspect aspiration pneumonia in a patient with a history of Parkinson's disease and possible dysphagia. Continue antibiotic therapy with IV Zosyn Speech therapy for swallow function evaluation   Acute on chronic respiratory failure with hypoxia (HCC) Secondary to multifocal pneumonia At baseline patient wears 3 L of oxygen but is currently on 6 L with tachypnea and increased work of breathing due to his acute infection. We will attempt to wean patient off oxygen once his acute illness improves or resolves.   Discharge Planning- Ongoing, will likely d/c to home once treatment completed for acute phase of Pneumonia/Sepsis Family Contact- No contact today as per patient request IDT- Updated Goals of care - Ongoing  Patrick Garner, RN, BSN, [facility] Liaison [phone]

## 2022-02-22 ENCOUNTER — Inpatient Hospital Stay

## 2022-02-22 DIAGNOSIS — J9621 Acute and chronic respiratory failure with hypoxia: Secondary | ICD-10-CM | POA: Diagnosis not present

## 2022-02-22 DIAGNOSIS — I4891 Unspecified atrial fibrillation: Secondary | ICD-10-CM | POA: Diagnosis not present

## 2022-02-22 DIAGNOSIS — A419 Sepsis, unspecified organism: Secondary | ICD-10-CM | POA: Diagnosis not present

## 2022-02-22 DIAGNOSIS — J439 Emphysema, unspecified: Secondary | ICD-10-CM | POA: Diagnosis not present

## 2022-02-22 LAB — PROTIME-INR
INR: 2.8 — ABNORMAL HIGH (ref 0.8–1.2)
Prothrombin Time: 29 seconds — ABNORMAL HIGH (ref 11.4–15.2)

## 2022-02-22 LAB — URINE CULTURE: Culture: 30000 — AB

## 2022-02-22 LAB — CBC
HCT: 33.9 % — ABNORMAL LOW (ref 39.0–52.0)
Hemoglobin: 10.6 g/dL — ABNORMAL LOW (ref 13.0–17.0)
MCH: 32.7 pg (ref 26.0–34.0)
MCHC: 31.3 g/dL (ref 30.0–36.0)
MCV: 104.6 fL — ABNORMAL HIGH (ref 80.0–100.0)
Platelets: 106 10*3/uL — ABNORMAL LOW (ref 150–400)
RBC: 3.24 MIL/uL — ABNORMAL LOW (ref 4.22–5.81)
RDW: 16.1 % — ABNORMAL HIGH (ref 11.5–15.5)
WBC: 16.3 10*3/uL — ABNORMAL HIGH (ref 4.0–10.5)
nRBC: 0 % (ref 0.0–0.2)

## 2022-02-22 LAB — MAGNESIUM: Magnesium: 2.3 mg/dL (ref 1.7–2.4)

## 2022-02-22 LAB — POTASSIUM: Potassium: 4.9 mmol/L (ref 3.5–5.1)

## 2022-02-22 MED ORDER — AMIODARONE HCL 200 MG PO TABS
200.0000 mg | ORAL_TABLET | Freq: Two times a day (BID) | ORAL | Status: DC
  Administered 2022-02-22 – 2022-02-25 (×7): 200 mg via ORAL
  Filled 2022-02-22 (×7): qty 1

## 2022-02-22 MED ORDER — AMIODARONE HCL 200 MG PO TABS: 200.0000 mg | ORAL_TABLET | Freq: Every day | ORAL | Status: DC

## 2022-02-22 MED ORDER — WARFARIN SODIUM 1 MG PO TABS
1.0000 mg | ORAL_TABLET | Freq: Once | ORAL | Status: AC
  Administered 2022-02-22: 1 mg via ORAL
  Filled 2022-02-22: qty 1

## 2022-02-22 MED ORDER — METOPROLOL TARTRATE 5 MG/5ML IV SOLN
2.5000 mg | Freq: Once | INTRAVENOUS | Status: AC
  Administered 2022-02-22: 2.5 mg via INTRAVENOUS
  Filled 2022-02-22: qty 5

## 2022-02-22 MED ORDER — METOPROLOL TARTRATE 25 MG PO TABS
12.5000 mg | ORAL_TABLET | Freq: Three times a day (TID) | ORAL | Status: DC
  Administered 2022-02-22 – 2022-02-26 (×12): 12.5 mg via ORAL
  Filled 2022-02-22 (×13): qty 1

## 2022-02-22 NOTE — Progress Notes (Addendum)
Speech Language Pathology Treatment: Dysphagia  Patient Details Name: Patrick Montoya MRN: [id number] DOB: 07-28-1940 Today's Date: 02/22/2022 Time: 2706-2376 SLP Time Calculation (min) (ACUTE ONLY): 40 min  Assessment / Plan / Recommendation Clinical Impression  Pt seen for f/u w/ toleration of diet and education w/ general aspiration precautions this date. Pt was awake, verbal and engaged easily w/ SLP and NSG in room for meds and breathing tx. He followed all commands. A/O x4. Mostly Edentulous. Pt reported SOB w/ any exertion prior to admit -- noted afib per notes. Pt stated he has had pneumonia "each time I have Afib". Pt also has a charted Hiatal Hernia per Imaging previously and is missing MOST Dentition to which he stated: "some difficulty with swallowing foods d/t missing Most Dentition, mostly with solids, and states that he knows to chew his food completely and to swallow small portions.". Pt denied any swallowing problems w/ liquids to this Clinician and to [place]; NSG reported NO overt swallowing problems at meals today nor w/ oral meds.   Pt is on [place] O2 support 4L; afebrile; WBC declining. Pt exhibited a congested cough expectorating blood tinged phlegm -- NSG present and aware.    Pt presents w/ min increased risk for aspiration in setting of in setting of poor/declined Pulmonary status Baseline - on Hospice services for such; Esophageal phase dysmotility w/ Hiatal Hernia, per Imaging. Pt is also Missing MOST Dentition for effective mastication of solid foods. ANY Pulmonary decline can impact Apnea timing and airway closure during the swallow which can impact pharyngeal swallowing, airway protection, and increase risk for aspiration to occur thus further Pulmonary impact/decline. ANY Esophageal phase Dysmotility can impact swallow and increase risk for aspiration of REFLUX material during Retrograde activity. NSG reported no swallowing deficits w/ small boluses. However, pt is fully A/O x4  and able to feed self and exhibits appropriate awareness of self/limitations. He stated he takes REST BREAKS during eating/tasks d/t SOB secondary to exertion. He Denies any swallowing difficulty except that he has to chew his foods well eating w/out his dentures(normal for him).   Pt presented w/ no overt pharyngeal phase deficits w/ trials of thin liquids. He consumed trials of thin liquids Via Cup/straw(x2) w/ No overt, clinical s/s of aspiration noted w/ trials; no overt coughing or wet vocal quality noted. Respiratory status remained calm and unlabored from the exertion of the tasks -- REST BREAKS given b/t trials to maintain unlabored breathing. Swallowing of liquids was timely.   Oral phase appeared grossly [facility] for bolus management and timely A-P transfer and oral clearing. Recommend Cup drinking for most control of small sips, slowly.  Pt aware of how to moisten the foods well for ease of oral phase mashing/gumming.   Recommend a more Mech Soft diet w/ gravies to moisten foods well for ease of mashing/gumming during oral phase secondary to lacking Dentition; thin liquids via Cup for best control w/ Pulmonary decline. Aspiration precautions. Pills WHOLE in Puree to lessen risk for aspiration. REST BREAKS during oral intake w/ close monitoring of RR and increased mouth breathing. REFLUX precautions. Setup support at meals.  ST services can be available for further education while admitted; further objective assessment if indicated by MD. Dietician f/u.  Hospice services ongoing at D/C. MD/NSG updated; NSG agreed.       HPI HPI: Pt  is a 81 y.o. 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  Patient is on Hospice with a terminal diagnosis of COPD.  Patient states that he has had fever and chills for  about 3 days associated with a cough productive of dark-colored phlegm and worsening shortness of breath from his baseline.  He denies having any sick contacts.  He has had some difficulty with swallowing foods d/t missing Most Dentition, mostly with solids, and states that he knows to chew his food completely and to swallow small portions.    CT of CHEST: Moderate centrilobular and paraseptal emphysema. There  are superimposed patchy airspace opacities throughout the right lung  consistent with multilobar pneumonia. There is minimal dependent  opacity in the left lower lobe.  OF NOTE: Pt endorsed multiple episodes of pneumonia in the past w/ multiple antibiotics prior to this admit.  At this admit, pt converted from atrial fibrillation RVR to normal sinus rhythm yesterday afternoon after bolus of amiodarone given  -Feels better today with less shortness of breath.  Pt stated he has had pneumonia "each time I have Afib".      SLP Plan  All goals met      Recommendations for follow up therapy are one component of a multi-disciplinary discharge planning process, led by the attending physician.  Recommendations may be updated based on patient status, additional functional criteria and insurance authorization.    Recommendations  Diet recommendations: Regular;Dysphagia 3 (mechanical soft);Thin liquid Liquids provided via: Cup;No straw Medication Administration: Whole meds with puree (already doing at home for ease of swallowing) Supervision: Patient able to self feed (setup) Compensations: Minimize environmental distractions;Slow rate;Small sips/bites;Lingual sweep for clearance of pocketing;Follow solids with liquid Postural Changes and/or Swallow Maneuvers: Out of bed for meals;Seated upright 90 degrees;Upright 30-60 min after meal (Reflux precautions)                General recommendations:  (Dietician f/u; Hospice following for Chronic issues) Oral Care Recommendations: Oral care BID;Oral  care before and after PO;Patient independent with oral care (setup) Follow Up Recommendations: No SLP follow up Assistance recommended at discharge: PRN SLP Visit Diagnosis: Dysphagia, unspecified (R13.10) (Edentulous; COPD; missing most Dentition; deconditioned; parkinson's dis.) Plan: All goals met             Orinda Kenner, MS, CCC-SLP Speech Language Pathologist Rehab Services; [place] [phone] (ascom) Beatriz Settles  02/22/2022, 4:17 PM

## 2022-02-22 NOTE — TOC Initial Note (Signed)
Transition of Care [facility]) - Initial/Assessment Note    Patient Details  Name: Patrick Montoya MRN: [id number] Date of Birth: [birth year]  Transition of [facility]) CM/SW Contact:    Alberteen Sam, LCSW Phone Number: 02/22/2022, 9:13 AM  Clinical Narrative:                  Per [facility], patient is Active Hospice patient with them.   TOC will follow for needs.   Expected Discharge Plan: Home w Hospice Care Barriers to Discharge: Continued Medical Work up   Patient Goals and CMS Choice Patient states their goals for this hospitalization and ongoing recovery are:: to go home CMS Medicare.gov Compare Post Acute Care list provided to:: Patient Choice offered to / list presented to : Patient  Expected Discharge Plan and Services Expected Discharge Plan: [place] arrangements for the past 2 months: Single Family Home                                      Prior Living Arrangements/Services Living arrangements for the past 2 months: Single Family Home   Patient language and need for interpreter reviewed:: Yes Do you feel safe going back to the place where you live?: Yes               Activities of Daily Living Home Assistive Devices/Equipment: None ADL Screening (condition at time of admission) Patient's cognitive ability adequate to safely complete daily activities?: Yes Is the patient deaf or have difficulty hearing?: No Does the patient have difficulty seeing, even when wearing glasses/contacts?: No Does the patient have difficulty concentrating, remembering, or making decisions?: No Patient able to express need for assistance with ADLs?: No Does the patient have difficulty dressing or bathing?: No Independently performs ADLs?: No Does the patient have difficulty walking or climbing stairs?: No Weakness of Legs: Both Weakness of Arms/Hands: None  Permission Sought/Granted                  Emotional Assessment Appearance::  Appears stated age            Admission diagnosis:  Atrial fibrillation ([place]) [I48.91] COPD exacerbation ([place]) [J44.1] Atrial fibrillation with rapid ventricular response ([place]) [I48.91] Sepsis ([place]) [A41.9] Acute on chronic respiratory failure with hypoxia ([place]) [J96.21] Pneumonia of right lower lobe due to infectious organism [J18.9] Sepsis with acute hypoxic respiratory failure without septic shock, due to unspecified organism ([place]) [A41.9, R65.20, J96.01] Patient Active Problem List   Diagnosis Date Noted   Atrial fibrillation ([place]) 02/21/2022   Hypokalemia 02/20/2022   Multifocal pneumonia 08/01/2021   HTN (hypertension) 08/01/2021   Sepsis ([place]) 08/01/2021   BPH (benign prostatic hyperplasia) 08/01/2021   COPD exacerbation ([place]) 07/22/2021   Acute on chronic respiratory failure with hypoxia ([place]) 07/22/2021   Paroxysmal atrial fibrillation ([place]) 07/22/2021   Essential hypertension 07/22/2021   History of hepatitis D 07/22/2021   Chronic respiratory failure with hypoxia ([place]) 06/29/2021   History of gastric ulcer 06/29/2021   Paroxysmal atrial fibrillation with rapid ventricular response ([place]) 06/29/2021   Rapid atrial fibrillation ([place]) 06/29/2021   Long term (current) use of anticoagulants 06/20/2021   Chest pain    Acute respiratory failure with hypoxia ([place]) 02/26/2021   Sepsis due to undetermined organism ([place]) 02/26/2021   Bacterial pneumonia 02/26/2021   Parkinson's disease 03/26/2019   Sternal fracture  with retrosternal contusion, closed, initial encounter 10/31/2017   Chronic bronchitis ([place]) 05/28/2015   Gastro-esophageal reflux disease without esophagitis 05/26/2015   Insomnia, 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   COPD with acute exacerbation ([place]) 04/11/2014   Benign prostatic hypertrophy without urinary obstruction 01/21/2014   Acute bronchitis with chronic obstructive pulmonary disease (COPD) ([place]) 01/21/2014   Accelerated hypertension 01/21/2014   SINUSITIS-  ACUTE-NOS 07/03/2007   RENAL CALCULUS 06/05/2007   HYPERTENSION 02/27/2007   COPD (chronic obstructive pulmonary disease) ([place]) 02/27/2007   GERD 02/27/2007   PEPTIC ULCER DISEASE 02/27/2007   BENIGN PROSTATIC HYPERTROPHY 02/27/2007   BURSITIS, RIGHT SHOULDER 02/27/2007   COLONIC POLYPS, HX OF 02/27/2007   FROZEN RIGHT SHOULDER 12/05/2006   PCP:  Tracie Harrier, MD Pharmacy:   [facility] [address] [place] - [place] [address] [place] [address] Phone: [phone] Fax: [place] [address]), [place]) [place] [address] Phone: [phone] Fax: [phone]  [facility] [address] [place] [address] Phone: [phone] Fax: [phone]     Social Determinants of Health (SDOH) Interventions    Readmission Risk Interventions    07/25/2021    9:58 AM  Readmission Risk Prevention Plan  Transportation Screening Complete  PCP or Specialist Appt within 3-5 Days Complete  HRI or Home Care Consult Complete  Palliative Care Screening Not Applicable  Medication Review (RN Care Manager) Complete

## 2022-02-22 NOTE — Progress Notes (Addendum)
Patient coughing up moderate amounts of bloody sputum through out the day.  MD made aware  Warfarin was given for tonight.  Will continue to monitor in case we need to hold further doses.    Gave patient cup to obtain sample for MD to see

## 2022-02-22 NOTE — Progress Notes (Addendum)
Patrick Montoya NOTE       Patient ID: Patrick Montoya MRN: [id number] DOB/AGE: 82/07/1940 82 y.o.  Admit date: 02/20/2022 Referring Physician Dr. Francine Graven Primary Physician Dr. Ginette Pitman Primary Cardiologist Dr. Nehemiah Massed (prev Dr. 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  He was found to be in atrial fibrillation with RVR on admission with heart rates peaking in the 170s, for which cardiology is consulted for further assistance.  Interval History:  -Notified by nursing this morning that the patient's oxygen was disconnected from the wall for about 20-30 minutes this morning, was hypoxic to the 70s, recovered with 5 L by nasal cannula and also he complained of chest pressure at that time.  Telemetry shows he converted back to A-fib with RVR with rates in the 130s to 150s. -Saw the patient with his hospice nurse at bedside, he reports his chest pressure is still present but improving, does not feel as good today compared to yesterday.  He feels like he is in A-fib again.   Review of systems complete and found to be negative unless listed above     Past Medical History:  Diagnosis Date   Basal cell carcinoma 07/25/2015   L zygoma inf to lat canthus   Basal cell carcinoma 06/11/2018   L nasal ala   Basal cell carcinoma 10/04/2019   Left nose. Infiltrative pattern.   BPH (benign prostatic hyperplasia)    COPD (chronic obstructive pulmonary disease) (HCC)    Elevated liver enzymes    Emphysema of lung (HCC)    Gastric ulcer    GERD (gastroesophageal reflux disease)    Hepatitis B    Hypertension    Parkinson's disease    Prostate hypertrophy    Rotator  cuff tear right   UTI (urinary tract infection)     Past Surgical History:  Procedure Laterality Date   CHOLECYSTECTOMY     COLONOSCOPY WITH PROPOFOL N/A 10/20/2015   Procedure: COLONOSCOPY WITH PROPOFOL;  Surgeon: Manya Silvas, MD;  Location: [facility];  Service: Endoscopy;  Laterality: N/A;   COLONOSCOPY WITH PROPOFOL N/A 01/24/2021   Procedure: COLONOSCOPY WITH PROPOFOL;  Surgeon: Toledo, Benay Pike, MD;  Location: [facility] ENDOSCOPY;  Service: Gastroenterology;  Laterality: N/A;   ESOPHAGOGASTRODUODENOSCOPY (EGD) WITH PROPOFOL N/A 10/20/2015   Procedure: ESOPHAGOGASTRODUODENOSCOPY (EGD) WITH PROPOFOL;  Surgeon: Manya Silvas, MD;  Location: [facility] ENDOSCOPY;  Service: Endoscopy;  Laterality: N/A;   GREEN LIGHT LASER TURP (TRANSURETHRAL RESECTION OF PROSTATE N/A 11/17/2018   Procedure: GREEN LIGHT LASER TURP (TRANSURETHRAL RESECTION OF PROSTATE;  Surgeon: Royston Cowper, MD;  Location: [facility] ORS;  Service: Urology;  Laterality: N/A;   INGUINAL HERNIA REPAIR Right 02/18/2019   Procedure: HERNIA REPAIR INGUINAL ADULT;  Surgeon: Royston Cowper, MD;  Location: [facility] ORS;  Service: Urology;  Laterality: Right;   INSERTION OF MESH Right 02/18/2019   Procedure: INSERTION OF MESH;  Surgeon: Royston Cowper, MD;  Location: [facility] ORS;  Service: Urology;  Laterality: Right;   TONSILLECTOMY     TRANSURETHRAL MICROWAVE THERAPY      Medications Prior to Admission  Medication Sig Dispense Refill Last Dose   ALPRAZolam (XANAX) 0.5 MG tablet Take  0.5 mg by mouth at bedtime as needed for anxiety or sleep.   02/19/2022   carbidopa-levodopa (SINEMET IR) 25-100 MG tablet Take 2 tablets by mouth 5 (five) times daily.   02/19/2022   Cyanocobalamin ([employer] VITAMIN B-12) 2000 MCG TBCR Take 2,000 mcg by mouth daily.   02/19/2022   doxazosin (CARDURA) 4 MG tablet Take 4 mg by mouth daily.    02/19/2022   erythromycin ophthalmic ointment APPLY A SMALL AMOUNT INTO LEFT EYE AT BEDTIME   02/19/2022   ferrous gluconate  (FERGON) 324 MG tablet Take 324 mg by mouth daily with breakfast.   02/19/2022   metoprolol succinate (TOPROL-XL) 25 MG 24 hr tablet Take 25 mg by mouth 2 (two) times daily.   02/19/2022   montelukast (SINGULAIR) 10 MG tablet Take 10 mg by mouth at bedtime.   02/19/2022   omeprazole (PRILOSEC) 40 MG capsule Take 40 mg by mouth daily.    02/19/2022   QUEtiapine (SEROQUEL) 50 MG tablet Take 50 mg by mouth at bedtime.   02/19/2022   SPIRIVA RESPIMAT 2.5 MCG/ACT AERS Inhale 2 puffs into the lungs daily.   02/19/2022   warfarin (COUMADIN) 2.5 MG tablet Take 2.5 mg by mouth daily at 4 PM.   02/19/2022 at 1600   albuterol (VENTOLIN HFA) 108 (90 Base) MCG/ACT inhaler Inhale 2 puffs into the lungs every 6 (six) hours as needed for wheezing or shortness of breath. 18 g 11 prn at prn   budesonide-formoterol (SYMBICORT) 160-4.5 MCG/ACT inhaler Inhale 2 puffs into the lungs 2 (two) times daily. 10.2 g 3 prn at prn   cefUROXime (CEFTIN) 250 MG tablet Take 250 mg by mouth 2 (two) times daily. (Patient not taking: Reported on 02/20/2022)   Not Taking   ipratropium-albuterol (DUONEB) 0.5-2.5 (3) MG/3ML SOLN SMARTSIG:3 Milliliter(s) Via Nebulizer Every 6 Hours PRN   prn at prn   metoprolol succinate (TOPROL-XL) 50 MG 24 hr tablet Take 1 tablet (50 mg total) by mouth at bedtime. Take with or immediately following a meal. (Patient not taking: Reported on 02/20/2022) 90 tablet 3 Not Taking   rizatriptan (MAXALT) 10 MG tablet Take 10 mg by mouth as needed for migraine. May repeat in 2 hours if needed   prn at prn   SUMAtriptan (IMITREX) 100 MG tablet Take by mouth.   prn at prn   tiotropium (SPIRIVA) 18 MCG inhalation capsule Place 1 capsule (18 mcg total) into inhaler and inhale daily. (Patient not taking: Reported on 02/20/2022) 30 capsule 12 Not Taking    Social History   Socioeconomic History   Marital status: Married    Spouse name: Not on file   Number of children: Not on file   Years of education: Not on  file   Highest education level: Not on file  Occupational History   Not on file  Tobacco Use   Smoking status: Former    Packs/day: 1.00    Years: 62.00    Total pack years: 62.00    Types: Cigarettes   Smokeless tobacco: Never  Vaping Use   Vaping Use: Never used  Substance and Sexual Activity   Alcohol use: Yes    Comment: occasional   Drug use: No   Sexual activity: Not on file  Other Topics Concern   Not on file  Social History Narrative   Not on file   Social Determinants of Health   Financial Resource Strain: Not on file  Food Insecurity: No Food Insecurity (02/21/2022)  Hunger Vital Sign    Worried About Running Out of Food in the Last Year: Never true    Ran Out of Food in the Last Year: Never true  Transportation Needs: No Transportation Needs (02/21/2022)   PRAPARE - Hydrologist (Medical): No    Lack of Transportation (Non-Medical): No  Physical Activity: Not on file  Stress: Not on file  Social Connections: Not on file  Intimate Partner Violence: Not At Risk (02/21/2022)   Humiliation, Afraid, Rape, and Kick questionnaire    Fear of Current or Ex-Partner: No    Emotionally Abused: No    Physically Abused: No    Sexually Abused: No    Family History  Problem Relation Age of Onset   Colon cancer Mother    Hepatitis Son    Prostate cancer Neg Hx    Kidney cancer Neg Hx      Vitals:   02/22/22 0700 02/22/22 0906 02/22/22 0926 02/22/22 1019  BP:  (!) 165/102  (!) 159/100  Pulse:  (!) 131  (!) 152  Resp:  (!) 22  (!) 21  Temp:  98.2 F (36.8 C)  98.1 F (36.7 C)  TempSrc:      SpO2:  92% 91% 91%  Weight: 76.7 kg       PHYSICAL EXAM General: Elderly, acute on chronically ill appearing Caucasian male, in no acute distress.  Sitting upright in PCU bed.   HEENT:  Normocephalic and atraumatic. Neck:  No JVD.  Lungs: Normal respiratory effort on 4 L by nasal cannula.  Decreased breath sounds throughout without  appreciable crackles or wheezes Heart: Tachycardic irregularly irregular rate and rhythm normal S1 and S2 without gallops or murmurs.  Abdomen: Non-distended appearing.  Msk: Normal strength and tone for age. Extremities: Warm and well perfused. No clubbing, cyanosis.  No peripheral edema.  Neuro: Alert and oriented X 3. Psych:  Answers questions appropriately.   Labs: Basic Metabolic Panel: Recent Labs    02/20/22 1457 02/21/22 0444 02/21/22 1629  NA  --  143 144  K  --  4.7 4.8  CL  --  110 109  CO2  --  28 27  GLUCOSE  --  169* 147*  BUN  --  19 21  CREATININE  --  0.77 0.73  CALCIUM  --  8.4* 8.5*  MG 1.4* 2.2  --     Liver Function Tests: Recent Labs    02/20/22 1030  AST 112*  ALT 46*  ALKPHOS 60  BILITOT 1.2  PROT 5.7*  ALBUMIN 2.8*    No results for input(s): "LIPASE", "AMYLASE" in the last 72 hours. CBC: Recent Labs    02/20/22 1030 02/21/22 0444 02/22/22 0455  WBC 6.9 19.9* 16.3*  NEUTROABS 5.9  --   --   HGB 12.5* 11.5* 10.6*  HCT 39.8 36.5* 33.9*  MCV 101.8* 103.1* 104.6*  PLT 107* 97* 106*    Cardiac Enzymes: Recent Labs    02/20/22 1030 02/20/22 1457  TROPONINIHS 66* 37*    BNP: No results for input(s): "BNP" in the last 72 hours. D-Dimer: No results for input(s): "DDIMER" in the last 72 hours. Hemoglobin A1C: No results for input(s): "HGBA1C" in the last 72 hours. Fasting Lipid Panel: No results for input(s): "CHOL", "HDL", "LDLCALC", "TRIG", "CHOLHDL", "LDLDIRECT" in the last 72 hours. Thyroid Function Tests: No results for input(s): "TSH", "T4TOTAL", "T3FREE", "THYROIDAB" in the last 72 hours.  Invalid input(s): "FREET3" Anemia Panel: No  results for input(s): "VITAMINB12", "FOLATE", "FERRITIN", "TIBC", "IRON", "RETICCTPCT" in the last 72 hours.   Radiology: ECHOCARDIOGRAM COMPLETE  Result Date: 02/21/2022    ECHOCARDIOGRAM REPORT   Patient Name:   CORRAN LALONE Date of Exam: 02/21/2022 Medical Rec #:  [id number]       Height:       69.0 in Accession #:    [id number]     Weight:       175.0 lb Date of Birth:  1941-04-12     BSA:          1.952 m Patient Age:    67 years       BP:           121/72 mmHg Patient Gender: M              HR:           69 bpm. Exam Location:  [facility] Procedure: 2D Echo, Color Doppler and Cardiac Doppler Indications:     Atrial Fibrillation I48.91  History:         Patient has prior history of Echocardiogram examinations, most                  recent 03/10/2021. COPD; Risk Factors:Hypertension.  Sonographer:     Sherrie Sport Referring Phys:  [id number] [place] Diagnosing Phys: Yolonda Kida MD  Sonographer Comments: No parasternal window. IMPRESSIONS  1. Left ventricular ejection fraction, by estimation, is 50 to 55%. The left ventricle has low normal function. The left ventricle has no regional wall motion abnormalities. There is mild concentric left ventricular hypertrophy. Left ventricular diastolic parameters are consistent with Grade I diastolic dysfunction (impaired relaxation).  2. Right ventricular systolic function is normal. The right ventricular size is normal.  3. The mitral valve is normal in structure. Trivial mitral valve regurgitation.  4. The aortic valve is normal in structure. Aortic valve regurgitation is not visualized. FINDINGS  Left Ventricle: Left ventricular ejection fraction, by estimation, is 50 to 55%. The left ventricle has low normal function. The left ventricle has no regional wall motion abnormalities. The left ventricular internal cavity size was normal in size. There is mild concentric left ventricular hypertrophy. Left ventricular diastolic parameters are consistent with Grade I diastolic dysfunction (impaired relaxation). Right Ventricle: The right ventricular size is normal. No increase in right ventricular wall thickness. Right ventricular systolic function is normal. Left Atrium: Left atrial size was normal in size. Right Atrium: Right atrial size was normal  in size. Pericardium: There is no evidence of pericardial effusion. Mitral Valve: The mitral valve is normal in structure. Trivial mitral valve regurgitation. Tricuspid Valve: The tricuspid valve is normal in structure. Tricuspid valve regurgitation is mild. Aortic Valve: The aortic valve is normal in structure. Aortic valve regurgitation is not visualized. Aortic valve mean gradient measures 8.0 mmHg. Aortic valve peak gradient measures 14.7 mmHg. Aortic valve area, by VTI measures 2.15 cm. Pulmonic Valve: The pulmonic valve was normal in structure. Pulmonic valve regurgitation is not visualized. Aorta: The ascending aorta was not well visualized. IAS/Shunts: No atrial level shunt detected by color flow Doppler.  LEFT VENTRICLE PLAX 2D LVIDd:         3.70 cm     Diastology LVIDs:         2.90 cm     LV e' medial:    6.64 cm/s LV PW:         1.40 cm     LV  E/e' medial:  13.6 LV IVS:        1.30 cm     LV e' lateral:   9.57 cm/s LVOT diam:     2.10 cm     LV E/e' lateral: 9.4 LV SV:         76 LV SV Index:   39 LVOT Area:     3.46 cm  LV Volumes (MOD) LV vol d, MOD A4C: 83.2 ml LV vol s, MOD A4C: 40.9 ml LV SV MOD A4C:     83.2 ml RIGHT VENTRICLE RV Basal diam:  4.90 cm RV Mid diam:    4.00 cm RV S prime:     14.50 cm/s TAPSE (M-mode): 2.3 cm LEFT ATRIUM             Index        RIGHT ATRIUM           Index LA diam:        3.20 cm 1.64 cm/m   RA Area:     25.70 cm LA Vol (A2C):   66.3 ml 33.96 ml/m  RA Volume:   93.90 ml  48.10 ml/m LA Vol (A4C):   51.5 ml 26.38 ml/m LA Biplane Vol: 65.0 ml 33.30 ml/m  AORTIC VALVE AV Area (Vmax):    1.91 cm AV Area (Vmean):   1.84 cm AV Area (VTI):     2.15 cm AV Vmax:           192.00 cm/s AV Vmean:          132.000 cm/s AV VTI:            0.353 m AV Peak Grad:      14.7 mmHg AV Mean Grad:      8.0 mmHg LVOT Vmax:         106.00 cm/s LVOT Vmean:        70.300 cm/s LVOT VTI:          0.219 m LVOT/AV VTI ratio: 0.62  AORTA Ao Root diam: 3.00 cm MITRAL VALVE                 TRICUSPID VALVE MV Area (PHT): 3.54 cm     TR Peak grad:   20.8 mmHg MV Decel Time: 214 msec     TR Vmax:        228.00 cm/s MV E velocity: 90.10 cm/s MV A velocity: 113.00 cm/s  SHUNTS MV E/A ratio:  0.80         Systemic VTI:  0.22 m                             Systemic Diam: 2.10 cm Yolonda Kida MD Electronically signed by Yolonda Kida MD Signature Date/Time: 02/21/2022/1:11:59 PM    Final    CT Angio Chest PE W and/or Wo Contrast  Result Date: 02/20/2022 CLINICAL DATA:  Shortness of breath since last night. Pulmonary embolism suspected, high probability. History of chronic obstructive pulmonary disease on home oxygen. EXAM: CT ANGIOGRAPHY CHEST WITH CONTRAST TECHNIQUE: Multidetector CT imaging of the chest was performed using the standard protocol during bolus administration of intravenous contrast. Multiplanar CT image reconstructions and MIPs were obtained to evaluate the vascular anatomy. RADIATION DOSE REDUCTION: This exam was performed according to the departmental dose-optimization program which includes automated exposure control, adjustment of the mA and/or kV according to patient size and/or use of iterative reconstruction technique.  CONTRAST:  56m OMNIPAQUE IOHEXOL 350 MG/ML SOLN COMPARISON:  Chest CTA 08/01/2021.  Chest radiographs 02/20/2022. FINDINGS: Cardiovascular: The pulmonary arteries are well opacified with contrast to the level of the subsegmental branches. There is no evidence of acute pulmonary embolism. Relatively mild atherosclerosis of the aorta, great vessels and coronary arteries. There is limited systemic arterial opacification without evidence of acute vascular abnormality. Possible aortic valvular calcifications. The heart size is normal. A small amount of pericardial fluid appears unchanged. Mediastinum/Nodes: There are no enlarged mediastinal, hilar or axillary lymph nodes.Small mediastinal lymph nodes are unchanged, likely reactive. The thyroid gland, trachea and  esophagus demonstrate no significant findings. Lungs/Pleura: Moderate centrilobular and paraseptal emphysema. There are superimposed patchy airspace opacities throughout the right lung consistent with multilobar pneumonia. There is minimal dependent opacity in the left lower lobe. Unchanged small perifissural nodule along the minor fissure on image 89/5. No suspicious pulmonary nodules. Upper abdomen: The visualized upper abdomen appears stable status post cholecystectomy. No acute findings. Musculoskeletal/Chest wall: There is no chest wall mass or suspicious osseous finding. Review of the MIP images confirms the above findings. IMPRESSION: 1. No evidence of acute pulmonary embolism or other acute vascular findings in the chest. 2. Patchy airspace opacities throughout the right lung consistent with multilobar pneumonia. The airspace opacities are predominantly dependent in location and could be secondary to aspiration. Radiographic follow-up recommended to ensure resolution. 3. Aortic Atherosclerosis (ICD10-I70.0) and Emphysema (ICD10-J43.9). Electronically Signed   By: WRichardean [name].D.   On: 02/20/2022 12:20   DG Chest Port 1 View  Result Date: 02/20/2022 CLINICAL DATA:  Questionable sepsis - evaluate for abnormality EXAM: PORTABLE CHEST 1 VIEW COMPARISON:  Radiograph 08/01/2021 FINDINGS: Unchanged cardiomediastinal silhouette. Mild diffuse interstitial opacities. There is airspace consolidation in the right lower lung. Trace right pleural effusion. No evidence of pneumothorax. No acute osseous abnormality. Thoracic spondylosis. IMPRESSION: Right lower lung pneumonia. Diffuse interstitial opacities could reflect a degree of interstitial edema. Electronically Signed   By: JMaurine SimmeringM.D.   On: 02/20/2022 10:18    Lexiscan myoview 06/27/2021 Impression  1. No significant ischemia noted on current study.  2. There is a small size, moderate in intensity defect of the basal septal  segment which is  predominantly fixed.  3. Normal LV systolic function, but with TID ratio of 1.23.  Narrative  Table formatting from the original result was not included.  [facility]  [address]   [id number] [phone]  Procedure: Pharmacologic Myocardial Perfusion Imaging    ONE day procedure   Indication: Paroxysmal A-fib (CMS-HCC)  Plan: NM myocardial perfusion SPECT multiple (stress        and rest), ECG stress test only   Shortness of breath  Plan: NM myocardial perfusion SPECT multiple (stress        and rest), ECG stress test only   Ordering Physician:   Dr. RDonnelly Angelica   Clinical History:  81y.o. year old male  Vitals: Height: 69 in Weight: 170 lb  Cardiac risk factors include:     AFIB and HTN    Procedure:   Pharmacologic stress testing was performed with Regadenoson using a single  use 0.'4mg'$ /524m(0.08 mg/ml) prefilled syringe intravenously infused as a  bolus dose over 10-15 seconds. The stress test was stopped due to Infusion  completion.  Blood pressure response was normal. The patient did not  develop any symptoms other than fatigue during  the procedure.   Rest HR: 71bpm  Rest BP: 150/39mHg  Max HR: 88bpm  Min BP: 132/774mg   Stress Test Administered by: DIOswald HillockCMA   ECG Interpretation:  Rest ECG:  normal sinus rhythm, none  Stress ECG:  normal sinus rhythm, none  Recovery ECG:  normal sinus rhythm  ECG Interpretation:  negative.    Administrations This Visit     regadenoson (LEXISCAN) 0.4 mg/5 mL inj syringe 0.4 mg     Admin Date  06/28/2021 Action  Given Dose  0.4 mg Route  Intravenous Administered By  GoHerbert SetaCNMT     technetium [id number] (CARDIOLITE) injection 10.70.17llicurie     Admin Date  06/28/2021 Action  Given Dose  10.79.39llicurie Route  Intravenous Administered By  GoaHerbert SetaNMT     technetium Tc99m31mtamibi (CARDIOLITE)  injection 31.003.00licurie     Admin Date  06/28/2021 Action  Given Dose  31.092.33licurie Route  Intravenous Administered By  MaytCherlynn PerchesMT   Gated post-stress perfusion imaging was performed 30 minutes after stress.  Rest images were performed 30 minutes after injection.   Gated LV Analysis:   Summary of LV Perfusion: Normal,   Summary of LV Function: Normal    TID Ratio: 1.24   LVEF= 58%   FINDINGS:  Regional wall motion:  reveals normal myocardial thickening and wall  motion.  The overall quality of the study is fair.    Artifacts noted: yes  Left ventricular cavity: normal.   ECHO 03/10/2021 IMPRESSIONS     1. Left ventricular ejection fraction, by estimation, is 55 to 60%. The  left ventricle has normal function. The left ventricle has no regional  wall motion abnormalities. Left ventricular diastolic parameters were  normal.   2. Right ventricular systolic function is normal. The right ventricular  size is normal.   3. The mitral valve is normal in structure. Mild mitral valve  regurgitation.   4. The aortic valve is normal in structure. Aortic valve regurgitation is  trivial.   FINDINGS   Left Ventricle: Left ventricular ejection fraction, by estimation, is 55  to 60%. The left ventricle has normal function. The left ventricle has no  regional wall motion abnormalities. The left ventricular internal cavity  size was normal in size. There is   no left ventricular hypertrophy. Left ventricular diastolic parameters  were normal.   Right Ventricle: The right ventricular size is normal. No increase in  right ventricular wall thickness. Right ventricular systolic function is  normal.   Left Atrium: Left atrial size was normal in size.   Right Atrium: Right atrial size was normal in size.   Pericardium: There is no evidence of pericardial effusion.   Mitral Valve: The mitral valve is normal in structure. Mild mitral valve  regurgitation.    Tricuspid Valve: The tricuspid valve is normal in structure. Tricuspid  valve regurgitation is trivial.   Aortic Valve: The aortic valve is normal in structure. Aortic valve  regurgitation is trivial. Aortic valve peak gradient measures 14.1 mmHg.   Pulmonic Valve: The pulmonic valve was normal in structure. Pulmonic valve  regurgitation is not visualized.   Aorta: The aortic root and ascending aorta are structurally normal, with  no evidence of dilitation.   IAS/Shunts: No atrial level shunt detected by color flow Doppler.      LEFT VENTRICLE  PLAX 2D  LVIDd:         4.10 cm  Diastology  LVIDs:         3.00 cm   LV e' medial:    5.11 cm/s  LV PW:         0.90 cm   LV E/e' medial:  16.3  LV IVS:        1.20 cm   LV e' lateral:   7.51 cm/s  LVOT diam:     1.70 cm   LV E/e' lateral: 11.1  LVOT Area:     2.27 cm      RIGHT VENTRICLE  RV Basal diam:  3.70 cm  RV Mid diam:    3.10 cm  RV S prime:     10.90 cm/s  TAPSE (M-mode): 1.9 cm   LEFT ATRIUM             Index        RIGHT ATRIUM           Index  LA diam:        3.90 cm 2.04 cm/m   RA Area:     16.70 cm  LA Vol (A2C):   50.6 ml 26.53 ml/m  RA Volume:   47.60 ml  24.96 ml/m  LA Vol (A4C):   65.7 ml 34.45 ml/m  LA Biplane Vol: 59.4 ml 31.14 ml/m   AORTIC VALVE                 PULMONIC VALVE  AV Area (Vmax): 1.39 cm     PV Vmax:        1.14 m/s  AV Vmax:        188.00 cm/s  PV Peak grad:   5.2 mmHg  AV Peak Grad:   14.1 mmHg    RVOT Peak grad: 2 mmHg  LVOT Vmax:      115.00 cm/s     AORTA  Ao Root diam: 2.80 cm   MITRAL VALVE                TRICUSPID VALVE  MV Area (PHT): 3.06 cm     TR Peak grad:   28.9 mmHg  MV Decel Time: 248 msec     TR Vmax:        269.00 cm/s  MV E velocity: 83.40 cm/s  MV A velocity: 102.00 cm/s  SHUNTS  MV E/A ratio:  0.82         Systemic Diam: 1.70 cm  MV A Prime:    10.9 cm/s   Serafina Royals MD  Electronically signed by Serafina Royals MD  Signature Date/Time:  03/12/2021/8:49:16 AM   TELEMETRY reviewed by me (LT) 02/22/2022 : The morning of 10/13, atrial fibrillation with RVR rates from 120 up to 160s, converted to sinus rhythm rates in the 80s shortly after IV metoprolol x1  EKG reviewed by me: Atrial fibrillation with RVR rate 167  Data reviewed by me (LT) 02/22/2022: CBC, BMP, EKGs, telemetry, vitals, discussed with nursing and hospitalist  Principal Problem:   Sepsis ([place]) Active Problems:   COPD (chronic obstructive pulmonary disease) ([place])   Parkinson's disease   Chronic respiratory failure with hypoxia ([place])   Long term (current) use of anticoagulants   Rapid atrial fibrillation (HCC)   Acute on chronic respiratory failure with hypoxia (HCC)   Multifocal pneumonia   HTN (hypertension)   Hypokalemia   Atrial fibrillation ([place])    ASSESSMENT AND PLAN:  Patrick Montoya is an 72yoM with a past medical history of paroxysmal atrial fibrillation on Coumadin,  essential hypertension, 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  He was found to be in atrial fibrillation with RVR on admission with heart rates peaking in the 170s, for which cardiology is consulted for further assistance.  #Sepsis secondary to multifocal pneumonia #Chronic respiratory failure/COPD on baseline 3 L -Agree with current therapy per primary team, has been given 2 L of LR, started on broad-spectrum antibiotics -Cleared by speech therapy for diet.  #Pseudomonas cystitis Trace leuks on UA, Confirmed on urine culture, on IV Zosyn.  #Paroxysmal atrial fibrillation with RVR Has had several episodes of this over the past year, usually always in the setting of fever or pneumonia.  Typically responsive to IV calcium channel blockers.  EKG shows atrial fibrillation with rate 168, on telemetry so far he has been consistently 130-140  prior to amiodarone.  He converted to NSR after amiodarone bolus was given and remains in sinus rhythm on 10/12.  Converted back to A-fib with RVR the morning of 10/13 after his oxygen concentrator was disconnected and he was hypoxic. -Continue to treat underlying causes of increased adrenergic tone, including infection, hypoxia, pain -Monitor and replete electrolytes for goal K >4, mag >2 -S/p IV diltiazem 20 mg x 1, started on a diltiazem infusion which resulted in marked hypotension. -S/p amiodarone bolus and continuous infusion x24 hours. -Continue amiodarone p.o. 200 mg twice daily x5 days, then 200 mg daily thereafter. -Ordered IV metoprolol 2.5 mg x 1 with conversion from AF RVR to sinus rhythm. -Ordered metoprolol tartrate 12.5 mg 3 times daily with hold parameters for hypotension.  (On 25 mg metoprolol XL once daily at home) -Continue warfarin per pharmacy consult, appreciate their assistance -CHA2DS2-VASc 3 (age, HTN) -Echocardiogram complete resulted with preserved EF 50-55%, g1 dd, no RWMAS  #Elevated troponin Initially 66 on first check, repeat downtrending at 37.  Troponin elevation most consistent with demand/supply mismatch in the setting of sepsis and tachycardia and not ACS   This patient's plan of care was discussed and created with Dr. Clayborn Bigness and he is in agreement.  Signed: Tristan Schroeder , PA-C 02/22/2022, 10:42 AM [facility]

## 2022-02-22 NOTE — Progress Notes (Signed)
Patient started having chest pressure (10 out of 10) with SOB.   Vital signs and EKG completed.   Patient back in A.fib RVR in 130s.  Noted O2 was 69-70%.  Check oxygen on wall- [place] cord had become disconnected from wall.  Reconnect and bump O2 level up to 5L until oxygen saturation improved then turned down to 4L.    Made MD aware.   Will continue to monitor for now.  Possible due to hypoxia.     Chest pain improved to 4 out of 10 prior to leaving room.

## 2022-02-22 NOTE — Progress Notes (Signed)
[facility] Montoya  PROGRESS NOTE  TIP ATIENZA EXN:[id number] DOB: 08-26-1940 DOA: 02/20/2022 PCP: Tracie Harrier, MD   Brief HPI:   81 year old male with past medical history of paroxysmal atrial fibrillation on anticoagulant Coumadin, essential hypertension, COPD on 3 days of oxygen under hospice care, Parkinson disease, BPH came to ED on 02/20/2022 with worsening shortness of breath, fever of 103 F productive cough.  He was found to be in atrial fibrillation with RVR.  Patient states that he had fever and chills for about 3 days, was associated with cough productive of dark-colored phlegm and worsening shortness of breath from baseline.  Chest x-ray showed right lower lobe pneumonia CT angiogram of the chest showed multifocal pneumonia Patient started on IV Zosyn due to prior history of ESBL E. coli in sputum culture    Subjective   Patient seen and examined, developed chest pain this morning after he was found to be hypoxemic with O2 sats in 70s.  It was found that patient's oxygen was discontinued from the wall and once oxygen was recollected his hypoxemia improved and chest pain also improved.  Patient was found to be in A-fib with RVR.  Improved after patient was given IV metoprolol by cardiology. At this time still has mild chest pressure but improved since morning. Had 1 episode of mild hemoptysis this morning   Assessment/Plan:     Sepsis -Presented with tachycardia, tachypnea, lactic acidosis -CT scan findings suggestive of multifocal pneumonia acute on chronic respiratory failure -Patient started on IV Zosyn; due to prior history of sputum culture yielding ESBL E. Coli -WBC is down to 16,000  Multifocal pneumonia -Likely aspiration pneumonia in the setting of Parkinson disease with possible dysphagia -Started on IV antibiotics as above -Speech therapy consulted for swallow evaluation -Patient had 1 episode of hemoptysis this morning, will repeat chest  x-ray -Continue IV Zosyn  Chest pain -Likely from hypoxemia;  -Patient was found to be hypoxemic after oxygen was found to be unhooked from the wall -Hypoxemia improved after hooking back oxygen to the wall source -Chest pain has improved -Management per cardiology  Pseudomonas UTI -Urine culture growing Pseudomonas aeruginosa -Sensitive to Zosyn -Continue IV Zosyn as above  Acute on chronic respiratory failure with hypoxemia -Secondary to multifocal pneumonia -Patient wears oxygen at baseline 3 L/min -Currently requiring 4 L/min of oxygen, nearing his baseline  Atrial fibrillation with RVR -Patient was hypotensive so did not receive metoprolol or Cardizem -Cardiology was consulted -Started on amiodarone -Continue anticoagulation with Coumadin  Parkinson disease -Continue Sinemet  Hypokalemia -Replete  Hypertension -Started back on metoprolol   COPD -Continue bronchodilator therapy as well as inhaled steroids   Medications     amiodarone  200 mg Oral BID   Followed by   Derrill Memo ON 02/27/2022] amiodarone  200 mg Oral Daily   carbidopa-levodopa  2 tablet Oral 5 X Daily   cyanocobalamin  2,000 mcg Oral Daily   ferrous gluconate  324 mg Oral Q breakfast   metoprolol tartrate  12.5 mg Oral TID   mometasone-formoterol  2 puff Inhalation BID   montelukast  10 mg Oral QHS   pantoprazole  40 mg Oral Daily   QUEtiapine  50 mg Oral QHS   warfarin  1 mg Oral ONCE-1600   Warfarin - Pharmacist Dosing Inpatient   Does not apply q1600     Data Reviewed:   CBG:  No results for input(s): "GLUCAP" in the last 168 hours.  SpO2: (!) 88 % O2 Flow Rate (  L/min): 4 L/min    Vitals:   02/22/22 1019 02/22/22 1137 02/22/22 1236 02/22/22 1341  BP: (!) 159/100 131/83 121/70 115/67  Pulse: (!) 152 80 74 81  Resp: (!) 21 (!) 26 18 (!) 22  Temp: 98.1 F (36.7 C) 98.7 F (37.1 C) 97.9 F (36.6 C) 98.2 F (36.8 C)  TempSrc:    Oral  SpO2: 91% 93% 98% (!) 88%  Weight:           Data Reviewed:  Basic Metabolic Panel: Recent Labs  Lab 02/20/22 1030 02/20/22 1457 02/21/22 0444 02/21/22 1629 02/22/22 0443  NA 142  --  143 144  --   K 3.3*  --  4.7 4.8 4.9  CL 109  --  110 109  --   CO2 28  --  28 27  --   GLUCOSE 130*  --  169* 147*  --   BUN 24*  --  19 21  --   CREATININE 0.91  --  0.77 0.73  --   CALCIUM 8.1*  --  8.4* 8.5*  --   MG  --  1.4* 2.2  --  2.3    CBC: Recent Labs  Lab 02/20/22 1030 02/21/22 0444 02/22/22 0455  WBC 6.9 19.9* 16.3*  NEUTROABS 5.9  --   --   HGB 12.5* 11.5* 10.6*  HCT 39.8 36.5* 33.9*  MCV 101.8* 103.1* 104.6*  PLT 107* 97* 106*    LFT Recent Labs  Lab 02/20/22 1030  AST 112*  ALT 46*  ALKPHOS 60  BILITOT 1.2  PROT 5.7*  ALBUMIN 2.8*     Antibiotics: Anti-infectives (From admission, onward)    Start     Dose/Rate Route Frequency Ordered Stop   02/20/22 1500  piperacillin-tazobactam (ZOSYN) IVPB 3.375 g        3.375 g 12.5 mL/hr over 240 Minutes Intravenous Every 8 hours 02/20/22 1404     02/20/22 0945  vancomycin (VANCOCIN) IVPB 1000 mg/200 mL premix        1,000 mg 200 mL/hr over 60 Minutes Intravenous  Once 02/20/22 0943 02/20/22 1148   02/20/22 0945  ceFEPIme (MAXIPIME) 2 g in sodium chloride 0.9 % 100 mL IVPB        2 g 200 mL/hr over 30 Minutes Intravenous  Once 02/20/22 0943 02/20/22 1044        DVT prophylaxis: Coumadin  Code Status: DNR  Family Communication: No family at bedside   CONSULTS cardiology   Objective    Physical Examination:  General-appears in no acute distress Heart-S1-S2, irregular, no murmur auscultated Lungs-clear to auscultation bilaterally Abdomen-soft, nontender, no organomegaly Extremities-no edema in the lower extremities Neuro-alert, oriented x3, no focal deficit noted  Status is: Inpatient:          Oswald Hillock   [facility] If 7PM-7AM, please contact night-coverage at [email or web address], Office   [phone]   02/22/2022, 3:57 PM  LOS: 2 days

## 2022-02-22 NOTE — Progress Notes (Signed)
[facility]) hospitalized hospice patient visit [name] is a current [facility] hospice patient with a terminal diagnosis of COPD. He was admitted to the hospital on 10.11.23 for Pneumonia and Sepsis after several days of worsening shortness of breath. [facility] was notified of the transfer prior to him going to ER. Per Dr. Karie Georges with [facility] this is a related hospital admission.  Visited at bedside, patient is noticeably in more distress than when seen yesterday. He is anxious, complaining of chest pressure and mildly short of breath. Cardiology NP to bedside and does of IV Metoprolol ordered. Patient was noted to be off oxygen when checked on earlier by nursing as it had been pulled out of the wall.  Patient is inpatient appropriate due to need for IV antibiotics, IV Metoprolol for rate control and skilled level monitoring related to severity of his illness. Vital Signs-  98.1/152/21   159/100   spO2  91% on 4L Intake/Output- 0/2125 Abnormal labs- Ca+ 8.5, RBC 3.24, Hgb 10.6, Hct 33.9, INR 2.8 Diagnostics-  None New IV/PRN Meds- Amiodarone '30mg'$ /H IV continuous (stopped 10.12 at 4pm) , LR 75cc/H continuously, Zosyn 3.375g IV q8 hours Problem List-  Sepsis -Presented with tachycardia, tachypnea, lactic acidosis -CT scan findings suggestive of multifocal pneumonia acute on chronic respiratory failure -Patient started on IV Zosyn; due to prior history of sputum culture yielding ESBL E. Coli   Multifocal pneumonia -Likely aspiration pneumonia in the setting of Parkinson disease with possible dysphagia -Started on IV antibiotics as above -Speech therapy consulted for swallow evaluation   Acute on chronic respiratory failure with hypoxemia -Secondary to multifocal pneumonia -Patient wears oxygen at baseline 3 L/min -Currently requiring 4 L/min of oxygen, nearing his baseline   Atrial fibrillation with RVR -Patient was hypotensive so did not receive metoprolol or  Cardizem -Cardiology was consulted -Started on amiodarone -Continue anticoagulation with Coumadin   Discharge Planning- Ongoing, will likely d/c to home once treatment completed for acute phase of Pneumonia/Sepsis Family Contact- No contact today as per patient who is alert and oriented requests IDT- Updated Goals of care - Ongoing   Jhonnie Garner, Therapist, sports, BSN, [facility] Liaison [phone]

## 2022-02-22 NOTE — Progress Notes (Signed)
   02/22/22 1019  Assess: MEWS Score  Temp 98.1 F (36.7 C)  BP (!) 159/100  MAP (mmHg) 113  Pulse Rate (!) 152  Resp (!) 21  SpO2 91 %  O2 Device Nasal Cannula  O2 Flow Rate (L/min) 4 L/min  Assess: MEWS Score  MEWS Temp 0  MEWS Systolic 0  MEWS Pulse 3  MEWS RR 1  MEWS LOC 0  MEWS Score 4  MEWS Score Color Red  Assess: if the MEWS score is Yellow or Red  Were vital signs taken at a resting state? Yes  Focused Assessment Change from prior assessment (see assessment flowsheet)  Does the patient meet 2 or more of the SIRS criteria? Yes  Does the patient have a confirmed or suspected source of infection? Yes  Provider and Rapid Response Notified? No  MEWS guidelines implemented *See Row Information* Yes  Take Vital Signs  Increase Vital Sign Frequency  Red: Q 1hr X 4 then Q 4hr X 4, if remains red, continue Q 4hrs  Escalate  MEWS: Escalate Red: discuss with charge nurse/RN and provider, consider discussing with RRT  Notify: Charge Nurse/RN  Name of Charge Nurse/RN Notified Janett Billow RN  Date Charge Nurse/RN Notified 02/22/22  Time Charge Nurse/RN Notified 1010  Notify: Provider  Provider Name/Title [place] [facility] [place]  Date Provider Notified 02/22/22  Time Provider Notified [phone]  Method of Notification Page (secure chat)  Notification Reason Change in status  Provider response See new orders  Date of Provider Response 02/22/22  Time of Provider Response 1020  Document  Patient Outcome Stabilized after interventions  Progress note created (see row info) Yes  Assess: SIRS CRITERIA  SIRS Temperature  0  SIRS Pulse 1  SIRS Respirations  1  SIRS WBC 1  SIRS Score Sum  3   Cardiology ordered Metoprolol IV and restart PO TID.  Will continue to monitor blood pressure

## 2022-02-22 NOTE — Consult Note (Signed)
[place] for warfarin Indication: atrial fibrillation  No Known Allergies  Patient Measurements: Weight: 76.7 kg (169 lb 1.5 oz)  Vital Signs: Temp: 97.7 F (36.5 C) (10/13 0532) Temp Source: Oral (10/13 0532) BP: 137/97 (10/13 0532) Pulse Rate: 93 (10/13 0532)  Labs: Recent Labs    02/20/22 1030 02/20/22 1457 02/21/22 0444 02/21/22 1629 02/22/22 0455  HGB 12.5*  --  11.5*  --  10.6*  HCT 39.8  --  36.5*  --  33.9*  PLT 107*  --  97*  --  106*  APTT 55*  --   --   --   --   LABPROT 25.4*  --  29.6*  --  29.0*  INR 2.3*  --  2.8*  --  2.8*  CREATININE 0.91  --  0.77 0.73  --   TROPONINIHS 66* 37*  --   --   --      Estimated Creatinine Clearance: 72.4 mL/min (by C-G formula based on SCr of 0.73 mg/dL).  Medical History: Past Medical History:  Diagnosis Date   Basal cell carcinoma 07/25/2015   L zygoma inf to lat canthus   Basal cell carcinoma 06/11/2018   L nasal ala   Basal cell carcinoma 10/04/2019   Left nose. Infiltrative pattern.   BPH (benign prostatic hyperplasia)    COPD (chronic obstructive pulmonary disease) (HCC)    Elevated liver enzymes    Emphysema of lung (HCC)    Gastric ulcer    GERD (gastroesophageal reflux disease)    Hepatitis B    Hypertension    Parkinson's disease    Prostate hypertrophy    Rotator cuff tear right   UTI (urinary tract infection)     Medications:  Warfarin 2.5 mg daily (last PTA dose 10/10 @ 1600) Total weekly dose: 17.5 mg  Assessment: 81 yo M with PMH terminal COPD (3 L [place] @ home), Afib, HTN, BPH, GERD presenting with worsening SOB. Admitted to hospice 01/19/2022 with dx terminal COPD. Patient will be initiated on Zosyn for aspiration pneumonia. Due to difficulty affording DOACs, pt takes warfarin 2.5 mg daily for atrial fibrillation. Goal INR 2-3. Pt currently in RVR and will be loaded on amiodarone for rate and rhythm control.  DDIs with warfarin: Amiodarone (enhanced AC  effect) Zosyn (enhanced AC effect) Quetiapine (enhanced AC effect)  Date INR Warfarin Dose 10/10  2.5 mg 10/11 2.3 1.5 mg 10/12 2.8 Hold 10/13 2.8 1 mg   Baseline Labs: aPTT - 55; INR - 2.3 Hgb - 12.5; Plts - 107  Goal of Therapy:  INR 2-3 Monitor platelets by anticoagulation protocol: Yes   Plan: Due to starting amiodarone load, empirically dose reduce warfarin by 40% on discharge Will give reduced warfarin dose of 1 mg today Anticipate can initiate 40% reduction of previous regimen to 1.5 mg daily on discharge Patient will need new Rx on discharge as he has 2.5 mg tablets at home  Monitor INR daily Monitor CBC at least every 7 days while on warfarin  Dorothe Pea, PharmD, BCPS Clinical Pharmacist   02/22/2022 8:53 AM

## 2022-02-22 NOTE — Progress Notes (Addendum)
Patient O2 dropping low 80s after weaning to 3L post chest pain.   Increased to 4L to maintain sats above 88-90% per MD.    Even on 4L O2 saturations dropping to 88-87% before quickly recovering to around 89-90%.  Will continue to monitor.  Encouraged deep breathing exercises.  Patient is prone to taking off his O2 to blow nose and O2 sats drop very quickly into the 60s-70s.  Tried to encourage patient to leave [place] by nose to remind him to put it back in.

## 2022-02-23 DIAGNOSIS — J439 Emphysema, unspecified: Secondary | ICD-10-CM | POA: Diagnosis not present

## 2022-02-23 DIAGNOSIS — J9621 Acute and chronic respiratory failure with hypoxia: Secondary | ICD-10-CM | POA: Diagnosis not present

## 2022-02-23 DIAGNOSIS — A419 Sepsis, unspecified organism: Secondary | ICD-10-CM | POA: Diagnosis not present

## 2022-02-23 DIAGNOSIS — I4891 Unspecified atrial fibrillation: Secondary | ICD-10-CM | POA: Diagnosis not present

## 2022-02-23 LAB — PROTIME-INR
INR: 2.6 — ABNORMAL HIGH (ref 0.8–1.2)
Prothrombin Time: 27.3 seconds — ABNORMAL HIGH (ref 11.4–15.2)

## 2022-02-23 MED ORDER — SODIUM CHLORIDE 0.9 % IV SOLN: INTRAVENOUS | Status: DC | PRN

## 2022-02-23 MED ORDER — WARFARIN SODIUM 1 MG PO TABS
1.0000 mg | ORAL_TABLET | Freq: Once | ORAL | Status: DC
  Filled 2022-02-23: qty 1

## 2022-02-23 MED ORDER — METOPROLOL TARTRATE 5 MG/5ML IV SOLN
5.0000 mg | INTRAVENOUS | Status: DC | PRN
  Administered 2022-02-23 (×2): 5 mg via INTRAVENOUS
  Filled 2022-02-23: qty 5

## 2022-02-23 MED ORDER — METOPROLOL TARTRATE 5 MG/5ML IV SOLN
INTRAVENOUS | Status: AC
  Filled 2022-02-23: qty 5

## 2022-02-23 NOTE — Progress Notes (Addendum)
Patient was taking a nap when he suddenly felt SOB.   O2 saturation dropped to 77-78.  Patient symptomatic, wheezing, endorses tightness in chest.  Called RRT to get breathing treatment.   Tried turning O2  up to 5 L, but did not improve O2.  Applied a NRB.  Saturation improved to 93-94.   Breathing treatment given.  Tried to place patient back on 5L, but O2 dropped to 83%.   During episode, patient's HR jumped back up into the 150-160s.     Paged MD and Cardiology.   1710 - Oral dose of Metoprolol given while waiting on response from MD.    1730- IV dose metoprolol given - HR up to 170s; O2 saturation around 86% on 7L.   Changed over to HFNC at 12L.  Sats. Improved to 91%  1807- converted back to NSR, HR at 87, O2 at 94% on 10L HFNC.  Will start to wean down.  MD notified

## 2022-02-23 NOTE — Progress Notes (Signed)
[facility] Card[facility]    SUBJECTIVE: Patient resting comfortably requiring supplemental oxygen up to 5 L because of mild hypoxemia patient feels reasonably well no still has some cough but improving still on antibiotic therapy denies any chest pain or tachycardia   Vitals:   02/22/22 2053 02/23/22 0548 02/23/22 0822 02/23/22 1308  BP: 126/73 131/81 135/81 (!) 153/77  Pulse: 74 64 72 75  Resp:   (!) 28 20  Temp: 98.2 F (36.8 C) 98 F (36.7 C) (!) 97.5 F (36.4 C) 98.2 F (36.8 C)  TempSrc: Oral Oral Oral   SpO2: 93% 91% 92% 93%  Weight:         Intake/Output Summary (Last 24 hours) at 02/23/2022 1448 Last data filed at 02/23/2022 1305 Gross per 24 hour  Intake 240 ml  Output 1075 ml  Net -835 ml      PHYSICAL EXAM  General: Well developed, well nourished, in no acute distress HEENT:  Normocephalic and atramatic Neck:  No JVD.  Lungs: Clear bilaterally to auscultation and percussion. Heart: HRRR . Normal S1 and S2 without gallops or murmurs.  Abdomen: Bowel sounds are positive, abdomen soft and non-tender  Msk:  Back normal, normal gait. Normal strength and tone for age. Extremities: No clubbing, cyanosis or edema.   Neuro: Alert and oriented X 3. Psych:  Good affect, responds appropriately   LABS: Basic Metabolic Panel: Recent Labs    02/21/22 0444 02/21/22 1629 02/22/22 0443  NA 143 144  --   K 4.7 4.8 4.9  CL 110 109  --   CO2 28 27  --   GLUCOSE 169* 147*  --   BUN 19 21  --   CREATININE 0.77 0.73  --   CALCIUM 8.4* 8.5*  --   MG 2.2  --  2.3   Liver Function Tests: No results for input(s): "AST", "ALT", "ALKPHOS", "BILITOT", "PROT", "ALBUMIN" in the last 72 hours. No results for input(s): "LIPASE", "AMYLASE" in the last 72 hours. CBC: Recent Labs    02/21/22 0444 02/22/22 0455  WBC 19.9* 16.3*  HGB 11.5* 10.6*  HCT 36.5* 33.9*  MCV 103.1* 104.6*  PLT 97* 106*   Cardiac Enzymes: No results for input(s): "CKTOTAL", "CKMB", "CKMBINDEX",  "TROPONINI" in the last 72 hours. BNP: Invalid input(s): "POCBNP" D-Dimer: No results for input(s): "DDIMER" in the last 72 hours. Hemoglobin A1C: No results for input(s): "HGBA1C" in the last 72 hours. Fasting Lipid Panel: No results for input(s): "CHOL", "HDL", "LDLCALC", "TRIG", "CHOLHDL", "LDLDIRECT" in the last 72 hours. Thyroid Function Tests: No results for input(s): "TSH", "T4TOTAL", "T3FREE", "THYROIDAB" in the last 72 hours.  Invalid input(s): "FREET3" Anemia Panel: No results for input(s): "VITAMINB12", "FOLATE", "FERRITIN", "TIBC", "IRON", "RETICCTPCT" in the last 72 hours.  DG Chest Port 1 View  Result Date: 02/22/2022 CLINICAL DATA:  Hemoptysis EXAM: PORTABLE CHEST 1 VIEW COMPARISON:  Previous studies including the examination of 02/20/2022 FINDINGS: Transverse diameter of heart is slightly increased. Central pulmonary vessels are more prominent. There are patchy infiltrates in right parahilar region and both lower lung fields, more so on the right side. Lateral CP angles are clear. There is no pneumothorax. IMPRESSION: Patchy infiltrates are seen in right mid and both lower lung fields, more so on the right side suggesting possible multifocal pneumonia. Possibility of superimposed interstitial pulmonary edema is not excluded. Electronically Signed   By: Elmer Picker M.D.   On: 02/22/2022 16:02     Echo preserved left ventricular function EF around 50  to 55%  TELEMETRY: Normal sinus rhythm rate of 72.  Nonspecific ST-T wave changes:  ASSESSMENT AND PLAN:  Principal Problem:   Sepsis ([place]) Active Problems:   COPD (chronic obstructive pulmonary disease) (HCC)   Parkinson's disease   Chronic respiratory failure with hypoxia (HCC)   Long term (current) use of anticoagulants   Rapid atrial fibrillation (HCC)   Acute on chronic respiratory failure with hypoxia (HCC)   Multifocal pneumonia   HTN (hypertension)   Hypokalemia   Atrial fibrillation (HCC)     Plan Atrial fibrillation paroxysmal continue amiodarone continue metoprolol as well as anticoagulation Agree with inhalers for COPD type symptoms Broad-spectrum antibiotic therapy for bronchitis possible pneumonia Agree with hypertension management and control Continue supplemental oxygen for hypoxemia as well as inhalers Continue treatment for Pseudomonas urinary tract infection History of Parkinson's continue Sinemet therapy follow-up with neurology Continue to correct electrolytes   Yolonda Kida, MD 02/23/2022 2:48 PM

## 2022-02-23 NOTE — Consult Note (Signed)
[place] for warfarin Indication: atrial fibrillation  No Known Allergies  Patient Measurements: Weight: 76.7 kg (169 lb 1.5 oz)  Vital Signs: Temp: 97.5 F (36.4 C) (10/14 0822) Temp Source: Oral (10/14 0822) BP: 135/81 (10/14 0822) Pulse Rate: 72 (10/14 0822)  Labs: Recent Labs    02/20/22 1030 02/20/22 1457 02/21/22 0444 02/21/22 1629 02/22/22 0455 02/23/22 0733  HGB 12.5*  --  11.5*  --  10.6*  --   HCT 39.8  --  36.5*  --  33.9*  --   PLT 107*  --  97*  --  106*  --   APTT 55*  --   --   --   --   --   LABPROT 25.4*  --  29.6*  --  29.0* 27.3*  INR 2.3*  --  2.8*  --  2.8* 2.6*  CREATININE 0.91  --  0.77 0.73  --   --   TROPONINIHS 66* 37*  --   --   --   --      Estimated Creatinine Clearance: 72.4 mL/min (by C-G formula based on SCr of 0.73 mg/dL).  Medical History: Past Medical History:  Diagnosis Date   Basal cell carcinoma 07/25/2015   L zygoma inf to lat canthus   Basal cell carcinoma 06/11/2018   L nasal ala   Basal cell carcinoma 10/04/2019   Left nose. Infiltrative pattern.   BPH (benign prostatic hyperplasia)    COPD (chronic obstructive pulmonary disease) (HCC)    Elevated liver enzymes    Emphysema of lung (HCC)    Gastric ulcer    GERD (gastroesophageal reflux disease)    Hepatitis B    Hypertension    Parkinson's disease    Prostate hypertrophy    Rotator cuff tear right   UTI (urinary tract infection)     Medications:  Warfarin 2.5 mg daily (last PTA dose 10/10 @ 1600) Total weekly dose: 17.5 mg  Assessment: 81 yo M with PMH terminal COPD (3 L [place] @ home), Afib, HTN, BPH, GERD presenting with worsening SOB. Admitted to hospice 01/19/2022 with dx terminal COPD. Patient will be initiated on Zosyn for aspiration pneumonia. Due to difficulty affording DOACs, pt takes warfarin 2.5 mg daily for atrial fibrillation. Goal INR 2-3. Pt currently in RVR and will be loaded on amiodarone for rate and rhythm  control.  DDIs with warfarin: Amiodarone (enhanced AC effect) Zosyn (enhanced AC effect) Quetiapine (enhanced AC effect)  Date INR Warfarin Dose 10/10  2.5 mg 10/11 2.3 1.5 mg 10/12 2.8 Hold 10/13 2.8 1 mg  10/14 2.6  Baseline Labs: aPTT - 55; INR - 2.3 Hgb - 12.5; Plts - 107  Goal of Therapy:  INR 2-3 Monitor platelets by anticoagulation protocol: Yes   Plan: Due to starting amiodarone load, empirically dose reduce warfarin by 40% on discharge Will give reduced warfarin dose of 1 mg again today 10/14 Anticipate can initiate 40% reduction of previous regimen to 1.5 mg daily on discharge Patient will need new Rx on discharge as he has 2.5 mg tablets at home  Monitor INR daily Monitor CBC at least every 7 days while on warfarin  Noralee Space, PharmD Clinical Pharmacist   02/23/2022 10:06 AM

## 2022-02-23 NOTE — Plan of Care (Signed)
Patient with ongoing dyspnea, shortness of breath. Remains PCU level of care appropriate. On HFNC at 8 L/min.   Problem: Fluid Volume: Goal: Hemodynamic stability will improve Outcome: Not Progressing   Problem: Clinical Measurements: Goal: Diagnostic test results will improve Outcome: Not Progressing Goal: Signs and symptoms of infection will decrease Outcome: Not Progressing   Problem: Respiratory: Goal: Ability to maintain adequate ventilation will improve Outcome: Not Progressing   Problem: Education: Goal: Knowledge of General Education information will improve Description: Including pain rating scale, medication(s)/side effects and non-pharmacologic comfort measures Outcome: Not Progressing   Problem: Health Behavior/Discharge Planning: Goal: Ability to manage health-related needs will improve Outcome: Not Progressing   Problem: Clinical Measurements: Goal: Ability to maintain clinical measurements within normal limits will improve Outcome: Not Progressing Goal: Will remain free from infection Outcome: Not Progressing Goal: Diagnostic test results will improve Outcome: Not Progressing Goal: Respiratory complications will improve Outcome: Not Progressing Goal: Cardiovascular complication will be avoided Outcome: Not Progressing   Problem: Activity: Goal: Risk for activity intolerance will decrease Outcome: Not Progressing   Problem: Nutrition: Goal: Adequate nutrition will be maintained Outcome: Not Progressing   Problem: Coping: Goal: Level of anxiety will decrease Outcome: Not Progressing   Problem: Elimination: Goal: Will not experience complications related to bowel motility Outcome: Not Progressing Goal: Will not experience complications related to urinary retention Outcome: Not Progressing   Problem: Pain Managment: Goal: General experience of comfort will improve Outcome: Not Progressing   Problem: Safety: Goal: Ability to remain free from  injury will improve Outcome: Not Progressing   Problem: Skin Integrity: Goal: Risk for impaired skin integrity will decrease Outcome: Not Progressing

## 2022-02-23 NOTE — Progress Notes (Signed)
[facility]  PROGRESS NOTE  DESEAN HEEMSTRA ALP:[id number] DOB: 1940/07/29 DOA: 02/20/2022 PCP: Tracie Harrier, MD   Brief HPI:   81 year old male with past medical history of paroxysmal atrial fibrillation on anticoagulant Coumadin, essential hypertension, COPD on 3 days of oxygen under hospice care, Parkinson disease, BPH came to ED on 02/20/2022 with worsening shortness of breath, fever of 103 F productive cough.  He was found to be in atrial fibrillation with RVR.  Patient states that he had fever and chills for about 3 days, was associated with cough productive of dark-colored phlegm and worsening shortness of breath from baseline.  Chest x-ray showed right lower lobe pneumonia CT angiogram of the chest showed multifocal pneumonia Patient started on IV Zosyn due to prior history of ESBL E. coli in sputum culture    Subjective   Patient seen and examined, developed hemoptysis yesterday.  Has been coughing up brown-colored phlegm.  Chest x-ray obtained yesterday showed multifocal pneumonia.   Assessment/Plan:     Sepsis -Presented with tachycardia, tachypnea, lactic acidosis -CT scan findings suggestive of multifocal pneumonia acute on chronic respiratory failure -Patient started on IV Zosyn; due to prior history of sputum culture yielding ESBL E. Coli -WBC is down to 16,000  Multifocal pneumonia -Likely aspiration pneumonia in the setting of Parkinson disease with possible dysphagia -Started on IV antibiotics as above -Speech therapy consulted for swallow evaluation -Patient continues to have hemoptysis, will hold Coumadin at this time. -Continue IV Zosyn  Chest pain -Likely from hypoxemia;  -Patient was found to be hypoxemic after oxygen was found to be unhooked from the wall -Hypoxemia improved after hooking back oxygen to the wall source -Chest pain has improved -Management per cardiology  Pseudomonas UTI -Urine culture growing Pseudomonas  aeruginosa -Sensitive to Zosyn -Continue IV Zosyn as above  Acute on chronic respiratory failure with hypoxemia -Secondary to multifocal pneumonia -Patient wears oxygen at baseline 3 L/min -Currently requiring 4 L/min of oxygen, nearing his baseline  Atrial fibrillation with RVR -Patient was hypotensive so did not receive metoprolol or Cardizem -Cardiology was consulted -Started on amiodarone -Patient developed hemoptysis as above, will hold anticoagulation with Coumadin at this time.    Parkinson disease -Continue Sinemet  Hypokalemia -Replete  Hypertension -Started back on metoprolol   COPD -Continue bronchodilator therapy as well as inhaled steroids   Medications     amiodarone  200 mg Oral BID   Followed by   Derrill Memo ON 02/27/2022] amiodarone  200 mg Oral Daily   carbidopa-levodopa  2 tablet Oral 5 X Daily   cyanocobalamin  2,000 mcg Oral Daily   ferrous gluconate  324 mg Oral Q breakfast   metoprolol tartrate       metoprolol tartrate  12.5 mg Oral TID   mometasone-formoterol  2 puff Inhalation BID   montelukast  10 mg Oral QHS   pantoprazole  40 mg Oral Daily   QUEtiapine  50 mg Oral QHS     Data Reviewed:   CBG:  No results for input(s): "GLUCAP" in the last 168 hours.  SpO2: 91 % O2 Flow Rate (L/min): 10 L/min    Vitals:   02/23/22 1725 02/23/22 1734 02/23/22 1735 02/23/22 1750  BP:      Pulse:      Resp:      Temp:      TempSrc:      SpO2: (!) 86% 92% 93% 91%  Weight:          Data Reviewed:  Basic Metabolic Panel: Recent Labs  Lab 02/20/22 1030 02/20/22 1457 02/21/22 0444 02/21/22 1629 02/22/22 0443  NA 142  --  143 144  --   K 3.3*  --  4.7 4.8 4.9  CL 109  --  110 109  --   CO2 28  --  28 27  --   GLUCOSE 130*  --  169* 147*  --   BUN 24*  --  19 21  --   CREATININE 0.91  --  0.77 0.73  --   CALCIUM 8.1*  --  8.4* 8.5*  --   MG  --  1.4* 2.2  --  2.3    CBC: Recent Labs  Lab 02/20/22 1030 02/21/22 0444  02/22/22 0455  WBC 6.9 19.9* 16.3*  NEUTROABS 5.9  --   --   HGB 12.5* 11.5* 10.6*  HCT 39.8 36.5* 33.9*  MCV 101.8* 103.1* 104.6*  PLT 107* 97* 106*    LFT Recent Labs  Lab 02/20/22 1030  AST 112*  ALT 46*  ALKPHOS 60  BILITOT 1.2  PROT 5.7*  ALBUMIN 2.8*     Antibiotics: Anti-infectives (From admission, onward)    Start     Dose/Rate Route Frequency Ordered Stop   02/20/22 1500  piperacillin-tazobactam (ZOSYN) IVPB 3.375 g        3.375 g 12.5 mL/hr over 240 Minutes Intravenous Every 8 hours 02/20/22 1404     02/20/22 0945  vancomycin (VANCOCIN) IVPB 1000 mg/200 mL premix        1,000 mg 200 mL/hr over 60 Minutes Intravenous  Once 02/20/22 0943 02/20/22 1148   02/20/22 0945  ceFEPIme (MAXIPIME) 2 g in sodium chloride 0.9 % 100 mL IVPB        2 g 200 mL/hr over 30 Minutes Intravenous  Once 02/20/22 0943 02/20/22 1044        DVT prophylaxis: Coumadin  Code Status: DNR  Family Communication: No family at bedside   CONSULTS cardiology   Objective    Physical Examination:  General-appears in no acute distress Heart-S1-S2, irregular, no murmur auscultated Lungs-clear to auscultation bilaterally, no wheezing or crackles auscultated Abdomen-soft, nontender, no organomegaly Extremities-no edema in the lower extremities Neuro-alert, oriented x3, no focal deficit noted  Status is: Inpatient:          Oswald Hillock   [facility] If 7PM-7AM, please contact night-coverage at [email or web address], Office  [phone]   02/23/2022, 6:37 PM  LOS: 3 days

## 2022-02-23 NOTE — Progress Notes (Signed)
   02/23/22 2140  Assess: MEWS Score  BP (!) 142/122  MAP (mmHg) 119  ECG Heart Rate (!) 141  Resp (!) 24  Assess: MEWS Score  MEWS Temp 0  MEWS Systolic 0  MEWS Pulse 3  MEWS RR 1  MEWS LOC 0  MEWS Score 4  MEWS Score Color Red  Assess: if the MEWS score is Yellow or Red  Were vital signs taken at a resting state? Yes  Focused Assessment Change from prior assessment (see assessment flowsheet)  Does the patient meet 2 or more of the SIRS criteria? No  Does the patient have a confirmed or suspected source of infection? Yes  Provider and Rapid Response Notified? Yes  MEWS guidelines implemented *See Row Information* No, vital signs rechecked  Treat  MEWS Interventions Escalated (See documentation below)  Pain Scale 0-10  Pain Score 0  Take Vital Signs  Increase Vital Sign Frequency  Red: Q 1hr X 4 then Q 4hr X 4, if remains red, continue Q 4hrs  Escalate  MEWS: Escalate Red: discuss with charge nurse/RN and provider, consider discussing with RRT  Notify: Charge Nurse/RN  Name of Charge Nurse/RN Notified Hinton Lovely., RN  Date Charge Nurse/RN Notified 02/23/22  Time Charge Nurse/RN Notified 2140  Document  Patient Outcome Stabilized after interventions  Progress note created (see row info) Yes  Assess: SIRS CRITERIA  SIRS Temperature  0  SIRS Pulse 1  SIRS Respirations  1  SIRS WBC 1  SIRS Score Sum  3     02/23/22 2150  Assess: MEWS Score  BP 132/89  MAP (mmHg) 99  Pulse Rate (!) 128  ECG Heart Rate (!) 128  Resp (!) 33  SpO2 93 %  O2 Device HFNC  O2 Flow Rate (L/min) 8 L/min  Assess: MEWS Score  MEWS Temp 0  MEWS Systolic 0  MEWS Pulse 2  MEWS RR 2  MEWS LOC 0  MEWS Score 4  MEWS Score Color Red  Notify: Provider  Provider Name/Title Sharion Settler, NP  Date Provider Notified 02/23/22  Time Provider Notified 2157  Method of Notification Page  Notification Reason Other (Comment) (VS)  Provider response No new orders  Date of Provider Response 02/23/22   Time of Provider Response 2212  Assess: SIRS CRITERIA  SIRS Temperature  0  SIRS Pulse 1  SIRS Respirations  1  SIRS WBC 1  SIRS Score Sum  3

## 2022-02-23 NOTE — Progress Notes (Signed)
[facility]) hospitalized hospice patient visit  Patrick Montoya is a current [facility] hospice patient with a terminal diagnosis of COPD. He was admitted to the hospital on 10.11.23 for Pneumonia and Sepsis after several days of worsening shortness of breath. [facility] was notified of the transfer prior to him going to ER. Per Dr. Karie Georges with [facility] this is a related hospital admission.   Mr. Bracamonte continues to report feelings off and on of heart racing and shortness of breath. He is currently on 5L of oxygen and they have started him on Amiodarone orally for rate control. He continues to receive IVAB. He was again coughing up blood tinged sputum this morning.  Patient is inpatient appropriate due to need for IV antibiotics,  skilled level monitoring related to severity of his illness and medication changes  Vital Signs-  98.2/89/22   153/77    93% on 5L Intake/Output- 240/1075 Abnormal labs- PT 27.3, INR 2.6 Diagnostics-  None New IV/PRN Meds- Zosyn 3.375g IV q8 hours  Problem List-  Sepsis -Presented with tachycardia, tachypnea, lactic acidosis -CT scan findings suggestive of multifocal pneumonia acute on chronic respiratory failure -Patient started on IV Zosyn; due to prior history of sputum culture yielding ESBL E. Coli -WBC is down to 16,000  Multifocal pneumonia -Likely aspiration pneumonia in the setting of Parkinson disease with possible dysphagia -Started on IV antibiotics as above -Speech therapy consulted for swallow evaluation -Patient had 1 episode of hemoptysis this morning, will repeat chest x-ray -Continue IV Zosyn  Chest pain -Likely from hypoxemia;  -Patient was found to be hypoxemic after oxygen was found to be unhooked from the wall -Hypoxemia improved after hooking back oxygen to the wall source -Chest pain has improved -Management per cardiology  Pseudomonas UTI -Urine culture growing Pseudomonas aeruginosa -Sensitive to Zosyn -Continue IV Zosyn as  above  Acute on chronic respiratory failure with hypoxemia -Secondary to multifocal pneumonia -Patient wears oxygen at baseline 3 L/min -Currently requiring 4 L/min of oxygen, nearing his baseline  Atrial fibrillation with RVR -Patient was hypotensive so did not receive metoprolol or Cardizem -Cardiology was consulted -Started on amiodarone -Continue anticoagulation with Coumadin  Discharge Planning- Ongoing, will likely d/c to home once treatment completed for acute phase of Pneumonia/Sepsis Family Contact- No contact today as per patient who is alert and oriented requests IDT- Updated Goals of care - Ongoing  Jhonnie Garner, Therapist, sports, BSN, [facility] Liaison [phone]

## 2022-02-24 DIAGNOSIS — J9611 Chronic respiratory failure with hypoxia: Secondary | ICD-10-CM

## 2022-02-24 DIAGNOSIS — A419 Sepsis, unspecified organism: Secondary | ICD-10-CM | POA: Diagnosis not present

## 2022-02-24 DIAGNOSIS — I10 Essential (primary) hypertension: Secondary | ICD-10-CM

## 2022-02-24 DIAGNOSIS — J9600 Acute respiratory failure, unspecified whether with hypoxia or hypercapnia: Secondary | ICD-10-CM

## 2022-02-24 DIAGNOSIS — J9621 Acute and chronic respiratory failure with hypoxia: Secondary | ICD-10-CM | POA: Diagnosis not present

## 2022-02-24 DIAGNOSIS — G20A1 Parkinson's disease without dyskinesia, without mention of fluctuations: Secondary | ICD-10-CM

## 2022-02-24 DIAGNOSIS — I4811 Longstanding persistent atrial fibrillation: Secondary | ICD-10-CM | POA: Diagnosis not present

## 2022-02-24 DIAGNOSIS — J189 Pneumonia, unspecified organism: Secondary | ICD-10-CM

## 2022-02-24 DIAGNOSIS — R6520 Severe sepsis without septic shock: Secondary | ICD-10-CM

## 2022-02-24 DIAGNOSIS — E876 Hypokalemia: Secondary | ICD-10-CM

## 2022-02-24 DIAGNOSIS — J439 Emphysema, unspecified: Secondary | ICD-10-CM

## 2022-02-24 DIAGNOSIS — Z7901 Long term (current) use of anticoagulants: Secondary | ICD-10-CM

## 2022-02-24 DIAGNOSIS — I4891 Unspecified atrial fibrillation: Secondary | ICD-10-CM

## 2022-02-24 LAB — CBC
HCT: 33.6 % — ABNORMAL LOW (ref 39.0–52.0)
Hemoglobin: 10.8 g/dL — ABNORMAL LOW (ref 13.0–17.0)
MCH: 32.8 pg (ref 26.0–34.0)
MCHC: 32.1 g/dL (ref 30.0–36.0)
MCV: 102.1 fL — ABNORMAL HIGH (ref 80.0–100.0)
Platelets: 133 10*3/uL — ABNORMAL LOW (ref 150–400)
RBC: 3.29 MIL/uL — ABNORMAL LOW (ref 4.22–5.81)
RDW: 15.5 % (ref 11.5–15.5)
WBC: 7.6 10*3/uL (ref 4.0–10.5)
nRBC: 0 % (ref 0.0–0.2)

## 2022-02-24 LAB — BASIC METABOLIC PANEL
Anion gap: 6 (ref 5–15)
BUN: 18 mg/dL (ref 8–23)
CO2: 32 mmol/L (ref 22–32)
Calcium: 8.2 mg/dL — ABNORMAL LOW (ref 8.9–10.3)
Chloride: 105 mmol/L (ref 98–111)
Creatinine, Ser: 0.71 mg/dL (ref 0.61–1.24)
GFR, Estimated: >60 mL/min (ref >60)
Glucose, Bld: 92 mg/dL (ref 70–99)
Potassium: 3.7 mmol/L (ref 3.5–5.1)
Sodium: 143 mmol/L (ref 135–145)

## 2022-02-24 LAB — PROTIME-INR
INR: 2.2 — ABNORMAL HIGH (ref 0.8–1.2)
Prothrombin Time: 23.9 seconds — ABNORMAL HIGH (ref 11.4–15.2)

## 2022-02-24 MED ORDER — ORAL CARE MOUTH RINSE
15.0000 mL | OROMUCOSAL | Status: DC
  Administered 2022-02-24 – 2022-02-26 (×6): 15 mL via OROMUCOSAL

## 2022-02-24 MED ORDER — ORAL CARE MOUTH RINSE: 15.0000 mL | OROMUCOSAL | Status: DC | PRN

## 2022-02-24 NOTE — Progress Notes (Signed)
[facility]) hospitalized hospice patient visit  Patrick Montoya is a current [facility] hospice patient with a terminal diagnosis of COPD. He was admitted to the hospital on 10.11.23 for Pneumonia and Sepsis after several days of worsening shortness of breath. [facility] was notified of the transfer prior to him going to ER. Per Dr. Karie Georges with [facility] this is a related hospital admission.   Patient has required increase in oxygen up to 8L due to worsening shortness of breath during the night and continues to have issues with A-fib. He reports that he just doesn't feel well and is hopeful that the medicine will start working.  Patient is inpatient appropriate due to need for IV antibiotics,  skilled level monitoring related to severity of his illness and medication changes  Vital Signs-  98/73/20   134/73   96% 8L HFNC Intake/Output- 140/850 Abnormal labs- Ca+ 8.2, RBC 3.29, Hgb 10.8, Hct 33.6, PT 23.9, INR 2.2 Diagnostics-  None New  IV/PRN Meds- Zosyn 3.375g IV q8 hours, They are loading him on PO Amiodarone  Problem List-  Sepsis -Presented with tachycardia, tachypnea, lactic acidosis -CT scan findings suggestive of multifocal pneumonia acute on chronic respiratory failure -Patient started on IV Zosyn; due to prior history of sputum culture yielding ESBL E. Coli -WBC is down to 16,000  Multifocal pneumonia -Likely aspiration pneumonia in the setting of Parkinson disease with possible dysphagia -Started on IV antibiotics as above -Speech therapy consulted for swallow evaluation -Patient had 1 episode of hemoptysis this morning, will repeat chest x-ray -Continue IV Zosyn  Chest pain -Likely from hypoxemia;  -Patient was found to be hypoxemic after oxygen was found to be unhooked from the wall -Hypoxemia improved after hooking back oxygen to the wall source -Chest pain has improved -Management per cardiology  Pseudomonas UTI -Urine culture growing Pseudomonas  aeruginosa -Sensitive to Zosyn -Continue IV Zosyn as above  Acute on chronic respiratory failure with hypoxemia -Secondary to multifocal pneumonia -Patient wears oxygen at baseline 3 L/min -Currently requiring 4 L/min of oxygen, nearing his baseline  Atrial fibrillation with RVR -Patient was hypotensive so did not receive metoprolol or Cardizem -Cardiology was consulted -Started on amiodarone -Continue anticoagulation with Coumadin  Discharge Planning- Ongoing, will likely d/c to home once treatment completed for acute phase of Pneumonia/Sepsis Family Contact- No contact today as per patient who is alert and oriented requests IDT- Updated Goals of care - Ongoing, patient remains full code and wants treatment for acute illness Jhonnie Garner, Therapist, sports, BSN, [facility] Liaison [phone]

## 2022-02-24 NOTE — Progress Notes (Signed)
[facility]    SUBJECTIVE: Patient states to be doing reasonably well still has some mild shortness of breath but much improved no sputum production minimal cough   Vitals:   02/24/22 0200 02/24/22 0400 02/24/22 0540 02/24/22 0836  BP: 129/69 133/73 131/79 (!) 148/80  Pulse: 67 67 64 65  Resp: (!) 24 19 (!) 24 18  Temp: 98.4 F (36.9 C) 98 F (36.7 C) 98 F (36.7 C) 97.8 F (36.6 C)  TempSrc:  Oral Oral Oral  SpO2: 95% 92% 94% 95%  Weight:      Height:         Intake/Output Summary (Last 24 hours) at 02/24/2022 1023 Last data filed at 02/24/2022 0541 Gross per 24 hour  Intake 395.96 ml  Output 2125 ml  Net -1729.04 ml      PHYSICAL EXAM  General: Well developed, well nourished, in no acute distress HEENT:  Normocephalic and atramatic Neck:  No JVD.  Lungs: Clear bilaterally to auscultation and percussion. Heart: HRRR . Normal S1 and S2 without gallops or murmurs.  Abdomen: Bowel sounds are positive, abdomen soft and non-tender  Msk:  Back normal, normal gait. Normal strength and tone for age. Extremities: No clubbing, cyanosis or edema.   Neuro: Alert and oriented X 3. Psych:  Good affect, responds appropriately   LABS: Basic Metabolic Panel: Recent Labs    02/21/22 1629 02/22/22 0443 02/24/22 0549  NA 144  --  143  K 4.8 4.9 3.7  CL 109  --  105  CO2 27  --  32  GLUCOSE 147*  --  92  BUN 21  --  18  CREATININE 0.73  --  0.71  CALCIUM 8.5*  --  8.2*  MG  --  2.3  --    Liver Function Tests: No results for input(s): "AST", "ALT", "ALKPHOS", "BILITOT", "PROT", "ALBUMIN" in the last 72 hours. No results for input(s): "LIPASE", "AMYLASE" in the last 72 hours. CBC: Recent Labs    02/22/22 0455 02/24/22 0549  WBC 16.3* 7.6  HGB 10.6* 10.8*  HCT 33.9* 33.6*  MCV 104.6* 102.1*  PLT 106* 133*   Cardiac Enzymes: No results for input(s): "CKTOTAL", "CKMB", "CKMBINDEX", "TROPONINI" in the last 72 hours. BNP: Invalid input(s):  "POCBNP" D-Dimer: No results for input(s): "DDIMER" in the last 72 hours. Hemoglobin A1C: No results for input(s): "HGBA1C" in the last 72 hours. Fasting Lipid Panel: No results for input(s): "CHOL", "HDL", "LDLCALC", "TRIG", "CHOLHDL", "LDLDIRECT" in the last 72 hours. Thyroid Function Tests: No results for input(s): "TSH", "T4TOTAL", "T3FREE", "THYROIDAB" in the last 72 hours.  Invalid input(s): "FREET3" Anemia Panel: No results for input(s): "VITAMINB12", "FOLATE", "FERRITIN", "TIBC", "IRON", "RETICCTPCT" in the last 72 hours.  DG Chest Port 1 View  Result Date: 02/22/2022 CLINICAL DATA:  Hemoptysis EXAM: PORTABLE CHEST 1 VIEW COMPARISON:  Previous studies including the examination of 02/20/2022 FINDINGS: Transverse diameter of heart is slightly increased. Central pulmonary vessels are more prominent. There are patchy infiltrates in right parahilar region and both lower lung fields, more so on the right side. Lateral CP angles are clear. There is no pneumothorax. IMPRESSION: Patchy infiltrates are seen in right mid and both lower lung fields, more so on the right side suggesting possible multifocal pneumonia. Possibility of superimposed interstitial pulmonary edema is not excluded. Electronically Signed   By: Elmer Picker M.D.   On: 02/22/2022 16:02     Echo preserved left ventricular function EF of 50 to 55%  TELEMETRY: Normal  sinus rhythm rate of 65:  ASSESSMENT AND PLAN:  Principal Problem:   Sepsis ([place]) Active Problems:   COPD (chronic obstructive pulmonary disease) (HCC)   Parkinson's disease   Chronic respiratory failure with hypoxia (HCC)   Long term (current) use of anticoagulants   Rapid atrial fibrillation (HCC)   Acute on chronic respiratory failure with hypoxia (HCC)   Multifocal pneumonia   HTN (hypertension)   Hypokalemia   Atrial fibrillation (HCC)    Plan Shortness of breath dyspnea related to COPD as well as pneumonia continue current  broad-spectrum antibiotic therapy supplemental oxygen and inhalers COPD continue inhalers and supplemental oxygen for hypoxemia consider pulmonary input Show fibrillation rate reasonably controlled continue conservative management Hypertension reasonably managed maintain current medical therapy 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

## 2022-02-24 NOTE — Progress Notes (Signed)
PROGRESS NOTE    Patrick Montoya   ZES:[id number] DOB: 1940/07/02  DOA: 02/20/2022 Date of Service: 02/24/22 PCP: Tracie Harrier, MD     Brief Narrative / Hospital Course:  81 year old male with past medical history of paroxysmal atrial fibrillation on anticoagulant Coumadin, essential hypertension, COPD on 3 days of oxygen under hospice care, Parkinson disease, BPH came to ED on 02/20/2022 with worsening shortness of breath, fever of 103 F productive cough.   10/11: in ED was found to be in atrial fibrillation with RVR, started on amiodarone and converted to NSR. Chest x-ray showed right lower lobe pneumonia. CT angiogram of the chest showed multifocal pneumonia. Patient started on IV Zosyn due to prior history of ESBL E. coli in sputum culture 10/12: Cardiology saw patient - noted Afib RVR episodes usu in setting of fever/pneumonia, responsive to calcium channel blockers - complete 24h amiodarone infusion then start amiodarone 200 mg po bid x5 days then 200 mg daily, hold metoprolol d/t hypotension. SLP - mild aspiration risk. Echo done: LVEF 50-55% G1DD 10/13: chest pressure, hypoxia d/t O2 disconnected, Afib RVR responded to metoprolol IV. Hemoptysis in AM, coumadin held. Repeat CXR (+)stable infiltrate, possible edema  10/14: Pseudomonas UTI noted, continue Zosyn. Hypoxic and SOB/CP around 17:00, tachycardic and metoprolol given converted back to NSR approx 18:00, needed breathing tx and NRB then HFNC up to 10L/min. Having dark brown sputum / hemoptysis - holding coumadin.  10/15: HFNC 8L/min - SOB. Goal wean down on O2.   Consultants:  Cardiology   Procedures: none      ASSESSMENT & PLAN:   Principal Problem:   Sepsis ([place]) Active Problems:   Multifocal pneumonia   Acute on chronic respiratory failure with hypoxia ([place])   Parkinson's disease   COPD (chronic obstructive pulmonary disease) ([place])   Chronic respiratory failure with hypoxia ([place])   Long term (current)  use of anticoagulants   Rapid atrial fibrillation (H[facility])   HTN (hypertension)   Hypokalemia   Atrial fibrillation ([facility])  Sepsis d/t pneumonia  multifocal pneumonia  Concern for aspiration pneumonia in the setting of Parkinson disease with possible dysphagia acute on chronic respiratory failure - Patient wears oxygen at baseline 3 L/min Patient started on IV Zosyn; due to prior history of sputum culture yielding ESBL E. Coli WBC trending down to 7.6 today  Speech therapy consulted for swallow evaluation Patient continues to have hemoptysis, will hold Coumadin at this time.   Chest pain - resolved Likely from hypoxemia;  Patient was found to be hypoxemic after oxygen was found to be unhooked from the wall Hypoxemia improved after hooking back oxygen to the wall source Chest pain has improved Management per cardiology   Pseudomonas UTI Urine culture growing Pseudomonas aeruginosa sensitive to Zosyn Continue IV Zosyn as above   Atrial fibrillation with RVR Patient developed hemoptysis as above, will hold anticoagulation with Coumadin at this time.   Amiodarone transition to po  Cardiology following    Parkinson disease Continue Sinemet   Hypokalemia Replete Follow BMP   Hypertension Started back on metoprolol    COPD Continue bronchodilator therapy as well as inhaled steroids     DVT prophylaxis: holding warfarin for now given hemoptysis Pertinent IV fluids/nutrition: no continuous IV fluids at this time  Central lines / invasive devices: none   Code Status: DNR Family Communication: none at this time   Disposition: inpatient TOC needs: none at this time  Barriers to discharge / significant pending items: increased O2 requirement, on  HFNC, need wean down to baseline / outpatient appropriate O2 supplementation              Subjective:  Patient reports SOB and continued dark/brown sputum production but this is improving. NO chest pain.         Objective:  Vitals:   02/24/22 0836 02/24/22 1107 02/24/22 1318 02/24/22 1507  BP: (!) 148/80 118/64 132/79 134/73  Pulse: 65 66 69 64  Resp: '18 18 18 20  '$ Temp: 97.8 F (36.6 C) 98 F (36.7 C) 98.2 F (36.8 C) 98 F (36.7 C)  TempSrc: Oral Oral    SpO2: 95% 97% 94% 96%  Weight:      Height:        Intake/Output Summary (Last 24 hours) at 02/24/2022 1549 Last data filed at 02/24/2022 1528 Gross per 24 hour  Intake 506.61 ml  Output 2650 ml  Net -2143.39 ml   Filed Weights   02/22/22 0700  Weight: 76.7 kg    Examination:  Constitutional:  VS as above General Appearance: alert, frail but not cachectic, NAD Ears, Nose, Mouth, Throat: Normal external appearance MM dry Neck: No masses, trachea midline Respiratory: Normal respiratory effort + wheeze - mild + rhonchi - diffuse + rales - bases  Cardiovascular: Irreg/irreg + murmur No rub/gallop auscultated No lower extremity edema Gastrointestinal: No tenderness Musculoskeletal:  No clubbing/cyanosis of digits Symmetrical movement in all extremities Neurological: No cranial nerve deficit on limited exam Alert Psychiatric: Normal judgment/insight Normal mood and affect       Scheduled Medications:   amiodarone  200 mg Oral BID   Followed by   Derrill Memo ON 02/27/2022] amiodarone  200 mg Oral Daily   carbidopa-levodopa  2 tablet Oral 5 X Daily   cyanocobalamin  2,000 mcg Oral Daily   ferrous gluconate  324 mg Oral Q breakfast   metoprolol tartrate  12.5 mg Oral TID   mometasone-formoterol  2 puff Inhalation BID   montelukast  10 mg Oral QHS   pantoprazole  40 mg Oral Daily   QUEtiapine  50 mg Oral QHS    Continuous Infusions:  sodium chloride 5 mL/hr at 02/24/22 1513   piperacillin-tazobactam (ZOSYN)  IV 3.375 g (02/24/22 1511)    PRN Medications:  sodium chloride, acetaminophen **OR** acetaminophen, ALPRAZolam, ipratropium-albuterol, metoprolol tartrate, ondansetron **OR**  ondansetron (ZOFRAN) IV, SUMAtriptan  Antimicrobials:  Anti-infectives (From admission, onward)    Start     Dose/Rate Route Frequency Ordered Stop   02/20/22 1500  piperacillin-tazobactam (ZOSYN) IVPB 3.375 g        3.375 g 12.5 mL/hr over 240 Minutes Intravenous Every 8 hours 02/20/22 1404     02/20/22 0945  vancomycin (VANCOCIN) IVPB 1000 mg/200 mL premix        1,000 mg 200 mL/hr over 60 Minutes Intravenous  Once 02/20/22 0943 02/20/22 1148   02/20/22 0945  ceFEPIme (MAXIPIME) 2 g in sodium chloride 0.9 % 100 mL IVPB        2 g 200 mL/hr over 30 Minutes Intravenous  Once 02/20/22 0943 02/20/22 1044       Data Reviewed: I have personally reviewed following labs and imaging studies  CBC: Recent Labs  Lab 02/20/22 1030 02/21/22 0444 02/22/22 0455 02/24/22 0549  WBC 6.9 19.9* 16.3* 7.6  NEUTROABS 5.9  --   --   --   HGB 12.5* 11.5* 10.6* 10.8*  HCT 39.8 36.5* 33.9* 33.6*  MCV 101.8* 103.1* 104.6* 102.1*  PLT 107* 97* 106* 133*  Basic Metabolic Panel: Recent Labs  Lab 02/20/22 1030 02/20/22 1457 02/21/22 0444 02/21/22 1629 02/22/22 0443 02/24/22 0549  NA 142  --  143 144  --  143  K 3.3*  --  4.7 4.8 4.9 3.7  CL 109  --  110 109  --  105  CO2 28  --  28 27  --  32  GLUCOSE 130*  --  169* 147*  --  92  BUN 24*  --  19 21  --  18  CREATININE 0.91  --  0.77 0.73  --  0.71  CALCIUM 8.1*  --  8.4* 8.5*  --  8.2*  MG  --  1.4* 2.2  --  2.3  --    GFR: Estimated Creatinine Clearance: 72.4 mL/min (by C-G formula based on SCr of 0.71 mg/dL). Liver Function Tests: Recent Labs  Lab 02/20/22 1030  AST 112*  ALT 46*  ALKPHOS 60  BILITOT 1.2  PROT 5.7*  ALBUMIN 2.8*   No results for input(s): "LIPASE", "AMYLASE" in the last 168 hours. No results for input(s): "AMMONIA" in the last 168 hours. Coagulation Profile: Recent Labs  Lab 02/20/22 1030 02/21/22 0444 02/22/22 0455 02/23/22 0733 02/24/22 0549  INR 2.3* 2.8* 2.8* 2.6* 2.2*   Cardiac Enzymes: No  results for input(s): "CKTOTAL", "CKMB", "CKMBINDEX", "TROPONINI" in the last 168 hours. BNP (last 3 results) No results for input(s): "PROBNP" in the last 8760 hours. HbA1C: No results for input(s): "HGBA1C" in the last 72 hours. CBG: No results for input(s): "GLUCAP" in the last 168 hours. Lipid Profile: No results for input(s): "CHOL", "HDL", "LDLCALC", "TRIG", "CHOLHDL", "LDLDIRECT" in the last 72 hours. Thyroid Function Tests: No results for input(s): "TSH", "T4TOTAL", "FREET4", "T3FREE", "THYROIDAB" in the last 72 hours. Anemia Panel: No results for input(s): "VITAMINB12", "FOLATE", "FERRITIN", "TIBC", "IRON", "RETICCTPCT" in the last 72 hours. Urine analysis:    Component Value Date/Time   COLORURINE YELLOW (A) 02/20/2022 1250   APPEARANCEUR CLEAR (A) 02/20/2022 1250   APPEARANCEUR Clear 10/26/2012 1721   LABSPEC >1.046 (H) 02/20/2022 1250   LABSPEC 1.021 10/26/2012 1721   PHURINE 5.0 02/20/2022 1250   GLUCOSEU NEGATIVE 02/20/2022 1250   GLUCOSEU Negative 10/26/2012 1721   HGBUR SMALL (A) 02/20/2022 1250   HGBUR negative 01/01/2008 0000   BILIRUBINUR NEGATIVE 02/20/2022 1250   BILIRUBINUR Negative 10/26/2012 1721   KETONESUR NEGATIVE 02/20/2022 1250   PROTEINUR NEGATIVE 02/20/2022 1250   UROBILINOGEN 1.0 08/29/2010 0041   NITRITE NEGATIVE 02/20/2022 1250   LEUKOCYTESUR TRACE (A) 02/20/2022 1250   LEUKOCYTESUR 3+ 10/26/2012 1721   Sepsis Labs: '@LABRCNTIP'$ (procalcitonin:4,lacticidven:4)  Recent Results (from the past 240 hour(s))  Resp Panel by RT-PCR (Flu A&B, Covid) Anterior Nasal Swab     Status: None   Collection Time: 02/20/22 10:18 AM   Specimen: Anterior Nasal Swab  Result Value Ref Range Status   SARS Coronavirus 2 by RT PCR NEGATIVE NEGATIVE Final    Comment: (NOTE) SARS-CoV-2 target nucleic acids are NOT DETECTED.  The SARS-CoV-2 RNA is generally detectable in upper respiratory specimens during the acute phase of infection. The lowest concentration of  SARS-CoV-2 viral copies this assay can detect is 138 copies/mL. A negative result does not preclude SARS-Cov-2 infection and should not be used as the sole basis for treatment or other patient management decisions. A negative result may occur with  improper specimen collection/handling, submission of specimen other than nasopharyngeal swab, presence of viral mutation(s) within the areas targeted by this assay, and  inadequate number of viral copies(<138 copies/mL). A negative result must be combined with clinical observations, patient history, and epidemiological information. The expected result is Negative.  Fact Sheet for Patients:  EntrepreneurPulse.com.au  Fact Sheet for Healthcare Providers:  IncredibleEmployment.be  This test is no t yet approved or cleared by the [place] FDA and  has been authorized for detection and/or diagnosis of SARS-CoV-2 by FDA under an Emergency Use Authorization (EUA). This EUA will remain  in effect (meaning this test can be used) for the duration of the COVID-19 declaration under Section 564(b)(1) of the Act, 21 U.S.C.section 360bbb-3(b)(1), unless the authorization is terminated  or revoked sooner.       Influenza A by PCR NEGATIVE NEGATIVE Final   Influenza B by PCR NEGATIVE NEGATIVE Final    Comment: (NOTE) The Xpert Xpress SARS-CoV-2/FLU/RSV plus assay is intended as an aid in the diagnosis of influenza from Nasopharyngeal swab specimens and should not be used as a sole basis for treatment. Nasal washings and aspirates are unacceptable for Xpert Xpress SARS-CoV-2/FLU/RSV testing.  Fact Sheet for Patients: EntrepreneurPulse.com.au  Fact Sheet for Healthcare Providers: IncredibleEmployment.be  This test is not yet approved or cleared by the [place] FDA and has been authorized for detection and/or diagnosis of SARS-CoV-2 by FDA under an Emergency Use  Authorization (EUA). This EUA will remain in effect (meaning this test can be used) for the duration of the COVID-19 declaration under Section 564(b)(1) of the Act, 21 U.S.C. section 360bbb-3(b)(1), unless the authorization is terminated or revoked.  Performed at [facility], [address] [place], [place] [address]   Blood Culture (routine x 2)     Status: None (Preliminary result)   Collection Time: 02/20/22 10:30 AM   Specimen: BLOOD  Result Value Ref Range Status   Specimen Description BLOOD RIGHT ARM  Final   Special Requests   Final    BOTTLES DRAWN AEROBIC AND ANAEROBIC Blood Culture results may not be optimal due to an inadequate volume of blood received in culture bottles   Culture   Final    NO GROWTH 4 DAYS Performed at [facility], [address] [place], [place] [address]    Report Status PENDING  Incomplete  Blood Culture (routine x 2)     Status: None (Preliminary result)   Collection Time: 02/20/22 10:30 AM   Specimen: BLOOD  Result Value Ref [place] Status   Specimen Description BLOOD LEFT ARM  Final   Special Requests   Final    BOTTLES DRAWN AEROBIC AND ANAEROBIC Blood Culture results may not be optimal due to an inadequate volume of blood received in culture bottles   Culture   Final    NO GROWTH 4 DAYS Performed at [facility], [address], [place], [place] [address]    Report Status PENDING  Incomplete  Urine Culture     Status: Abnormal   Collection Time: 02/20/22 12:50 PM   Specimen: In/Out Cath Urine  Result Value Ref Range Status   Specimen Description   Final    IN/OUT CATH URINE Performed at [facility], [address], [place], [place] [address]    Special Requests   Final    NONE Performed at [facility], [address] [place], [place] [address]    Culture 30,000 COLONIES/mL PSEUDOMONAS AERUGINOSA (A)  Final   Report Status 02/22/2022 FINAL  Final   Organism ID, Bacteria  PSEUDOMONAS AERUGINOSA (A)  Final  Susceptibility   Pseudomonas aeruginosa - MIC*    CEFTAZIDIME 2 SENSITIVE Sensitive     CIPROFLOXACIN 2 RESISTANT Resistant     GENTAMICIN <=1 SENSITIVE Sensitive     IMIPENEM 2 SENSITIVE Sensitive     PIP/TAZO <=4 SENSITIVE Sensitive     CEFEPIME 2 SENSITIVE Sensitive     * 30,000 COLONIES/mL PSEUDOMONAS AERUGINOSA         Radiology Studies: DG Chest Port 1 View  Result Date: 02/22/2022 CLINICAL DATA:  Hemoptysis EXAM: PORTABLE CHEST 1 VIEW COMPARISON:  Previous studies including the examination of 02/20/2022 FINDINGS: Transverse diameter of heart is slightly increased. Central pulmonary vessels are more prominent. There are patchy infiltrates in right parahilar region and both lower lung fields, more so on the right side. Lateral CP angles are clear. There is no pneumothorax. IMPRESSION: Patchy infiltrates are seen in right mid and both lower lung fields, more so on the right side suggesting possible multifocal pneumonia. Possibility of superimposed interstitial pulmonary edema is not excluded. Electronically Signed   By: Elmer Picker M.D.   On: 02/22/2022 16:02   ECHOCARDIOGRAM COMPLETE  Result Date: 02/21/2022    ECHOCARDIOGRAM REPORT   Patient Name:   Patrick Montoya Date of Exam: 02/21/2022 Medical Rec #:  [id number]      Height:       69.0 in Accession #:    [id number]     Weight:       175.0 lb Date of Birth:  April 28, 1941     BSA:          1.952 m Patient Age:    84 years       BP:           121/72 mmHg Patient Gender: M              HR:           69 bpm. Exam Location:  [facility] Procedure: 2D Echo, Color Doppler and Cardiac Doppler Indications:     Atrial Fibrillation I48.91  History:         Patient has prior history of Echocardiogram examinations, most                  recent 03/10/2021. COPD; Risk Factors:Hypertension.  Sonographer:     Sherrie Sport Referring Phys:  [id number] [place] TANG Diagnosing Phys: Yolonda Kida MD  Sonographer  Comments: No parasternal window. IMPRESSIONS  1. Left ventricular ejection fraction, by estimation, is 50 to 55%. The left ventricle has low normal function. The left ventricle has no regional wall motion abnormalities. There is mild concentric left ventricular hypertrophy. Left ventricular diastolic parameters are consistent with Grade I diastolic dysfunction (impaired relaxation).  2. Right ventricular systolic function is normal. The right ventricular size is normal.  3. The mitral valve is normal in structure. Trivial mitral valve regurgitation.  4. The aortic valve is normal in structure. Aortic valve regurgitation is not visualized. FINDINGS  Left Ventricle: Left ventricular ejection fraction, by estimation, is 50 to 55%. The left ventricle has low normal function. The left ventricle has no regional wall motion abnormalities. The left ventricular internal cavity size was normal in size. There is mild concentric left ventricular hypertrophy. Left ventricular diastolic parameters are consistent with Grade I diastolic dysfunction (impaired relaxation). Right Ventricle: The right ventricular size is normal. No increase in right ventricular wall thickness. Right ventricular systolic function is normal. Left Atrium: Left atrial size was normal in size. Right Atrium: Right  atrial size was normal in size. Pericardium: There is no evidence of pericardial effusion. Mitral Valve: The mitral valve is normal in structure. Trivial mitral valve regurgitation. Tricuspid Valve: The tricuspid valve is normal in structure. Tricuspid valve regurgitation is mild. Aortic Valve: The aortic valve is normal in structure. Aortic valve regurgitation is not visualized. Aortic valve mean gradient measures 8.0 mmHg. Aortic valve peak gradient measures 14.7 mmHg. Aortic valve area, by VTI measures 2.15 cm. Pulmonic Valve: The pulmonic valve was normal in structure. Pulmonic valve regurgitation is not visualized. Aorta: The ascending aorta  was not well visualized. IAS/Shunts: No atrial level shunt detected by color flow Doppler.  LEFT VENTRICLE PLAX 2D LVIDd:         3.70 cm     Diastology LVIDs:         2.90 cm     LV e' medial:    6.64 cm/s LV PW:         1.40 cm     LV E/e' medial:  13.6 LV IVS:        1.30 cm     LV e' lateral:   9.57 cm/s LVOT diam:     2.10 cm     LV E/e' lateral: 9.4 LV SV:         76 LV SV Index:   39 LVOT Area:     3.46 cm  LV Volumes (MOD) LV vol d, MOD A4C: 83.2 ml LV vol s, MOD A4C: 40.9 ml LV SV MOD A4C:     83.2 ml RIGHT VENTRICLE RV Basal diam:  4.90 cm RV Mid diam:    4.00 cm RV S prime:     14.50 cm/s TAPSE (M-mode): 2.3 cm LEFT ATRIUM             Index        RIGHT ATRIUM           Index LA diam:        3.20 cm 1.64 cm/m   RA Area:     25.70 cm LA Vol (A2C):   66.3 ml 33.96 ml/m  RA Volume:   93.90 ml  48.10 ml/m LA Vol (A4C):   51.5 ml 26.38 ml/m LA Biplane Vol: 65.0 ml 33.30 ml/m  AORTIC VALVE AV Area (Vmax):    1.91 cm AV Area (Vmean):   1.84 cm AV Area (VTI):     2.15 cm AV Vmax:           192.00 cm/s AV Vmean:          132.000 cm/s AV VTI:            0.353 m AV Peak Grad:      14.7 mmHg AV Mean Grad:      8.0 mmHg LVOT Vmax:         106.00 cm/s LVOT Vmean:        70.300 cm/s LVOT VTI:          0.219 m LVOT/AV VTI ratio: 0.62  AORTA Ao Root diam: 3.00 cm MITRAL VALVE                TRICUSPID VALVE MV Area (PHT): 3.54 cm     TR Peak grad:   20.8 mmHg MV Decel Time: 214 msec     TR Vmax:        228.00 cm/s MV E velocity: 90.10 cm/s MV A velocity: 113.00 cm/s  SHUNTS MV E/A ratio:  0.80  Systemic VTI:  0.22 m                             Systemic Diam: 2.10 cm Yolonda Kida MD Electronically signed by Yolonda Kida MD Signature Date/Time: 02/21/2022/1:11:59 PM    Final             LOS: 4 days       Emeterio Reeve, DO [facility] 02/24/2022, 3:49 PM   Staff may message me via secure chat in [place]  but this may not receive immediate response,  please page for  urgent matters!  If 7PM-7AM, please contact night-coverage [email or web address]  Dictation software was used to generate the above note. Typos may occur and escape review, as with typed/written notes. Please contact Dr Sheppard Coil directly for clarity if needed.

## 2022-02-24 NOTE — Plan of Care (Signed)
Patient remains on 8 L/min of supplemental 02 via HFNC. Persistent hemoptysis. Patient remains profoundly weak and deconditioned. Patient quickly exhibits decreased SPO2 values with activity.   Problem: Fluid Volume: Goal: Hemodynamic stability will improve Outcome: Not Progressing   Problem: Clinical Measurements: Goal: Diagnostic test results will improve Outcome: Not Progressing Goal: Signs and symptoms of infection will decrease Outcome: Not Progressing   Problem: Respiratory: Goal: Ability to maintain adequate ventilation will improve Outcome: Not Progressing   Problem: Education: Goal: Knowledge of General Education information will improve Description: Including pain rating scale, medication(s)/side effects and non-pharmacologic comfort measures Outcome: Not Progressing   Problem: Health Behavior/Discharge Planning: Goal: Ability to manage health-related needs will improve Outcome: Not Progressing   Problem: Clinical Measurements: Goal: Ability to maintain clinical measurements within normal limits will improve Outcome: Not Progressing Goal: Will remain free from infection Outcome: Not Progressing Goal: Diagnostic test results will improve Outcome: Not Progressing Goal: Respiratory complications will improve Outcome: Not Progressing Goal: Cardiovascular complication will be avoided Outcome: Not Progressing   Problem: Activity: Goal: Risk for activity intolerance will decrease Outcome: Not Progressing   Problem: Nutrition: Goal: Adequate nutrition will be maintained Outcome: Not Progressing   Problem: Coping: Goal: Level of anxiety will decrease Outcome: Not Progressing   Problem: Elimination: Goal: Will not experience complications related to bowel motility Outcome: Not Progressing Goal: Will not experience complications related to urinary retention Outcome: Not Progressing   Problem: Pain Managment: Goal: General experience of comfort will  improve Outcome: Not Progressing   Problem: Safety: Goal: Ability to remain free from injury will improve Outcome: Not Progressing   Problem: Skin Integrity: Goal: Risk for impaired skin integrity will decrease Outcome: Not Progressing

## 2022-02-24 NOTE — Hospital Course (Addendum)
81 year old male with past medical history of paroxysmal atrial fibrillation on anticoagulant Coumadin, essential hypertension, COPD on 3 days of oxygen under hospice care, Parkinson disease, BPH came to ED on 02/20/2022 with worsening shortness of breath, fever of 103 F productive cough.   10/11: in ED was found to be in atrial fibrillation with RVR, started on amiodarone and converted to NSR. Chest x-ray showed right lower lobe pneumonia. CT angiogram of the chest showed multifocal pneumonia. Patient started on IV Zosyn due to prior history of ESBL E. coli in sputum culture 10/12: Cardiology saw patient - noted Afib RVR episodes usu in setting of fever/pneumonia, responsive to calcium channel blockers - complete 24h amiodarone infusion then start amiodarone 200 mg po bid x5 days then 200 mg daily, hold metoprolol d/t hypotension. SLP - mild aspiration risk. Echo done: LVEF 50-55% G1DD 10/13: chest pressure, hypoxia d/t O2 disconnected, Afib RVR responded to metoprolol IV. Hemoptysis in AM, coumadin held. Repeat CXR (+)stable infiltrate, possible edema  10/14: Pseudomonas UTI noted, continue Zosyn. Hypoxic and SOB/CP around 17:00, tachycardic and metoprolol given converted back to NSR approx 18:00, needed breathing tx and NRB then HFNC up to 10L/min. Having dark brown sputum / hemoptysis - holding coumadin.  10/15: HFNC 8L/min - SOB. Goal wean down on O2.  10/16: remains on 8L/min this morning, down to 5L mid-day 10/17: Stable on 4L, ambulating, pt feels okay for discharge.   Consultants:  Cardiology   Procedures: none  Antibiotics: Zosyn 02/20/22 - 02/26/22        ASSESSMENT & PLAN:   Principal Problem:   Sepsis ([place]) Active Problems:   Multifocal pneumonia   Acute on chronic respiratory failure with hypoxia ([place])   Parkinson's disease   COPD (chronic obstructive pulmonary disease) ([place])   Chronic respiratory failure with hypoxia ([place])   Long term (current) use of  anticoagulants   Rapid atrial fibrillation ([facility])   HTN (hypertension)   Hypokalemia   Atrial fibrillation ([facility])  Sepsis d/t pneumonia  multifocal pneumonia  Concern for aspiration pneumonia in the setting of Parkinson disease with possible dysphagia acute on chronic respiratory failure w/ COPD - Patient wears oxygen at baseline 3 L/min Patient completed 7 days IV Zosyn; due to prior history of sputum culture yielding ESBL E. Coli Speech therapy consulted for swallow evaluation - no concerns    Chest pain - resolved Likely from hypoxemia;  Patient was found to be hypoxemic after oxygen was found to be unhooked from the wall Hypoxemia improved after hooking back oxygen to the wall source Chest pain has improved Management per cardiology   Pseudomonas UTI Urine culture growing Pseudomonas aeruginosa sensitive to Zosyn IV Zosyn as above   Atrial fibrillation with RVR - rate controlled at time of discharge  Amiodarone transition to po  Metoprolol  Cardiology following    Parkinson disease Continue Sinemet   Hypokalemia - resolved Follow BMP   Hypertension Started back on metoprolol    COPD Continue bronchodilator therapy as well as inhaled steroids

## 2022-02-25 DIAGNOSIS — A419 Sepsis, unspecified organism: Secondary | ICD-10-CM | POA: Diagnosis not present

## 2022-02-25 DIAGNOSIS — J9611 Chronic respiratory failure with hypoxia: Secondary | ICD-10-CM | POA: Diagnosis not present

## 2022-02-25 DIAGNOSIS — I4811 Longstanding persistent atrial fibrillation: Secondary | ICD-10-CM | POA: Diagnosis not present

## 2022-02-25 DIAGNOSIS — J9621 Acute and chronic respiratory failure with hypoxia: Secondary | ICD-10-CM | POA: Diagnosis not present

## 2022-02-25 LAB — CBC
HCT: 32.6 % — ABNORMAL LOW (ref 39.0–52.0)
Hemoglobin: 10.5 g/dL — ABNORMAL LOW (ref 13.0–17.0)
MCH: 32.7 pg (ref 26.0–34.0)
MCHC: 32.2 g/dL (ref 30.0–36.0)
MCV: 101.6 fL — ABNORMAL HIGH (ref 80.0–100.0)
Platelets: 142 10*3/uL — ABNORMAL LOW (ref 150–400)
RBC: 3.21 MIL/uL — ABNORMAL LOW (ref 4.22–5.81)
RDW: 15.2 % (ref 11.5–15.5)
WBC: 6.4 10*3/uL (ref 4.0–10.5)
nRBC: 0 % (ref 0.0–0.2)

## 2022-02-25 LAB — PROTIME-INR
INR: 2.1 — ABNORMAL HIGH (ref 0.8–1.2)
Prothrombin Time: 23 seconds — ABNORMAL HIGH (ref 11.4–15.2)

## 2022-02-25 LAB — BASIC METABOLIC PANEL
Anion gap: 3 — ABNORMAL LOW (ref 5–15)
BUN: 16 mg/dL (ref 8–23)
CO2: 31 mmol/L (ref 22–32)
Calcium: 8.1 mg/dL — ABNORMAL LOW (ref 8.9–10.3)
Chloride: 108 mmol/L (ref 98–111)
Creatinine, Ser: 0.71 mg/dL (ref 0.61–1.24)
GFR, Estimated: >60 mL/min (ref >60)
Glucose, Bld: 96 mg/dL (ref 70–99)
Potassium: 3.8 mmol/L (ref 3.5–5.1)
Sodium: 142 mmol/L (ref 135–145)

## 2022-02-25 LAB — CULTURE, BLOOD (ROUTINE X 2)
Culture: NO GROWTH
Culture: NO GROWTH

## 2022-02-25 MED ORDER — WARFARIN SODIUM 1 MG PO TABS
1.0000 mg | ORAL_TABLET | Freq: Once | ORAL | Status: AC
  Administered 2022-02-25: 1 mg via ORAL
  Filled 2022-02-25: qty 1

## 2022-02-25 MED ORDER — AMIODARONE HCL 200 MG PO TABS
200.0000 mg | ORAL_TABLET | Freq: Two times a day (BID) | ORAL | Status: DC
  Administered 2022-02-25 – 2022-02-26 (×2): 200 mg via ORAL
  Filled 2022-02-25 (×2): qty 1

## 2022-02-25 MED ORDER — AMIODARONE HCL 200 MG PO TABS: 100.0000 mg | ORAL_TABLET | Freq: Every day | ORAL | Status: DC

## 2022-02-25 MED ORDER — POLYVINYL ALCOHOL 1.4 % OP SOLN
1.0000 [drp] | OPHTHALMIC | Status: DC | PRN
  Filled 2022-02-25: qty 15

## 2022-02-25 MED ORDER — WARFARIN - PHARMACIST DOSING INPATIENT: Freq: Every day | Status: DC

## 2022-02-25 NOTE — Consult Note (Signed)
Patrick Montoya for Warfarin Indication: atrial fibrillation  No Known Allergies  Patient Measurements: Height: '5\' 9"'$  (175.3 cm) Weight: 76.7 kg (169 lb 1.5 oz) IBW/kg (Calculated) : 70.7   Vital Signs: Temp: 99 F (37.2 C) (10/16 1217) Temp Source: Oral (10/16 1217) BP: 115/74 (10/16 1217) Pulse Rate: 69 (10/16 1217)  Labs: Recent Labs    02/23/22 0733 02/24/22 0549 02/25/22 0437  HGB  --  10.8* 10.5*  HCT  --  33.6* 32.6*  PLT  --  133* 142*  LABPROT 27.3* 23.9* 23.0*  INR 2.6* 2.2* 2.1*  CREATININE  --  0.71 0.71    Estimated Creatinine Clearance: 71.2 mL/min (by C-G formula based on SCr of 0.71 mg/dL).   Medications:  PTA Warfarin 2.'5mg'$  daily  Assessment: Pharmacy consulted to restart warfarin. Warfarin was held due to having dark brown sputum and hemoptysis. Pt was on warfarin 2.5 mg PTA. CHADSVASc of 3. Hgb and plt stable. Pt was given warfarin 1.5 mg x 1 on 10/11 and 1 mg on 10/13. INR bumped up to 2.8 possibly due to amiodarone DDI. Amiodarone was started on 10/11. Warfarin dose will need to be decreased at discharge due to new DDI.   Goal of Therapy:  INR 2.0-3.0 Monitor platelets by anticoagulation protocol: Yes   Plan:  INR is therapeutic. Predict INR may continue to trend down tomorrow due to held warfarin doses. Will give warfarin 1 mg x 1 (decreased from home dose due to DDI). Daily INR. CBC at least every 3 days.   Patrick Montoya 02/25/2022,1:44 PM

## 2022-02-25 NOTE — Progress Notes (Signed)
[facility] Cardiology    SUBJECTIVE: Patient improving reduced shortness of breath reduced cough no fever resting comfortably   Vitals:   02/24/22 2335 02/25/22 0200 02/25/22 0508 02/25/22 0700  BP: 126/70 122/75 133/64 122/72  Pulse: 65 61 (!) 51 60  Resp: '18 19 18 '$ (!) 21  Temp: 98.4 F (36.9 C)  98.3 F (36.8 C) 98.2 F (36.8 C)  TempSrc:    Oral  SpO2: 93%  96% 93%  Weight:      Height:         Intake/Output Summary (Last 24 hours) at 02/25/2022 1123 Last data filed at 02/25/2022 0800 Gross per 24 hour  Intake 110.65 ml  Output 1525 ml  Net -1414.35 ml      PHYSICAL EXAM  General: Well developed, well nourished, in no acute distress HEENT:  Normocephalic and atramatic Neck:  No JVD.  Lungs: Clear bilaterally to auscultation and percussion. Heart: Irregularly irregular. Normal S1 and S2 without gallops or murmurs.  Abdomen: Bowel sounds are positive, abdomen soft and non-tender  Msk:  Back normal, normal gait. Normal strength and tone for age. Extremities: No clubbing, cyanosis or edema.   Neuro: Alert and oriented X 3. Psych:  Good affect, responds appropriately   LABS: Basic Metabolic Panel: Recent Labs    02/24/22 0549 02/25/22 0437  NA 143 142  K 3.7 3.8  CL 105 108  CO2 32 31  GLUCOSE 92 96  BUN 18 16  CREATININE 0.71 0.71  CALCIUM 8.2* 8.1*   Liver Function Tests: No results for input(s): "AST", "ALT", "ALKPHOS", "BILITOT", "PROT", "ALBUMIN" in the last 72 hours. No results for input(s): "LIPASE", "AMYLASE" in the last 72 hours. CBC: Recent Labs    02/24/22 0549 02/25/22 0437  WBC 7.6 6.4  HGB 10.8* 10.5*  HCT 33.6* 32.6*  MCV 102.1* 101.6*  PLT 133* 142*   Cardiac Enzymes: No results for input(s): "CKTOTAL", "CKMB", "CKMBINDEX", "TROPONINI" in the last 72 hours. BNP: Invalid input(s): "POCBNP" D-Dimer: No results for input(s): "DDIMER" in the last 72 hours. Hemoglobin A1C: No results for input(s): "HGBA1C" in the last 72  hours. Fasting Lipid Panel: No results for input(s): "CHOL", "HDL", "LDLCALC", "TRIG", "CHOLHDL", "LDLDIRECT" in the last 72 hours. Thyroid Function Tests: No results for input(s): "TSH", "T4TOTAL", "T3FREE", "THYROIDAB" in the last 72 hours.  Invalid input(s): "FREET3" Anemia Panel: No results for input(s): "VITAMINB12", "FOLATE", "FERRITIN", "TIBC", "IRON", "RETICCTPCT" in the last 72 hours.  No results found.   Echo preserved left ventricular function EF of 50 to 55%  TELEMETRY: Normal sinus rhythm rate of around 60 nonspecific ST-T wave changes   ASSESSMENT AND PLAN:  Principal Problem:   Sepsis ([place]) Active Problems:   COPD (chronic obstructive pulmonary disease) (H[facility])   Parkinson's disease   Chronic respiratory failure with hypoxia ([place])   Long term (current) use of anticoagulants   Rapid atrial fibrillation (H[facility])   Acute on chronic respiratory failure with hypoxia ([facility])   Multifocal pneumonia   HTN (hypertension)   Hypokalemia   Atrial fibrillation (H[facility])    Plan Respiratory failure chronic stable continue supplemental oxygen and inhalers symptom improving Atrial fibrillation recommend anticoagulation rate control Hypertension reasonably controlled continue current management Continue antibiotic therapy for sepsis related to pneumonia Consider rehab continue current management Maintain therapy Parkinson Agree with long-term anticoagulation because of atrial fibrillation Increase activity   Yolonda Kida, MD 02/25/2022 11:23 AM

## 2022-02-25 NOTE — Progress Notes (Signed)
PROGRESS NOTE    Patrick Montoya   IOE:[id number] DOB: [birth year]  DOA: 02/20/2022 Date of Service: 02/25/22 PCP: Tracie Harrier, MD     Brief Narrative / Hospital Course:  81 year old male with past medical history of paroxysmal atrial fibrillation on anticoagulant Coumadin, essential hypertension, COPD on 3 days of oxygen under hospice care, Parkinson disease, BPH came to ED on 02/20/2022 with worsening shortness of breath, fever of 103 F productive cough.   10/11: in ED was found to be in atrial fibrillation with RVR, started on amiodarone and converted to NSR. Chest x-ray showed right lower lobe pneumonia. CT angiogram of the chest showed multifocal pneumonia. Patient started on IV Zosyn due to prior history of ESBL E. coli in sputum culture 10/12: Cardiology saw patient - noted Afib RVR episodes usu in setting of fever/pneumonia, responsive to calcium channel blockers - complete 24h amiodarone infusion then start amiodarone 200 mg po bid x5 days then 200 mg daily, hold metoprolol d/t hypotension. SLP - mild aspiration risk. Echo done: LVEF 50-55% G1DD 10/13: chest pressure, hypoxia d/t O2 disconnected, Afib RVR responded to metoprolol IV. Hemoptysis in AM, coumadin held. Repeat CXR (+)stable infiltrate, possible edema  10/14: Pseudomonas UTI noted, continue Zosyn. Hypoxic and SOB/CP around 17:00, tachycardic and metoprolol given converted back to NSR approx 18:00, needed breathing tx and NRB then HFNC up to 10L/min. Having dark brown sputum / hemoptysis - holding coumadin.  10/15: HFNC 8L/min - SOB. Goal wean down on O2.  10/16: remains on 8L/min this morning, down to 5L mid-day  Consultants:  Cardiology   Procedures: none  Antibiotics: Zosyn 02/20/22 - now (today day 6) --> plan 7 days total        ASSESSMENT & PLAN:   Principal Problem:   Sepsis ([place]) Active Problems:   Multifocal pneumonia   Acute on chronic respiratory failure with hypoxia ([place])   Parkinson's  disease   COPD (chronic obstructive pulmonary disease) ([place])   Chronic respiratory failure with hypoxia ([place])   Long term (current) use of anticoagulants   Rapid atrial fibrillation (HCC)   HTN (hypertension)   Hypokalemia   Atrial fibrillation (HCC)  Sepsis d/t pneumonia  multifocal pneumonia  Concern for aspiration pneumonia in the setting of Parkinson disease with possible dysphagia acute on chronic respiratory failure w/ COPD - Patient wears oxygen at baseline 3 L/min Patient started on IV Zosyn; due to prior history of sputum culture yielding ESBL E. Coli WBC trending down  Speech therapy consulted for swallow evaluation - no concerns  Patient continues to have hemoptysis though this is improving, will hold Coumadin at this time.   Chest pain - resolved Likely from hypoxemia;  Patient was found to be hypoxemic after oxygen was found to be unhooked from the wall Hypoxemia improved after hooking back oxygen to the wall source Chest pain has improved Management per cardiology   Pseudomonas UTI Urine culture growing Pseudomonas aeruginosa sensitive to Zosyn Continue IV Zosyn as above   Atrial fibrillation with RVR Patient developed hemoptysis as above, will hold anticoagulation with Coumadin at this time.   Amiodarone transition to po  Metoprolol  Cardiology following    Parkinson disease Continue Sinemet   Hypokalemia - resolved Follow BMP   Hypertension Started back on metoprolol    COPD Continue bronchodilator therapy as well as inhaled steroids     DVT prophylaxis: holding warfarin for now given hemoptysis Pertinent IV fluids/nutrition: no continuous IV fluids at this time  Central lines /  invasive devices: none   Code Status: DNR Family Communication: none at this time   Disposition: inpatient TOC needs: none at this time  Barriers to discharge / significant pending items: increased O2 requirement, on HFNC, need wean down to baseline / outpatient  appropriate O2 supplementation              Subjective:  Patient reports SOB is a bit better despite coming down on suppelmetnal O2, he is still having continued dark/brown sputum production but this is improving compared to yesterday. NO chest pain.        Objective:  Vitals:   02/25/22 0200 02/25/22 0508 02/25/22 0700 02/25/22 1217  BP: 122/75 133/64 122/72 115/74  Pulse: 61 (!) 51 60 69  Resp: 19 18 (!) 21 19  Temp:  98.3 F (36.8 C) 98.2 F (36.8 C) 99 F (37.2 C)  TempSrc:   Oral Oral  SpO2:  96% 93% 90%  Weight:      Height:        Intake/Output Summary (Last 24 hours) at 02/25/2022 1316 Last data filed at 02/25/2022 1100 Gross per 24 hour  Intake 110.65 ml  Output 1925 ml  Net -1814.35 ml   Filed Weights   02/22/22 0700  Weight: 76.7 kg    Examination:  Constitutional:  VS as above General Appearance: alert, frail but not cachectic, NAD Ears, Nose, Mouth, Throat: Normal external appearance MM dry Neck: No masses, trachea midline Respiratory: Normal respiratory effort + wheeze - mild + rhonchi - diffuse + rales - bases  Cardiovascular: Irreg/irreg + murmur No rub/gallop auscultated No lower extremity edema Gastrointestinal: No tenderness Musculoskeletal:  No clubbing/cyanosis of digits Symmetrical movement in all extremities Neurological: No cranial nerve deficit on limited exam Alert Psychiatric: Normal judgment/insight Normal mood and affect       Scheduled Medications:   amiodarone  200 mg Oral BID   Followed by   Derrill Memo ON 02/27/2022] amiodarone  200 mg Oral Daily   carbidopa-levodopa  2 tablet Oral 5 X Daily   cyanocobalamin  2,000 mcg Oral Daily   ferrous gluconate  324 mg Oral Q breakfast   metoprolol tartrate  12.5 mg Oral TID   mometasone-formoterol  2 puff Inhalation BID   montelukast  10 mg Oral QHS   mouth rinse  15 mL Mouth Rinse 4 times per day   pantoprazole  40 mg Oral Daily   QUEtiapine  50 mg  Oral QHS    Continuous Infusions:  sodium chloride 10 mL/hr at 02/25/22 0507   piperacillin-tazobactam (ZOSYN)  IV 3.375 g (02/25/22 0510)    PRN Medications:  sodium chloride, acetaminophen **OR** acetaminophen, ALPRAZolam, ipratropium-albuterol, metoprolol tartrate, ondansetron **OR** ondansetron (ZOFRAN) IV, mouth rinse, polyvinyl alcohol, SUMAtriptan  Antimicrobials:  Anti-infectives (From admission, onward)    Start     Dose/Rate Route Frequency Ordered Stop   02/20/22 1500  piperacillin-tazobactam (ZOSYN) IVPB 3.375 g        3.375 g 12.5 mL/hr over 240 Minutes Intravenous Every 8 hours 02/20/22 1404     02/20/22 0945  vancomycin (VANCOCIN) IVPB 1000 mg/200 mL premix        1,000 mg 200 mL/hr over 60 Minutes Intravenous  Once 02/20/22 0943 02/20/22 1148   02/20/22 0945  ceFEPIme (MAXIPIME) 2 g in sodium chloride 0.9 % 100 mL IVPB        2 g 200 mL/hr over 30 Minutes Intravenous  Once 02/20/22 0943 02/20/22 1044       Data  Reviewed: I have personally reviewed following labs and imaging studies  CBC: Recent Labs  Lab 02/20/22 1030 02/21/22 0444 02/22/22 0455 02/24/22 0549 02/25/22 0437  WBC 6.9 19.9* 16.3* 7.6 6.4  NEUTROABS 5.9  --   --   --   --   HGB 12.5* 11.5* 10.6* 10.8* 10.5*  HCT 39.8 36.5* 33.9* 33.6* 32.6*  MCV 101.8* 103.1* 104.6* 102.1* 101.6*  PLT 107* 97* 106* 133* 830*   Basic Metabolic Panel: Recent Labs  Lab 02/20/22 1030 02/20/22 1457 02/21/22 0444 02/21/22 1629 02/22/22 0443 02/24/22 0549 02/25/22 0437  NA 142  --  143 144  --  143 142  K 3.3*  --  4.7 4.8 4.9 3.7 3.8  CL 109  --  110 109  --  105 108  CO2 28  --  28 27  --  32 31  GLUCOSE 130*  --  169* 147*  --  92 96  BUN 24*  --  19 21  --  18 16  CREATININE 0.91  --  0.77 0.73  --  0.71 0.71  CALCIUM 8.1*  --  8.4* 8.5*  --  8.2* 8.1*  MG  --  1.4* 2.2  --  2.3  --   --    GFR: Estimated Creatinine Clearance: 71.2 mL/min (by C-G formula based on SCr of 0.71 mg/dL). Liver  Function Tests: Recent Labs  Lab 02/20/22 1030  AST 112*  ALT 46*  ALKPHOS 60  BILITOT 1.2  PROT 5.7*  ALBUMIN 2.8*   No results for input(s): "LIPASE", "AMYLASE" in the last 168 hours. No results for input(s): "AMMONIA" in the last 168 hours. Coagulation Profile: Recent Labs  Lab 02/21/22 0444 02/22/22 0455 02/23/22 0733 02/24/22 0549 02/25/22 0437  INR 2.8* 2.8* 2.6* 2.2* 2.1*   Cardiac Enzymes: No results for input(s): "CKTOTAL", "CKMB", "CKMBINDEX", "TROPONINI" in the last 168 hours. BNP (last 3 results) No results for input(s): "PROBNP" in the last 8760 hours. HbA1C: No results for input(s): "HGBA1C" in the last 72 hours. CBG: No results for input(s): "GLUCAP" in the last 168 hours. Lipid Profile: No results for input(s): "CHOL", "HDL", "LDLCALC", "TRIG", "CHOLHDL", "LDLDIRECT" in the last 72 hours. Thyroid Function Tests: No results for input(s): "TSH", "T4TOTAL", "FREET4", "T3FREE", "THYROIDAB" in the last 72 hours. Anemia Panel: No results for input(s): "VITAMINB12", "FOLATE", "FERRITIN", "TIBC", "IRON", "RETICCTPCT" in the last 72 hours. Urine analysis:    Component Value Date/Time   COLORURINE YELLOW (A) 02/20/2022 1250   APPEARANCEUR CLEAR (A) 02/20/2022 1250   APPEARANCEUR Clear 10/26/2012 1721   LABSPEC >1.046 (H) 02/20/2022 1250   LABSPEC 1.021 10/26/2012 1721   PHURINE 5.0 02/20/2022 1250   GLUCOSEU NEGATIVE 02/20/2022 1250   GLUCOSEU Negative 10/26/2012 1721   HGBUR SMALL (A) 02/20/2022 1250   HGBUR negative 01/01/2008 0000   BILIRUBINUR NEGATIVE 02/20/2022 1250   BILIRUBINUR Negative 10/26/2012 1721   KETONESUR NEGATIVE 02/20/2022 1250   PROTEINUR NEGATIVE 02/20/2022 1250   UROBILINOGEN 1.0 08/29/2010 0041   NITRITE NEGATIVE 02/20/2022 1250   LEUKOCYTESUR TRACE (A) 02/20/2022 1250   LEUKOCYTESUR 3+ 10/26/2012 1721   Sepsis Labs: '@LABRCNTIP'$ (procalcitonin:4,lacticidven:4)  Recent Results (from the past 240 hour(s))  Resp Panel by RT-PCR  (Flu A&B, Covid) Anterior Nasal Swab     Status: None   Collection Time: 02/20/22 10:18 AM   Specimen: Anterior Nasal Swab  Result Value Ref Range Status   SARS Coronavirus 2 by RT PCR NEGATIVE NEGATIVE Final    Comment: (NOTE)  SARS-CoV-2 target nucleic acids are NOT DETECTED.  The SARS-CoV-2 RNA is generally detectable in upper respiratory specimens during the acute phase of infection. The lowest concentration of SARS-CoV-2 viral copies this assay can detect is 138 copies/mL. A negative result does not preclude SARS-Cov-2 infection and should not be used as the sole basis for treatment or other patient management decisions. A negative result may occur with  improper specimen collection/handling, submission of specimen other than nasopharyngeal swab, presence of viral mutation(s) within the areas targeted by this assay, and inadequate number of viral copies(<138 copies/mL). A negative result must be combined with clinical observations, patient history, and epidemiological information. The expected result is Negative.  Fact Sheet for Patients:  EntrepreneurPulse.com.au  Fact Sheet for Healthcare Providers:  IncredibleEmployment.be  This test is no t yet approved or cleared by the [place] FDA and  has been authorized for detection and/or diagnosis of SARS-CoV-2 by FDA under an Emergency Use Authorization (EUA). This EUA will remain  in effect (meaning this test can be used) for the duration of the COVID-19 declaration under Section 564(b)(1) of the Act, 21 U.S.C.section 360bbb-3(b)(1), unless the authorization is terminated  or revoked sooner.       Influenza A by PCR NEGATIVE NEGATIVE Final   Influenza B by PCR NEGATIVE NEGATIVE Final    Comment: (NOTE) The Xpert Xpress SARS-CoV-2/FLU/RSV plus assay is intended as an aid in the diagnosis of influenza from Nasopharyngeal swab specimens and should not be used as a sole basis for  treatment. Nasal washings and aspirates are unacceptable for Xpert Xpress SARS-CoV-2/FLU/RSV testing.  Fact Sheet for Patients: EntrepreneurPulse.com.au  Fact Sheet for Healthcare Providers: IncredibleEmployment.be  This test is not yet approved or cleared by the [place] FDA and has been authorized for detection and/or diagnosis of SARS-CoV-2 by FDA under an Emergency Use Authorization (EUA). This EUA will remain in effect (meaning this test can be used) for the duration of the COVID-19 declaration under Section 564(b)(1) of the Act, 21 U.S.C. section 360bbb-3(b)(1), unless the authorization is terminated or revoked.  Performed at [facility], [address] [place], [place] [address]   Blood Culture (routine x 2)     Status: None   Collection Time: 02/20/22 10:30 AM   Specimen: BLOOD  Result Value Ref Range Status   Specimen Description BLOOD RIGHT ARM  Final   Special Requests   Final    BOTTLES DRAWN AEROBIC AND ANAEROBIC Blood Culture results may not be optimal due to an inadequate volume of blood received in culture bottles   Culture   Final    NO GROWTH 5 DAYS Performed at [facility], [address] [place], [place] [address]    Report Status 02/25/2022 FINAL  Final  Blood Culture (routine x 2)     Status: None   Collection Time: 02/20/22 10:30 AM   Specimen: BLOOD  Result Value Ref Range Status   Specimen Description BLOOD LEFT ARM  Final   Special Requests   Final    BOTTLES DRAWN AEROBIC AND ANAEROBIC Blood Culture results may not be optimal due to an inadequate volume of blood received in culture bottles   Culture   Final    NO GROWTH 5 DAYS Performed at [facility], [address] [place], [place] [address]    Report Status 02/25/2022 FINAL  Final  Urine Culture     Status: Abnormal   Collection Time: 02/20/22 12:50 PM   Specimen: In/Out Cath Urine  Result Value Ref Range Status    Specimen Description   Final    IN/OUT CATH URINE Performed at [facility], [address] [place], [place] [address]    Special Requests   Final    NONE Performed at [facility], [place], [place] [address]    Culture 30,000 COLONIES/mL PSEUDOMONAS AERUGINOSA (A)  Final   Report Status 02/22/2022 FINAL  Final   Organism ID, Bacteria PSEUDOMONAS AERUGINOSA (A)  Final      Susceptibility   Pseudomonas aeruginosa - MIC*    CEFTAZIDIME 2 SENSITIVE Sensitive     CIPROFLOXACIN 2 RESISTANT Resistant     GENTAMICIN <=1 SENSITIVE Sensitive     IMIPENEM 2 SENSITIVE Sensitive     PIP/TAZO <=4 SENSITIVE Sensitive     CEFEPIME 2 SENSITIVE Sensitive     * 30,000 COLONIES/mL PSEUDOMONAS AERUGINOSA         Radiology Studies: DG Chest Port 1 View  Result Date: 02/22/2022 CLINICAL DATA:  Hemoptysis EXAM: PORTABLE CHEST 1 VIEW COMPARISON:  Previous studies including the examination of 02/20/2022 FINDINGS: Transverse diameter of heart is slightly increased. Central pulmonary vessels are more prominent. There are patchy infiltrates in right parahilar region and both lower lung fields, more so on the right side. Lateral CP angles are clear. There is no pneumothorax. IMPRESSION: Patchy infiltrates are seen in right mid and both lower lung fields, more so on the right side suggesting possible multifocal pneumonia. Possibility of superimposed interstitial pulmonary edema is not excluded. Electronically Signed   By: Elmer Picker M.D.   On: 02/22/2022 16:02   ECHOCARDIOGRAM COMPLETE  Result Date: 02/21/2022    ECHOCARDIOGRAM REPORT   Patient Name:   Patrick Montoya Date of Exam: 02/21/2022 Medical Rec #:  [id number]      Height:       69.0 in Accession #:    [id number]     Weight:       175.0 lb Date of Birth:  [birth year]     BSA:          1.952 m Patient Age:    89 years       BP:           121/72 mmHg Patient Gender: M              HR:           69 bpm. Exam  Location:  [facility] Procedure: 2D Echo, Color Doppler and Cardiac Doppler Indications:     Atrial Fibrillation I48.91  History:         Patient has prior history of Echocardiogram examinations, most                  recent 03/10/2021. COPD; Risk Factors:Hypertension.  Sonographer:     Sherrie Sport Referring Phys:  [id number] [place] TANG Diagnosing Phys: Yolonda Kida MD  Sonographer Comments: No parasternal window. IMPRESSIONS  1. Left ventricular ejection fraction, by estimation, is 50 to 55%. The left ventricle has low normal function. The left ventricle has no regional wall motion abnormalities. There is mild concentric left ventricular hypertrophy. Left ventricular diastolic parameters are consistent with Grade I diastolic dysfunction (impaired relaxation).  2. Right ventricular systolic function is normal. The right ventricular size is normal.  3. The mitral valve is normal in structure. Trivial mitral valve regurgitation.  4. The aortic valve is normal in structure. Aortic valve regurgitation is not visualized. FINDINGS  Left Ventricle:  Left ventricular ejection fraction, by estimation, is 50 to 55%. The left ventricle has low normal function. The left ventricle has no regional wall motion abnormalities. The left ventricular internal cavity size was normal in size. There is mild concentric left ventricular hypertrophy. Left ventricular diastolic parameters are consistent with Grade I diastolic dysfunction (impaired relaxation). Right Ventricle: The right ventricular size is normal. No increase in right ventricular wall thickness. Right ventricular systolic function is normal. Left Atrium: Left atrial size was normal in size. Right Atrium: Right atrial size was normal in size. Pericardium: There is no evidence of pericardial effusion. Mitral Valve: The mitral valve is normal in structure. Trivial mitral valve regurgitation. Tricuspid Valve: The tricuspid valve is normal in structure. Tricuspid valve  regurgitation is mild. Aortic Valve: The aortic valve is normal in structure. Aortic valve regurgitation is not visualized. Aortic valve mean gradient measures 8.0 mmHg. Aortic valve peak gradient measures 14.7 mmHg. Aortic valve area, by VTI measures 2.15 cm. Pulmonic Valve: The pulmonic valve was normal in structure. Pulmonic valve regurgitation is not visualized. Aorta: The ascending aorta was not well visualized. IAS/Shunts: No atrial level shunt detected by color flow Doppler.  LEFT VENTRICLE PLAX 2D LVIDd:         3.70 cm     Diastology LVIDs:         2.90 cm     LV e' medial:    6.64 cm/s LV PW:         1.40 cm     LV E/e' medial:  13.6 LV IVS:        1.30 cm     LV e' lateral:   9.57 cm/s LVOT diam:     2.10 cm     LV E/e' lateral: 9.4 LV SV:         76 LV SV Index:   39 LVOT Area:     3.46 cm  LV Volumes (MOD) LV vol d, MOD A4C: 83.2 ml LV vol s, MOD A4C: 40.9 ml LV SV MOD A4C:     83.2 ml RIGHT VENTRICLE RV Basal diam:  4.90 cm RV Mid diam:    4.00 cm RV S prime:     14.50 cm/s TAPSE (M-mode): 2.3 cm LEFT ATRIUM             Index        RIGHT ATRIUM           Index LA diam:        3.20 cm 1.64 cm/m   RA Area:     25.70 cm LA Vol (A2C):   66.3 ml 33.96 ml/m  RA Volume:   93.90 ml  48.10 ml/m LA Vol (A4C):   51.5 ml 26.38 ml/m LA Biplane Vol: 65.0 ml 33.30 ml/m  AORTIC VALVE AV Area (Vmax):    1.91 cm AV Area (Vmean):   1.84 cm AV Area (VTI):     2.15 cm AV Vmax:           192.00 cm/s AV Vmean:          132.000 cm/s AV VTI:            0.353 m AV Peak Grad:      14.7 mmHg AV Mean Grad:      8.0 mmHg LVOT Vmax:         106.00 cm/s LVOT Vmean:        70.300 cm/s LVOT VTI:  0.219 m LVOT/AV VTI ratio: 0.62  AORTA Ao Root diam: 3.00 cm MITRAL VALVE                TRICUSPID VALVE MV Area (PHT): 3.54 cm     TR Peak grad:   20.8 mmHg MV Decel Time: 214 msec     TR Vmax:        228.00 cm/s MV E velocity: 90.10 cm/s MV A velocity: 113.00 cm/s  SHUNTS MV E/A ratio:  0.80         Systemic VTI:  0.22 m                              Systemic Diam: 2.10 cm Yolonda Kida MD Electronically signed by Yolonda Kida MD Signature Date/Time: 02/21/2022/1:11:59 PM    Final             LOS: 5 days       Emeterio Reeve, DO [facility] 02/25/2022, 1:16 PM   Staff may message me via secure chat in [place]  but this may not receive immediate response,  please page for urgent matters!  If 7PM-7AM, please contact night-coverage [email or web address]  Dictation software was used to generate the above note. Typos may occur and escape review, as with typed/written notes. Please contact Dr Sheppard Coil directly for clarity if needed.

## 2022-02-25 NOTE — Progress Notes (Addendum)
[facility]) hospitalized hospice patient visit.  [name] is currnelty followed by [facility] hospice at home with a terminal diagnosis of COPD. He was admitted to [facility] on 10.11.2023 for treatment of Pneumonia and Sepsis after several days of worsening shortness of breath. [facility] was notified of the transfer to the hospital prior to his going. This is a related hospital admission per Dr. Lyman Speller, [facility] physician.  [name] has required an increase of his oxygen of up to 8 liters, from his base line of 3-4 at home.At the time of this visit he was on 6 liters  He has also required an amiodarone drip for heart rate control. He has been transitioned to oral.   Patient reports he has macular degeneration and uses wetting eye drops at home. Attending physician notified and will order artificial tears He continues on IV antibiotics for treatment of ESBL and Pseudomonas   He remains inpatient appropriate due to continued need for IV antibiotics and skilled level monitoring related the severity of his illness and medication/oxygen adjustments.  Vital signs: T 99 oral, 115/74, 69, 19. 90% on 6 L nasal cannula I/O -1125 Abnormal labs: Ca 8.1, Hb 10.5, HCT 32,6, platelets 142 Diagnostics: N/A  IV/PRN medications: Zosyn 3.375g IV q8 hours, Duo neb treatment x1  Problem list: Sepsis d/t pneumonia  multifocal pneumonia  Concern for aspiration pneumonia in the setting of Parkinson disease with possible dysphagia acute on chronic respiratory failure w/ COPD - Patient wears oxygen at baseline 3 L/min Patient started on IV Zosyn; due to prior history of sputum culture yielding ESBL E. Coli WBC trending down  Speech therapy consulted for swallow evaluation - no concerns  Patient continues to have hemoptysis though this is improving, will hold Coumadin at this time. Pseudomonas UTI Urine culture growing Pseudomonas aeruginosa sensitive to Zosyn Continue IV Zosyn as  above Atrial fibrillation with RVR Patient developed hemoptysis as above, will hold anticoagulation with Coumadin at this time.   Amiodarone transition to po  Metoprolol  Cardiology following  Parkinson disease Continue Sinemet Hypokalemia - resolved Follow BMP  Hypertension Started back on metoprolol   COPD Continue bronchodilator therapy as well as inhaled steroids.  Discharge Planning- Ongoing, patient wishes to return home once treatment completed for acute phase of Pneumonia/Sepsis Family Contact- No contact today as per patient who is alert and oriented requests IDT- Updated Goals of care - Ongoing, patient is currently DNR status while hospitalized.  Flo Shanks BSN, [employer] [phone]

## 2022-02-25 NOTE — TOC Progression Note (Signed)
Transition of Care [facility]) - Progression Note    Patient Details  Name: Patrick Montoya MRN: [id number] Date of Birth: [birth year]  Transition of Care [facility] CM/SW [place], LCSW Phone Number: 02/25/2022, 12:01 PM  Clinical Narrative:   Readmission prevention screen complete. CSW met with patient. No supports at bedside. CSW introduced role and explained that discharge planning would be discussed. PCP is Dr. Ginette Pitman. Patient takes an Surveyor, mining to appointments. He uses [facility] and they mail him his prescriptions. Patient lives home with his wife. He confirmed he is active with [facility] for home hospice services. Patient was not using DME prior to admission other than oxygen. He uses 3 L chronically. Patient stated medical staff is working on weaning him down from 8 L. No further concerns. CSW encouraged patient to contact CSW as needed. CSW will continue to follow patient for support and facilitate return home once stable. Daughter-in-law will likely transport at discharge.  Expected Discharge Plan: Home w Hospice Care Barriers to Discharge: Continued Medical Work up  Expected Discharge Plan and Services Expected Discharge Plan: [place] arrangements for the past 2 months: Single Family Home                                       Social Determinants of Health (SDOH) Interventions    Readmission Risk Interventions    02/25/2022   12:00 PM 07/25/2021    9:58 AM  Readmission Risk Prevention Plan  Transportation Screening Complete Complete  PCP or Specialist Appt within 3-5 Days Complete Complete  HRI or [place] Complete Complete  Social Work Consult for [place] Planning/Counseling Complete   Palliative Care Screening Complete Not Applicable  Medication Review Press photographer) Complete Complete

## 2022-02-26 DIAGNOSIS — J9611 Chronic respiratory failure with hypoxia: Secondary | ICD-10-CM | POA: Diagnosis not present

## 2022-02-26 DIAGNOSIS — A419 Sepsis, unspecified organism: Secondary | ICD-10-CM | POA: Diagnosis not present

## 2022-02-26 DIAGNOSIS — I4811 Longstanding persistent atrial fibrillation: Secondary | ICD-10-CM | POA: Diagnosis not present

## 2022-02-26 DIAGNOSIS — J9621 Acute and chronic respiratory failure with hypoxia: Secondary | ICD-10-CM | POA: Diagnosis not present

## 2022-02-26 LAB — PROTIME-INR
INR: 1.8 — ABNORMAL HIGH (ref 0.8–1.2)
Prothrombin Time: 21 seconds — ABNORMAL HIGH (ref 11.4–15.2)

## 2022-02-26 MED ORDER — AMIODARONE HCL 100 MG PO TABS: ORAL_TABLET | ORAL | 0 refills | Status: DC

## 2022-02-26 MED ORDER — METOPROLOL TARTRATE 25 MG PO TABS: 25.0000 mg | ORAL_TABLET | Freq: Two times a day (BID) | ORAL | 0 refills | Status: DC

## 2022-02-26 MED ORDER — ALBUTEROL SULFATE HFA 108 (90 BASE) MCG/ACT IN AERS: 2.0000 | INHALATION_SPRAY | Freq: Four times a day (QID) | RESPIRATORY_TRACT | 1 refills | Status: AC | PRN

## 2022-02-26 MED ORDER — METOPROLOL TARTRATE 25 MG PO TABS: 25.0000 mg | ORAL_TABLET | Freq: Two times a day (BID) | ORAL | Status: DC

## 2022-02-26 MED ORDER — WARFARIN SODIUM 2.5 MG PO TABS: 1.2500 mg | ORAL_TABLET | Freq: Every day | ORAL | Status: DC

## 2022-02-26 MED ORDER — WARFARIN SODIUM 1 MG PO TABS
1.5000 mg | ORAL_TABLET | Freq: Once | ORAL | Status: DC
  Filled 2022-02-26: qty 1

## 2022-02-26 MED ORDER — METOPROLOL TARTRATE 25 MG PO TABS: 12.5000 mg | ORAL_TABLET | Freq: Three times a day (TID) | ORAL | 0 refills | Status: DC

## 2022-02-26 MED ORDER — IPRATROPIUM-ALBUTEROL 0.5-2.5 (3) MG/3ML IN SOLN: 3.0000 mL | RESPIRATORY_TRACT | 0 refills | Status: AC | PRN

## 2022-02-26 NOTE — Consult Note (Signed)
Patrick Montoya for Warfarin Indication: atrial fibrillation  No Known Allergies  Patient Measurements: Height: '5\' 9"'$  (175.3 cm) Weight: 76.7 kg (169 lb 1.5 oz) IBW/kg (Calculated) : 70.7   Vital Signs: Temp: 98.3 F (36.8 C) (10/17 0730) Temp Source: Oral (10/17 0730) BP: 136/77 (10/17 0730) Pulse Rate: 76 (10/17 0730)  Labs: Recent Labs    02/24/22 0549 02/25/22 0437 02/26/22 0345  HGB 10.8* 10.5*  --   HCT 33.6* 32.6*  --   PLT 133* 142*  --   LABPROT 23.9* 23.0* 21.0*  INR 2.2* 2.1* 1.8*  CREATININE 0.71 0.71  --      Estimated Creatinine Clearance: 71.2 mL/min (by C-G formula based on SCr of 0.71 mg/dL).   Medications:  PTA Warfarin 2.'5mg'$  daily  Assessment: Pharmacy consulted to restart warfarin. Warfarin was held due to having dark brown sputum and hemoptysis. Pt was on warfarin 2.5 mg PTA. CHADSVASc of 3. Hgb and plt stable. Pt was given warfarin 1.5 mg x 1 on 10/11 and 1 mg on 10/13. INR bumped up to 2.8 possibly due to amiodarone DDI. Amiodarone was started on 10/11. Warfarin dose will need to be decreased at discharge due to new DDI, recommended 1.'25mg'$  daily as pt has 2.'5mg'$  tablets at home.   Goal of Therapy:  INR 2.0-3.0 Monitor platelets by anticoagulation protocol: Yes   Plan:  INR is subtherapeutic at 2.1 after '1mg'$  dose yesterday. Will give warfarin 1.5 mg x 1 (decreased from home dose due to DDI). Daily INR. CBC at least every 3 days.   Patrick Montoya 02/26/2022,9:52 AM

## 2022-02-26 NOTE — Care Management Important Message (Signed)
Important Message  Patient Details  Name: Patrick Montoya MRN: [id number] Date of Birth: 05-Mar-1941   Medicare Important Message Given:  Other (see comment)  Attempted to reach patient via room phone to review Medicare IM due to isolation status.  Unable to reach at this time.    Dannette Barbara 02/26/2022, 11:04 AM

## 2022-02-26 NOTE — Discharge Summary (Signed)
Physician Discharge Summary   Patient: Patrick Montoya MRN: [id number]  DOB: [birth year]   Admit:     Date of Admission: 02/20/2022 Admitted from: home hospice   Discharge: Date of discharge: 02/26/22 Disposition:  home to continue w/ hospice  Condition at discharge: fair  CODE STATUS: DNR     Discharge Physician: Emeterio Reeve, DO [facility]     PCP: Tracie Harrier, MD  Recommendations for Outpatient Follow-up:  Follow up with PCP Tracie Harrier, MD in 1-2 weeks Please obtain labs/tests: CBC, BMP in 1-2 weeks  Please follow up on the following pending results: none Monitor O2 Needs INR recheck in 1-2 days  PCP AND OTHER OUTPATIENT PROVIDERS: SEE BELOW FOR SPECIFIC DISCHARGE INSTRUCTIONS PRINTED FOR PATIENT IN ADDITION TO GENERIC AVS PATIENT INFO    Discharge Instructions     Diet - low sodium heart healthy   Complete by: As directed    Increase activity slowly   Complete by: As directed          Discharge Diagnoses: Principal Problem:   Sepsis ([place]) Active Problems:   Multifocal pneumonia   Acute on chronic respiratory failure with hypoxia ([place])   Parkinson's disease   COPD (chronic obstructive pulmonary disease) ([place])   Chronic respiratory failure with hypoxia ([place])   Long term (current) use of anticoagulants   Rapid atrial fibrillation (HCC)   HTN (hypertension)   Hypokalemia   Atrial fibrillation [facility])       Hospital Course: 81 year old male with past medical history of paroxysmal atrial fibrillation on anticoagulant Coumadin, essential hypertension, COPD on 3 days of oxygen under hospice care, Parkinson disease, BPH came to ED on 02/20/2022 with worsening shortness of breath, fever of 103 F productive cough.   10/11: in ED was found to be in atrial fibrillation with RVR, started on amiodarone and converted to NSR. Chest x-ray showed right lower lobe pneumonia. CT angiogram of the chest showed multifocal pneumonia. Patient  started on IV Zosyn due to prior history of ESBL E. coli in sputum culture 10/12: Cardiology saw patient - noted Afib RVR episodes usu in setting of fever/pneumonia, responsive to calcium channel blockers - complete 24h amiodarone infusion then start amiodarone 200 mg po bid x5 days then 200 mg daily, hold metoprolol d/t hypotension. SLP - mild aspiration risk. Echo done: LVEF 50-55% G1DD 10/13: chest pressure, hypoxia d/t O2 disconnected, Afib RVR responded to metoprolol IV. Hemoptysis in AM, coumadin held. Repeat CXR (+)stable infiltrate, possible edema  10/14: Pseudomonas UTI noted, continue Zosyn. Hypoxic and SOB/CP around 17:00, tachycardic and metoprolol given converted back to NSR approx 18:00, needed breathing tx and NRB then HFNC up to 10L/min. Having dark brown sputum / hemoptysis - holding coumadin.  10/15: HFNC 8L/min - SOB. Goal wean down on O2.  10/16: remains on 8L/min this morning, down to 5L mid-day 10/17: Stable on 4L, ambulating, pt feels okay for discharge.   Consultants:  Cardiology   Procedures: none  Antibiotics: Zosyn 02/20/22 - 02/26/22        ASSESSMENT & PLAN:   Principal Problem:   Sepsis ([place]) Active Problems:   Multifocal pneumonia   Acute on chronic respiratory failure with hypoxia ([place])   Parkinson's disease   COPD (chronic obstructive pulmonary disease) ([place])   Chronic respiratory failure with hypoxia ([place])   Long term (current) use of anticoagulants   Rapid atrial fibrillation (HCC)   HTN (hypertension)   Hypokalemia   Atrial fibrillation ([place])  Sepsis d/t  pneumonia  multifocal pneumonia  Concern for aspiration pneumonia in the setting of Parkinson disease with possible dysphagia acute on chronic respiratory failure w/ COPD - Patient wears oxygen at baseline 3 L/min Patient completed 7 days IV Zosyn; due to prior history of sputum culture yielding ESBL E. Coli Speech therapy consulted for swallow evaluation - no concerns    Chest pain -  resolved Likely from hypoxemia;  Patient was found to be hypoxemic after oxygen was found to be unhooked from the wall Hypoxemia improved after hooking back oxygen to the wall source Chest pain has improved Management per cardiology   Pseudomonas UTI Urine culture growing Pseudomonas aeruginosa sensitive to Zosyn IV Zosyn as above   Atrial fibrillation with RVR - rate controlled at time of discharge  Amiodarone transition to po  Metoprolol  Cardiology following    Parkinson disease Continue Sinemet   Hypokalemia - resolved Follow BMP   Hypertension Started back on metoprolol    COPD Continue bronchodilator therapy as well as inhaled steroids           Discharge Instructions  Allergies as of 02/26/2022   No Known Allergies      Medication List     STOP taking these medications    cefUROXime 250 MG tablet Commonly known as: CEFTIN   doxazosin 4 MG tablet Commonly known as: CARDURA   erythromycin ophthalmic ointment   metoprolol succinate 25 MG 24 hr tablet Commonly known as: TOPROL-XL   metoprolol succinate 50 MG 24 hr tablet Commonly known as: TOPROL-XL   rizatriptan 10 MG tablet Commonly known as: MAXALT   Spiriva Respimat 2.5 MCG/ACT Aers Generic drug: Tiotropium Bromide Monohydrate   tiotropium 18 MCG inhalation capsule Commonly known as: SPIRIVA       TAKE these medications    albuterol 108 (90 Base) MCG/ACT inhaler Commonly known as: VENTOLIN HFA Inhale 2 puffs into the lungs every 6 (six) hours as needed for wheezing or shortness of breath.   ALPRAZolam 0.5 MG tablet Commonly known as: XANAX Take 0.5 mg by mouth at bedtime as needed for anxiety or sleep.   amiodarone 100 MG tablet Commonly known as: PACERONE Take 2 tablets (200 mg total) by mouth at bedtime for 1 day, THEN 1 tablet (100 mg total) daily. Start taking on: February 26, 2022   budesonide-formoterol 160-4.5 MCG/ACT inhaler Commonly known as: Symbicort Inhale 2  puffs into the lungs 2 (two) times daily.   carbidopa-levodopa 25-100 MG tablet Commonly known as: SINEMET IR Take 2 tablets by mouth 5 (five) times daily.   [employer] Vitamin B-12 2000 MCG Tbcr Generic drug: Cyanocobalamin Take 2,000 mcg by mouth daily.   ferrous gluconate 324 MG tablet Commonly known as: FERGON Take 324 mg by mouth daily with breakfast.   ipratropium-albuterol 0.5-2.5 (3) MG/3ML Soln Commonly known as: DUONEB Take 3 mLs by nebulization every 2 (two) hours as needed (ehzzeing / shortness of breath). What changed: See the new instructions.   metoprolol tartrate 25 MG tablet Commonly known as: LOPRESSOR Take 1 tablet (25 mg total) by mouth 2 (two) times daily.   montelukast 10 MG tablet Commonly known as: SINGULAIR Take 10 mg by mouth at bedtime.   omeprazole 40 MG capsule Commonly known as: PRILOSEC Take 40 mg by mouth daily.   QUEtiapine 50 MG tablet Commonly known as: SEROQUEL Take 50 mg by mouth at bedtime.   SUMAtriptan 100 MG tablet Commonly known as: IMITREX Take by mouth.   warfarin 2.5 MG tablet  Commonly known as: COUMADIN Take 0.5 tablets (1.25 mg total) by mouth daily. What changed:  how much to take when to take this          No Known Allergies   Subjective: pt feeling well this morning, no CP/palpitations, no SOB   Discharge Exam: BP 111/70 (BP Location: Right Arm)   Pulse 67   Temp 98.4 F (36.9 C)   Resp 18   Ht '5\' 9"'$  (1.753 m)   Wt 76.7 kg   SpO2 91%   BMI 24.97 kg/m  General: Pt is alert, awake, not in acute distress Cardiovascular: RRR, S1/S2 +, no rubs, no gallops Respiratory: CTA bilaterally, no wheezing, no rhonchi Abdominal: Soft, NT, ND, bowel sounds + Extremities: no edema, no cyanosis     The results of significant diagnostics from this hospitalization (including imaging, microbiology, ancillary and laboratory) are listed below for reference.     Microbiology: Recent Results (from the past 240  hour(s))  Resp Panel by RT-PCR (Flu A&B, Covid) Anterior Nasal Swab     Status: None   Collection Time: 02/20/22 10:18 AM   Specimen: Anterior Nasal Swab  Result Value Ref Range Status   SARS Coronavirus 2 by RT PCR NEGATIVE NEGATIVE Final    Comment: (NOTE) SARS-CoV-2 target nucleic acids are NOT DETECTED.  The SARS-CoV-2 RNA is generally detectable in upper respiratory specimens during the acute phase of infection. The lowest concentration of SARS-CoV-2 viral copies this assay can detect is 138 copies/mL. A negative result does not preclude SARS-Cov-2 infection and should not be used as the sole basis for treatment or other patient management decisions. A negative result may occur with  improper specimen collection/handling, submission of specimen other than nasopharyngeal swab, presence of viral mutation(s) within the areas targeted by this assay, and inadequate number of viral copies(<138 copies/mL). A negative result must be combined with clinical observations, patient history, and epidemiological information. The expected result is Negative.  Fact Sheet for Patients:  EntrepreneurPulse.com.au  Fact Sheet for Healthcare Providers:  IncredibleEmployment.be  This test is no t yet approved or cleared by the [place] FDA and  has been authorized for detection and/or diagnosis of SARS-CoV-2 by FDA under an Emergency Use Authorization (EUA). This EUA will remain  in effect (meaning this test can be used) for the duration of the COVID-19 declaration under Section 564(b)(1) of the Act, 21 U.S.C.section 360bbb-3(b)(1), unless the authorization is terminated  or revoked sooner.       Influenza A by PCR NEGATIVE NEGATIVE Final   Influenza B by PCR NEGATIVE NEGATIVE Final    Comment: (NOTE) The Xpert Xpress SARS-CoV-2/FLU/RSV plus assay is intended as an aid in the diagnosis of influenza from Nasopharyngeal swab specimens and should not be  used as a sole basis for treatment. Nasal washings and aspirates are unacceptable for Xpert Xpress SARS-CoV-2/FLU/RSV testing.  Fact Sheet for Patients: EntrepreneurPulse.com.au  Fact Sheet for Healthcare Providers: IncredibleEmployment.be  This test is not yet approved or cleared by the [place] FDA and has been authorized for detection and/or diagnosis of SARS-CoV-2 by FDA under an Emergency Use Authorization (EUA). This EUA will remain in effect (meaning this test can be used) for the duration of the COVID-19 declaration under Section 564(b)(1) of the Act, 21 U.S.C. section 360bbb-3(b)(1), unless the authorization is terminated or revoked.  Performed at [facility], [address], [place], [place] [address]   Blood Culture (routine x 2)     Status: None  Collection Time: 02/20/22 10:30 AM   Specimen: BLOOD  Result Value Ref Range Status   Specimen Description BLOOD RIGHT ARM  Final   Special Requests   Final    BOTTLES DRAWN AEROBIC AND ANAEROBIC Blood Culture results may not be optimal due to an inadequate volume of blood received in culture bottles   Culture   Final    NO GROWTH 5 DAYS Performed at [facility], [address] [place], [place] [address]    Report Status 02/25/2022 FINAL  Final  Blood Culture (routine x 2)     Status: None   Collection Time: 02/20/22 10:30 AM   Specimen: BLOOD  Result Value Ref Range Status   Specimen Description BLOOD LEFT ARM  Final   Special Requests   Final    BOTTLES DRAWN AEROBIC AND ANAEROBIC Blood Culture results may not be optimal due to an inadequate volume of blood received in culture bottles   Culture   Final    NO GROWTH 5 DAYS Performed at [facility], [address] [place], [place] [address]    Report Status 02/25/2022 FINAL  Final  Urine Culture     Status: Abnormal   Collection Time: 02/20/22 12:50 PM   Specimen: In/Out Cath Urine  Result  Value Ref Range Status   Specimen Description   Final    IN/OUT CATH URINE Performed at [facility], [address] [place], [place] [address]    Special Requests   Final    NONE Performed at [facility], [address] [place], [place] [address]    Culture 30,000 COLONIES/mL PSEUDOMONAS AERUGINOSA (A)  Final   Report Status 02/22/2022 FINAL  Final   Organism ID, Bacteria PSEUDOMONAS AERUGINOSA (A)  Final      Susceptibility   Pseudomonas aeruginosa - MIC*    CEFTAZIDIME 2 SENSITIVE Sensitive     CIPROFLOXACIN 2 RESISTANT Resistant     GENTAMICIN <=1 SENSITIVE Sensitive     IMIPENEM 2 SENSITIVE Sensitive     PIP/TAZO <=4 SENSITIVE Sensitive     CEFEPIME 2 SENSITIVE Sensitive     * 30,000 COLONIES/mL PSEUDOMONAS AERUGINOSA     Labs: BNP (last 3 results) Recent Labs    03/09/21 0134 06/29/21 0114 08/01/21 1045  BNP 45.3 45.9 33.2   Basic Metabolic Panel: Recent Labs  Lab 02/20/22 1030 02/20/22 1457 02/21/22 0444 02/21/22 1629 02/22/22 0443 02/24/22 0549 02/25/22 0437  NA 142  --  143 144  --  143 142  K 3.3*  --  4.7 4.8 4.9 3.7 3.8  CL 109  --  110 109  --  105 108  CO2 28  --  28 27  --  32 31  GLUCOSE 130*  --  169* 147*  --  92 96  BUN 24*  --  19 21  --  18 16  CREATININE 0.91  --  0.77 0.73  --  0.71 0.71  CALCIUM 8.1*  --  8.4* 8.5*  --  8.2* 8.1*  MG  --  1.4* 2.2  --  2.3  --   --    Liver Function Tests: Recent Labs  Lab 02/20/22 1030  AST 112*  ALT 46*  ALKPHOS 60  BILITOT 1.2  PROT 5.7*  ALBUMIN 2.8*   No results for input(s): "LIPASE", "AMYLASE" in the last 168 hours. No results for input(s): "AMMONIA" in the last 168 hours. CBC: Recent Labs  Lab 02/20/22 1030 02/21/22 0444 02/22/22  5462 02/24/22 0549 02/25/22 0437  WBC 6.9 19.9* 16.3* 7.6 6.4  NEUTROABS 5.9  --   --   --   --   HGB 12.5* 11.5* 10.6* 10.8* 10.5*  HCT 39.8 36.5* 33.9* 33.6* 32.6*  MCV 101.8* 103.1* 104.6* 102.1* 101.6*  PLT 107* 97* 106*  133* 142*   Cardiac Enzymes: No results for input(s): "CKTOTAL", "CKMB", "CKMBINDEX", "TROPONINI" in the last 168 hours. BNP: Invalid input(s): "POCBNP" CBG: No results for input(s): "GLUCAP" in the last 168 hours. D-Dimer No results for input(s): "DDIMER" in the last 72 hours. Hgb A1c No results for input(s): "HGBA1C" in the last 72 hours. Lipid Profile No results for input(s): "CHOL", "HDL", "LDLCALC", "TRIG", "CHOLHDL", "LDLDIRECT" in the last 72 hours. Thyroid function studies No results for input(s): "TSH", "T4TOTAL", "T3FREE", "THYROIDAB" in the last 72 hours.  Invalid input(s): "FREET3" Anemia work up No results for input(s): "VITAMINB12", "FOLATE", "FERRITIN", "TIBC", "IRON", "RETICCTPCT" in the last 72 hours. Urinalysis    Component Value Date/Time   COLORURINE YELLOW (A) 02/20/2022 1250   APPEARANCEUR CLEAR (A) 02/20/2022 1250   APPEARANCEUR Clear 10/26/2012 1721   LABSPEC >1.046 (H) 02/20/2022 1250   LABSPEC 1.021 10/26/2012 1721   PHURINE 5.0 02/20/2022 1250   GLUCOSEU NEGATIVE 02/20/2022 1250   GLUCOSEU Negative 10/26/2012 1721   HGBUR SMALL (A) 02/20/2022 1250   HGBUR negative 01/01/2008 0000   BILIRUBINUR NEGATIVE 02/20/2022 1250   BILIRUBINUR Negative 10/26/2012 1721   KETONESUR NEGATIVE 02/20/2022 1250   PROTEINUR NEGATIVE 02/20/2022 1250   UROBILINOGEN 1.0 08/29/2010 0041   NITRITE NEGATIVE 02/20/2022 1250   LEUKOCYTESUR TRACE (A) 02/20/2022 1250   LEUKOCYTESUR 3+ 10/26/2012 1721   Sepsis Labs Recent Labs  Lab 02/21/22 0444 02/22/22 0455 02/24/22 0549 02/25/22 0437  WBC 19.9* 16.3* 7.6 6.4   Microbiology Recent Results (from the past 240 hour(s))  Resp Panel by RT-PCR (Flu A&B, Covid) Anterior Nasal Swab     Status: None   Collection Time: 02/20/22 10:18 AM   Specimen: Anterior Nasal Swab  Result Value Ref Range Status   SARS Coronavirus 2 by RT PCR NEGATIVE NEGATIVE Final    Comment: (NOTE) SARS-CoV-2 target nucleic acids are NOT  DETECTED.  The SARS-CoV-2 RNA is generally detectable in upper respiratory specimens during the acute phase of infection. The lowest concentration of SARS-CoV-2 viral copies this assay can detect is 138 copies/mL. A negative result does not preclude SARS-Cov-2 infection and should not be used as the sole basis for treatment or other patient management decisions. A negative result may occur with  improper specimen collection/handling, submission of specimen other than nasopharyngeal swab, presence of viral mutation(s) within the areas targeted by this assay, and inadequate number of viral copies(<138 copies/mL). A negative result must be combined with clinical observations, patient history, and epidemiological information. The expected result is Negative.  Fact Sheet for Patients:  EntrepreneurPulse.com.au  Fact Sheet for Healthcare Providers:  IncredibleEmployment.be  This test is no t yet approved or cleared by the [place] FDA and  has been authorized for detection and/or diagnosis of SARS-CoV-2 by FDA under an Emergency Use Authorization (EUA). This EUA will remain  in effect (meaning this test can be used) for the duration of the COVID-19 declaration under Section 564(b)(1) of the Act, 21 U.S.C.section 360bbb-3(b)(1), unless the authorization is terminated  or revoked sooner.       Influenza A by PCR NEGATIVE NEGATIVE Final   Influenza B by PCR NEGATIVE NEGATIVE Final    Comment: (  NOTE) The Xpert Xpress SARS-CoV-2/FLU/RSV plus assay is intended as an aid in the diagnosis of influenza from Nasopharyngeal swab specimens and should not be used as a sole basis for treatment. Nasal washings and aspirates are unacceptable for Xpert Xpress SARS-CoV-2/FLU/RSV testing.  Fact Sheet for Patients: EntrepreneurPulse.com.au  Fact Sheet for Healthcare Providers: IncredibleEmployment.be  This test is not yet  approved or cleared by the [place] FDA and has been authorized for detection and/or diagnosis of SARS-CoV-2 by FDA under an Emergency Use Authorization (EUA). This EUA will remain in effect (meaning this test can be used) for the duration of the COVID-19 declaration under Section 564(b)(1) of the Act, 21 U.S.C. section 360bbb-3(b)(1), unless the authorization is terminated or revoked.  Performed at [facility], [address] [place], [place] [address]   Blood Culture (routine x 2)     Status: None   Collection Time: 02/20/22 10:30 AM   Specimen: BLOOD  Result Value Ref Range Status   Specimen Description BLOOD RIGHT ARM  Final   Special Requests   Final    BOTTLES DRAWN AEROBIC AND ANAEROBIC Blood Culture results may not be optimal due to an inadequate volume of blood received in culture bottles   Culture   Final    NO GROWTH 5 DAYS Performed at [facility], [address] [place], [place] [address]    Report Status 02/25/2022 FINAL  Final  Blood Culture (routine x 2)     Status: None   Collection Time: 02/20/22 10:30 AM   Specimen: BLOOD  Result Value Ref Range Status   Specimen Description BLOOD LEFT ARM  Final   Special Requests   Final    BOTTLES DRAWN AEROBIC AND ANAEROBIC Blood Culture results may not be optimal due to an inadequate volume of blood received in culture bottles   Culture   Final    NO GROWTH 5 DAYS Performed at [facility], [address] [place], [place] [address]    Report Status 02/25/2022 FINAL  Final  Urine Culture     Status: Abnormal   Collection Time: 02/20/22 12:50 PM   Specimen: In/Out Cath Urine  Result Value Ref Range Status   Specimen Description   Final    IN/OUT CATH URINE Performed at [facility], [address] [place], [place] [address]    Special Requests   Final    NONE Performed at [facility], [place],  [address]    Culture 30,000  COLONIES/mL PSEUDOMONAS AERUGINOSA (A)  Final   Report Status 02/22/2022 FINAL  Final   Organism ID, Bacteria PSEUDOMONAS AERUGINOSA (A)  Final      Susceptibility   Pseudomonas aeruginosa - MIC*    CEFTAZIDIME 2 SENSITIVE Sensitive     CIPROFLOXACIN 2 RESISTANT Resistant     GENTAMICIN <=1 SENSITIVE Sensitive     IMIPENEM 2 SENSITIVE Sensitive     PIP/TAZO <=4 SENSITIVE Sensitive     CEFEPIME 2 SENSITIVE Sensitive     * 30,000 COLONIES/mL PSEUDOMONAS AERUGINOSA   Imaging DG Chest Port 1 View  Result Date: 02/22/2022 CLINICAL DATA:  Hemoptysis EXAM: PORTABLE CHEST 1 VIEW COMPARISON:  Previous studies including the examination of 02/20/2022 FINDINGS: Transverse diameter of heart is slightly increased. Central pulmonary vessels are more prominent. There are patchy infiltrates in right parahilar region and both lower lung fields, more so on the right side. Lateral CP angles are clear. There is no pneumothorax. IMPRESSION: Patchy infiltrates are  seen in right mid and both lower lung fields, more so on the right side suggesting possible multifocal pneumonia. Possibility of superimposed interstitial pulmonary edema is not excluded. Electronically Signed   By: Elmer Picker M.D.   On: 02/22/2022 16:02   ECHOCARDIOGRAM COMPLETE  Result Date: 02/21/2022    ECHOCARDIOGRAM REPORT   Patient Name:   Patrick Montoya Date of Exam: 02/21/2022 Medical Rec #:  [id number]      Height:       69.0 in Accession #:    [id number]     Weight:       175.0 lb Date of Birth:  06/05/1940     BSA:          1.952 m Patient Age:    46 years       BP:           121/72 mmHg Patient Gender: M              HR:           69 bpm. Exam Location:  [facility] Procedure: 2D Echo, Color Doppler and Cardiac Doppler Indications:     Atrial Fibrillation I48.91  History:         Patient has prior history of Echocardiogram examinations, most                  recent 03/10/2021. COPD; Risk Factors:Hypertension.  Sonographer:     Sherrie Sport  Referring Phys:  [id number] [place] TANG Diagnosing Phys: Yolonda Kida MD  Sonographer Comments: No parasternal window. IMPRESSIONS  1. Left ventricular ejection fraction, by estimation, is 50 to 55%. The left ventricle has low normal function. The left ventricle has no regional wall motion abnormalities. There is mild concentric left ventricular hypertrophy. Left ventricular diastolic parameters are consistent with Grade I diastolic dysfunction (impaired relaxation).  2. Right ventricular systolic function is normal. The right ventricular size is normal.  3. The mitral valve is normal in structure. Trivial mitral valve regurgitation.  4. The aortic valve is normal in structure. Aortic valve regurgitation is not visualized. FINDINGS  Left Ventricle: Left ventricular ejection fraction, by estimation, is 50 to 55%. The left ventricle has low normal function. The left ventricle has no regional wall motion abnormalities. The left ventricular internal cavity size was normal in size. There is mild concentric left ventricular hypertrophy. Left ventricular diastolic parameters are consistent with Grade I diastolic dysfunction (impaired relaxation). Right Ventricle: The right ventricular size is normal. No increase in right ventricular wall thickness. Right ventricular systolic function is normal. Left Atrium: Left atrial size was normal in size. Right Atrium: Right atrial size was normal in size. Pericardium: There is no evidence of pericardial effusion. Mitral Valve: The mitral valve is normal in structure. Trivial mitral valve regurgitation. Tricuspid Valve: The tricuspid valve is normal in structure. Tricuspid valve regurgitation is mild. Aortic Valve: The aortic valve is normal in structure. Aortic valve regurgitation is not visualized. Aortic valve mean gradient measures 8.0 mmHg. Aortic valve peak gradient measures 14.7 mmHg. Aortic valve area, by VTI measures 2.15 cm. Pulmonic Valve: The pulmonic valve was  normal in structure. Pulmonic valve regurgitation is not visualized. Aorta: The ascending aorta was not well visualized. IAS/Shunts: No atrial level shunt detected by color flow Doppler.  LEFT VENTRICLE PLAX 2D LVIDd:         3.70 cm     Diastology LVIDs:         2.90 cm  LV e' medial:    6.64 cm/s LV PW:         1.40 cm     LV E/e' medial:  13.6 LV IVS:        1.30 cm     LV e' lateral:   9.57 cm/s LVOT diam:     2.10 cm     LV E/e' lateral: 9.4 LV SV:         76 LV SV Index:   39 LVOT Area:     3.46 cm  LV Volumes (MOD) LV vol d, MOD A4C: 83.2 ml LV vol s, MOD A4C: 40.9 ml LV SV MOD A4C:     83.2 ml RIGHT VENTRICLE RV Basal diam:  4.90 cm RV Mid diam:    4.00 cm RV S prime:     14.50 cm/s TAPSE (M-mode): 2.3 cm LEFT ATRIUM             Index        RIGHT ATRIUM           Index LA diam:        3.20 cm 1.64 cm/m   RA Area:     25.70 cm LA Vol (A2C):   66.3 ml 33.96 ml/m  RA Volume:   93.90 ml  48.10 ml/m LA Vol (A4C):   51.5 ml 26.38 ml/m LA Biplane Vol: 65.0 ml 33.30 ml/m  AORTIC VALVE AV Area (Vmax):    1.91 cm AV Area (Vmean):   1.84 cm AV Area (VTI):     2.15 cm AV Vmax:           192.00 cm/s AV Vmean:          132.000 cm/s AV VTI:            0.353 m AV Peak Grad:      14.7 mmHg AV Mean Grad:      8.0 mmHg LVOT Vmax:         106.00 cm/s LVOT Vmean:        70.300 cm/s LVOT VTI:          0.219 m LVOT/AV VTI ratio: 0.62  AORTA Ao Root diam: 3.00 cm MITRAL VALVE                TRICUSPID VALVE MV Area (PHT): 3.54 cm     TR Peak grad:   20.8 mmHg MV Decel Time: 214 msec     TR Vmax:        228.00 cm/s MV E velocity: 90.10 cm/s MV A velocity: 113.00 cm/s  SHUNTS MV E/A ratio:  0.80         Systemic VTI:  0.22 m                             Systemic Diam: 2.10 cm Dwayne Prince Rome MD Electronically signed by Yolonda Kida MD Signature Date/Time: 02/21/2022/1:11:59 PM    Final       Time coordinating discharge: over 30 minutes  SIGNED:  Emeterio Reeve DO [facility]

## 2022-02-26 NOTE — Evaluation (Signed)
Physical Therapy Evaluation Patient Details Name: OTHA MONICAL MRN: [id number] DOB: Nov 24, 1940 Today's Date: 02/26/2022  History of Present Illness  81 y.o.  male presenting to ED 3/12 with worsening dyspnea, productive cough and wheezing x4 days. Patient admitted with COPD exacerbation with acute on chronic respiratory failure. PMHx significant for COPD on 3L home O2, Hep D, BPH, A-fib, HTN, Parkinson's disease and PUD.   Clinical Impression  Pt admitted with above diagnosis. Pt received upright in bed agreeable to PT services. Pt reports baseline indep with ambulation in home and short community distances. He takes care of his wife and both of them receive hospice services.   To date pt resting on 4L/min performing bed mobility at supervision level and standing to IV pole and ambulating 2x20' bouts with seated rest b/t bouts at Eob with supervision. Pt pushing IV pole more for lines/lead management than for stability. Notable desat to 85% on 4L/min taking 2-3 min seated rest to return to >/= 90% with pt asymptomatic and HR maintaining in 80's with gait. During seated rest pt educated on utilizing family support and hospice support for wife's needs while pt is recovering for pt and spouse safety and reduced caregiver burden. Pt understanding with all needs in place in recliner. Pt would benefit from [facility] PT to improve endurance and balance to return to PLOF. Pt currently with functional limitations due to the deficits listed below (see PT Problem List). Pt will benefit from skilled PT to increase their independence and safety with mobility to allow discharge to the venue listed below.          Recommendations for follow up therapy are one component of a multi-disciplinary discharge planning process, led by the attending physician.  Recommendations may be updated based on patient status, additional functional criteria and insurance authorization.  Follow Up Recommendations Home health PT       Assistance Recommended at Discharge Intermittent Supervision/Assistance  Patient can return home with the following  A little help with walking and/or transfers;Assist for transportation;Help with stairs or ramp for entrance    Equipment Recommendations None recommended by PT  Recommendations for Other Services       Functional Status Assessment Patient has had a recent decline in their functional status and demonstrates the ability to make significant improvements in function in a reasonable and predictable amount of time.     Precautions / Restrictions Precautions Precautions: Fall Restrictions Weight Bearing Restrictions: No      Mobility  Bed Mobility Overal bed mobility: Needs Assistance Bed Mobility: Supine to Sit     Supine to sit: Supervision       Patient Response: Cooperative  Transfers Overall transfer level: Needs assistance Equipment used: None Transfers: Sit to/from Stand Sit to Stand: Supervision                Ambulation/Gait Ambulation/Gait assistance: Supervision Gait Distance (Feet): 40 Feet Assistive device: IV Pole Gait Pattern/deviations: Step-through pattern, Decreased step length - right, Decreased step length - left       General Gait Details: Pt lightly pushing IV pole more for line/lead management than stability.  Stairs            Wheelchair Mobility    Modified Rankin (Stroke Patients Only)       Balance Overall balance assessment: Needs assistance Sitting-balance support: Bilateral upper extremity supported, Feet supported Sitting balance-Leahy Scale: Good       Standing balance-Leahy Scale: Fair Standing balance comment: maintains static standing  without need for UE support                             Pertinent Vitals/Pain Pain Assessment Pain Assessment: No/denies pain    Home Living Family/patient expects to be discharged to:: Private residence Living Arrangements: Spouse/significant  other Available Help at Discharge: Family;Available PRN/intermittently Type of Home: House Home Access: Stairs to enter Entrance Stairs-Rails: None Entrance Stairs-Number of Steps: 1 plus small threshold   Home Layout: One level Home Equipment: Shower seat;BSC/3in1;Grab bars - tub/shower;Rolling Walker (2 wheels);Cane - single point;Other (comment) (home O2)      Prior Function Prior Level of Function : Independent/Modified Independent             Mobility Comments: No AD ambulating HH distances ADLs Comments: Indep. Assists wife     Hand Dominance        Extremity/Trunk Assessment   Upper Extremity Assessment Upper Extremity Assessment: Overall WFL for tasks assessed    Lower Extremity Assessment Lower Extremity Assessment: Generalized weakness    Cervical / Trunk Assessment Cervical / Trunk Assessment: Normal  Communication   Communication: HOH  Cognition Arousal/Alertness: Awake/alert Behavior During Therapy: WFL for tasks assessed/performed Overall Cognitive Status: Within Functional Limits for tasks assessed                                          General Comments General comments (skin integrity, edema, etc.): 4L/min desat to 85-86% post gait. 2-3 min seated rest to return to 90%. HR in 80's BPM with gait.    Exercises Other Exercises Other Exercises: LE therex to assist in strengthening. Role of PT in acute setting, d/c recs.   Assessment/Plan    PT Assessment Patient needs continued PT services  PT Problem List Decreased strength;Decreased activity tolerance       PT Treatment Interventions Balance training;DME instruction;Gait training;Neuromuscular re-education;Stair training;Patient/family education;Functional mobility training;Therapeutic activities;Therapeutic exercise    PT Goals (Current goals can be found in the Care Plan section)  Acute Rehab PT Goals Patient Stated Goal: to go home PT Goal Formulation: With  patient Time For Goal Achievement: 03/12/22 Potential to Achieve Goals: Good    Frequency Min 2X/week     Co-evaluation               AM-PAC PT "6 Clicks" Mobility  Outcome Measure Help needed turning from your back to your side while in a flat bed without using bedrails?: A Little Help needed moving from lying on your back to sitting on the side of a flat bed without using bedrails?: A Little Help needed moving to and from a bed to a chair (including a wheelchair)?: A Little Help needed standing up from a chair using your arms (e.g., wheelchair or bedside chair)?: None Help needed to walk in hospital room?: A Little Help needed climbing 3-5 steps with a railing? : A Little 6 Click Score: 19    End of Session Equipment Utilized During Treatment: Gait belt;Oxygen Activity Tolerance: Patient tolerated treatment well Patient left: in chair;with call bell/phone within reach Nurse Communication: Mobility status PT Visit Diagnosis: Other abnormalities of gait and mobility (R26.89);Muscle weakness (generalized) (M62.81)    Time: 1610-9604 PT Time Calculation (min) (ACUTE ONLY): 29 min   Charges:   PT Evaluation $PT Eval Moderate Complexity: 1 Mod PT Treatments $Therapeutic Activity: 8-22  mins        [name] Caster. Fairly IV, PT, DPT Physical Therapist- [facility]  02/26/2022, 10:55 AM

## 2022-02-26 NOTE — Progress Notes (Signed)
OT Cancellation Note  Patient Details Name: Patrick Montoya MRN: [id number] DOB: [birth year]   Cancelled Treatment:    Reason Eval/Treat Not Completed: Other (comment) (Lunch tray arrived.  Pt preferred to eat and stated he was waiting for Hospice to transfer him back home.  RN confirms pt has been discharged.)  Leta Speller, MS, OTR/L  Patrick Montoya 02/26/2022, 2:57 PM

## 2022-02-26 NOTE — Progress Notes (Signed)
[place] NOTE       Patient ID: Patrick Montoya MRN: [id number] DOB/AGE: 1941-02-16 81 y.o.  Admit date: 02/20/2022 Referring Physician Dr. Francine Graven Primary Physician Dr. Ginette Pitman Primary Cardiologist Dr. Nehemiah Massed (prev Dr. 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  He was found to be in atrial fibrillation with RVR on admission with heart rates peaking in the 170s, for which cardiology is consulted for further assistance.  Interval History:  -Continues to have intermittent paroxysms of A-fib with RVR with rates in the 120s to 130s, predominantly in sinus rhythm with rate in the 80s, -Feels like his breathing is better today, back down to 4 L (baseline of 3 prior to admission) -Denies chest pain, palpitations or heart racing   Review of systems complete and found to be negative unless listed above     Past Medical History:  Diagnosis Date   Basal cell carcinoma 07/25/2015   L zygoma inf to lat canthus   Basal cell carcinoma 06/11/2018   L nasal ala   Basal cell carcinoma 10/04/2019   Left nose. Infiltrative pattern.   BPH (benign prostatic hyperplasia)    COPD (chronic obstructive pulmonary disease) (HCC)    Elevated liver enzymes    Emphysema of lung (HCC)    Gastric ulcer    GERD (gastroesophageal reflux disease)    Hepatitis B    Hypertension    Parkinson's disease    Prostate hypertrophy    Rotator cuff tear right   UTI (urinary tract infection)     Past Surgical History:  Procedure Laterality Date   CHOLECYSTECTOMY     COLONOSCOPY WITH PROPOFOL N/A 10/20/2015   Procedure: COLONOSCOPY WITH PROPOFOL;  Surgeon: Manya Silvas, MD;   Location: [facility];  Service: Endoscopy;  Laterality: N/A;   COLONOSCOPY WITH PROPOFOL N/A 01/24/2021   Procedure: COLONOSCOPY WITH PROPOFOL;  Surgeon: Toledo, Benay Pike, MD;  Location: [facility] ENDOSCOPY;  Service: Gastroenterology;  Laterality: N/A;   ESOPHAGOGASTRODUODENOSCOPY (EGD) WITH PROPOFOL N/A 10/20/2015   Procedure: ESOPHAGOGASTRODUODENOSCOPY (EGD) WITH PROPOFOL;  Surgeon: Manya Silvas, MD;  Location: [facility] ENDOSCOPY;  Service: Endoscopy;  Laterality: N/A;   GREEN LIGHT LASER TURP (TRANSURETHRAL RESECTION OF PROSTATE N/A 11/17/2018   Procedure: GREEN LIGHT LASER TURP (TRANSURETHRAL RESECTION OF PROSTATE;  Surgeon: Royston Cowper, MD;  Location: [facility] ORS;  Service: Urology;  Laterality: N/A;   INGUINAL HERNIA REPAIR Right 02/18/2019   Procedure: HERNIA REPAIR INGUINAL ADULT;  Surgeon: Royston Cowper, MD;  Location: [facility] ORS;  Service: Urology;  Laterality: Right;   INSERTION OF MESH Right 02/18/2019   Procedure: INSERTION OF MESH;  Surgeon: Royston Cowper, MD;  Location: [facility] ORS;  Service: Urology;  Laterality: Right;   TONSILLECTOMY     TRANSURETHRAL MICROWAVE THERAPY      Medications Prior to Admission  Medication Sig Dispense Refill Last Dose   ALPRAZolam (XANAX) 0.5 MG tablet Take 0.5 mg by mouth at bedtime as needed for anxiety or sleep.   02/19/2022   carbidopa-levodopa (SINEMET IR) 25-100 MG tablet Take 2 tablets by mouth 5 (five) times daily.   02/19/2022   Cyanocobalamin ([employer] VITAMIN B-12) 2000 MCG TBCR Take 2,000  mcg by mouth daily.   02/19/2022   doxazosin (CARDURA) 4 MG tablet Take 4 mg by mouth daily.    02/19/2022   erythromycin ophthalmic ointment APPLY A SMALL AMOUNT INTO LEFT EYE AT BEDTIME   02/19/2022   ferrous gluconate (FERGON) 324 MG tablet Take 324 mg by mouth daily with breakfast.   02/19/2022   metoprolol succinate (TOPROL-XL) 25 MG 24 hr tablet Take 25 mg by mouth 2 (two) times daily.   02/19/2022   montelukast (SINGULAIR) 10 MG tablet Take 10 mg by  mouth at bedtime.   02/19/2022   omeprazole (PRILOSEC) 40 MG capsule Take 40 mg by mouth daily.    02/19/2022   QUEtiapine (SEROQUEL) 50 MG tablet Take 50 mg by mouth at bedtime.   02/19/2022   SPIRIVA RESPIMAT 2.5 MCG/ACT AERS Inhale 2 puffs into the lungs daily.   02/19/2022   warfarin (COUMADIN) 2.5 MG tablet Take 2.5 mg by mouth daily at 4 PM.   02/19/2022 at 1600   albuterol (VENTOLIN HFA) 108 (90 Base) MCG/ACT inhaler Inhale 2 puffs into the lungs every 6 (six) hours as needed for wheezing or shortness of breath. 18 g 11 prn at prn   budesonide-formoterol (SYMBICORT) 160-4.5 MCG/ACT inhaler Inhale 2 puffs into the lungs 2 (two) times daily. 10.2 g 3 prn at prn   cefUROXime (CEFTIN) 250 MG tablet Take 250 mg by mouth 2 (two) times daily. (Patient not taking: Reported on 02/20/2022)   Not Taking   ipratropium-albuterol (DUONEB) 0.5-2.5 (3) MG/3ML SOLN SMARTSIG:3 Milliliter(s) Via Nebulizer Every 6 Hours PRN   prn at prn   metoprolol succinate (TOPROL-XL) 50 MG 24 hr tablet Take 1 tablet (50 mg total) by mouth at bedtime. Take with or immediately following a meal. (Patient not taking: Reported on 02/20/2022) 90 tablet 3 Not Taking   rizatriptan (MAXALT) 10 MG tablet Take 10 mg by mouth as needed for migraine. May repeat in 2 hours if needed   prn at prn   SUMAtriptan (IMITREX) 100 MG tablet Take by mouth.   prn at prn   tiotropium (SPIRIVA) 18 MCG inhalation capsule Place 1 capsule (18 mcg total) into inhaler and inhale daily. (Patient not taking: Reported on 02/20/2022) 30 capsule 12 Not Taking    Social History   Socioeconomic History   Marital status: Married    Spouse name: Not on file   Number of children: Not on file   Years of education: Not on file   Highest education level: Not on file  Occupational History   Not on file  Tobacco Use   Smoking status: Former    Packs/day: 1.00    Years: 62.00    Total pack years: 62.00    Types: Cigarettes   Smokeless tobacco: Never   Vaping Use   Vaping Use: Never used  Substance and Sexual Activity   Alcohol use: Yes    Comment: occasional   Drug use: No   Sexual activity: Not on file  Other Topics Concern   Not on file  Social History Narrative   Not on file   Social Determinants of Health   Financial Resource Strain: Not on file  Food Insecurity: No Food Insecurity (02/21/2022)   Hunger Vital Sign    Worried About Running Out of Food in the Last Year: Never true    Ran Out of Food in the Last Year: Never true  Transportation Needs: No Transportation Needs (02/21/2022)   PRAPARE - Transportation  Lack of Transportation (Medical): No    Lack of Transportation (Non-Medical): No  Physical Activity: Not on file  Stress: Not on file  Social Connections: Not on file  Intimate Partner Violence: Not At Risk (02/21/2022)   Humiliation, Afraid, Rape, and Kick questionnaire    Fear of Current or Ex-Partner: No    Emotionally Abused: No    Physically Abused: No    Sexually Abused: No    Family History  Problem Relation Age of Onset   Colon cancer Mother    Hepatitis Son    Prostate cancer Neg Hx    Kidney cancer Neg Hx      Vitals:   02/25/22 1517 02/25/22 2026 02/26/22 0430 02/26/22 0730  BP: 129/65 (!) 140/88 125/87 136/77  Pulse: 72 (!) 59 (!) 101 76  Resp: '19 18 20 18  '$ Temp: 98.7 F (37.1 C) 98.7 F (37.1 C)  98.3 F (36.8 C)  TempSrc:    Oral  SpO2: 92% 92% 92% 92%  Weight:      Height:        PHYSICAL EXAM General: Elderly, acute on chronically ill appearing Caucasian male, in no acute distress.  Sitting upright in recliner HEENT:  Normocephalic and atraumatic. Neck:  No JVD.  Lungs: Normal respiratory effort on 4 L by nasal cannula.  Decreased breath sounds throughout without appreciable crackles or wheezes Heart: Regular rate and rhythm and rhythm normal S1 and S2 without gallops or murmurs.  Abdomen: Non-distended appearing.  Msk: Normal strength and tone for age. Extremities:  Warm and well perfused. No clubbing, cyanosis.  No peripheral edema.  Neuro: Alert and oriented X 3. Psych:  Answers questions appropriately.   Labs: Basic Metabolic Panel: Recent Labs    02/24/22 0549 02/25/22 0437  NA 143 142  K 3.7 3.8  CL 105 108  CO2 32 31  GLUCOSE 92 96  BUN 18 16  CREATININE 0.71 0.71  CALCIUM 8.2* 8.1*    Liver Function Tests: No results for input(s): "AST", "ALT", "ALKPHOS", "BILITOT", "PROT", "ALBUMIN" in the last 72 hours.  No results for input(s): "LIPASE", "AMYLASE" in the last 72 hours. CBC: Recent Labs    02/24/22 0549 02/25/22 0437  WBC 7.6 6.4  HGB 10.8* 10.5*  HCT 33.6* 32.6*  MCV 102.1* 101.6*  PLT 133* 142*    Cardiac Enzymes: No results for input(s): "CKTOTAL", "CKMB", "CKMBINDEX", "TROPONINIHS" in the last 72 hours.  BNP: No results for input(s): "BNP" in the last 72 hours. D-Dimer: No results for input(s): "DDIMER" in the last 72 hours. Hemoglobin A1C: No results for input(s): "HGBA1C" in the last 72 hours. Fasting Lipid Panel: No results for input(s): "CHOL", "HDL", "LDLCALC", "TRIG", "CHOLHDL", "LDLDIRECT" in the last 72 hours. Thyroid Function Tests: No results for input(s): "TSH", "T4TOTAL", "T3FREE", "THYROIDAB" in the last 72 hours.  Invalid input(s): "FREET3" Anemia Panel: No results for input(s): "VITAMINB12", "FOLATE", "FERRITIN", "TIBC", "IRON", "RETICCTPCT" in the last 72 hours.   Radiology: [facility] Chest Port 1 View  Result Date: 02/22/2022 CLINICAL DATA:  Hemoptysis EXAM: PORTABLE CHEST 1 VIEW COMPARISON:  Previous studies including the examination of 02/20/2022 FINDINGS: Transverse diameter of heart is slightly increased. Central pulmonary vessels are more prominent. There are patchy infiltrates in right parahilar region and both lower lung fields, more so on the right side. Lateral CP angles are clear. There is no pneumothorax. IMPRESSION: Patchy infiltrates are seen in right mid and both lower lung fields,  more so on the right side suggesting  possible multifocal pneumonia. Possibility of superimposed interstitial pulmonary edema is not excluded. Electronically Signed   By: Elmer Picker M.D.   On: 02/22/2022 16:02   ECHOCARDIOGRAM COMPLETE  Result Date: 02/21/2022    ECHOCARDIOGRAM REPORT   Patient Name:   Patrick Montoya Date of Exam: 02/21/2022 Medical Rec #:  [id number]      Height:       69.0 in Accession #:    [id number]     Weight:       175.0 lb Date of Birth:  1940-07-15     BSA:          1.952 m Patient Age:    11 years       BP:           121/72 mmHg Patient Gender: M              HR:           69 bpm. Exam Location:  [facility] Procedure: 2D Echo, Color Doppler and Cardiac Doppler Indications:     Atrial Fibrillation I48.91  History:         Patient has prior history of Echocardiogram examinations, most                  recent 03/10/2021. COPD; Risk Factors:Hypertension.  Sonographer:     Sherrie Sport Referring Phys:  [id number] [place] Adelie Croswell Diagnosing Phys: Yolonda Kida MD  Sonographer Comments: No parasternal window. IMPRESSIONS  1. Left ventricular ejection fraction, by estimation, is 50 to 55%. The left ventricle has low normal function. The left ventricle has no regional wall motion abnormalities. There is mild concentric left ventricular hypertrophy. Left ventricular diastolic parameters are consistent with Grade I diastolic dysfunction (impaired relaxation).  2. Right ventricular systolic function is normal. The right ventricular size is normal.  3. The mitral valve is normal in structure. Trivial mitral valve regurgitation.  4. The aortic valve is normal in structure. Aortic valve regurgitation is not visualized. FINDINGS  Left Ventricle: Left ventricular ejection fraction, by estimation, is 50 to 55%. The left ventricle has low normal function. The left ventricle has no regional wall motion abnormalities. The left ventricular internal cavity size was normal in size. There is mild  concentric left ventricular hypertrophy. Left ventricular diastolic parameters are consistent with Grade I diastolic dysfunction (impaired relaxation). Right Ventricle: The right ventricular size is normal. No increase in right ventricular wall thickness. Right ventricular systolic function is normal. Left Atrium: Left atrial size was normal in size. Right Atrium: Right atrial size was normal in size. Pericardium: There is no evidence of pericardial effusion. Mitral Valve: The mitral valve is normal in structure. Trivial mitral valve regurgitation. Tricuspid Valve: The tricuspid valve is normal in structure. Tricuspid valve regurgitation is mild. Aortic Valve: The aortic valve is normal in structure. Aortic valve regurgitation is not visualized. Aortic valve mean gradient measures 8.0 mmHg. Aortic valve peak gradient measures 14.7 mmHg. Aortic valve area, by VTI measures 2.15 cm. Pulmonic Valve: The pulmonic valve was normal in structure. Pulmonic valve regurgitation is not visualized. Aorta: The ascending aorta was not well visualized. IAS/Shunts: No atrial level shunt detected by color flow Doppler.  LEFT VENTRICLE PLAX 2D LVIDd:         3.70 cm     Diastology LVIDs:         2.90 cm     LV e' medial:    6.64 cm/s LV PW:  1.40 cm     LV E/e' medial:  13.6 LV IVS:        1.30 cm     LV e' lateral:   9.57 cm/s LVOT diam:     2.10 cm     LV E/e' lateral: 9.4 LV SV:         76 LV SV Index:   39 LVOT Area:     3.46 cm  LV Volumes (MOD) LV vol d, MOD A4C: 83.2 ml LV vol s, MOD A4C: 40.9 ml LV SV MOD A4C:     83.2 ml RIGHT VENTRICLE RV Basal diam:  4.90 cm RV Mid diam:    4.00 cm RV S prime:     14.50 cm/s TAPSE (M-mode): 2.3 cm LEFT ATRIUM             Index        RIGHT ATRIUM           Index LA diam:        3.20 cm 1.64 cm/m   RA Area:     25.70 cm LA Vol (A2C):   66.3 ml 33.96 ml/m  RA Volume:   93.90 ml  48.10 ml/m LA Vol (A4C):   51.5 ml 26.38 ml/m LA Biplane Vol: 65.0 ml 33.30 ml/m  AORTIC VALVE AV  Area (Vmax):    1.91 cm AV Area (Vmean):   1.84 cm AV Area (VTI):     2.15 cm AV Vmax:           192.00 cm/s AV Vmean:          132.000 cm/s AV VTI:            0.353 m AV Peak Grad:      14.7 mmHg AV Mean Grad:      8.0 mmHg LVOT Vmax:         106.00 cm/s LVOT Vmean:        70.300 cm/s LVOT VTI:          0.219 m LVOT/AV VTI ratio: 0.62  AORTA Ao Root diam: 3.00 cm MITRAL VALVE                TRICUSPID VALVE MV Area (PHT): 3.54 cm     TR Peak grad:   20.8 mmHg MV Decel Time: 214 msec     TR Vmax:        228.00 cm/s MV E velocity: 90.10 cm/s MV A velocity: 113.00 cm/s  SHUNTS MV E/A ratio:  0.80         Systemic VTI:  0.22 m                             Systemic Diam: 2.10 cm Yolonda Kida MD Electronically signed by Yolonda Kida MD Signature Date/Time: 02/21/2022/1:11:59 PM    Final    CT Angio Chest PE W and/or Wo Contrast  Result Date: 02/20/2022 CLINICAL DATA:  Shortness of breath since last night. Pulmonary embolism suspected, high probability. History of chronic obstructive pulmonary disease on home oxygen. EXAM: CT ANGIOGRAPHY CHEST WITH CONTRAST TECHNIQUE: Multidetector CT imaging of the chest was performed using the standard protocol during bolus administration of intravenous contrast. Multiplanar CT image reconstructions and MIPs were obtained to evaluate the vascular anatomy. RADIATION DOSE REDUCTION: This exam was performed according to the departmental dose-optimization program which includes automated exposure control, adjustment of the mA and/or kV according to patient  size and/or use of iterative reconstruction technique. CONTRAST:  80m OMNIPAQUE IOHEXOL 350 MG/ML SOLN COMPARISON:  Chest CTA 08/01/2021.  Chest radiographs 02/20/2022. FINDINGS: Cardiovascular: The pulmonary arteries are well opacified with contrast to the level of the subsegmental branches. There is no evidence of acute pulmonary embolism. Relatively mild atherosclerosis of the aorta, great vessels and coronary  arteries. There is limited systemic arterial opacification without evidence of acute vascular abnormality. Possible aortic valvular calcifications. The heart size is normal. A small amount of pericardial fluid appears unchanged. Mediastinum/Nodes: There are no enlarged mediastinal, hilar or axillary lymph nodes.Small mediastinal lymph nodes are unchanged, likely reactive. The thyroid gland, trachea and esophagus demonstrate no significant findings. Lungs/Pleura: Moderate centrilobular and paraseptal emphysema. There are superimposed patchy airspace opacities throughout the right lung consistent with multilobar pneumonia. There is minimal dependent opacity in the left lower lobe. Unchanged small perifissural nodule along the minor fissure on image 89/5. No suspicious pulmonary nodules. Upper abdomen: The visualized upper abdomen appears stable status post cholecystectomy. No acute findings. Musculoskeletal/Chest wall: There is no chest wall mass or suspicious osseous finding. Review of the MIP images confirms the above findings. IMPRESSION: 1. No evidence of acute pulmonary embolism or other acute vascular findings in the chest. 2. Patchy airspace opacities throughout the right lung consistent with multilobar pneumonia. The airspace opacities are predominantly dependent in location and could be secondary to aspiration. Radiographic follow-up recommended to ensure resolution. 3. Aortic Atherosclerosis (ICD10-I70.0) and Emphysema (ICD10-J43.9). Electronically Signed   By: WRichardean [name].D.   On: 02/20/2022 12:20   DG Chest Port 1 View  Result Date: 02/20/2022 CLINICAL DATA:  Questionable sepsis - evaluate for abnormality EXAM: PORTABLE CHEST 1 VIEW COMPARISON:  Radiograph 08/01/2021 FINDINGS: Unchanged cardiomediastinal silhouette. Mild diffuse interstitial opacities. There is airspace consolidation in the right lower lung. Trace right pleural effusion. No evidence of pneumothorax. No acute osseous abnormality.  Thoracic spondylosis. IMPRESSION: Right lower lung pneumonia. Diffuse interstitial opacities could reflect a degree of interstitial edema. Electronically Signed   By: JMaurine SimmeringM.D.   On: 02/20/2022 10:18    Lexiscan myoview 06/27/2021 Impression  1. No significant ischemia noted on current study.  2. There is a small size, moderate in intensity defect of the basal septal  segment which is predominantly fixed.  3. Normal LV systolic function, but with TID ratio of 1.23.  Narrative  Table formatting from the original result was not included.  [facility]  [address] [place]  [id number] [phone]  Procedure: Pharmacologic Myocardial Perfusion Imaging    ONE day procedure   Indication: Paroxysmal A-fib (CMS-HCC)  Plan: NM myocardial perfusion SPECT multiple (stress        and rest), ECG stress test only   Shortness of breath  Plan: NM myocardial perfusion SPECT multiple (stress        and rest), ECG stress test only   Ordering Physician:   Dr. RDonnelly Angelica   Clinical History:  81y.o. year old male  Vitals: Height: 69 in Weight: 170 lb  Cardiac risk factors include:     AFIB and HTN    Procedure:   Pharmacologic stress testing was performed with Regadenoson using a single  use 0.'4mg'$ /549m(0.08 mg/ml) prefilled syringe intravenously infused as a  bolus dose over 10-15 seconds. The stress test was stopped due to Infusion  completion.  Blood pressure response was normal. The patient did not  develop any symptoms other than fatigue during the procedure.   Rest HR: 71bpm  Rest BP: 150/42mHg  Max HR: 88bpm  Min BP: 132/741mg   Stress Test Administered by: DIOswald HillockCMA   ECG Interpretation:  Rest ECG:  normal sinus rhythm, none  Stress ECG:  normal sinus rhythm, none  Recovery ECG:  normal sinus rhythm  ECG Interpretation:  negative.    Administrations This Visit     regadenoson  (LEXISCAN) 0.4 mg/5 mL inj syringe 0.4 mg     Admin Date  06/28/2021 Action  Given Dose  0.4 mg Route  Intravenous Administered By  GoHerbert SetaCNMT     technetium Tc9927mstamibi (CARDIOLITE) injection 10.00.93llicurie     Admin Date  06/28/2021 Action  Given Dose  10.81.82llicurie Route  Intravenous Administered By  GoaHerbert SetaNMT     technetium Tc99m60mtamibi (CARDIOLITE) injection 31.099.37licurie     Admin Date  06/28/2021 Action  Given Dose  31.016.96licurie Route  Intravenous Administered By  MaytCherlynn PerchesMT   Gated post-stress perfusion imaging was performed 30 minutes after stress.  Rest images were performed 30 minutes after injection.   Gated LV Analysis:   Summary of LV Perfusion: Normal,   Summary of LV Function: Normal    TID Ratio: 1.24   LVEF= 58%   FINDINGS:  Regional wall motion:  reveals normal myocardial thickening and wall  motion.  The overall quality of the study is fair.    Artifacts noted: yes  Left ventricular cavity: normal.   ECHO 03/10/2021 IMPRESSIONS     1. Left ventricular ejection fraction, by estimation, is 55 to 60%. The  left ventricle has normal function. The left ventricle has no regional  wall motion abnormalities. Left ventricular diastolic parameters were  normal.   2. Right ventricular systolic function is normal. The right ventricular  size is normal.   3. The mitral valve is normal in structure. Mild mitral valve  regurgitation.   4. The aortic valve is normal in structure. Aortic valve regurgitation is  trivial.   FINDINGS   Left Ventricle: Left ventricular ejection fraction, by estimation, is 55  to 60%. The left ventricle has normal function. The left ventricle has no  regional wall motion abnormalities. The left ventricular internal cavity  size was normal in size. There is   no left ventricular hypertrophy. Left ventricular diastolic parameters  were normal.   Right Ventricle: The  right ventricular size is normal. No increase in  right ventricular wall thickness. Right ventricular systolic function is  normal.   Left Atrium: Left atrial size was normal in size.   Right Atrium: Right atrial size was normal in size.   Pericardium: There is no evidence of pericardial effusion.   Mitral Valve: The mitral valve is normal in structure. Mild mitral valve  regurgitation.   Tricuspid Valve: The tricuspid valve is normal in structure. Tricuspid  valve regurgitation is trivial.   Aortic Valve: The aortic valve is normal in structure. Aortic valve  regurgitation is trivial. Aortic valve peak gradient measures 14.1 mmHg.   Pulmonic Valve: The pulmonic valve was normal in structure. Pulmonic valve  regurgitation is not visualized.   Aorta: The aortic root and ascending aorta are structurally normal, with  no evidence of dilitation.   IAS/Shunts: No atrial level shunt detected by color flow Doppler.      LEFT VENTRICLE  PLAX 2D  LVIDd:  4.10 cm   Diastology  LVIDs:         3.00 cm   LV e' medial:    5.11 cm/s  LV PW:         0.90 cm   LV E/e' medial:  16.3  LV IVS:        1.20 cm   LV e' lateral:   7.51 cm/s  LVOT diam:     1.70 cm   LV E/e' lateral: 11.1  LVOT Area:     2.27 cm      RIGHT VENTRICLE  RV Basal diam:  3.70 cm  RV Mid diam:    3.10 cm  RV S prime:     10.90 cm/s  TAPSE (M-mode): 1.9 cm   LEFT ATRIUM             Index        RIGHT ATRIUM           Index  LA diam:        3.90 cm 2.04 cm/m   RA Area:     16.70 cm  LA Vol (A2C):   50.6 ml 26.53 ml/m  RA Volume:   47.60 ml  24.96 ml/m  LA Vol (A4C):   65.7 ml 34.45 ml/m  LA Biplane Vol: 59.4 ml 31.14 ml/m   AORTIC VALVE                 PULMONIC VALVE  AV Area (Vmax): 1.39 cm     PV Vmax:        1.14 m/s  AV Vmax:        188.00 cm/s  PV Peak grad:   5.2 mmHg  AV Peak Grad:   14.1 mmHg    RVOT Peak grad: 2 mmHg  LVOT Vmax:      115.00 cm/s     AORTA  Ao Root diam: 2.80 cm    MITRAL VALVE                TRICUSPID VALVE  MV Area (PHT): 3.06 cm     TR Peak grad:   28.9 mmHg  MV Decel Time: 248 msec     TR Vmax:        269.00 cm/s  MV E velocity: 83.40 cm/s  MV A velocity: 102.00 cm/s  SHUNTS  MV E/A ratio:  0.82         Systemic Diam: 1.70 cm  MV A Prime:    10.9 cm/s   Serafina Royals MD  Electronically signed by Serafina Royals MD  Signature Date/Time: 03/12/2021/8:49:16 AM   TELEMETRY reviewed by me (LT) 02/26/2022 : Sinus rhythm rate 70s to 80s, period of A-fib with RVR in the 120s the morning of 10/17, return to sinus rhythm late morning and 70s to 80s  EKG reviewed by me: Atrial fibrillation with RVR rate 167  Data reviewed by me (LT) 02/26/2022: Hospitalist progress note, nursing notes, CBC, BMP, vitals, telemetry  Principal Problem:   Sepsis ([place]) Active Problems:   COPD (chronic obstructive pulmonary disease) ([place])   Parkinson's disease   Chronic respiratory failure with hypoxia ([place])   Long term (current) use of anticoagulants   Rapid atrial fibrillation (HCC)   Acute on chronic respiratory failure with hypoxia (HCC)   Multifocal pneumonia   HTN (hypertension)   Hypokalemia   Atrial fibrillation (HCC)    ASSESSMENT AND PLAN:  Patrick Montoya is an 17yoM with a past medical history of paroxysmal  atrial fibrillation on Coumadin, essential hypertension, COPD on 3 L and under hospice care, Parkinson's disease, BPH who presented to [facility] ED 02/20/2022 with worsening shortness of breath, fever of 103 degrees at home and a productive cough.  He was found to be in atrial fibrillation with RVR on admission with heart rates peaking in the 170s, for which cardiology is consulted for further assistance.  #Sepsis secondary to multifocal pneumonia #Chronic respiratory failure/COPD on baseline 3 L -Agree with current therapy per primary team, has been given 2 L of LR, started on broad-spectrum antibiotics -Cleared by speech therapy for  diet.  #Pseudomonas cystitis Trace leuks on UA, Confirmed on urine culture, on IV Zosyn.  #Paroxysmal atrial fibrillation with RVR Has had several episodes of this over the past year, usually always in the setting of fever or pneumonia.  Typically responsive to IV calcium channel blockers.  EKG shows atrial fibrillation with rate 168, on telemetry so far he has been consistently 130-140 prior to amiodarone.  He converted to NSR after amiodarone bolus was given and remains in sinus rhythm on 10/12.  Converted back to A-fib with RVR the morning of 10/13 after his oxygen concentrator was disconnected and he was hypoxic. -Continue to treat underlying causes of increased adrenergic tone, including infection, hypoxia, pain -Monitor and replete electrolytes for goal K >4, mag >2 -S/p IV diltiazem 20 mg x 1, started on a diltiazem infusion which resulted in marked hypotension. -S/p amiodarone bolus and continuous infusion x24 hours. -Continue amiodarone p.o. 200 mg twice daily x5 days, then 200 mg daily thereafter. -Consolidate metoprolol tartrate from 12.5 mg 3 times daily to 25 mg twice daily (On 25 mg metoprolol XL once daily at home) -Warfarin o held for a couple days due to hemoptysis, resumed on 10/16-CHA2DS2-VASc 3 (age, HTN) -Echocardiogram complete resulted with preserved EF 50-55%, g1 dd, no RWMAS  #Elevated troponin Initially 66 on first check, repeat downtrending at 37.  Troponin elevation most consistent with demand/supply mismatch in the setting of sepsis and tachycardia and not ACS   This patient's plan of care was discussed and created with Dr. Clayborn Bigness and he is in agreement.  Signed: Tristan Schroeder , PA-C 02/26/2022, 8:56 AM [facility]

## 2022-02-26 NOTE — TOC Transition Note (Signed)
Transition of Care [facility]) - CM/SW Discharge Note   Patient Details  Name: Patrick Montoya MRN: [id number] Date of Birth: 19-Feb-1941  Transition of Care [facility]) CM/SW Contact:  Candie Chroman, LCSW Phone Number: 02/26/2022, 12:18 PM   Clinical Narrative:  Patient has orders to discharge home today. Daughter-in-law unable to pick up patient so set up EMS transport. Confirmed address on facesheet is correct. RN will call wife when they arrive. No further concerns. CSW signing off.   Final next level of care: Home w Hospice Care Barriers to Discharge: Barriers Resolved   Patient Goals and CMS Choice Patient states their goals for this hospitalization and ongoing recovery are:: "To return to my normal state of living." CMS Medicare.gov Compare Post Acute Care list provided to:: Patient Choice offered to / list presented to : Patient  Discharge Placement                Patient to be transferred to facility by: EMS Name of family member notified: Donnie Coffin Patient and family notified of of transfer: 02/26/22  Discharge Plan and Services                                     Social Determinants of Health (SDOH) Interventions     Readmission Risk Interventions    02/25/2022   12:00 PM 07/25/2021    9:58 AM  Readmission Risk Prevention Plan  Transportation Screening Complete Complete  PCP or Specialist Appt within 3-5 Days Complete Complete  HRI or [place] Complete Complete  Social Work Consult for [place] Planning/Counseling Complete   Palliative Care Screening Complete Not Applicable  Medication Review [employer]) Complete Complete

## 2022-04-13 ENCOUNTER — Emergency Department (HOSPITAL_COMMUNITY)

## 2022-04-13 ENCOUNTER — Other Ambulatory Visit: Payer: Self-pay

## 2022-04-13 ENCOUNTER — Inpatient Hospital Stay (HOSPITAL_COMMUNITY)
Admission: EM | Admit: 2022-04-13 | Discharge: 2022-04-18 | DRG: 871 | Disposition: A | Discharge status: Hospice | Attending: Family Medicine | Admitting: Family Medicine

## 2022-04-13 DIAGNOSIS — Z8711 Personal history of peptic ulcer disease: Secondary | ICD-10-CM | POA: Diagnosis not present

## 2022-04-13 DIAGNOSIS — K219 Gastro-esophageal reflux disease without esophagitis: Secondary | ICD-10-CM | POA: Diagnosis present

## 2022-04-13 DIAGNOSIS — E876 Hypokalemia: Secondary | ICD-10-CM | POA: Diagnosis present

## 2022-04-13 DIAGNOSIS — Z66 Do not resuscitate: Secondary | ICD-10-CM | POA: Diagnosis present

## 2022-04-13 DIAGNOSIS — E785 Hyperlipidemia, unspecified: Secondary | ICD-10-CM | POA: Diagnosis present

## 2022-04-13 DIAGNOSIS — G20A1 Parkinson's disease without dyskinesia, without mention of fluctuations: Secondary | ICD-10-CM | POA: Diagnosis present

## 2022-04-13 DIAGNOSIS — E870 Hyperosmolality and hypernatremia: Secondary | ICD-10-CM | POA: Diagnosis present

## 2022-04-13 DIAGNOSIS — J9621 Acute and chronic respiratory failure with hypoxia: Secondary | ICD-10-CM | POA: Diagnosis present

## 2022-04-13 DIAGNOSIS — Z7901 Long term (current) use of anticoagulants: Secondary | ICD-10-CM

## 2022-04-13 DIAGNOSIS — J439 Emphysema, unspecified: Secondary | ICD-10-CM | POA: Diagnosis present

## 2022-04-13 DIAGNOSIS — J159 Unspecified bacterial pneumonia: Secondary | ICD-10-CM | POA: Diagnosis present

## 2022-04-13 DIAGNOSIS — Z79899 Other long term (current) drug therapy: Secondary | ICD-10-CM

## 2022-04-13 DIAGNOSIS — R1314 Dysphagia, pharyngoesophageal phase: Secondary | ICD-10-CM | POA: Diagnosis present

## 2022-04-13 DIAGNOSIS — J209 Acute bronchitis, unspecified: Secondary | ICD-10-CM | POA: Diagnosis present

## 2022-04-13 DIAGNOSIS — N400 Enlarged prostate without lower urinary tract symptoms: Secondary | ICD-10-CM | POA: Diagnosis present

## 2022-04-13 DIAGNOSIS — I110 Hypertensive heart disease with heart failure: Secondary | ICD-10-CM | POA: Diagnosis present

## 2022-04-13 DIAGNOSIS — I480 Paroxysmal atrial fibrillation: Secondary | ICD-10-CM | POA: Diagnosis present

## 2022-04-13 DIAGNOSIS — Z1612 Extended spectrum beta lactamase (ESBL) resistance: Secondary | ICD-10-CM | POA: Diagnosis present

## 2022-04-13 DIAGNOSIS — A419 Sepsis, unspecified organism: Secondary | ICD-10-CM | POA: Diagnosis present

## 2022-04-13 DIAGNOSIS — Z800 Family history of malignant neoplasm of digestive organs: Secondary | ICD-10-CM | POA: Diagnosis not present

## 2022-04-13 DIAGNOSIS — J155 Pneumonia due to Escherichia coli: Secondary | ICD-10-CM | POA: Diagnosis present

## 2022-04-13 DIAGNOSIS — E860 Dehydration: Secondary | ICD-10-CM | POA: Diagnosis present

## 2022-04-13 DIAGNOSIS — I5032 Chronic diastolic (congestive) heart failure: Secondary | ICD-10-CM | POA: Diagnosis present

## 2022-04-13 DIAGNOSIS — R6520 Severe sepsis without septic shock: Secondary | ICD-10-CM | POA: Diagnosis present

## 2022-04-13 DIAGNOSIS — Z9079 Acquired absence of other genital organ(s): Secondary | ICD-10-CM

## 2022-04-13 DIAGNOSIS — Z87891 Personal history of nicotine dependence: Secondary | ICD-10-CM

## 2022-04-13 DIAGNOSIS — Z1152 Encounter for screening for COVID-19: Secondary | ICD-10-CM | POA: Diagnosis not present

## 2022-04-13 DIAGNOSIS — J189 Pneumonia, unspecified organism: Secondary | ICD-10-CM | POA: Diagnosis present

## 2022-04-13 DIAGNOSIS — Z23 Encounter for immunization: Secondary | ICD-10-CM | POA: Diagnosis not present

## 2022-04-13 DIAGNOSIS — J440 Chronic obstructive pulmonary disease with acute lower respiratory infection: Secondary | ICD-10-CM | POA: Diagnosis not present

## 2022-04-13 DIAGNOSIS — Z7951 Long term (current) use of inhaled steroids: Secondary | ICD-10-CM

## 2022-04-13 DIAGNOSIS — Z9049 Acquired absence of other specified parts of digestive tract: Secondary | ICD-10-CM

## 2022-04-13 DIAGNOSIS — Z85828 Personal history of other malignant neoplasm of skin: Secondary | ICD-10-CM

## 2022-04-13 DIAGNOSIS — Z8619 Personal history of other infectious and parasitic diseases: Secondary | ICD-10-CM

## 2022-04-13 LAB — BASIC METABOLIC PANEL
Anion gap: 11 (ref 5–15)
BUN: 17 mg/dL (ref 8–23)
CO2: 23 mmol/L (ref 22–32)
Calcium: 7.1 mg/dL — ABNORMAL LOW (ref 8.9–10.3)
Chloride: 113 mmol/L — ABNORMAL HIGH (ref 98–111)
Creatinine, Ser: 0.85 mg/dL (ref 0.61–1.24)
GFR, Estimated: >60 mL/min (ref >60)
Glucose, Bld: 126 mg/dL — ABNORMAL HIGH (ref 70–99)
Potassium: 3.2 mmol/L — ABNORMAL LOW (ref 3.5–5.1)
Sodium: 147 mmol/L — ABNORMAL HIGH (ref 135–145)

## 2022-04-13 LAB — I-STAT ARTERIAL BLOOD GAS, ED
Acid-Base Excess: 2 mmol/L (ref 0.0–2.0)
Bicarbonate: 27.5 mmol/L (ref 20.0–28.0)
Calcium, Ion: 1.24 mmol/L (ref 1.15–1.40)
HCT: 33 % — ABNORMAL LOW (ref 39.0–52.0)
Hemoglobin: 11.2 g/dL — ABNORMAL LOW (ref 13.0–17.0)
O2 Saturation: 98 %
Patient temperature: 98.6
Potassium: 3.4 mmol/L — ABNORMAL LOW (ref 3.5–5.1)
Sodium: 141 mmol/L (ref 135–145)
TCO2: 29 mmol/L (ref 22–32)
pCO2 arterial: 46.2 mmHg (ref 32–48)
pH, Arterial: 7.382 (ref 7.35–7.45)
pO2, Arterial: 115 mmHg — ABNORMAL HIGH (ref 83–108)

## 2022-04-13 LAB — URINALYSIS, ROUTINE W REFLEX MICROSCOPIC
Bilirubin Urine: NEGATIVE
Glucose, UA: NEGATIVE mg/dL
Hgb urine dipstick: NEGATIVE
Ketones, ur: 5 mg/dL — AB
Nitrite: NEGATIVE
Protein, ur: 30 mg/dL — AB
Specific Gravity, Urine: 1.025 (ref 1.005–1.030)
pH: 5 (ref 5.0–8.0)

## 2022-04-13 LAB — LACTIC ACID, PLASMA
Lactic Acid, Venous: 2.7 mmol/L (ref 0.5–1.9)
Lactic Acid, Venous: 1.8 mmol/L (ref 0.5–1.9)

## 2022-04-13 LAB — CBC WITH DIFFERENTIAL/PLATELET
Abs Immature Granulocytes: 0.07 10*3/uL (ref 0.00–0.07)
Basophils Absolute: 0 10*3/uL (ref 0.0–0.1)
Basophils Relative: 0 %
Eosinophils Absolute: 0.4 10*3/uL (ref 0.0–0.5)
Eosinophils Relative: 3 %
HCT: 38.2 % — ABNORMAL LOW (ref 39.0–52.0)
Hemoglobin: 12.5 g/dL — ABNORMAL LOW (ref 13.0–17.0)
Immature Granulocytes: 1 %
Lymphocytes Relative: 18 %
Lymphs Abs: 2.3 10*3/uL (ref 0.7–4.0)
MCH: 33.9 pg (ref 26.0–34.0)
MCHC: 32.7 g/dL (ref 30.0–36.0)
MCV: 103.5 fL — ABNORMAL HIGH (ref 80.0–100.0)
Monocytes Absolute: 0.7 10*3/uL (ref 0.1–1.0)
Monocytes Relative: 5 %
Neutro Abs: 9.1 10*3/uL — ABNORMAL HIGH (ref 1.7–7.7)
Neutrophils Relative %: 73 %
Platelets: 262 10*3/uL (ref 150–400)
RBC: 3.69 MIL/uL — ABNORMAL LOW (ref 4.22–5.81)
RDW: 14.9 % (ref 11.5–15.5)
WBC: 12.6 10*3/uL — ABNORMAL HIGH (ref 4.0–10.5)
nRBC: 0 % (ref 0.0–0.2)

## 2022-04-13 LAB — PROTIME-INR
INR: 2.1 — ABNORMAL HIGH (ref 0.8–1.2)
Prothrombin Time: 23.1 seconds — ABNORMAL HIGH (ref 11.4–15.2)

## 2022-04-13 LAB — BRAIN NATRIURETIC PEPTIDE: B Natriuretic Peptide: 94.4 pg/mL (ref 0.0–100.0)

## 2022-04-13 LAB — RESP PANEL BY RT-PCR (FLU A&B, COVID) ARPGX2
Influenza A by PCR: NEGATIVE
Influenza B by PCR: NEGATIVE
SARS Coronavirus 2 by RT PCR: NEGATIVE

## 2022-04-13 LAB — APTT: aPTT: 40 seconds — ABNORMAL HIGH (ref 24–36)

## 2022-04-13 MED ORDER — METHYLPREDNISOLONE SODIUM SUCC 125 MG IJ SOLR
125.0000 mg | Freq: Once | INTRAMUSCULAR | Status: AC
  Administered 2022-04-13: 125 mg via INTRAVENOUS

## 2022-04-13 MED ORDER — LACTATED RINGERS IV SOLN: INTRAVENOUS | Status: AC

## 2022-04-13 MED ORDER — PREDNISONE 20 MG PO TABS
40.0000 mg | ORAL_TABLET | Freq: Every day | ORAL | Status: DC
  Administered 2022-04-14 – 2022-04-18 (×5): 40 mg via ORAL
  Filled 2022-04-13 (×6): qty 2

## 2022-04-13 MED ORDER — PANTOPRAZOLE SODIUM 40 MG PO TBEC
40.0000 mg | DELAYED_RELEASE_TABLET | Freq: Two times a day (BID) | ORAL | Status: DC
  Administered 2022-04-13 – 2022-04-18 (×10): 40 mg via ORAL
  Filled 2022-04-13 (×10): qty 1

## 2022-04-13 MED ORDER — SODIUM CHLORIDE 0.9 % IV SOLN: 500.0000 mg | INTRAVENOUS | Status: DC

## 2022-04-13 MED ORDER — IPRATROPIUM-ALBUTEROL 0.5-2.5 (3) MG/3ML IN SOLN: 3.0000 mL | RESPIRATORY_TRACT | Status: DC | PRN

## 2022-04-13 MED ORDER — METOPROLOL TARTRATE 25 MG PO TABS
25.0000 mg | ORAL_TABLET | Freq: Two times a day (BID) | ORAL | Status: DC
  Administered 2022-04-13 – 2022-04-18 (×11): 25 mg via ORAL
  Filled 2022-04-13 (×11): qty 1

## 2022-04-13 MED ORDER — MOMETASONE FURO-FORMOTEROL FUM 200-5 MCG/ACT IN AERO
2.0000 | INHALATION_SPRAY | Freq: Two times a day (BID) | RESPIRATORY_TRACT | Status: DC
  Administered 2022-04-13 – 2022-04-15 (×4): 2 via RESPIRATORY_TRACT
  Filled 2022-04-13: qty 8.8

## 2022-04-13 MED ORDER — SODIUM CHLORIDE 0.9 % IV SOLN: 2.0000 g | INTRAVENOUS | Status: DC

## 2022-04-13 MED ORDER — DOXYCYCLINE HYCLATE 100 MG PO TABS
100.0000 mg | ORAL_TABLET | Freq: Two times a day (BID) | ORAL | Status: AC
  Administered 2022-04-14 – 2022-04-17 (×8): 100 mg via ORAL
  Filled 2022-04-13 (×8): qty 1

## 2022-04-13 MED ORDER — GUAIFENESIN ER 600 MG PO TB12
1200.0000 mg | ORAL_TABLET | Freq: Two times a day (BID) | ORAL | Status: DC
  Administered 2022-04-13 – 2022-04-18 (×11): 1200 mg via ORAL
  Filled 2022-04-13 (×11): qty 2

## 2022-04-13 MED ORDER — ALBUTEROL SULFATE (2.5 MG/3ML) 0.083% IN NEBU: 2.5000 mg | INHALATION_SOLUTION | Freq: Four times a day (QID) | RESPIRATORY_TRACT | Status: DC | PRN

## 2022-04-13 MED ORDER — SODIUM CHLORIDE 0.9 % IV SOLN
500.0000 mg | INTRAVENOUS | Status: DC
  Administered 2022-04-13: 500 mg via INTRAVENOUS
  Filled 2022-04-13: qty 5

## 2022-04-13 MED ORDER — ALPRAZOLAM 0.5 MG PO TABS
0.5000 mg | ORAL_TABLET | Freq: Every evening | ORAL | Status: DC | PRN
  Administered 2022-04-13 – 2022-04-17 (×5): 0.5 mg via ORAL
  Filled 2022-04-13 (×2): qty 1
  Filled 2022-04-13: qty 2
  Filled 2022-04-13 (×2): qty 1

## 2022-04-13 MED ORDER — SODIUM CHLORIDE 0.9 % IV SOLN
2.0000 g | INTRAVENOUS | Status: DC
  Administered 2022-04-13 – 2022-04-15 (×3): 2 g via INTRAVENOUS
  Filled 2022-04-13 (×3): qty 20

## 2022-04-13 MED ORDER — CARBIDOPA-LEVODOPA 25-100 MG PO TABS
2.0000 | ORAL_TABLET | Freq: Every day | ORAL | Status: DC
  Administered 2022-04-13 – 2022-04-18 (×21): 2 via ORAL
  Filled 2022-04-13 (×21): qty 2

## 2022-04-13 MED ORDER — FERROUS GLUCONATE 324 (38 FE) MG PO TABS
324.0000 mg | ORAL_TABLET | Freq: Every day | ORAL | Status: DC
  Administered 2022-04-14 – 2022-04-17 (×4): 324 mg via ORAL
  Filled 2022-04-13 (×6): qty 1

## 2022-04-13 MED ORDER — SODIUM CHLORIDE 0.9 % IV SOLN: INTRAVENOUS | Status: DC

## 2022-04-13 MED ORDER — QUETIAPINE FUMARATE 25 MG PO TABS
50.0000 mg | ORAL_TABLET | Freq: Every day | ORAL | Status: DC
  Administered 2022-04-13 – 2022-04-17 (×5): 50 mg via ORAL
  Filled 2022-04-13 (×5): qty 2

## 2022-04-13 MED ORDER — WARFARIN - PHARMACIST DOSING INPATIENT: Freq: Every day | Status: DC

## 2022-04-13 MED ORDER — POTASSIUM CHLORIDE CRYS ER 20 MEQ PO TBCR
40.0000 meq | EXTENDED_RELEASE_TABLET | Freq: Once | ORAL | Status: AC
  Administered 2022-04-13: 40 meq via ORAL
  Filled 2022-04-13: qty 2

## 2022-04-13 MED ORDER — MONTELUKAST SODIUM 10 MG PO TABS
10.0000 mg | ORAL_TABLET | Freq: Every day | ORAL | Status: DC
  Administered 2022-04-13 – 2022-04-17 (×5): 10 mg via ORAL
  Filled 2022-04-13 (×5): qty 1

## 2022-04-13 MED ORDER — ACETAMINOPHEN 325 MG PO TABS: 650.0000 mg | ORAL_TABLET | Freq: Once | ORAL | Status: DC

## 2022-04-13 MED ORDER — ACETAMINOPHEN 650 MG RE SUPP
650.0000 mg | Freq: Once | RECTAL | Status: AC
  Administered 2022-04-13: 650 mg via RECTAL
  Filled 2022-04-13: qty 1

## 2022-04-13 MED ORDER — AMIODARONE HCL 200 MG PO TABS
100.0000 mg | ORAL_TABLET | Freq: Every day | ORAL | Status: DC
  Administered 2022-04-13 – 2022-04-18 (×6): 100 mg via ORAL
  Filled 2022-04-13 (×6): qty 1

## 2022-04-13 MED ORDER — FERROUS GLUCONATE 324 (38 FE) MG PO TABS: 324.0000 mg | ORAL_TABLET | Freq: Every day | ORAL | Status: DC

## 2022-04-13 MED ORDER — SUMATRIPTAN SUCCINATE 25 MG PO TABS
25.0000 mg | ORAL_TABLET | ORAL | Status: DC | PRN
  Administered 2022-04-14 – 2022-04-16 (×3): 25 mg via ORAL
  Filled 2022-04-13 (×4): qty 1

## 2022-04-13 MED ORDER — PANTOPRAZOLE SODIUM 40 MG PO TBEC: 40.0000 mg | DELAYED_RELEASE_TABLET | Freq: Every day | ORAL | Status: DC

## 2022-04-13 MED ORDER — IPRATROPIUM-ALBUTEROL 0.5-2.5 (3) MG/3ML IN SOLN
3.0000 mL | RESPIRATORY_TRACT | Status: DC
  Administered 2022-04-13 – 2022-04-14 (×5): 3 mL via RESPIRATORY_TRACT
  Filled 2022-04-13 (×5): qty 3

## 2022-04-13 MED ORDER — MAGNESIUM SULFATE 2 GM/50ML IV SOLN
2.0000 g | Freq: Once | INTRAVENOUS | Status: AC
  Administered 2022-04-13: 2 g via INTRAVENOUS
  Filled 2022-04-13: qty 50

## 2022-04-13 MED ORDER — WARFARIN SODIUM 2.5 MG PO TABS
2.5000 mg | ORAL_TABLET | Freq: Once | ORAL | Status: AC
  Administered 2022-04-13: 2.5 mg via ORAL
  Filled 2022-04-13: qty 1

## 2022-04-13 NOTE — ED Notes (Signed)
Called nurse to s/w patient's son--she advised she was busy--please call patient's son back Patrick Montoya [facility] @ [phone] asap

## 2022-04-13 NOTE — H&P (Signed)
History and Physical    Patrick Montoya:[id number] DOB: 11-20-1940 DOA: 04/13/2022  PCP: Tracie Harrier, MD (Confirm with patient/family/NH records and if not entered, this has to be entered at [facility] point of entry) Patient coming from: Home  I have personally briefly reviewed patient's old medical records in [place]  Chief Complaint: Cough, wheezing, shortness of breath.  HPI: Patrick Montoya is a 81 y.o. male with medical history significant of recurrent multifocal pneumonia in last 1 to 2 years, esophageal dysphagia on mechanical soft/dysphagia 3 diet, COPD Gold stage III, chronic HFpEF, chronic hypoxic respiratory failure on 2 L continuously, peptic ulcer on PPI, HTN, PAF on Coumadin, presented with worsening of productive cough wheezing and shortness of breath.  Symptoms started about 2 weeks ago, initially dry cough and then started to turn productive with thick yellowish sputum no fever chills no chest pains.  Accompanied with intermittent wheezing and exertional dyspnea.  Went to see [facility] pulmonology 3 days ago, repeat x-ray showed persistent multifocal infiltrates implying multifocal pneumonia, and [facility] started patient on doxycycline twice daily, but patient has yet to start doxycycline at home.  Denies any fever or chills at home.  ED Course: Patient was found febrile with temperature 102.2, borderline tachycardia, profound hypoxia and tachypnea, requiring BiPAP briefly in the ED did not titrate down to 6 L.  Chest x-ray showed again multifocal pneumonia right> left.  WBC 12.6, K3.2, and patient was started on ceftriaxone and azithromycin for empirical pneumonia coverage.  Review of Systems: As per HPI otherwise 14 point review of systems negative.    Past Medical History:  Diagnosis Date   Basal cell carcinoma 07/25/2015   L zygoma inf to lat canthus   Basal cell carcinoma 06/11/2018   L nasal ala   Basal cell carcinoma 10/04/2019   Left nose.  Infiltrative pattern.   BPH (benign prostatic hyperplasia)    COPD (chronic obstructive pulmonary disease) (HCC)    Elevated liver enzymes    Emphysema of lung (HCC)    Gastric ulcer    GERD (gastroesophageal reflux disease)    Hepatitis B    Hypertension    Parkinson's disease    Prostate hypertrophy    Rotator cuff tear right   UTI (urinary tract infection)     Past Surgical History:  Procedure Laterality Date   CHOLECYSTECTOMY     COLONOSCOPY WITH PROPOFOL N/A 10/20/2015   Procedure: COLONOSCOPY WITH PROPOFOL;  Surgeon: Manya Silvas, MD;  Location: [place];  Service: Endoscopy;  Laterality: N/A;   COLONOSCOPY WITH PROPOFOL N/A 01/24/2021   Procedure: COLONOSCOPY WITH PROPOFOL;  Surgeon: Toledo, Benay Pike, MD;  Location: [facility] ENDOSCOPY;  Service: Gastroenterology;  Laterality: N/A;   ESOPHAGOGASTRODUODENOSCOPY (EGD) WITH PROPOFOL N/A 10/20/2015   Procedure: ESOPHAGOGASTRODUODENOSCOPY (EGD) WITH PROPOFOL;  Surgeon: Manya Silvas, MD;  Location: [facility] ENDOSCOPY;  Service: Endoscopy;  Laterality: N/A;   GREEN LIGHT LASER TURP (TRANSURETHRAL RESECTION OF PROSTATE N/A 11/17/2018   Procedure: GREEN LIGHT LASER TURP (TRANSURETHRAL RESECTION OF PROSTATE;  Surgeon: Royston Cowper, MD;  Location: [facility] ORS;  Service: Urology;  Laterality: N/A;   INGUINAL HERNIA REPAIR Right 02/18/2019   Procedure: HERNIA REPAIR INGUINAL ADULT;  Surgeon: Royston Cowper, MD;  Location: [facility] ORS;  Service: Urology;  Laterality: Right;   INSERTION OF MESH Right 02/18/2019   Procedure: INSERTION OF MESH;  Surgeon: Royston Cowper, MD;  Location: [facility] ORS;  Service: Urology;  Laterality: Right;   TONSILLECTOMY  TRANSURETHRAL MICROWAVE THERAPY       reports that he has quit smoking. His smoking use included cigarettes. He has a 62.00 pack-year smoking history. He has never used smokeless tobacco. He reports current alcohol use. He reports that he does not use drugs.  No Known Allergies  Family  History  Problem Relation Age of Onset   Colon cancer Mother    Hepatitis Son    Prostate cancer Neg Hx    Kidney cancer Neg Hx      Prior to Admission medications   Medication Sig Start Date End Date Taking? Authorizing Provider  albuterol (VENTOLIN HFA) 108 (90 Base) MCG/ACT inhaler Inhale 2 puffs into the lungs every 6 (six) hours as needed for wheezing or shortness of breath. 02/26/22   Emeterio Reeve, DO  ALPRAZolam Duanne Moron) 0.5 MG tablet Take 0.5 mg by mouth at bedtime as needed for anxiety or sleep.    [provider]  amiodarone (PACERONE) 100 MG tablet Take 2 tablets (200 mg total) by mouth at bedtime for 1 day, THEN 1 tablet (100 mg total) daily. 02/26/22 03/29/22  Emeterio Reeve, DO  budesonide-formoterol [facility]) 160-4.5 MCG/ACT inhaler Inhale 2 puffs into the lungs 2 (two) times daily. 07/25/21   Ghimire, Henreitta Leber, MD  carbidopa-levodopa (SINEMET IR) 25-100 MG tablet Take 2 tablets by mouth 5 (five) times daily.    [provider]  Cyanocobalamin ([employer] VITAMIN B-12) 2000 MCG TBCR Take 2,000 mcg by mouth daily.    [provider]  ferrous gluconate (FERGON) 324 MG tablet Take 324 mg by mouth daily with breakfast.    [provider]  ipratropium-albuterol (DUONEB) 0.5-2.5 (3) MG/3ML SOLN Take 3 mLs by nebulization every 2 (two) hours as needed (ehzzeing / shortness of breath). 02/26/22   Emeterio Reeve, DO  metoprolol tartrate (LOPRESSOR) 25 MG tablet Take 1 tablet (25 mg total) by mouth 2 (two) times daily. 02/26/22   Emeterio Reeve, DO  montelukast (SINGULAIR) 10 MG tablet Take 10 mg by mouth at bedtime.    [provider]  omeprazole (PRILOSEC) 40 MG capsule Take 40 mg by mouth daily.  11/06/15   [provider]  QUEtiapine (SEROQUEL) 50 MG tablet Take 50 mg by mouth at bedtime.    [provider]  SUMAtriptan (IMITREX) 100 MG tablet Take by mouth. 11/15/21 11/15/22  [provider]  warfarin  (COUMADIN) 2.5 MG tablet Take 0.5 tablets (1.25 mg total) by mouth daily. 02/26/22   Emeterio Reeve, DO    Physical Exam: Vitals:   04/13/22 1215 04/13/22 1230 04/13/22 1245 04/13/22 1300  BP: 114/73 116/76 124/76   Pulse: 90 87 94   Resp: (!) 26 (!) 25 (!) 27   Temp:    100.3 F (37.9 C)  TempSrc:    Axillary  SpO2: 97% 97% 99%     Constitutional: NAD, calm, comfortable Vitals:   04/13/22 1215 04/13/22 1230 04/13/22 1245 04/13/22 1300  BP: 114/73 116/76 124/76   Pulse: 90 87 94   Resp: (!) 26 (!) 25 (!) 27   Temp:    100.3 F (37.9 C)  TempSrc:    Axillary  SpO2: 97% 97% 99%    Eyes: PERRL, lids and conjunctivae normal ENMT: Mucous membranes are dry. Posterior pharynx clear of any exudate or lesions.Normal dentition.  Neck: normal, supple, no masses, no thyromegaly Respiratory: Diminished breathing sound bilaterally, diffused wheezing, bilateral crackles to the right> left, increasing breathing effort no accessory muscle use.  Cardiovascular: Regular rate  and rhythm, no murmurs / rubs / gallops. No extremity edema. 2+ pedal pulses. No carotid bruits.  Abdomen: no tenderness, no masses palpated. No hepatosplenomegaly. Bowel sounds positive.  Musculoskeletal: no clubbing / cyanosis. No joint deformity upper and lower extremities. Good ROM, no contractures. Normal muscle tone.  Skin: no rashes, lesions, ulcers. No induration Neurologic: CN 2-12 grossly intact. Sensation intact, DTR normal. Strength 5/5 in all 4.  Psychiatric: Normal judgment and insight. Alert and oriented x 3. Normal mood.     Labs on Admission: I have personally reviewed following labs and imaging studies  CBC: Recent Labs  Lab 04/13/22 0940 04/13/22 1218  WBC 12.6*  --   NEUTROABS 9.1*  --   HGB 12.5* 11.2*  HCT 38.2* 33.0*  MCV 103.5*  --   PLT 262  --    Basic Metabolic Panel: Recent Labs  Lab 04/13/22 1132 04/13/22 1218  NA 147* 141  K 3.2* 3.4*  CL 113*  --   CO2 23  --   GLUCOSE  126*  --   BUN 17  --   CREATININE 0.85  --   CALCIUM 7.1*  --    GFR: CrCl cannot be calculated (Unknown ideal weight.). Liver Function Tests: No results for input(s): "AST", "ALT", "ALKPHOS", "BILITOT", "PROT", "ALBUMIN" in the last 168 hours. No results for input(s): "LIPASE", "AMYLASE" in the last 168 hours. No results for input(s): "AMMONIA" in the last 168 hours. Coagulation Profile: Recent Labs  Lab 04/13/22 0940  INR 2.1*   Cardiac Enzymes: No results for input(s): "CKTOTAL", "CKMB", "CKMBINDEX", "TROPONINI" in the last 168 hours. BNP (last 3 results) No results for input(s): "PROBNP" in the last 8760 hours. HbA1C: No results for input(s): "HGBA1C" in the last 72 hours. CBG: No results for input(s): "GLUCAP" in the last 168 hours. Lipid Profile: No results for input(s): "CHOL", "HDL", "LDLCALC", "TRIG", "CHOLHDL", "LDLDIRECT" in the last 72 hours. Thyroid Function Tests: No results for input(s): "TSH", "T4TOTAL", "FREET4", "T3FREE", "THYROIDAB" in the last 72 hours. Anemia Panel: No results for input(s): "VITAMINB12", "FOLATE", "FERRITIN", "TIBC", "IRON", "RETICCTPCT" in the last 72 hours. Urine analysis:    Component Value Date/Time   COLORURINE YELLOW (A) 02/20/2022 1250   APPEARANCEUR CLEAR (A) 02/20/2022 1250   APPEARANCEUR Clear 10/26/2012 1721   LABSPEC >1.046 (H) 02/20/2022 1250   LABSPEC 1.021 10/26/2012 1721   PHURINE 5.0 02/20/2022 1250   GLUCOSEU NEGATIVE 02/20/2022 1250   GLUCOSEU Negative 10/26/2012 1721   HGBUR SMALL (A) 02/20/2022 1250   HGBUR negative 01/01/2008 0000   BILIRUBINUR NEGATIVE 02/20/2022 1250   BILIRUBINUR Negative 10/26/2012 1721   KETONESUR NEGATIVE 02/20/2022 1250   PROTEINUR NEGATIVE 02/20/2022 1250   UROBILINOGEN 1.0 08/29/2010 0041   NITRITE NEGATIVE 02/20/2022 1250   LEUKOCYTESUR TRACE (A) 02/20/2022 1250   LEUKOCYTESUR 3+ 10/26/2012 1721    Radiological Exams on Admission: DG Chest Port 1 View  Result Date:  04/13/2022 CLINICAL DATA:  Cough SOB EXAM: PORTABLE CHEST 1 VIEW COMPARISON:  02/22/2022 FINDINGS: Diffuse pulmonary interstitial prominence consistent with pulmonary edema. Alveolar process mid to lower lung on the right consistent with pneumonia. No pneumothorax identified. Calcified aorta. Unremarkable cardiac silhouette. IMPRESSION: Interstitial edema versus interstitial lung disease and alveolar consolidation right mid to lower lung, likely pneumonia without interval change. Stability over fairly extended period of time suggests possible underlying process which would need to be excluded such as neoplasm. Electronically Signed   By: Sammie Bench M.D.   On: 04/13/2022 10:11  EKG: Independently reviewed.  Sinus rhythm, no acute ST changes.  Assessment/Plan Principal Problem:   PNA (pneumonia) Active Problems:   Acute on chronic respiratory failure with hypoxia (HCC)   Bacterial pneumonia   Acute bronchitis with chronic obstructive pulmonary disease (COPD) ([place])  (please populate well all problems here in Problem List. (For example, if patient is on BP meds at home and you resume or decide to hold them, it is a problem that needs to be her. Same for CAD, COPD, HLD and so on)  Sepsis -Evidenced by new onset of fever, hypoxia and tachycardia and elevated lactic acid, infection source is multifocal pneumonia, bacterial. -Continue empirical bacterial pneumonia coverage with ceftriaxone and azithromycin.  Reviewed patient's past but urine culture result, no recent positive sputum culture result to refer to.  He has low risk for Pseudomonas infection in respiratory system. -Send sputum culture -MRSA screening. -Aspiration pneumonia remain a major differential, patient denies any coughing or choking after eat or drink and has been compliant with dysphagia diet.  Silent aspiration from stomach regurgitation remain a concern, as patient reported episodic of throat pain in the morning, will check H.  pylori in the stool, and outpatient follow-up with PCP.  Now, will double dose of his PPI. -As patient was evaluated by speech therapist last less than 2 months ago, reevaluation for speech not indicated this time.  Multifocal pneumonia, recurrent -Management as above  Acute COPD exacerbation -Likely triggered by pneumonia -Management as above -Continue breathing treatment, inhaled steroid, Spiriva -Incentive spirometry and flutter valve  Acute on chronic hypoxic respiratory failure -Management as above  Hypernatremia -Clinically appears to be dehydrated, encourage increase p.o. fluid intake. -Recheck BMP tomorrow  Hypokalemia -P.o. replacement, recheck BMP tomorrow  PAF -Sinus rhythm, -Patient was started on amiodarone loading on last admission about 6 weeks ago, and patient already completed loading now with a maintenance amiodarone dosage.  Recommend him closely follow-up with pulmonary given his chronic lung texture changes concerning for risk of develop fibrosis in the future -Consult pharmacy for Coumadin dosage  Chronic HFpEF -Clinically appears to be euvolemic, blood pressure stable, continue current metoprolol regimen.  Parkinson disease -Fairly controlled, continue current dosage of Sinemet  Peptic ulcer -Double dose PPI -H. pylori stool antigen  DVT prophylaxis: Coumadin Code Status: DNR Family Communication: ED talked to wife over phone Disposition Plan: Patient is sick with combined multifocal pneumonia and COPD exacerbation hypoxia, requiring inpatient antibiotics treatment, expect more than 2 midnight hospital stay. Consults called: None Admission status: Tele admit   Lequita Halt MD [facility] Pager [phone]  04/13/2022, 1:37 PM

## 2022-04-13 NOTE — ED Notes (Signed)
Please call spouse [name] [phone] advised this is her 3rd time calling and she is very concerned about her husband.  Nurse states she is in another room and can not talk--S/w charge nurse [place] she advised she would call her back

## 2022-04-13 NOTE — Progress Notes (Addendum)
ANTICOAGULATION CONSULT NOTE - Initial Consult  Pharmacy Consult for warfarin Indication: atrial fibrillation  No Known Allergies  Patient Measurements:    Vital Signs: Temp: 100.3 F (37.9 C) (12/02 1300) Temp Source: Axillary (12/02 1300) BP: 124/76 (12/02 1245) Pulse Rate: 94 (12/02 1245)  Labs: Recent Labs    04/13/22 0940 04/13/22 1132 04/13/22 1218  HGB 12.5*  --  11.2*  HCT 38.2*  --  33.0*  PLT 262  --   --   APTT 40*  --   --   LABPROT 23.1*  --   --   INR 2.1*  --   --   CREATININE  --  0.85  --     CrCl cannot be calculated (Unknown ideal weight.).   Medical History: Past Medical History:  Diagnosis Date   Basal cell carcinoma 07/25/2015   L zygoma inf to lat canthus   Basal cell carcinoma 06/11/2018   L nasal ala   Basal cell carcinoma 10/04/2019   Left nose. Infiltrative pattern.   BPH (benign prostatic hyperplasia)    COPD (chronic obstructive pulmonary disease) (HCC)    Elevated liver enzymes    Emphysema of lung (HCC)    Gastric ulcer    GERD (gastroesophageal reflux disease)    Hepatitis B    Hypertension    Parkinson's disease    Prostate hypertrophy    Rotator cuff tear right   UTI (urinary tract infection)     Medications:  (Not in a hospital admission)   Assessment: 81 yo M BIB EMS to ED for respiratory distress 2/2 CAP diagnosed by PCP on 04/12/22. Pt started on IV ceftriaxone and azithromycin and IV fluid resuscitation per sepsis protocol on 04/13/22 in ED. Pt is a hospice patient. Pt has a PMH of esophageal dysphagia on mechanical soft/dysphagia 3 diet, COPD, chronic HFpEF, chronic hypoxic respiratory failure on 2L O2 at home, GERD, HTN and PAF (on warfarin). Pharmacy was consulted to dose and manage warfarin for Afib on admission.   Pt reports taking warfarin '5mg'$  tablets - taking 1/2 tablet, so 2.'5mg'$  daily at ~21:00-22:00 daily. Last dose 2.'5mg'$  on 12/1 ~22:00.   INR 2.1 on admission Hgb 11.2, Plt 262 No s/sx of bleeding  noted  Goal of Therapy:  INR 2-3 Monitor platelets by anticoagulation protocol: Yes   Plan:  Warfarin 2.'5mg'$  IV x1 tonight Per discussion with Dr. Roosevelt Locks, transition azithromycin to doxycycline on 04/14/22 to minimize risk for drug interaction and increased INR with warfarin. Monitor daily CBC, INR, and for s/sx of bleeding   Luisa Hart, PharmD, BCPS Clinical Pharmacist 04/13/2022 3:53 PM   Please refer to [facility] for pharmacy phone number

## 2022-04-13 NOTE — Sepsis Progress Note (Signed)
Sepsis protocol is being followed by [facility]. 

## 2022-04-13 NOTE — ED Provider Notes (Signed)
[facility] EMERGENCY DEPARTMENT Provider Note   CSN: [id number] Arrival date & time: 04/13/22  4098     History  Chief Complaint  Patient presents with   Shortness of Breath    Patrick Montoya is a 81 y.o. male.  81 year old male presents with acute dyspnea.  Diagnosed with community-acquired pneumonia yesterday at his doctor's office.  Has been sick for the past several days.  Has had increasing cough congestion and fever.  According to wife who I spoke with on the phone patient did not get his antibiotics filled.  He is a hospice patient.  She confirmed that he is a DNR/DNI.  The patient also confirmed this as well to.  EMS called and patient was found to be in respiratory stress and placed on CPAP.  Was given albuterol nebulizer and transported here       Home Medications Prior to Admission medications   Medication Sig Start Date End Date Taking? Authorizing Provider  albuterol (VENTOLIN HFA) 108 (90 Base) MCG/ACT inhaler Inhale 2 puffs into the lungs every 6 (six) hours as needed for wheezing or shortness of breath. 02/26/22   Emeterio Reeve, DO  ALPRAZolam Duanne Moron) 0.5 MG tablet Take 0.5 mg by mouth at bedtime as needed for anxiety or sleep.    [provider]  amiodarone (PACERONE) 100 MG tablet Take 2 tablets (200 mg total) by mouth at bedtime for 1 day, THEN 1 tablet (100 mg total) daily. 02/26/22 03/29/22  Emeterio Reeve, DO  budesonide-formoterol [facility]) 160-4.5 MCG/ACT inhaler Inhale 2 puffs into the lungs 2 (two) times daily. 07/25/21   Ghimire, Henreitta Leber, MD  carbidopa-levodopa (SINEMET IR) 25-100 MG tablet Take 2 tablets by mouth 5 (five) times daily.    [provider]  Cyanocobalamin ([employer] VITAMIN B-12) 2000 MCG TBCR Take 2,000 mcg by mouth daily.    [provider]  ferrous gluconate (FERGON) 324 MG tablet Take 324 mg by mouth daily with breakfast.    [provider]  ipratropium-albuterol (DUONEB)  0.5-2.5 (3) MG/3ML SOLN Take 3 mLs by nebulization every 2 (two) hours as needed (ehzzeing / shortness of breath). 02/26/22   Emeterio Reeve, DO  metoprolol tartrate (LOPRESSOR) 25 MG tablet Take 1 tablet (25 mg total) by mouth 2 (two) times daily. 02/26/22   Emeterio Reeve, DO  montelukast (SINGULAIR) 10 MG tablet Take 10 mg by mouth at bedtime.    [provider]  omeprazole (PRILOSEC) 40 MG capsule Take 40 mg by mouth daily.  11/06/15   [provider]  QUEtiapine (SEROQUEL) 50 MG tablet Take 50 mg by mouth at bedtime.    [provider]  SUMAtriptan (IMITREX) 100 MG tablet Take by mouth. 11/15/21 11/15/22  [provider]  warfarin (COUMADIN) 2.5 MG tablet Take 0.5 tablets (1.25 mg total) by mouth daily. 02/26/22   Emeterio Reeve, DO      Allergies    Patient has no known allergies.    Review of Systems   Review of Systems  All other systems reviewed and are negative.   Physical Exam Updated Vital Signs There were no vitals taken for this visit. Physical Exam Vitals and nursing note reviewed.  Constitutional:      General: He is not in acute distress.    Appearance: Normal appearance. He is well-developed. He is not toxic-appearing.  HENT:     Head: Normocephalic and atraumatic.  Eyes:     General: Lids are normal.     Conjunctiva/sclera:  Conjunctivae normal.     Pupils: Pupils are equal, round, and reactive to light.  Neck:     Thyroid: No thyroid mass.     Trachea: No tracheal deviation.  Cardiovascular:     Rate and Rhythm: Normal rate and regular rhythm.     Heart sounds: Normal heart sounds. No murmur heard.    No gallop.  Pulmonary:     Effort: Tachypnea and respiratory distress present.     Breath sounds: No stridor. Examination of the right-upper field reveals decreased breath sounds and rhonchi. Examination of the left-upper field reveals decreased breath sounds and rhonchi. Decreased breath sounds and rhonchi present. No  wheezing or rales.  Abdominal:     General: There is no distension.     Palpations: Abdomen is soft.     Tenderness: There is no abdominal tenderness. There is no rebound.  Musculoskeletal:        General: No tenderness. Normal range of motion.     Cervical back: Normal range of motion and neck supple.  Skin:    General: Skin is warm and dry.     Findings: No abrasion or rash.  Neurological:     General: No focal deficit present.     Mental Status: He is alert and oriented to person, place, and time. Mental status is at baseline.     GCS: GCS eye subscore is 4. GCS verbal subscore is 5. GCS motor subscore is 6.     Cranial Nerves: No cranial nerve deficit.     Sensory: No sensory deficit.     Motor: Motor function is intact.  Psychiatric:        Attention and Perception: He is inattentive.        Mood and Affect: Mood is anxious.     ED Results / Procedures / Treatments   Labs (all labs ordered are listed, but only abnormal results are displayed) Labs Reviewed  CULTURE, BLOOD (ROUTINE X 2)  CULTURE, BLOOD (ROUTINE X 2)  RESP PANEL BY RT-PCR (FLU A&B, COVID) ARPGX2  URINE CULTURE  BRAIN NATRIURETIC PEPTIDE  CBC WITH DIFFERENTIAL/PLATELET  BASIC METABOLIC PANEL  LACTIC ACID, PLASMA  LACTIC ACID, PLASMA  PROTIME-INR  APTT  URINALYSIS, ROUTINE W REFLEX MICROSCOPIC    EKG EKG Interpretation  Date/Time:  Saturday April 13 2022 09:41:31 EST Ventricular Rate:  134 PR Interval:  76 QRS Duration: 80 QT Interval:  299 QTC Calculation: 447 R Axis:   10 Text Interpretation: Sinus or ectopic atrial tachycardia Confirmed by Lacretia Leigh ([id number]) on 04/13/2022 9:47:02 AM  Radiology No results found.  Procedures Procedures    Medications Ordered in ED Medications  0.9 %  sodium chloride infusion (has no administration in time range)  lactated ringers infusion (has no administration in time range)  cefTRIAXone (ROCEPHIN) 2 g in sodium chloride 0.9 % 100 mL IVPB (has  no administration in time range)  azithromycin (ZITHROMAX) 500 mg in sodium chloride 0.9 % 250 mL IVPB (has no administration in time range)    ED Course/ Medical Decision Making/ A&P                           Medical Decision Making Amount and/or Complexity of Data Reviewed Labs: ordered. Radiology: ordered. ECG/medicine tests: ordered.  Risk OTC drugs. Prescription drug management.   Patient is EKG per my interpretation Shows signs of atrial tachycardia.  Chest x-ray per my interpretation consistent with pneumonia.  Code  sepsis have been initiated.  Due to concern for possible fluid overload patient given IV bolus of 30/kg here.  Patient was treated with Tylenol for fever.  Started on empiric antibiotics.  Patient placed on BiPAP here in respiratory status improved.  Patient is confirmed to be a DNR as mentioned above.  Patient is ABG per my interpretation shows no signs of hypercapnia.  We will try to transition patient off of BiPAP to nonrebreather.  Patient's COVID and flu are negative here.  BNP is negative.  Continue with gentle hydration.  Lactate is elevated.  Plan will be to admit to the hospitalist service  CRITICAL CARE Performed by: Leota Jacobsen Total critical care time: 60 minutes Critical care time was exclusive of separately billable procedures and treating other patients. Critical care was necessary to treat or prevent imminent or life-threatening deterioration. Critical care was time spent personally by me on the following activities: development of treatment plan with patient and/or surrogate as well as nursing, discussions with consultants, evaluation of patient's response to treatment, examination of patient, obtaining history from patient or surrogate, ordering and performing treatments and interventions, ordering and review of laboratory studies, ordering and review of radiographic studies, pulse oximetry and re-evaluation of patient's  condition.         Final Clinical Impression(s) / ED Diagnoses Final diagnoses:  None    Rx / DC Orders ED Discharge Orders     None         Lacretia Leigh, MD 04/13/22 1304

## 2022-04-13 NOTE — ED Triage Notes (Signed)
Patient w/ ShoB x 1 week. Pt dx w/ PNA yesterday unable to take antibiotics yet. Patient brought in on cpap w/ EMS Spo2 in the 80s, RR in the 50s. Patient with thick mucous. EMS administered duonebs PTA. 2 SL nitros given PTA as BP was in the 825P systolic but improved to 898 on arrival.   Hx of COPD, emphysema, patient on hospice and DNR.

## 2022-04-13 NOTE — Progress Notes (Signed)
Placed pt on BiPAP, pt tolerating well at this time

## 2022-04-13 NOTE — ED Notes (Signed)
[name] (wife) would like to be called with a status update on pt. Phone is [phone]

## 2022-04-13 NOTE — ED Notes (Signed)
Mila Merry (Son) called asking for an update. His call is [phone].

## 2022-04-13 NOTE — ED Notes (Signed)
Updated Mariann Laster w/ [facility].

## 2022-04-13 NOTE — Progress Notes (Signed)
[facility] ([facility]) Hospital Liaison Note   This is a current [facility] hospice patient. Please call with any hospice questions or concerns. Thank you.   Sarah L. Martin, RN [facility] Hospital Liaison [phone] 

## 2022-04-14 ENCOUNTER — Encounter (HOSPITAL_COMMUNITY): Payer: Self-pay | Admitting: Internal Medicine

## 2022-04-14 ENCOUNTER — Inpatient Hospital Stay (HOSPITAL_COMMUNITY)

## 2022-04-14 DIAGNOSIS — J209 Acute bronchitis, unspecified: Secondary | ICD-10-CM

## 2022-04-14 DIAGNOSIS — J440 Chronic obstructive pulmonary disease with acute lower respiratory infection: Secondary | ICD-10-CM

## 2022-04-14 DIAGNOSIS — J9621 Acute and chronic respiratory failure with hypoxia: Secondary | ICD-10-CM

## 2022-04-14 DIAGNOSIS — J159 Unspecified bacterial pneumonia: Secondary | ICD-10-CM | POA: Diagnosis not present

## 2022-04-14 LAB — PROCALCITONIN: Procalcitonin: 5.68 ng/mL

## 2022-04-14 LAB — CBC
HCT: 30.3 % — ABNORMAL LOW (ref 39.0–52.0)
Hemoglobin: 9.5 g/dL — ABNORMAL LOW (ref 13.0–17.0)
MCH: 33.3 pg (ref 26.0–34.0)
MCHC: 31.4 g/dL (ref 30.0–36.0)
MCV: 106.3 fL — ABNORMAL HIGH (ref 80.0–100.0)
Platelets: 172 10*3/uL (ref 150–400)
RBC: 2.85 MIL/uL — ABNORMAL LOW (ref 4.22–5.81)
RDW: 14.9 % (ref 11.5–15.5)
WBC: 14.9 10*3/uL — ABNORMAL HIGH (ref 4.0–10.5)
nRBC: 0 % (ref 0.0–0.2)

## 2022-04-14 LAB — BASIC METABOLIC PANEL
Anion gap: 6 (ref 5–15)
BUN: 16 mg/dL (ref 8–23)
CO2: 26 mmol/L (ref 22–32)
Calcium: 7.7 mg/dL — ABNORMAL LOW (ref 8.9–10.3)
Chloride: 112 mmol/L — ABNORMAL HIGH (ref 98–111)
Creatinine, Ser: 0.74 mg/dL (ref 0.61–1.24)
GFR, Estimated: >60 mL/min (ref >60)
Glucose, Bld: 118 mg/dL — ABNORMAL HIGH (ref 70–99)
Potassium: 4.1 mmol/L (ref 3.5–5.1)
Sodium: 144 mmol/L (ref 135–145)

## 2022-04-14 LAB — URINE CULTURE

## 2022-04-14 LAB — PROTIME-INR
INR: 3.1 — ABNORMAL HIGH (ref 0.8–1.2)
Prothrombin Time: 31.5 seconds — ABNORMAL HIGH (ref 11.4–15.2)

## 2022-04-14 MED ORDER — WARFARIN SODIUM 1 MG PO TABS
1.0000 mg | ORAL_TABLET | Freq: Once | ORAL | Status: DC
  Filled 2022-04-14: qty 1

## 2022-04-14 MED ORDER — IPRATROPIUM-ALBUTEROL 0.5-2.5 (3) MG/3ML IN SOLN
3.0000 mL | Freq: Four times a day (QID) | RESPIRATORY_TRACT | Status: DC | PRN
  Administered 2022-04-15: 3 mL via RESPIRATORY_TRACT
  Filled 2022-04-14: qty 3

## 2022-04-14 MED ORDER — WARFARIN 0.5 MG HALF TABLET
0.5000 mg | ORAL_TABLET | Freq: Once | ORAL | Status: AC
  Administered 2022-04-14: 0.5 mg via ORAL
  Filled 2022-04-14: qty 1

## 2022-04-14 NOTE — ED Notes (Signed)
Pt O2 hovering between 88-89%. RN raised O2 to 5L. Pt is Aox4 and HOB 45.  Pt states he wears O2 at home at 3L

## 2022-04-14 NOTE — ED Notes (Signed)
RN fixed pt linens and adjusted pt in bed. RN setup breakfast tray. Pt is currently eating food.

## 2022-04-14 NOTE — Progress Notes (Signed)
[facility] ED 039 [facility]) Hospital Liaison Note    Mr. Nadal is a current hospice patient with a terminal diagnosis of COPD. Patient was brought to [facility] ED by his wife due to low 02 sats in the 80's. Patient was evaluated in the [facility] ED and was admitted for pneumonia. Per Dr. Karie Georges, [facility] MD, this is a related hospital admission.    Mr. Brumbaugh is still in the ED. Patient is alert and conversant.  He is currently receiving IV antibiotics and fluids. Patient's is currently on 5L of 02. Called wife, [name] via telephone to provide an update. Report exchanged with RN.   Patient is inpatient appropriate due to receiving IV antibiotics for treatment of pneumonia.    V/S: 140/78 bp, 97.6 Temp, 111 bpm, 24 RR, 93% on 5L/min  I/O: 302.4/400  Labs:  Lactic Acid, Venous: 2.7 (HH) WBC: 12.6 (H) RBC: 3.69 (L) Hemoglobin: 12.5 (L) HCT: 38.2 (L) MCV: 103.5 (H) NEUT#: 9.1 (H) Prothrombin Time: 23.1 (H) INR: 2.1 (H) APTT: 40 (H) Sodium: 147 (H) Potassium: 3.2 (L) Chloride: 113 (H) Glucose: 126 (H) Calcium: 7.1 (L) pO2, Arterial: 115 (H) Potassium: 3.4 (L) Hemoglobin: 11.2 (L) HCT: 33.0 (L)  Diagnostics:  Chest X-ray  IMPRESSION: Interstitial edema versus interstitial lung disease and alveolar consolidation right mid to lower lung, likely pneumonia without interval change. Stability over fairly extended period of time suggests possible underlying process which would need to be excluded such as neoplasm.  IV/PRN:  0.9 % sodium chloride infusion Rate: 10 mL/hr Freq: Continuous Route: IV x1  azithromycin (ZITHROMAX) 500 mg in sodium chloride 0.9 % 250 mL IVPB Dose: 500 mg Freq: Every 24 hours Route: IV x1  cefTRIAXone (ROCEPHIN) 2 g in sodium chloride 0.9 % 100 mL IVPB Dose: 2 g Freq: Every 24 hours Route: IV x2   magnesium sulfate IVPB 2 g 50 mL Dose: 2 g Freq:  Once Route: IV x1   ALPRAZolam (XANAX) tablet 0.5 mg Dose: 0.5 mg Freq: At bedtime PRN Route: PO  x1   Problem list: Assessment/Plan Principal Problem:   PNA (pneumonia) Active Problems:   Acute on chronic respiratory failure with hypoxia (HCC)   Bacterial pneumonia   Acute bronchitis with chronic obstructive pulmonary disease (COPD) ([place])  (please populate well all problems here in Problem List. (For example, if patient is on BP meds at home and you resume or decide to hold them, it is a problem that needs to be her. Same for CAD, COPD, HLD and so on)   Sepsis -Evidenced by new onset of fever, hypoxia and tachycardia and elevated lactic acid, infection source is multifocal pneumonia, bacterial. -Continue empirical bacterial pneumonia coverage with ceftriaxone and azithromycin.  Reviewed patient's past but urine culture result, no recent positive sputum culture result to refer to.  He has low risk for Pseudomonas infection in respiratory system. -Send sputum culture -MRSA screening. -Aspiration pneumonia remain a major differential, patient denies any coughing or choking after eat or drink and has been compliant with dysphagia diet.  Silent aspiration from stomach regurgitation remain a concern, as patient reported episodic of throat pain in the morning, will check H. pylori in the stool, and outpatient follow-up with PCP.  Now, will double dose of his PPI. -As patient was evaluated by speech therapist last less than 2 months ago, reevaluation for speech not indicated this time.   Multifocal pneumonia, recurrent -Management as above   Acute COPD exacerbation -Likely triggered by pneumonia -Management as above -  Continue breathing treatment, inhaled steroid, Spiriva -Incentive spirometry and flutter valve   Acute on chronic hypoxic respiratory failure -Management as above   Hypernatremia -Clinically appears to be dehydrated, encourage increase p.o. fluid intake. -Recheck BMP tomorrow   Hypokalemia -P.o. replacement, recheck BMP tomorrow   PAF -Sinus rhythm, -Patient was  started on amiodarone loading on last admission about 6 weeks ago, and patient already completed loading now with a maintenance amiodarone dosage.  Recommend him closely follow-up with pulmonary given his chronic lung texture changes concerning for risk of develop fibrosis in the future -Consult pharmacy for Coumadin dosage   Chronic HFpEF -Clinically appears to be euvolemic, blood pressure stable, continue current metoprolol regimen.   Parkinson disease -Fairly controlled, continue current dosage of Sinemet   Peptic ulcer -Double dose PPI -H. pylori stool antigen  GOC: Patient is a DNR.    D/C planning: Ongoing.    Family: Called wife, [name] via telephone to provide an update.  IDT: Updated   Zigmund Gottron, RN [facility] Liaison  [phone]

## 2022-04-14 NOTE — Progress Notes (Signed)
[facility]  PROGRESS NOTE  Patrick Montoya TAV:[id number] DOB: 1940/07/03 DOA: 04/13/2022 PCP: Tracie Harrier, MD   Brief HPI:   81 year old male with medical history of recurrent multifocal pneumonia in the last 1 to 2 years, esophageal dysphagia on mechanical soft/dysphagia 3 diet, COPD Gold stage III, chronic HFpEF, chronic hypoxemic respiratory failure on 2 L of oxygen, peptic ulcer on PPI, hypertension, PAF on Coumadin presented with worsening productive cough, wheezing and shortness of breath. Patient was recently seen at [facility] pulmonology 3 days ago, repeat chest showed persistent multifocal infiltrates and plan multifocal pneumonia.  Patient was started on doxycycline twice a day.  The hospital patient was febrile with temperature one 2.2, required BiPAP briefly chest x-ray showed multifocal pneumonia.    Subjective   Patient seen and examined, denies chest pain.  Still has mild shortness of breath.   Assessment/Plan:    Sepsis -Presented with fever, hypoxemia, tachycardia, lactic acid elevation, chest x-ray confirmed multifocal pneumonia -Continue ceftriaxone and doxycycline -Follow blood culture results -Patient declined evaluation by speech therapy as he was seen by speech therapy less than 2 months ago  Multifocal pneumonia, recurrent -Chest x-ray obtained shows interstitial edema versus interstitial lung disease and irregular consolidation, no interval change.  Due to stability of lesion over fairly extensive enough time suggest possible underlying process like neoplasm which is to be ruled out. -We will obtain high-resolution CT chest to rule out interstitial lung disease/neoplasm -Continue ceftriaxone and doxycycline as above  COPD exacerbation -Started on steroids with prednisone -DuoNeb every 6 hours as needed  Acute on chronic hypoxemic respiratory failure -Management as above  Paroxysmal atrial fibrillation -Patient will be out of A-fib -Continue  anticoagulation with warfarin -Continue low-dose amiodarone  Chronic HFpEF -Stable euvolemic -Continue metoprolol  Parkinson's disease -Fairly well controlled -Continue Sinemet  History of peptic ulcer -Continue PPI -H. pylori stool antigen     Medications     amiodarone  100 mg Oral Daily   carbidopa-levodopa  2 tablet Oral 5 X Daily   doxycycline  100 mg Oral Q12H   ferrous gluconate  324 mg Oral Q lunch   guaiFENesin  1,200 mg Oral BID   metoprolol tartrate  25 mg Oral BID   mometasone-formoterol  2 puff Inhalation BID   montelukast  10 mg Oral QHS   pantoprazole  40 mg Oral BID AC   predniSONE  40 mg Oral Q breakfast   QUEtiapine  50 mg Oral QHS   Warfarin - Pharmacist Dosing Inpatient   Does not apply q1600     Data Reviewed:   CBG:  No results for input(s): "GLUCAP" in the last 168 hours.  SpO2: 93 % O2 Flow Rate (L/min): 5 L/min FiO2 (%): 70 %    Vitals:   04/14/22 1118 04/14/22 1200 04/14/22 1300 04/14/22 1309  BP: (!) 152/70 (!) 160/80 (!) 140/78   Pulse: 88 90 (!) 111   Resp:  (!) 21 (!) 24   Temp:    97.6 F (36.4 C)  TempSrc:    Oral  SpO2:  92% 93%       Data Reviewed:  Basic Metabolic Panel: Recent Labs  Lab 04/13/22 1132 04/13/22 1218 04/14/22 0400  NA 147* 141 144  K 3.2* 3.4* 4.1  CL 113*  --  112*  CO2 23  --  26  GLUCOSE 126*  --  118*  BUN 17  --  16  CREATININE 0.85  --  0.74  CALCIUM 7.1*  --  7.7*    CBC: Recent Labs  Lab 04/13/22 0940 04/13/22 1218 04/14/22 0400  WBC 12.6*  --  14.9*  NEUTROABS 9.1*  --   --   HGB 12.5* 11.2* 9.5*  HCT 38.2* 33.0* 30.3*  MCV 103.5*  --  106.3*  PLT 262  --  172    LFT No results for input(s): "AST", "ALT", "ALKPHOS", "BILITOT", "PROT", "ALBUMIN" in the last 168 hours.   Antibiotics: Anti-infectives (From admission, onward)    Start     Dose/Rate Route Frequency Ordered Stop   04/14/22 1000  azithromycin (ZITHROMAX) 500 mg in sodium chloride 0.9 % 250 mL IVPB   Status:  Discontinued        500 mg 250 mL/hr over 60 Minutes Intravenous Every 24 hours 04/13/22 1336 04/13/22 1559   04/14/22 1000  doxycycline (VIBRA-TABS) tablet 100 mg        100 mg Oral Every 12 hours 04/13/22 1559 04/18/22 0959   04/13/22 1345  cefTRIAXone (ROCEPHIN) 2 g in sodium chloride 0.9 % 100 mL IVPB  Status:  Discontinued        2 g 200 mL/hr over 30 Minutes Intravenous Every 24 hours 04/13/22 1336 04/13/22 1345   04/13/22 1000  cefTRIAXone (ROCEPHIN) 2 g in sodium chloride 0.9 % 100 mL IVPB        2 g 200 mL/hr over 30 Minutes Intravenous Every 24 hours 04/13/22 0946 04/18/22 0959   04/13/22 1000  azithromycin (ZITHROMAX) 500 mg in sodium chloride 0.9 % 250 mL IVPB  Status:  Discontinued        500 mg 250 mL/hr over 60 Minutes Intravenous Every 24 hours 04/13/22 0946 04/13/22 1345        DVT prophylaxis: Warfarin  Code Status: DNR  Family Communication: No family at bedside   CONSULTS    Objective    Physical Examination:   General: Appears in no acute distress Cardiovascular: S1-S2, regular, no murmur auscultated Respiratory: Bilateral rhonchi auscultated Abdomen: Soft, nontender, no organomegaly Extremities: No edema in the lower extremities Neurologic: Alert, oriented x3, intact insight and judgment, no focal deficit noted   Status is: Inpatient:             Oswald Hillock   [facility] If 7PM-7AM, please contact night-coverage at [email or web address], Office  [phone]   04/14/2022, 2:03 PM  LOS: 1 day

## 2022-04-14 NOTE — Progress Notes (Signed)
ANTICOAGULATION CONSULT NOTE - Initial Consult  Pharmacy Consult for warfarin Indication: atrial fibrillation  No Known Allergies  Patient Measurements:    Vital Signs: Temp: 97.8 F (36.6 C) (12/03 1700) Temp Source: Oral (12/03 1700) BP: 182/84 (12/03 1700) Pulse Rate: 90 (12/03 1700)  Labs: Recent Labs    04/13/22 0940 04/13/22 1132 04/13/22 1218 04/14/22 0400  HGB 12.5*  --  11.2* 9.5*  HCT 38.2*  --  33.0* 30.3*  PLT 262  --   --  172  APTT 40*  --   --   --   LABPROT 23.1*  --   --  31.5*  INR 2.1*  --   --  3.1*  CREATININE  --  0.85  --  0.74     CrCl cannot be calculated (Unknown ideal weight.).   Medical History: Past Medical History:  Diagnosis Date   Basal cell carcinoma 07/25/2015   L zygoma inf to lat canthus   Basal cell carcinoma 06/11/2018   L nasal ala   Basal cell carcinoma 10/04/2019   Left nose. Infiltrative pattern.   BPH (benign prostatic hyperplasia)    COPD (chronic obstructive pulmonary disease) (HCC)    Elevated liver enzymes    Emphysema of lung (HCC)    Gastric ulcer    GERD (gastroesophageal reflux disease)    Hepatitis B    Hypertension    Parkinson's disease    Prostate hypertrophy    Rotator cuff tear right   UTI (urinary tract infection)     Medications:  (Not in a hospital admission)  Assessment: 81 yo M BIB EMS to ED for respiratory distress 2/2 CAP diagnosed by PCP on 04/12/22. Pt started on IV ceftriaxone and azithromycin and IV fluid resuscitation per sepsis protocol on 04/13/22 in ED. Pt is a hospice patient. Pt has a PMH of esophageal dysphagia on mechanical soft/dysphagia 3 diet, COPD, chronic HFpEF, chronic hypoxic respiratory failure on 2L O2 at home, GERD, HTN and PAF (on warfarin). Pharmacy was consulted to dose and manage warfarin for Afib on admission.   Pt reports taking warfarin '5mg'$  tablets - taking 1/2 tablet, so 2.'5mg'$  daily at ~21:00-22:00 daily. Last dose 2.'5mg'$  on 12/1 ~22:00.   INR increased to 3.1  from 2.1 on 12/3, slightly supratherapeutic Hgb 9.5, Plt 172 - trending down No s/sx of bleeding noted  Goal of Therapy:  INR 2-3 Monitor platelets by anticoagulation protocol: Yes   Plan:  Warfarin 0.'5mg'$  PO x1 tonight Monitor daily CBC, INR, and for s/sx of bleeding   Luisa Hart, PharmD, BCPS Clinical Pharmacist 04/14/2022 5:38 PM   Please refer to AMION for pharmacy phone number

## 2022-04-14 NOTE — ED Notes (Signed)
Pt HR 130-140's in afib rhythm. Pt has hx of afib. Pt states he can feel slight pressure that comes and go.   NT capture EKG and provider notified. RN still awaiting pt amiodarone medication.

## 2022-04-15 DIAGNOSIS — J189 Pneumonia, unspecified organism: Secondary | ICD-10-CM | POA: Diagnosis not present

## 2022-04-15 LAB — BASIC METABOLIC PANEL
Anion gap: 7 (ref 5–15)
BUN: 19 mg/dL (ref 8–23)
CO2: 25 mmol/L (ref 22–32)
Calcium: 8.4 mg/dL — ABNORMAL LOW (ref 8.9–10.3)
Chloride: 108 mmol/L (ref 98–111)
Creatinine, Ser: 0.84 mg/dL (ref 0.61–1.24)
GFR, Estimated: >60 mL/min (ref >60)
Glucose, Bld: 93 mg/dL (ref 70–99)
Potassium: 3.7 mmol/L (ref 3.5–5.1)
Sodium: 140 mmol/L (ref 135–145)

## 2022-04-15 LAB — EXPECTORATED SPUTUM ASSESSMENT W GRAM STAIN, RFLX TO RESP C

## 2022-04-15 LAB — CBC
HCT: 30.2 % — ABNORMAL LOW (ref 39.0–52.0)
Hemoglobin: 9.6 g/dL — ABNORMAL LOW (ref 13.0–17.0)
MCH: 33.4 pg (ref 26.0–34.0)
MCHC: 31.8 g/dL (ref 30.0–36.0)
MCV: 105.2 fL — ABNORMAL HIGH (ref 80.0–100.0)
Platelets: 174 10*3/uL (ref 150–400)
RBC: 2.87 MIL/uL — ABNORMAL LOW (ref 4.22–5.81)
RDW: 15.1 % (ref 11.5–15.5)
WBC: 12.6 10*3/uL — ABNORMAL HIGH (ref 4.0–10.5)
nRBC: 0 % (ref 0.0–0.2)

## 2022-04-15 LAB — PROTIME-INR
INR: 2.6 — ABNORMAL HIGH (ref 0.8–1.2)
Prothrombin Time: 27.4 seconds — ABNORMAL HIGH (ref 11.4–15.2)

## 2022-04-15 LAB — PROCALCITONIN: Procalcitonin: 4.2 ng/mL

## 2022-04-15 MED ORDER — FUROSEMIDE 10 MG/ML IJ SOLN
40.0000 mg | Freq: Once | INTRAMUSCULAR | Status: AC
  Administered 2022-04-15: 40 mg via INTRAVENOUS
  Filled 2022-04-15: qty 4

## 2022-04-15 MED ORDER — ORAL CARE MOUTH RINSE
15.0000 mL | OROMUCOSAL | Status: DC
  Administered 2022-04-15 – 2022-04-17 (×10): 15 mL via OROMUCOSAL

## 2022-04-15 MED ORDER — PNEUMOCOCCAL 20-VAL CONJ VACC 0.5 ML IM SUSY
0.5000 mL | PREFILLED_SYRINGE | INTRAMUSCULAR | Status: AC
  Administered 2022-04-17: 0.5 mL via INTRAMUSCULAR
  Filled 2022-04-15: qty 0.5

## 2022-04-15 MED ORDER — FLUTICASONE PROPIONATE 50 MCG/ACT NA SUSP
1.0000 | Freq: Every day | NASAL | Status: DC
  Administered 2022-04-16 – 2022-04-18 (×3): 1 via NASAL
  Filled 2022-04-15 (×3): qty 16

## 2022-04-15 MED ORDER — METRONIDAZOLE 500 MG/100ML IV SOLN
500.0000 mg | Freq: Two times a day (BID) | INTRAVENOUS | Status: DC
  Administered 2022-04-15: 500 mg via INTRAVENOUS
  Filled 2022-04-15: qty 100

## 2022-04-15 MED ORDER — ACETAMINOPHEN 325 MG PO TABS
650.0000 mg | ORAL_TABLET | Freq: Four times a day (QID) | ORAL | Status: DC | PRN
  Administered 2022-04-16: 650 mg via ORAL
  Filled 2022-04-15: qty 2

## 2022-04-15 MED ORDER — BUDESONIDE 0.25 MG/2ML IN SUSP
0.2500 mg | Freq: Two times a day (BID) | RESPIRATORY_TRACT | Status: DC
  Administered 2022-04-15 – 2022-04-18 (×7): 0.25 mg via RESPIRATORY_TRACT
  Filled 2022-04-15 (×7): qty 2

## 2022-04-15 MED ORDER — IPRATROPIUM-ALBUTEROL 0.5-2.5 (3) MG/3ML IN SOLN: 3.0000 mL | Freq: Four times a day (QID) | RESPIRATORY_TRACT | Status: DC

## 2022-04-15 MED ORDER — PIPERACILLIN-TAZOBACTAM 3.375 G IVPB
3.3750 g | Freq: Three times a day (TID) | INTRAVENOUS | Status: DC
  Administered 2022-04-15 – 2022-04-18 (×8): 3.375 g via INTRAVENOUS
  Filled 2022-04-15 (×8): qty 50

## 2022-04-15 MED ORDER — ARFORMOTEROL TARTRATE 15 MCG/2ML IN NEBU
15.0000 ug | INHALATION_SOLUTION | Freq: Two times a day (BID) | RESPIRATORY_TRACT | Status: DC
  Administered 2022-04-15 – 2022-04-18 (×7): 15 ug via RESPIRATORY_TRACT
  Filled 2022-04-15 (×8): qty 2

## 2022-04-15 MED ORDER — ORAL CARE MOUTH RINSE: 15.0000 mL | OROMUCOSAL | Status: DC | PRN

## 2022-04-15 MED ORDER — WARFARIN SODIUM 2.5 MG PO TABS
2.5000 mg | ORAL_TABLET | Freq: Once | ORAL | Status: AC
  Administered 2022-04-15: 2.5 mg via ORAL
  Filled 2022-04-15: qty 1

## 2022-04-15 MED ORDER — IPRATROPIUM-ALBUTEROL 0.5-2.5 (3) MG/3ML IN SOLN
3.0000 mL | Freq: Two times a day (BID) | RESPIRATORY_TRACT | Status: DC
  Administered 2022-04-15 – 2022-04-18 (×6): 3 mL via RESPIRATORY_TRACT
  Filled 2022-04-15 (×6): qty 3

## 2022-04-15 NOTE — Progress Notes (Signed)
ANTICOAGULATION CONSULT NOTE - Initial Consult  Pharmacy Consult for warfarin Indication: atrial fibrillation  No Known Allergies  Patient Measurements:    Vital Signs: Temp: 98.2 F (36.8 C) (12/04 0500) Temp Source: Oral (12/04 0500) BP: 127/85 (12/04 0500) Pulse Rate: 76 (12/04 0012)  Labs: Recent Labs    04/13/22 0940 04/13/22 1132 04/13/22 1218 04/14/22 0400 04/15/22 0401  HGB 12.5*  --  11.2* 9.5* 9.6*  HCT 38.2*  --  33.0* 30.3* 30.2*  PLT 262  --   --  172 174  APTT 40*  --   --   --   --   LABPROT 23.1*  --   --  31.5* 27.4*  INR 2.1*  --   --  3.1* 2.6*  CREATININE  --  0.85  --  0.74 0.84     CrCl cannot be calculated (Unknown ideal weight.).   Medical History: Past Medical History:  Diagnosis Date   Basal cell carcinoma 07/25/2015   L zygoma inf to lat canthus   Basal cell carcinoma 06/11/2018   L nasal ala   Basal cell carcinoma 10/04/2019   Left nose. Infiltrative pattern.   BPH (benign prostatic hyperplasia)    COPD (chronic obstructive pulmonary disease) (HCC)    Elevated liver enzymes    Emphysema of lung (HCC)    Gastric ulcer    GERD (gastroesophageal reflux disease)    Hepatitis B    Hypertension    Parkinson's disease    Prostate hypertrophy    Rotator cuff tear right   UTI (urinary tract infection)     Medications:  Medications Prior to Admission  Medication Sig Dispense Refill Last Dose   albuterol (VENTOLIN HFA) 108 (90 Base) MCG/ACT inhaler Inhale 2 puffs into the lungs every 6 (six) hours as needed for wheezing or shortness of breath. 18 g 1 unknown   ALPRAZolam (XANAX) 0.5 MG tablet Take 0.5 mg by mouth at bedtime as needed for anxiety or sleep.   unknown   amiodarone (PACERONE) 200 MG tablet Take 100 mg by mouth daily.   04/12/2022   budesonide-formoterol (SYMBICORT) 160-4.5 MCG/ACT inhaler Inhale 2 puffs into the lungs 2 (two) times daily. 10.2 g 3 04/12/2022   carbidopa-levodopa (SINEMET IR) 25-100 MG tablet Take 2  tablets by mouth 5 (five) times daily. Takes at 0800, 1200, 1600, 2000, and 00:00 (midnight)   04/12/2022   Cyanocobalamin ([employer] VITAMIN B-12) 2000 MCG TBCR Take 2,000 mcg by mouth daily.   04/12/2022   ferrous gluconate (FERGON) 324 MG tablet Take 324 mg by mouth daily with breakfast.   04/12/2022   ipratropium-albuterol (DUONEB) 0.5-2.5 (3) MG/3ML SOLN Take 3 mLs by nebulization every 2 (two) hours as needed (ehzzeing / shortness of breath). 360 mL 0 unknown   metoprolol tartrate (LOPRESSOR) 25 MG tablet Take 1 tablet (25 mg total) by mouth 2 (two) times daily. 60 tablet 0 04/12/2022 at 2200   montelukast (SINGULAIR) 10 MG tablet Take 10 mg by mouth at bedtime.   04/12/2022   omeprazole (PRILOSEC) 40 MG capsule Take 40 mg by mouth daily.    04/12/2022   QUEtiapine (SEROQUEL) 50 MG tablet Take 50 mg by mouth at bedtime.   Past Month   roflumilast (DALIRESP) 500 MCG TABS tablet Take 500 mcg by mouth daily.   04/12/2022   SUMAtriptan (IMITREX) 100 MG tablet Take 100 mg by mouth every 2 (two) hours as needed for migraine.   unknown   warfarin (COUMADIN) 5 MG tablet  Take 2.5 mg by mouth daily. Take 1/2 tablet (2.'5mg'$ ) every night   04/12/2022 at 2200   doxycycline (VIBRAMYCIN) 100 MG capsule Take 100 mg by mouth 2 (two) times daily.   hasn't started    Assessment: 81 yo M BIB EMS to ED for respiratory distress 2/2 CAP diagnosed by PCP on 04/12/22. Pt started on IV ceftriaxone and azithromycin and IV fluid resuscitation per sepsis protocol on 04/13/22 in ED. Pt is a hospice patient. Pt has a PMH of esophageal dysphagia on mechanical soft/dysphagia 3 diet, COPD, chronic HFpEF, chronic hypoxic respiratory failure on 2L O2 at home, GERD, HTN and PAF (on warfarin). Pharmacy was consulted to dose and manage warfarin for Afib on admission.   Pt reports taking warfarin '5mg'$  tablets - taking 1/2 tablet, so 2.'5mg'$  daily at ~21:00-22:00 daily. Last dose 2.'5mg'$  on 12/1 ~22:00.   Today, 04/15/22: - INR therapeutic at 2.6 (down  from 3.1 yesterday - received reduced dose warfarin) DDI: - Noted that patient is receiving broad spectrum antibiotics (ceftriaxone, doxycycline, and metronidazole) which can potentially lead to increased INR.  - Amiodarone can lead to increased INR, however patient has been maintained on this regimen outpatient.   Goal of Therapy:  INR 2-3 Monitor platelets by anticoagulation protocol: Yes   Plan:  Warfarin 2.5 PO x1 tonight Monitor daily CBC, INR, and for s/sx of bleeding  Dimple Nanas, PharmD, BCPS 04/15/2022 8:33 AM  Please refer to AMION for pharmacy phone number

## 2022-04-15 NOTE — Progress Notes (Addendum)
PROGRESS NOTE    Patrick Montoya  PPI:[id number] DOB: 1940-11-03 DOA: 04/13/2022 PCP: Tracie Harrier, MD   Brief Narrative: 81 year old with past medical history significant for recurrent multifocal pneumonia in the last 2 years, esophageal dysphagia on mechanical soft diet dysphagia 3, COPD Gold stage III, chronic heart failure preserved ejection fraction, chronic hypoxic respiratory failure on 2 L of oxygen, peptic ulcer disease on PPI, hypertension, PAF on Coumadin presented with worsening productive cough, wheezing and shortness of breath.  Patient recently saw [place] pulmonologist 3 days prior to admission, repeat chest x-ray showed multifocal infiltrate, patient was started on doxycycline twice a day.  Presented with worsening symptoms, febrile, required BiPAP. Now off BIPAP on 6 L oxygen. Treating for Multifocal PNA>    Assessment & Plan:   Principal Problem:   PNA (pneumonia) Active Problems:   Acute on chronic respiratory failure with hypoxia (HCC)   Bacterial pneumonia   Acute bronchitis with chronic obstructive pulmonary disease (COPD) ([place])   1-Sepsis secondary to pneumonia: Present on admission. Patient presented with fever, hypoxia, tachycardia, lactic acidosis, chest x-ray finding concerning for pneumonia. -Change IV antibiotics to Zosyn to cover for Pseudomonas. , continue with doxycycline -Blood cultures: No growth to date.  -Sputum culture ordered:   2-Multifocal pneumonia: Recurrent Acute on chronic Hypoxic respiratory Failure. 2 L oxygen at home.  -Chest x-ray: Interstitial edema versus interstitial lung disease and alveolar consolidation right mild lower lobe, likely pneumonia. -CT chest: Patchy mild and lower lung zone groundglass with area of nodular consolidation in the right upper and right lower lobes.  Consistent with multilobar pneumonia.  Aspiration not excluded.  Small bilateral pleural effusion. -Change ceftriaxone to Zosyn to cover for Pseudomonas,  continue with doxycycline. -He agreed to have a speech therapy evaluation -will give 40 mg IV lasix. Reassesses tomorrow.  -Flutter Valve   3-COPD exacerbation: As scheduled DuoNeb change, Symbicort to Pulmicort and Brovana. Continue with Prednisone.   4-Paroxysmal A-fib Continue Coumadin, continue amiodarone  Chronic heart failure preserved ejection fraction: Continue with metoprolol Monitor volume  Parkinson's disease: Continue with Sinemet  History of peptic ulcer disease: Continue with PPI   Estimated body mass index is 24.97 kg/m as calculated from the following:   Height as of 02/23/22: '5\' 9"'$  (1.753 m).   Weight as of 02/22/22: 76.7 kg.   DVT prophylaxis: Coumadin  Code Status: DNR Family Communication: Disposition Plan:  Status is: Inpatient Remains inpatient appropriate because: remain inpatient.     Consultants:  None  Procedures:    Antimicrobials:    Subjective: He is not feeling well, continue to have productive cough, SOB. He agrees to have speech swallow evaluation.   Objective: Vitals:   04/14/22 2023 04/14/22 2222 04/15/22 0012 04/15/22 0500  BP: 139/89 136/71 121/69 127/85  Pulse: 89 88 76   Resp: (!) 26     Temp: 98 F (36.7 C) 98 F (36.7 C) 98 F (36.7 C) 98.2 F (36.8 C)  TempSrc: Oral Oral Oral Oral  SpO2: 92% 95%      Intake/Output Summary (Last 24 hours) at 04/15/2022 0742 Last data filed at 04/15/2022 0413 Gross per 24 hour  Intake 3112.31 ml  Output 775 ml  Net 2337.31 ml   There were no vitals filed for this visit.  Examination:  General exam: Appears calm and comfortable  Respiratory system: No wheezing.  Cardiovascular system: S1 & S2 heard, RRR.  Gastrointestinal system: Abdomen is nondistended, soft and nontender. No organomegaly or masses felt. Normal bowel  sounds heard. Central nervous system: Alert and oriented. No focal neurological deficits. Extremities: Symmetric 5 x 5 power.    Data Reviewed: I have  personally reviewed following labs and imaging studies  CBC: Recent Labs  Lab 04/13/22 0940 04/13/22 1218 04/14/22 0400 04/15/22 0401  WBC 12.6*  --  14.9* 12.6*  NEUTROABS 9.1*  --   --   --   HGB 12.5* 11.2* 9.5* 9.6*  HCT 38.2* 33.0* 30.3* 30.2*  MCV 103.5*  --  106.3* 105.2*  PLT 262  --  172 026   Basic Metabolic Panel: Recent Labs  Lab 04/13/22 1132 04/13/22 1218 04/14/22 0400 04/15/22 0401  NA 147* 141 144 140  K 3.2* 3.4* 4.1 3.7  CL 113*  --  112* 108  CO2 23  --  26 25  GLUCOSE 126*  --  118* 93  BUN 17  --  16 19  CREATININE 0.85  --  0.74 0.84  CALCIUM 7.1*  --  7.7* 8.4*   GFR: CrCl cannot be calculated (Unknown ideal weight.). Liver Function Tests: No results for input(s): "AST", "ALT", "ALKPHOS", "BILITOT", "PROT", "ALBUMIN" in the last 168 hours. No results for input(s): "LIPASE", "AMYLASE" in the last 168 hours. No results for input(s): "AMMONIA" in the last 168 hours. Coagulation Profile: Recent Labs  Lab 04/13/22 0940 04/14/22 0400 04/15/22 0401  INR 2.1* 3.1* 2.6*   Cardiac Enzymes: No results for input(s): "CKTOTAL", "CKMB", "CKMBINDEX", "TROPONINI" in the last 168 hours. BNP (last 3 results) No results for input(s): "PROBNP" in the last 8760 hours. HbA1C: No results for input(s): "HGBA1C" in the last 72 hours. CBG: No results for input(s): "GLUCAP" in the last 168 hours. Lipid Profile: No results for input(s): "CHOL", "HDL", "LDLCALC", "TRIG", "CHOLHDL", "LDLDIRECT" in the last 72 hours. Thyroid Function Tests: No results for input(s): "TSH", "T4TOTAL", "FREET4", "T3FREE", "THYROIDAB" in the last 72 hours. Anemia Panel: No results for input(s): "VITAMINB12", "FOLATE", "FERRITIN", "TIBC", "IRON", "RETICCTPCT" in the last 72 hours. Sepsis Labs: Recent Labs  Lab 04/13/22 0940 04/13/22 1132 04/14/22 0400 04/15/22 0401  PROCALCITON  --   --  5.68 4.20  LATICACIDVEN 2.7* 1.8  --   --     Recent Results (from the past 240 hour(s))   Urine Culture     Status: Abnormal   Collection Time: 04/13/22  9:46 AM   Specimen: In/Out Cath Urine  Result Value Ref Range Status   Specimen Description IN/OUT CATH URINE  Final   Special Requests   Final    NONE Performed at [facility], [address], [place], [place] [address]    Culture MULTIPLE SPECIES PRESENT, SUGGEST RECOLLECTION (A)  Final   Report Status 04/14/2022 FINAL  Final  Culture, blood (Routine X 2) w Reflex to ID Panel     Status: None (Preliminary result)   Collection Time: 04/13/22 10:06 AM   Specimen: BLOOD  Result Value Ref Range Status   Specimen Description BLOOD RIGHT ANTECUBITAL  Final   Special Requests   Final    BOTTLES DRAWN AEROBIC AND ANAEROBIC Blood Culture adequate volume   Culture   Final    NO GROWTH < 24 HOURS Performed at [facility], [place] [address] [place], [place] [address]    Report Status PENDING  Incomplete  Resp Panel by RT-PCR (Flu A&B, Covid) Anterior Nasal Swab     Status: None   Collection Time: 04/13/22 10:40 AM   Specimen: Anterior Nasal Swab  Result  Value Ref Range Status   SARS Coronavirus 2 by RT PCR NEGATIVE NEGATIVE Final    Comment: (NOTE) SARS-CoV-2 target nucleic acids are NOT DETECTED.  The SARS-CoV-2 RNA is generally detectable in upper respiratory specimens during the acute phase of infection. The lowest concentration of SARS-CoV-2 viral copies this assay can detect is 138 copies/mL. A negative result does not preclude SARS-Cov-2 infection and should not be used as the sole basis for treatment or other patient management decisions. A negative result may occur with  improper specimen collection/handling, submission of specimen other than nasopharyngeal swab, presence of viral mutation(s) within the areas targeted by this assay, and inadequate number of viral copies(<138 copies/mL). A negative result must be combined with clinical observations, patient history, and epidemiological information.  The expected result is Negative.  Fact Sheet for Patients:  EntrepreneurPulse.com.au  Fact Sheet for Healthcare Providers:  IncredibleEmployment.be  This test is no t yet approved or cleared by the [place] FDA and  has been authorized for detection and/or diagnosis of SARS-CoV-2 by FDA under an Emergency Use Authorization (EUA). This EUA will remain  in effect (meaning this test can be used) for the duration of the COVID-19 declaration under Section 564(b)(1) of the Act, 21 U.S.C.section 360bbb-3(b)(1), unless the authorization is terminated  or revoked sooner.       Influenza A by PCR NEGATIVE NEGATIVE Final   Influenza B by PCR NEGATIVE NEGATIVE Final    Comment: (NOTE) The Xpert Xpress SARS-CoV-2/FLU/RSV plus assay is intended as an aid in the diagnosis of influenza from Nasopharyngeal swab specimens and should not be used as a sole basis for treatment. Nasal washings and aspirates are unacceptable for Xpert Xpress SARS-CoV-2/FLU/RSV testing.  Fact Sheet for Patients: EntrepreneurPulse.com.au  Fact Sheet for Healthcare Providers: IncredibleEmployment.be  This test is not yet approved or cleared by the [place] FDA and has been authorized for detection and/or diagnosis of SARS-CoV-2 by FDA under an Emergency Use Authorization (EUA). This EUA will remain in effect (meaning this test can be used) for the duration of the COVID-19 declaration under Section 564(b)(1) of the Act, 21 U.S.C. section 360bbb-3(b)(1), unless the authorization is terminated or revoked.  Performed at [facility], [place] [address] [place], [place] [address]          Radiology Studies: DG Chest Port 1 View  Result Date: 04/13/2022 CLINICAL DATA:  Cough SOB EXAM: PORTABLE CHEST 1 VIEW COMPARISON:  02/22/2022 FINDINGS: Diffuse pulmonary interstitial prominence consistent with pulmonary edema. Alveolar process  mid to lower lung on the right consistent with pneumonia. No pneumothorax identified. Calcified aorta. Unremarkable cardiac silhouette. IMPRESSION: Interstitial edema versus interstitial lung disease and alveolar consolidation right mid to lower lung, likely pneumonia without interval change. Stability over fairly extended period of time suggests possible underlying process which would need to be excluded such as neoplasm. Electronically Signed   By: Sammie Bench M.D.   On: 04/13/2022 10:11        Scheduled Meds:  amiodarone  100 mg Oral Daily   carbidopa-levodopa  2 tablet Oral 5 X Daily   doxycycline  100 mg Oral Q12H   ferrous gluconate  324 mg Oral Q lunch   guaiFENesin  1,200 mg Oral BID   metoprolol tartrate  25 mg Oral BID   mometasone-formoterol  2 puff Inhalation BID   montelukast  10 mg Oral QHS   pantoprazole  40 mg Oral BID AC   predniSONE  40 mg Oral Q breakfast  QUEtiapine  50 mg Oral QHS   Warfarin - Pharmacist Dosing Inpatient   Does not apply q1600   Continuous Infusions:  sodium chloride 10 mL/hr at 04/14/22 1117   cefTRIAXone (ROCEPHIN)  IV Stopped (04/14/22 1148)     LOS: 2 days    Time spent: 35 minutes.     Elmarie Shiley, MD [facility]   If 7PM-7AM, please contact night-coverage [email or web address]  04/15/2022, 7:42 AM

## 2022-04-15 NOTE — Progress Notes (Signed)
   04/14/22 2023 04/14/22 2222 04/15/22 0012  Vitals  Temp 98 F (36.7 C) 98 F (36.7 C) 98 F (36.7 C)  Temp Source Oral Oral Oral  BP 139/89 136/71 121/69  MAP (mmHg) 87 90 85  BP Location Right Arm Right Arm Left Arm  BP Method Automatic Automatic Automatic  Patient Position (if appropriate) Lying Lying Lying  Pulse Rate 89 88 76  Pulse Rate Source Dinamap Monitor Dinamap  ECG Heart Rate 86  --   --   Resp (!) 26  --   --   Level of Consciousness  Level of Consciousness Alert Alert Alert  MEWS COLOR  MEWS Score Color Yellow Yellow Yellow  Oxygen Therapy  SpO2 92 % 95 %  --

## 2022-04-15 NOTE — Progress Notes (Signed)
[facility]) Hospital Liaison Note    Patrick Montoya is a current hospice patient with a terminal diagnosis of COPD. Patient was brought to [facility] ED by his wife due to low 02 sats in the 80's. Patient was evaluated in the [facility] ED and was admitted for pneumonia. Per Dr. Karie Georges, [facility] MD, this is a related hospital admission.   Visited patient at bedside, MD present at the time of visit. Patient was lying in bed alert and oriented. Conversant and aware of his current situation. He agrees that in the past few years as he eats or drinks, he becomes choked more easily. He agreed to trying the speech swallow eval. He also requested a chaplain during my presence. Overall he states he is feeling better, but worried about his future.   Patient is inpatient appropriate due to the treatment of pneumonia, receiving IV antibiotics.  VS:  98.5, 155/93, 22, 94% HFNC 5-6L I&O: 0/200 Labs:  Latest Reference Range & Units 04/15/22 44:81  BASIC METABOLIC PANEL  Rpt !  Sodium 135 - 145 mmol/L 140  Potassium 3.5 - 5.1 mmol/L 3.7  Chloride 98 - 111 mmol/L 108  CO2 22 - 32 mmol/L 25  Glucose 70 - 99 mg/dL 93  BUN 8 - 23 mg/dL 19  Creatinine 0.61 - 1.24 mg/dL 0.84  Calcium 8.9 - 10.3 mg/dL 8.4 (L)  Anion gap 5 - 15  7  GFR, Estimated >60 mL/min >60  Procalcitonin ng/mL 4.20  WBC 4.0 - 10.5 K/uL 12.6 (H)  RBC 4.22 - 5.81 MIL/uL 2.87 (L)  Hemoglobin 13.0 - 17.0 g/dL 9.6 (L)  HCT 39.0 - 52.0 % 30.2 (L)  MCV 80.0 - 100.0 fL 105.2 (H)  MCH 26.0 - 34.0 pg 33.4  MCHC 30.0 - 36.0 g/dL 31.8  RDW 11.5 - 15.5 % 15.1  Platelets 150 - 400 K/uL 174  nRBC 0.0 - 0.2 % 0.0  Prothrombin Time 11.4 - 15.2 seconds 27.4 (H)  INR 0.8 - 1.2  2.6 (H)  !: Data is abnormal (L): Data is abnormally low (H): Data is abnormally high  Diagnostics: none completed today IV/PRN: cefTRIAXone (ROCEPHIN) 2 g in sodium chloride 0.9 % 100 mL IVPB Dose: 2 g Freq: Every 24 hours Route: IV -Last dose 1007am     0.9 % sodium chloride infusion Rate: 10 mL/hr Freq: Continuous Route: IV Start: 04/13/22 0945   metroNIDAZOLE (FLAGYL) IVPB 500 mg Dose: 500 mg Freq: 2 times daily Route: IV Last Dose: 500 mg (04/15/22 0839 -last dose 0839, 12-4  piperacillin-tazobactam (ZOSYN) IVPB 3.375 g Dose: 3.375 g Freq: Every 8 hours Route: IV -Due tonight at 2200  SUMAtriptan (IMITREX) tablet 25 mg Dose: 25 mg Freq: Every 2 hours PRN Route: PO  -given at 0959am 12-4  Problem list: Sepsis -Presented with fever, hypoxemia, tachycardia, lactic acid elevation, chest x-ray confirmed multifocal pneumonia -Continue ceftriaxone and doxycycline -Follow blood culture results -Patient declined evaluation by speech therapy as he was seen by speech therapy less than 2 months ago   Multifocal pneumonia, recurrent -Chest x-ray obtained shows interstitial edema versus interstitial lung disease and irregular consolidation, no interval change.  Due to stability of lesion over fairly extensive enough time suggest possible underlying process like neoplasm which is to be ruled out. -We will obtain high-resolution CT chest to rule out interstitial lung disease/neoplasm -Continue ceftriaxone and doxycycline as above   COPD exacerbation -Started on steroids with prednisone -DuoNeb every 6 hours as needed  Acute on chronic hypoxemic respiratory failure -Management as above   Paroxysmal atrial fibrillation -Patient will be out of A-fib -Continue anticoagulation with warfarin -Continue low-dose amiodarone   Chronic HFpEF -Stable euvolemic -Continue metoprolol   Parkinson's disease -Fairly well controlled -Continue Sinemet   History of peptic ulcer -Continue PPI -H. pylori stool antigen  GOC: Patient is a DNR.     D/C planning: Ongoing.     Family: Called wife, [name] via telephone to provide an update.   IDT: Updated  Upon discharge please use [facility] as they are contracted with our current hospice  patients.  Clementeen Hoof, [facility] Liaison [phone]

## 2022-04-15 NOTE — Progress Notes (Addendum)
Pharmacy Antibiotic Note  Patrick Montoya is a 81 y.o. male admitted on 04/13/2022 with recurrent multifocal pneumonia. Was started on ceftriaxone, doxycycline, and metronidazole. High-resolution chest CT 12/4 showed consolidation in the right upper and lower lobes. Pharmacy has been consulted for Zosyn dosing for HCAP.   Plan: Zosyn 3.375g IV q8 hours (extended infusion over 4 hours) to begin this PM Continue doxycycline per MD through 12/6 Monitor clinical improvement, ability to narrow antibiotics     Temp (24hrs), Avg:98 F (36.7 C), Min:97.6 F (36.4 C), Max:98.5 F (36.9 C)  Recent Labs  Lab 04/13/22 0940 04/13/22 1132 04/14/22 0400 04/15/22 0401  WBC 12.6*  --  14.9* 12.6*  CREATININE  --  0.85 0.74 0.84  LATICACIDVEN 2.7* 1.8  --   --     CrCl cannot be calculated (Unknown ideal weight.).    No Known Allergies  Antimicrobials this admission: Ceftriaxone 12/2 >> 12/4 Doxycycline 12/3 >> 12/6 Azith x1 12/2 Flagyl 12/4 >> 12/4 Zosyn 12/4 >>  Dose adjustments this admission:  Microbiology results: 12/2 BCx: ngtd 12/2 UCx: mult species  12/2 COVID/influenza: negative  Thank you for allowing pharmacy to be a part of this patient's care.  Dimple Nanas, PharmD, BCPS 04/15/2022 10:23 AM

## 2022-04-16 ENCOUNTER — Inpatient Hospital Stay (HOSPITAL_COMMUNITY)

## 2022-04-16 DIAGNOSIS — J189 Pneumonia, unspecified organism: Secondary | ICD-10-CM | POA: Diagnosis not present

## 2022-04-16 LAB — CBC
HCT: 32.7 % — ABNORMAL LOW (ref 39.0–52.0)
Hemoglobin: 10.9 g/dL — ABNORMAL LOW (ref 13.0–17.0)
MCH: 33.9 pg (ref 26.0–34.0)
MCHC: 33.3 g/dL (ref 30.0–36.0)
MCV: 101.6 fL — ABNORMAL HIGH (ref 80.0–100.0)
Platelets: 221 10*3/uL (ref 150–400)
RBC: 3.22 MIL/uL — ABNORMAL LOW (ref 4.22–5.81)
RDW: 14.7 % (ref 11.5–15.5)
WBC: 13.4 10*3/uL — ABNORMAL HIGH (ref 4.0–10.5)
nRBC: 0 % (ref 0.0–0.2)

## 2022-04-16 LAB — PROTIME-INR
INR: 2.5 — ABNORMAL HIGH (ref 0.8–1.2)
Prothrombin Time: 26.4 seconds — ABNORMAL HIGH (ref 11.4–15.2)

## 2022-04-16 MED ORDER — WARFARIN SODIUM 2.5 MG PO TABS
2.5000 mg | ORAL_TABLET | Freq: Once | ORAL | Status: AC
  Administered 2022-04-16: 2.5 mg via ORAL
  Filled 2022-04-16: qty 1

## 2022-04-16 NOTE — Progress Notes (Signed)
PROGRESS NOTE    Patrick Montoya  SEG:[id number] DOB: Aug 08, 1940 DOA: 04/13/2022 PCP: Tracie Harrier, MD   Brief Narrative: 81 year old with past medical history significant for recurrent multifocal pneumonia in the last 2 years, esophageal dysphagia on mechanical soft diet dysphagia 3, COPD Gold stage III, chronic heart failure preserved ejection fraction, chronic hypoxic respiratory failure on 2 L of oxygen, peptic ulcer disease on PPI, hypertension, PAF on Coumadin presented with worsening productive cough, wheezing and shortness of breath.  Patient recently saw [place] pulmonologist 3 days prior to admission, repeat chest x-ray showed multifocal infiltrate, patient was started on doxycycline twice a day.  Presented with worsening symptoms, febrile, required BiPAP. Now off BIPAP on 3 L oxygen. Treating for Multifocal PNA>   Plan to continue IV antibiotics, prednisone, follow sputum culture. Needs MBS.   Assessment & Plan:   Principal Problem:   PNA (pneumonia) Active Problems:   Acute on chronic respiratory failure with hypoxia (HCC)   Bacterial pneumonia   Acute bronchitis with chronic obstructive pulmonary disease (COPD) ([place])   1-Sepsis secondary to pneumonia: Present on admission. Patient presented with fever, hypoxia, tachycardia, lactic acidosis, chest x-ray finding concerning for pneumonia. -Change IV antibiotics to Zosyn to cover for Pseudomonas. , continue with doxycycline. -Blood cultures: No growth to date.  -Sputum culture ordered: Few gram Positive cocci.    2-Multifocal pneumonia: Recurrent Acute on chronic Hypoxic respiratory Failure. 2 L oxygen at home.  -Chest x-ray: Interstitial edema versus interstitial lung disease and alveolar consolidation right mild lower lobe, likely pneumonia. -CT chest: Patchy mild and lower lung zone groundglass with area of nodular consolidation in the right upper and right lower lobes.  Consistent with multilobar pneumonia.  Aspiration  not excluded.  Small bilateral pleural effusion. -Change ceftriaxone to Zosyn to cover for Pseudomonas, continue with doxycycline. -Plan for MBS  12/06 -received one dose IV lasix 12/04. -Flutter Valve  Oxygen requirement down to 3 L.   3-COPD exacerbation: As scheduled DuoNeb change, Symbicort to Pulmicort and Brovana. Continue with Prednisone.   4-Paroxysmal A-fib Continue Coumadin, continue amiodarone  Chronic heart failure preserved ejection fraction: Continue with metoprolol Monitor volume  Parkinson's disease: Continue with Sinemet  History of peptic ulcer disease: Continue with PPI   Estimated body mass index is 24.61 kg/m as calculated from the following:   Height as of this encounter: '5\' 9"'$  (1.753 m).   Weight as of this encounter: 75.6 kg.   DVT prophylaxis: Coumadin  Code Status: DNR Family Communication: care discussed with patient.  Disposition Plan:  Status is: Inpatient Remains inpatient appropriate because: remain inpatient.     Consultants:  None  Procedures:    Antimicrobials:    Subjective: He relates after last hospitalization, he was treated by his pulmonologist with two course of antibiotic and prednisone.  He is feeling better today. He is breathing better., cough improved, secreation less thick.   Objective: Vitals:   04/15/22 2136 04/16/22 0508 04/16/22 0748 04/16/22 0809  BP:  (!) 108/48 132/78   Pulse:   70   Resp:  20    Temp:  98 F (36.7 C) 98.7 F (37.1 C)   TempSrc:  Oral Oral   SpO2: 98% 100% 93% 90%  Weight:      Height:        Intake/Output Summary (Last 24 hours) at 04/16/2022 1420 Last data filed at 04/16/2022 0625 Gross per 24 hour  Intake 437.21 ml  Output 3175 ml  Net -2737.79 ml  Filed Weights   04/15/22 1100  Weight: 75.6 kg    Examination:  General exam: NAD Respiratory system: BL air movement no wheezing.  Cardiovascular system: S 1, S 2 RRR Gastrointestinal system: BS present, soft,  nt Central nervous system: alert, follows command Extremities: no edema.     Data Reviewed: I have personally reviewed following labs and imaging studies  CBC: Recent Labs  Lab 04/13/22 0940 04/13/22 1218 04/14/22 0400 04/15/22 0401 04/16/22 0413  WBC 12.6*  --  14.9* 12.6* 13.4*  NEUTROABS 9.1*  --   --   --   --   HGB 12.5* 11.2* 9.5* 9.6* 10.9*  HCT 38.2* 33.0* 30.3* 30.2* 32.7*  MCV 103.5*  --  106.3* 105.2* 101.6*  PLT 262  --  172 174 409    Basic Metabolic Panel: Recent Labs  Lab 04/13/22 1132 04/13/22 1218 04/14/22 0400 04/15/22 0401  NA 147* 141 144 140  K 3.2* 3.4* 4.1 3.7  CL 113*  --  112* 108  CO2 23  --  26 25  GLUCOSE 126*  --  118* 93  BUN 17  --  16 19  CREATININE 0.85  --  0.74 0.84  CALCIUM 7.1*  --  7.7* 8.4*    GFR: Estimated Creatinine Clearance: 67.8 mL/min (by C-G formula based on SCr of 0.84 mg/dL). Liver Function Tests: No results for input(s): "AST", "ALT", "ALKPHOS", "BILITOT", "PROT", "ALBUMIN" in the last 168 hours. No results for input(s): "LIPASE", "AMYLASE" in the last 168 hours. No results for input(s): "AMMONIA" in the last 168 hours. Coagulation Profile: Recent Labs  Lab 04/13/22 0940 04/14/22 0400 04/15/22 0401 04/16/22 0413  INR 2.1* 3.1* 2.6* 2.5*    Cardiac Enzymes: No results for input(s): "CKTOTAL", "CKMB", "CKMBINDEX", "TROPONINI" in the last 168 hours. BNP (last 3 results) No results for input(s): "PROBNP" in the last 8760 hours. HbA1C: No results for input(s): "HGBA1C" in the last 72 hours. CBG: No results for input(s): "GLUCAP" in the last 168 hours. Lipid Profile: No results for input(s): "CHOL", "HDL", "LDLCALC", "TRIG", "CHOLHDL", "LDLDIRECT" in the last 72 hours. Thyroid Function Tests: No results for input(s): "TSH", "T4TOTAL", "FREET4", "T3FREE", "THYROIDAB" in the last 72 hours. Anemia Panel: No results for input(s): "VITAMINB12", "FOLATE", "FERRITIN", "TIBC", "IRON", "RETICCTPCT" in the last 72  hours. Sepsis Labs: Recent Labs  Lab 04/13/22 0940 04/13/22 1132 04/14/22 0400 04/15/22 0401  PROCALCITON  --   --  5.68 4.20  LATICACIDVEN 2.7* 1.8  --   --      Recent Results (from the past 240 hour(s))  Urine Culture     Status: Abnormal   Collection Time: 04/13/22  9:46 AM   Specimen: In/Out Cath Urine  Result Value Ref Range Status   Specimen Description IN/OUT CATH URINE  Final   Special Requests   Final    NONE Performed at [facility], [address] [place], [place] [address]    Culture MULTIPLE SPECIES PRESENT, SUGGEST RECOLLECTION (A)  Final   Report Status 04/14/2022 FINAL  Final  Culture, blood (Routine X 2) w Reflex to ID Panel     Status: None (Preliminary result)   Collection Time: 04/13/22 10:06 AM   Specimen: BLOOD  Result Value Ref Range Status   Specimen Description BLOOD RIGHT ANTECUBITAL  Final   Special Requests   Final    BOTTLES DRAWN AEROBIC AND ANAEROBIC Blood Culture adequate volume   Culture   Final    NO GROWTH 3  DAYS Performed at [facility], [place] [address] [place], [place] [address]    Report Status PENDING  Incomplete  Resp Panel by RT-PCR (Flu A&B, Covid) Anterior Nasal Swab     Status: None   Collection Time: 04/13/22 10:40 AM   Specimen: Anterior Nasal Swab  Result Value Ref Range Status   SARS Coronavirus 2 by RT PCR NEGATIVE NEGATIVE Final    Comment: (NOTE) SARS-CoV-2 target nucleic acids are NOT DETECTED.  The SARS-CoV-2 RNA is generally detectable in upper respiratory specimens during the acute phase of infection. The lowest concentration of SARS-CoV-2 viral copies this assay can detect is 138 copies/mL. A negative result does not preclude SARS-Cov-2 infection and should not be used as the sole basis for treatment or other patient management decisions. A negative result may occur with  improper specimen collection/handling, submission of specimen other than nasopharyngeal swab, presence of viral mutation(s)  within the areas targeted by this assay, and inadequate number of viral copies(<138 copies/mL). A negative result must be combined with clinical observations, patient history, and epidemiological information. The expected result is Negative.  Fact Sheet for Patients:  EntrepreneurPulse.com.au  Fact Sheet for Healthcare Providers:  IncredibleEmployment.be  This test is no t yet approved or cleared by the [place] FDA and  has been authorized for detection and/or diagnosis of SARS-CoV-2 by FDA under an Emergency Use Authorization (EUA). This EUA will remain  in effect (meaning this test can be used) for the duration of the COVID-19 declaration under Section 564(b)(1) of the Act, 21 U.S.C.section 360bbb-3(b)(1), unless the authorization is terminated  or revoked sooner.       Influenza A by PCR NEGATIVE NEGATIVE Final   Influenza B by PCR NEGATIVE NEGATIVE Final    Comment: (NOTE) The Xpert Xpress SARS-CoV-2/FLU/RSV plus assay is intended as an aid in the diagnosis of influenza from Nasopharyngeal swab specimens and should not be used as a sole basis for treatment. Nasal washings and aspirates are unacceptable for Xpert Xpress SARS-CoV-2/FLU/RSV testing.  Fact Sheet for Patients: EntrepreneurPulse.com.au  Fact Sheet for Healthcare Providers: IncredibleEmployment.be  This test is not yet approved or cleared by the [place] FDA and has been authorized for detection and/or diagnosis of SARS-CoV-2 by FDA under an Emergency Use Authorization (EUA). This EUA will remain in effect (meaning this test can be used) for the duration of the COVID-19 declaration under Section 564(b)(1) of the Act, 21 U.S.C. section 360bbb-3(b)(1), unless the authorization is terminated or revoked.  Performed at [facility], [place] [address] [place], [place] [address]   Expectorated Sputum Assessment w Gram Stain,  Rflx to Resp Cult     Status: None   Collection Time: 04/15/22  2:50 PM   Specimen: Sputum  Result Value Ref Range Status   Specimen Description SPU  Final   Special Requests Immunocompromised  Final   Sputum evaluation   Final    THIS SPECIMEN IS ACCEPTABLE FOR SPUTUM CULTURE Performed at [facility], [address], [place], [place] [address]    Report Status 04/15/2022 FINAL  Final  Culture, Respiratory w Gram Stain     Status: None (Preliminary result)   Collection Time: 04/15/22  2:50 PM   Specimen: Sputum  Result Value Ref Range Status   Specimen Description SPU  Final   Special Requests Immunocompromised Reflexed from [id number]  Final   Gram Stain   Final    RARE WBC PRESENT, PREDOMINANTLY PMN FEW GRAM POSITIVE COCCI  Culture   Final    TOO YOUNG TO READ Performed at [facility], [place] [address], [place], [place] [address]    Report Status PENDING  Incomplete         Radiology Studies: CT Chest High Resolution  Result Date: 04/15/2022 CLINICAL DATA:  Hypoxemia. EXAM: CT CHEST WITHOUT CONTRAST TECHNIQUE: Multidetector CT imaging of the chest was performed following the standard protocol without intravenous contrast. High resolution imaging of the lungs, as well as inspiratory and expiratory imaging, was performed. RADIATION DOSE REDUCTION: This exam was performed according to the departmental dose-optimization program which includes automated exposure control, adjustment of the mA and/or kV according to patient size and/or use of iterative reconstruction technique. COMPARISON:  02/20/2022, 08/01/2021 and 02/26/2021. FINDINGS: Cardiovascular: Atherosclerotic calcification of the aorta, aortic valve and coronary arteries. Enlarged pulmonic trunk and heart. Small pericardial effusion. Mediastinum/Nodes: Low right paratracheal lymph node measures 17 mm, similar to 02/26/2021. Hilar regions are difficult to evaluate without IV contrast. Subcarinal lymph node measures  1.7 cm, also similar. Axillary lymph nodes are not enlarged by CT size criteria. Esophagus is partially air-filled, which can be seen with dysmotility. Lungs/Pleura: Severe centrilobular and paraseptal emphysema. Image quality is degraded by respiratory motion, especially in the mid and lower lung zones, where there is patchy ground-glass. Areas of nodular consolidation in the right upper and right lower lobes appear new from 02/20/2022. 5 mm right upper lobe nodule (10/86), stable from 02/26/2021. Per Fleischner Society guidelines, no follow-up is necessary. Small bilateral pleural effusions. Dependent atelectasis in the left lower lobe. Findings make assessment for underlying interstitial lung disease difficult. Debris is seen in the airway. Upper Abdomen: Visualized portions of the liver and right adrenal gland are unremarkable. 1.9 cm left adrenal nodule measures 3 Hounsfield units. No follow-up necessary. Subcentimeter low-attenuation lesion in the right kidney, too small to characterize. No specific follow-up necessary. Visualized portions of the kidneys, spleen, pancreas, stomach and bowel are otherwise unremarkable with the exception of a small hiatal hernia. Cholecystectomy. No upper abdominal adenopathy. Musculoskeletal: Degenerative changes in the spine. No worrisome lytic or sclerotic lesions. Old sternal fracture. IMPRESSION: 1. Patchy mid and lower lung zone ground-glass with areas of nodular consolidation in the right upper and right lower lobes. Findings are indicative of multilobar pneumonia. A component of aspiration is not excluded. 2. Acute pulmonary parenchymal findings and respiratory motion preclude evaluation for interstitial lung disease. 3. Small pericardial effusion. 4. Small bilateral pleural effusions. 5. Left adrenal adenoma. 6.  Emphysema (ICD10-J43.9). 7. Aortic atherosclerosis (ICD10-I70.0). Coronary artery calcification. 8. Enlarged pulmonic trunk, indicative of pulmonary arterial  hypertension. Electronically Signed   By: Lorin Picket M.D.   On: 04/15/2022 08:16        Scheduled Meds:  amiodarone  100 mg Oral Daily   arformoterol  15 mcg Nebulization BID   budesonide (PULMICORT) nebulizer solution  0.25 mg Nebulization BID   carbidopa-levodopa  2 tablet Oral 5 X Daily   doxycycline  100 mg Oral Q12H   ferrous gluconate  324 mg Oral Q lunch   fluticasone  1 spray Each Nare Daily   guaiFENesin  1,200 mg Oral BID   ipratropium-albuterol  3 mL Nebulization BID   metoprolol tartrate  25 mg Oral BID   montelukast  10 mg Oral QHS   mouth rinse  15 mL Mouth Rinse 4 times per day   pantoprazole  40 mg Oral BID AC   pneumococcal 20-valent conjugate vaccine  0.5 mL  Intramuscular Tomorrow-1000   predniSONE  40 mg Oral Q breakfast   QUEtiapine  50 mg Oral QHS   warfarin  2.5 mg Oral ONCE-1600   Warfarin - Pharmacist Dosing Inpatient   Does not apply q1600   Continuous Infusions:  sodium chloride Stopped (04/15/22 2204)   piperacillin-tazobactam (ZOSYN)  IV 3.375 g (04/16/22 0627)     LOS: 3 days    Time spent: 35 minutes.     Elmarie Shiley, MD [facility]   If 7PM-7AM, please contact night-coverage [email or web address]  04/16/2022, 2:20 PM

## 2022-04-16 NOTE — Progress Notes (Signed)
Modified Barium Swallow Progress Note  Patient Details  Name: Patrick Montoya MRN: [id number] Date of Birth: [birth year]  Today's Date: 04/16/2022  Modified Barium Swallow completed.  Full report located under Chart Review in the Imaging Section.  Brief recommendations include the following:  Clinical Impression  Pt has a mild oropharyngeal dysphagia largely characterized by impaired timing that allows for penetration and silent aspiration of thin and nectar thick liquids that spill into the airway prior to airway closure. Pt is capable of achieving adequate airway protection at times, but his timing is not consistent and given that he only coughed x1 during MBS, it would be hard to monitor clinically. Pt had improved containment above the valleculae and out of the laryngeal vestibule with thin liquids when using a chin tuck. This strategy, if it can be used consistently, seems to be more reliable than use of thickener given silent aspiration of nectar thick liquids as well. Education was provided about rationale for chin tuck, how to implement it, and how to account for mixed consistencies given that pt loves to eat milk and cereal but thin liquids via spoon were also aspirated. Pt was open to trying these strategies and agreeable to ongoing SLP f/u.   Swallow Evaluation Recommendations       SLP Diet Recommendations: Dysphagia 3 (Mech soft) solids;Thin liquid   Liquid Administration via: Straw   Medication Administration: Whole meds with puree   Supervision: Patient able to self feed;Intermittent supervision to cue for compensatory strategies   Compensations: Slow rate;Small sips/bites;Follow solids with liquid;Chin tuck;Use straw to facilitate chin tuck;Other (Comment) (avoid mixed consistencies)   Postural Changes: Seated upright at 90 degrees;Remain semi-upright after after feeds/meals (Comment)   Oral Care Recommendations: Oral care BID        Osie Bond., M.A. [place] Office [phone]  Secure chat preferred  04/16/2022,3:30 PM

## 2022-04-16 NOTE — Progress Notes (Addendum)
[facility]) Hospital Liaison Note    [name] is a current hospice patient with a terminal diagnosis of COPD. Patient was brought to [facility] ED by his wife due to low 02 sats in the 80's. Patient was evaluated in the [facility] ED and was admitted for pneumonia. Per Dr. Karie Georges, [facility] MD, this is a related hospital admission.    Visted [name] at bedside. Patient is alert and conversant. No family present at bedside. No reports of pain or discomfort. He is currently receiving IV antibiotics and is currently on 5L of 02. Antibitotic was changed from Rocephin to Zosyn today. Called wife, Patrick Montoya via telephone to provide an update. Left a message. Report exchanged with RN. Patient requested to have a visit with his home team chaplain if possible.    Patient is inpatient appropriate due to receiving IV antibiotics for treatment of pneumonia.    V/S: 132/78 bp, 98.7 Temp, 70 bpm, 20 RR, 93% on 5L/min  I/O: 437.06/3373  Labs:  WBC: 13.4 (H) RBC: 3.22 (L) Hemoglobin: 10.9 (L) HCT: 32.7 (L) MCV: 101.6 (H) Prothrombin Time: 26.4 (H) INR: 2.5 (H)  Diagnostics:  None today.   IV/PRN:  piperacillin-tazobactam (ZOSYN) IVPB 3.375 g Dose: 3.375 g Freq: Every 8 hours Route: IV x4  SUMAtriptan (IMITREX) tablet 25 mg Dose: 25 mg Freq: Every 2 hours PRN Route: PO x1    Problem list: Assessment & Plan:   Principal Problem:   PNA (pneumonia) Active Problems:   Acute on chronic respiratory failure with hypoxia (HCC)   Bacterial pneumonia   Acute bronchitis with chronic obstructive pulmonary disease (COPD) ([place])     1-Sepsis secondary to pneumonia: Present on admission. Patient presented with fever, hypoxia, tachycardia, lactic acidosis, chest x-ray finding concerning for pneumonia. -Change IV antibiotics to Zosyn to cover for Pseudomonas. , continue with doxycycline. -Blood cultures: No growth to date.  -Sputum culture ordered: Few gram Positive cocci.       2-Multifocal pneumonia: Recurrent Acute on chronic Hypoxic respiratory Failure. 2 L oxygen at home.  -Chest x-ray: Interstitial edema versus interstitial lung disease and alveolar consolidation right mild lower lobe, likely pneumonia. -CT chest: Patchy mild and lower lung zone groundglass with area of nodular consolidation in the right upper and right lower lobes.  Consistent with multilobar pneumonia.  Aspiration not excluded.  Small bilateral pleural effusion. -Change ceftriaxone to Zosyn to cover for Pseudomonas, continue with doxycycline. -Plan for MBS  12/06 -received one dose IV lasix 12/04. -Flutter Valve  Oxygen requirement down to 3 L.    3-COPD exacerbation: As scheduled DuoNeb change, Symbicort to Pulmicort and Brovana. Continue with Prednisone.     4-Paroxysmal A-fib Continue Coumadin, continue amiodarone   Chronic heart failure preserved ejection fraction: Continue with metoprolol Monitor volume   Parkinson's disease: Continue with Sinemet   History of peptic ulcer disease: Continue with PPI  GOC: Patient is a DNR.    D/C planning: Ongoing.    Family: Called wife, Patrick Montoya via telephone and left a message.  IDT: Updated   Zigmund Gottron, RN [facility] Liaison  [phone]

## 2022-04-16 NOTE — Progress Notes (Signed)
ANTICOAGULATION CONSULT NOTE - Initial Consult  Pharmacy Consult for warfarin Indication: atrial fibrillation  No Known Allergies  Patient Measurements: Height: '5\' 9"'$  (175.3 cm) Weight: 75.6 kg (166 lb 10.7 oz) IBW/kg (Calculated) : 70.7  Vital Signs: Temp: 98 F (36.7 C) (12/05 0508) Temp Source: Oral (12/05 0508) BP: 108/48 (12/05 0508) Pulse Rate: 68 (12/04 2023)  Labs: Recent Labs    04/13/22 0940 04/13/22 1132 04/13/22 1218 04/14/22 0400 04/15/22 0401 04/16/22 0413  HGB 12.5*  --    < > 9.5* 9.6* 10.9*  HCT 38.2*  --    < > 30.3* 30.2* 32.7*  PLT 262  --   --  172 174 221  APTT 40*  --   --   --   --   --   LABPROT 23.1*  --   --  31.5* 27.4* 26.4*  INR 2.1*  --   --  3.1* 2.6* 2.5*  CREATININE  --  0.85  --  0.74 0.84  --    < > = values in this interval not displayed.     Estimated Creatinine Clearance: 67.8 mL/min (by C-G formula based on SCr of 0.84 mg/dL).   Medical History: Past Medical History:  Diagnosis Date   Basal cell carcinoma 07/25/2015   L zygoma inf to lat canthus   Basal cell carcinoma 06/11/2018   L nasal ala   Basal cell carcinoma 10/04/2019   Left nose. Infiltrative pattern.   BPH (benign prostatic hyperplasia)    COPD (chronic obstructive pulmonary disease) (HCC)    Elevated liver enzymes    Emphysema of lung (HCC)    Gastric ulcer    GERD (gastroesophageal reflux disease)    Hepatitis B    Hypertension    Parkinson's disease    Prostate hypertrophy    Rotator cuff tear right   UTI (urinary tract infection)     Medications:  Medications Prior to Admission  Medication Sig Dispense Refill Last Dose   albuterol (VENTOLIN HFA) 108 (90 Base) MCG/ACT inhaler Inhale 2 puffs into the lungs every 6 (six) hours as needed for wheezing or shortness of breath. 18 g 1 unknown   ALPRAZolam (XANAX) 0.5 MG tablet Take 0.5 mg by mouth at bedtime as needed for anxiety or sleep.   unknown   amiodarone (PACERONE) 200 MG tablet Take 100 mg by  mouth daily.   04/12/2022   budesonide-formoterol (SYMBICORT) 160-4.5 MCG/ACT inhaler Inhale 2 puffs into the lungs 2 (two) times daily. 10.2 g 3 04/12/2022   carbidopa-levodopa (SINEMET IR) 25-100 MG tablet Take 2 tablets by mouth 5 (five) times daily. Takes at 0800, 1200, 1600, 2000, and 00:00 (midnight)   04/12/2022   Cyanocobalamin ([employer] VITAMIN B-12) 2000 MCG TBCR Take 2,000 mcg by mouth daily.   04/12/2022   ferrous gluconate (FERGON) 324 MG tablet Take 324 mg by mouth daily with breakfast.   04/12/2022   ipratropium-albuterol (DUONEB) 0.5-2.5 (3) MG/3ML SOLN Take 3 mLs by nebulization every 2 (two) hours as needed (ehzzeing / shortness of breath). 360 mL 0 unknown   metoprolol tartrate (LOPRESSOR) 25 MG tablet Take 1 tablet (25 mg total) by mouth 2 (two) times daily. 60 tablet 0 04/12/2022 at 2200   montelukast (SINGULAIR) 10 MG tablet Take 10 mg by mouth at bedtime.   04/12/2022   omeprazole (PRILOSEC) 40 MG capsule Take 40 mg by mouth daily.    04/12/2022   QUEtiapine (SEROQUEL) 50 MG tablet Take 50 mg by mouth at bedtime.  Past Month   roflumilast (DALIRESP) 500 MCG TABS tablet Take 500 mcg by mouth daily.   04/12/2022   SUMAtriptan (IMITREX) 100 MG tablet Take 100 mg by mouth every 2 (two) hours as needed for migraine.   unknown   warfarin (COUMADIN) 5 MG tablet Take 2.5 mg by mouth daily. Take 1/2 tablet (2.'5mg'$ ) every night   04/12/2022 at 2200   doxycycline (VIBRAMYCIN) 100 MG capsule Take 100 mg by mouth 2 (two) times daily.   hasn't started    Assessment: 81 yo M BIB EMS to ED for respiratory distress 2/2 CAP diagnosed by PCP on 04/12/22. Pt started on IV ceftriaxone and azithromycin and IV fluid resuscitation per sepsis protocol on 04/13/22 in ED. Pt is a hospice patient. Pt has a PMH of esophageal dysphagia on mechanical soft/dysphagia 3 diet, COPD, chronic HFpEF, chronic hypoxic respiratory failure on 2L O2 at home, GERD, HTN and PAF (on warfarin). Pharmacy was consulted to dose and manage  warfarin for Afib on admission.   Pt reports having warfarin '5mg'$  tablets - taking 1/2 tablet, so 2.'5mg'$  daily at ~21:00-22:00. Last dose PTA 2.'5mg'$  on 12/1 ~22:00.   Today, 04/16/22: - INR therapeutic at 2.5, CBC stable  DDI: - Noted that patient is receiving broad spectrum antibiotics (Zosyn + doxycycline) which can potentially lead to increased INR.  - Amiodarone can lead to increased INR, however patient has been maintained on this regimen outpatient.   Goal of Therapy:  INR 2-3 Monitor platelets by anticoagulation protocol: Yes   Plan:  Warfarin 2.5 PO x1 tonight Monitor daily CBC, INR, and for s/sx of bleeding  Dimple Nanas, PharmD, BCPS 04/16/2022 7:27 AM  Please refer to AMION for pharmacy phone number

## 2022-04-16 NOTE — Evaluation (Signed)
Clinical/Bedside Swallow Evaluation Patient Details  Name: Patrick Montoya MRN: [id number] Date of Birth: 01-06-1941  Today's Date: 04/16/2022 Time: SLP Start Time (ACUTE ONLY): 1028 SLP Stop Time (ACUTE ONLY): 1056 SLP Time Calculation (min) (ACUTE ONLY): 28 min  Past Medical History:  Past Medical History:  Diagnosis Date   Basal cell carcinoma 07/25/2015   L zygoma inf to lat canthus   Basal cell carcinoma 06/11/2018   L nasal ala   Basal cell carcinoma 10/04/2019   Left nose. Infiltrative pattern.   BPH (benign prostatic hyperplasia)    COPD (chronic obstructive pulmonary disease) (HCC)    Elevated liver enzymes    Emphysema of lung (HCC)    Gastric ulcer    GERD (gastroesophageal reflux disease)    Hepatitis B    Hypertension    Parkinson's disease    Prostate hypertrophy    Rotator cuff tear right   UTI (urinary tract infection)    Past Surgical History:  Past Surgical History:  Procedure Laterality Date   CHOLECYSTECTOMY     COLONOSCOPY WITH PROPOFOL N/A 10/20/2015   Procedure: COLONOSCOPY WITH PROPOFOL;  Surgeon: Manya Silvas, MD;  Location: [place];  Service: Endoscopy;  Laterality: N/A;   COLONOSCOPY WITH PROPOFOL N/A 01/24/2021   Procedure: COLONOSCOPY WITH PROPOFOL;  Surgeon: Toledo, Benay Pike, MD;  Location: [facility] ENDOSCOPY;  Service: Gastroenterology;  Laterality: N/A;   ESOPHAGOGASTRODUODENOSCOPY (EGD) WITH PROPOFOL N/A 10/20/2015   Procedure: ESOPHAGOGASTRODUODENOSCOPY (EGD) WITH PROPOFOL;  Surgeon: Manya Silvas, MD;  Location[facility] ENDOSCOPY;  Service: Endoscopy;  Laterality: N/A;   GREEN LIGHT LASER TURP (TRANSURETHRAL RESECTION OF PROSTATE N/A 11/17/2018   Procedure: GREEN LIGHT LASER TURP (TRANSURETHRAL RESECTION OF PROSTATE;  Surgeon: Royston Cowper, MD;  Location: [facility] ORS;  Service: Urology;  Laterality: N/A;   INGUINAL HERNIA REPAIR Right 02/18/2019   Procedure: HERNIA REPAIR INGUINAL ADULT;  Surgeon: Royston Cowper, MD;  Location: [facility] ORS;   Service: Urology;  Laterality: Right;   INSERTION OF MESH Right 02/18/2019   Procedure: INSERTION OF MESH;  Surgeon: Royston Cowper, MD;  Location: [facility] ORS;  Service: Urology;  Laterality: Right;   TONSILLECTOMY     TRANSURETHRAL MICROWAVE THERAPY     HPI:  Pt is an 81 yo male admitted 12/2 with sepsis due to multifocal PNA. Clinical swallow eval in October 2023 recommended Dys 3 diet and thin liquids. No overt oropharyngeal dysphagia noted but did note risk for aspiration in the setting of respiratory and esophageal hx. PMH includes: hospice pt with terminal dx of COPD, recurrent PNA, esophageal dysphagia on mechanical soft diet, small hiatal hernia (per esophagram 2022, which also reported normal oropharyngeal swallow), GERD, Parkinson's disease, basal cell carcinoma (face/nose), CHF, chronic hypoxic respiratory failure on 2L O2, PUD on PPI, HTN, PAF    Assessment / Plan / Recommendation  Clinical Impression  Pt presents with functional appearing oropharyngeal swallowing with no overt s/s of aspiration. Mild R facial droop noted primarily at rest but pt reports that this is his baseline due to scar tissue from surgery for carcinoma. He has a productive cough and uses his yankauer with Mod I to remove secretions. Despite the above, pt does have several risk factors for dysphagia, including h/o Parkinson's disease, respiratory issues, and esophageal issues. Discussed with pt the option of pursuing MBS for more thorough w/u given his recurrent PNAs and potential risk factors for dysphagia related adverse events. Pt is in agreement with MBS. Will leave on current diet until  this can be completed, with pt already demonstrating use of general aspiration and esophageal precautions. SLP Visit Diagnosis: Dysphagia, unspecified (R13.10)    Aspiration Risk  Mild aspiration risk    Diet Recommendation Dysphagia 3 (Mech soft);Thin liquid   Liquid Administration via: Cup;Straw Medication Administration:  Whole meds with liquid Supervision: Patient able to self feed Compensations: Slow rate;Small sips/bites;Follow solids with liquid Postural Changes: Seated upright at 90 degrees;Remain upright for at least 30 minutes after po intake    Other  Recommendations Oral Care Recommendations: Oral care BID    Recommendations for follow up therapy are one component of a multi-disciplinary discharge planning process, led by the attending physician.  Recommendations may be updated based on patient status, additional functional criteria and insurance authorization.  Follow up Recommendations  (tba)      Assistance Recommended at Discharge    Functional Status Assessment    Frequency and Duration            Prognosis        Swallow Study   General HPI: Pt is an 81 yo male admitted 12/2 with sepsis due to multifocal PNA. Clinical swallow eval in October 2023 recommended Dys 3 diet and thin liquids. No overt oropharyngeal dysphagia noted but did note risk for aspiration in the setting of respiratory and esophageal hx. PMH includes: hospice pt with terminal dx of COPD, recurrent PNA, esophageal dysphagia on mechanical soft diet, small hiatal hernia (per esophagram 2022, which also reported normal oropharyngeal swallow), GERD, Parkinson's disease, basal cell carcinoma (face/nose), CHF, chronic hypoxic respiratory failure on 2L O2, PUD on PPI, HTN, PAF Type of Study: Bedside Swallow Evaluation Previous Swallow Assessment: see HPI Diet Prior to this Study: Dysphagia 3 (soft);Thin liquids Temperature Spikes Noted: No Respiratory Status: Nasal cannula History of Recent Intubation: No Behavior/Cognition: Alert;Cooperative;Pleasant mood Oral Cavity Assessment: Within Functional Limits Oral Care Completed by SLP: No Oral Cavity - Dentition: Edentulous;Dentures, not available (pt says his dentures are at home, but he can eat well with or without them) Vision: Functional for self-feeding Self-Feeding  Abilities: Able to feed self Patient Positioning: Upright in bed Baseline Vocal Quality: Normal Volitional Cough: Strong;Congested;Other (Comment) (productive) Volitional Swallow: Able to elicit    Oral/Motor/Sensory Function Overall Oral Motor/Sensory Function: Within functional limits   Ice Chips Ice chips: Not tested   Thin Liquid Thin Liquid: Within functional limits Presentation: Cup;Self Fed;Straw    Nectar Thick Nectar Thick Liquid: Not tested   Honey Thick Honey Thick Liquid: Not tested   Puree Puree: Within functional limits Presentation: Self Fed;Spoon   Solid     Solid: Within functional limits Presentation: Self Fed      Osie Bond., M.A. [place] Office [phone]  Secure chat preferred  04/16/2022,11:07 AM

## 2022-04-16 NOTE — Progress Notes (Signed)
Refused cpap.

## 2022-04-17 DIAGNOSIS — J189 Pneumonia, unspecified organism: Secondary | ICD-10-CM | POA: Diagnosis not present

## 2022-04-17 LAB — CBC
HCT: 37.6 % — ABNORMAL LOW (ref 39.0–52.0)
Hemoglobin: 11.7 g/dL — ABNORMAL LOW (ref 13.0–17.0)
MCH: 32.9 pg (ref 26.0–34.0)
MCHC: 31.1 g/dL (ref 30.0–36.0)
MCV: 105.6 fL — ABNORMAL HIGH (ref 80.0–100.0)
Platelets: 149 10*3/uL — ABNORMAL LOW (ref 150–400)
RBC: 3.56 MIL/uL — ABNORMAL LOW (ref 4.22–5.81)
RDW: 14.6 % (ref 11.5–15.5)
WBC: 12 10*3/uL — ABNORMAL HIGH (ref 4.0–10.5)
nRBC: 0 % (ref 0.0–0.2)

## 2022-04-17 LAB — BASIC METABOLIC PANEL
Anion gap: 5 (ref 5–15)
BUN: 20 mg/dL (ref 8–23)
CO2: 30 mmol/L (ref 22–32)
Calcium: 8.8 mg/dL — ABNORMAL LOW (ref 8.9–10.3)
Chloride: 104 mmol/L (ref 98–111)
Creatinine, Ser: 0.91 mg/dL (ref 0.61–1.24)
GFR, Estimated: >60 mL/min (ref >60)
Glucose, Bld: 114 mg/dL — ABNORMAL HIGH (ref 70–99)
Potassium: 3.7 mmol/L (ref 3.5–5.1)
Sodium: 139 mmol/L (ref 135–145)

## 2022-04-17 LAB — MAGNESIUM: Magnesium: 1.9 mg/dL (ref 1.7–2.4)

## 2022-04-17 LAB — PROTIME-INR
INR: 2.5 — ABNORMAL HIGH (ref 0.8–1.2)
Prothrombin Time: 26.5 seconds — ABNORMAL HIGH (ref 11.4–15.2)

## 2022-04-17 MED ORDER — POLYETHYLENE GLYCOL 3350 17 G PO PACK
17.0000 g | PACK | Freq: Every day | ORAL | Status: DC
  Administered 2022-04-17 – 2022-04-18 (×2): 17 g via ORAL
  Filled 2022-04-17 (×2): qty 1

## 2022-04-17 MED ORDER — WARFARIN SODIUM 2.5 MG PO TABS
2.5000 mg | ORAL_TABLET | Freq: Once | ORAL | Status: AC
  Administered 2022-04-17: 2.5 mg via ORAL
  Filled 2022-04-17: qty 1

## 2022-04-17 MED ORDER — DOCUSATE SODIUM 100 MG PO CAPS
100.0000 mg | ORAL_CAPSULE | Freq: Two times a day (BID) | ORAL | Status: DC
  Administered 2022-04-17 – 2022-04-18 (×3): 100 mg via ORAL
  Filled 2022-04-17 (×3): qty 1

## 2022-04-17 NOTE — Progress Notes (Signed)
Mobility Specialist Progress Note   04/17/22 1615  Mobility  Activity Transferred from bed to chair  Level of Assistance Modified independent, requires aide device or extra time  Assistive Device None  Distance Ambulated (ft) 3 ft  Range of Motion/Exercises Active;All extremities  Activity Response Tolerated well   Patient received in bed eating his lunch, agreed to sit in recliner chair for lunch. Stood and took steps to the recliner independently. Tolerated without complaint or incident. Was left in recliner with all needs met, call bell in reach and RN present.  [name], BS EXP Mobility Specialist Please contact via SecureChat or Rehab office at [phone]

## 2022-04-17 NOTE — Progress Notes (Signed)
ANTICOAGULATION CONSULT NOTE - Initial Consult  Pharmacy Consult for warfarin Indication: atrial fibrillation  No Known Allergies  Patient Measurements: Height: '5\' 9"'$  (175.3 cm) Weight: 75.6 kg (166 lb 10.7 oz) IBW/kg (Calculated) : 70.7  Vital Signs: Temp: 98.3 F (36.8 C) (12/06 0409) Temp Source: Oral (12/06 0409) BP: 110/59 (12/06 0409) Pulse Rate: 66 (12/06 0409)  Labs: Recent Labs    04/15/22 0401 04/16/22 0413 04/17/22 0339 04/17/22 0545  HGB 9.6* 10.9* 11.7*  --   HCT 30.2* 32.7* 37.6*  --   PLT 174 221 149*  --   LABPROT 27.4* 26.4* 26.5*  --   INR 2.6* 2.5* 2.5*  --   CREATININE 0.84  --   --  0.91     Estimated Creatinine Clearance: 62.6 mL/min (by C-G formula based on SCr of 0.91 mg/dL).   Medical History: Past Medical History:  Diagnosis Date   Basal cell carcinoma 07/25/2015   L zygoma inf to lat canthus   Basal cell carcinoma 06/11/2018   L nasal ala   Basal cell carcinoma 10/04/2019   Left nose. Infiltrative pattern.   BPH (benign prostatic hyperplasia)    COPD (chronic obstructive pulmonary disease) (HCC)    Elevated liver enzymes    Emphysema of lung (HCC)    Gastric ulcer    GERD (gastroesophageal reflux disease)    Hepatitis B    Hypertension    Parkinson's disease    Prostate hypertrophy    Rotator cuff tear right   UTI (urinary tract infection)     Medications:  Medications Prior to Admission  Medication Sig Dispense Refill Last Dose   albuterol (VENTOLIN HFA) 108 (90 Base) MCG/ACT inhaler Inhale 2 puffs into the lungs every 6 (six) hours as needed for wheezing or shortness of breath. 18 g 1 unknown   ALPRAZolam (XANAX) 0.5 MG tablet Take 0.5 mg by mouth at bedtime as needed for anxiety or sleep.   unknown   amiodarone (PACERONE) 200 MG tablet Take 100 mg by mouth daily.   04/12/2022   budesonide-formoterol (SYMBICORT) 160-4.5 MCG/ACT inhaler Inhale 2 puffs into the lungs 2 (two) times daily. 10.2 g 3 04/12/2022    carbidopa-levodopa (SINEMET IR) 25-100 MG tablet Take 2 tablets by mouth 5 (five) times daily. Takes at 0800, 1200, 1600, 2000, and 00:00 (midnight)   04/12/2022   Cyanocobalamin ([employer] VITAMIN B-12) 2000 MCG TBCR Take 2,000 mcg by mouth daily.   04/12/2022   ferrous gluconate (FERGON) 324 MG tablet Take 324 mg by mouth daily with breakfast.   04/12/2022   ipratropium-albuterol (DUONEB) 0.5-2.5 (3) MG/3ML SOLN Take 3 mLs by nebulization every 2 (two) hours as needed (ehzzeing / shortness of breath). 360 mL 0 unknown   metoprolol tartrate (LOPRESSOR) 25 MG tablet Take 1 tablet (25 mg total) by mouth 2 (two) times daily. 60 tablet 0 04/12/2022 at 2200   montelukast (SINGULAIR) 10 MG tablet Take 10 mg by mouth at bedtime.   04/12/2022   omeprazole (PRILOSEC) 40 MG capsule Take 40 mg by mouth daily.    04/12/2022   QUEtiapine (SEROQUEL) 50 MG tablet Take 50 mg by mouth at bedtime.   Past Month   roflumilast (DALIRESP) 500 MCG TABS tablet Take 500 mcg by mouth daily.   04/12/2022   SUMAtriptan (IMITREX) 100 MG tablet Take 100 mg by mouth every 2 (two) hours as needed for migraine.   unknown   warfarin (COUMADIN) 5 MG tablet Take 2.5 mg by mouth daily. Take 1/2  tablet (2.'5mg'$ ) every night   04/12/2022 at 2200   doxycycline (VIBRAMYCIN) 100 MG capsule Take 100 mg by mouth 2 (two) times daily.   hasn't started    Assessment: 81 yo M BIB EMS to ED for respiratory distress 2/2 CAP diagnosed by PCP on 04/12/22. Pt started on IV ceftriaxone and azithromycin and IV fluid resuscitation per sepsis protocol on 04/13/22 in ED. Pt is a hospice patient. Pt has a PMH of esophageal dysphagia on mechanical soft/dysphagia 3 diet, COPD, chronic HFpEF, chronic hypoxic respiratory failure on 2L O2 at home, GERD, HTN and PAF (on warfarin). Pharmacy was consulted to dose and manage warfarin for Afib on admission.   Pt reports having warfarin '5mg'$  tablets - taking 1/2 tablet, so 2.'5mg'$  daily at ~21:00-22:00. Last dose PTA 2.'5mg'$  on 12/1  ~22:00.   Today, 04/17/22: - INR remains therapeutic at 2.5, CBC stable  DDI: - Noted that patient is receiving broad spectrum antibiotics (Zosyn + doxycycline) which can potentially lead to increased INR.  - Amiodarone can lead to increased INR, however patient has been maintained on this regimen outpatient.   Goal of Therapy:  INR 2-3 Monitor platelets by anticoagulation protocol: Yes   Plan:  Warfarin 2.5 PO x1 tonight Monitor daily CBC, INR, and for s/sx of bleeding Can consider checking INR q72 hours if continues to remain stable  Dimple Nanas, PharmD, BCPS 04/17/2022 7:06 AM  Please refer to AMION for pharmacy phone number

## 2022-04-17 NOTE — Progress Notes (Signed)
Chaplain responded to [facility] consult for prayer. Patrick Montoya affirmed that he would like prayer and expressed delight that this was his second chaplain visit in a week. Patrick Montoya shared that he and his wife [name] are under hospice care and his hospice chaplain visited him the other day. Patrick Montoya was surprised that he would be eligible for hospice as there are times when he is able to be very independent, but he also recognizes that at other points he and [name] needed more care.  Chaplain asked open ended questions to facilitate story telling, life review, and emotional expression. 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 He and [name] have been together for 32 years and are the proud parents of 3 sons. Patrick Montoya proudly shared their accomplishments including their love and care for their parents. Patrick Montoya and [name] took a very intentional approach to their union, mutually agreeing upon values they would root their life around, and have maintained that commitment even now. Patrick Montoya poignantly shared his desire that he and [name] would leave this life together. He describes a life of faith and a trust in eternity, which he believes is the cornerstone to a purposeful life.  I concluded the visit with prayer in the pt's religious tradition. Patrick Montoya would appreciate follow up visits as available.  Please page as further needs arise.  Patrick Montoya. Elyn Peers, M.Div. [facility] Chaplain Pager [phone] Office [phone]        04/17/22 1433  Clinical Encounter Type  Visited With Patient;Health care provider  Visit Type Initial;Spiritual support;Psychological support  Spiritual Encounters  Spiritual Needs Emotional;Grief support;Prayer  Stress Factors  Patient Stress Factors Health changes

## 2022-04-17 NOTE — Progress Notes (Signed)
PROGRESS NOTE    Patrick Montoya  FBP:[id number] DOB: [birth year] DOA: 04/13/2022 PCP: Tracie Harrier, MD   Brief Narrative:  81 year old with past medical history significant for recurrent multifocal pneumonia in the last 2 years, esophageal dysphagia on mechanical soft diet dysphagia 3, COPD Gold stage III, chronic heart failure preserved ejection fraction, chronic hypoxic respiratory failure on 2 L of oxygen, peptic ulcer disease on PPI, hypertension, PAF on Coumadin presented with worsening productive cough, wheezing and shortness of breath.  Patient recently saw [place] pulmonologist 3 days prior to admission, repeat chest x-ray showed multifocal infiltrate, patient was started on doxycycline twice a day.   Presented with worsening symptoms, febrile, required BiPAP. Now off BIPAP on 3 L oxygen. Treating for Multifocal PNA>    Plan to continue IV antibiotics, prednisone, follow sputum culture. Needs MBS.   Assessment & Plan:   Principal Problem:   PNA (pneumonia) Active Problems:   Acute on chronic respiratory failure with hypoxia (HCC)   Bacterial pneumonia   Acute bronchitis with chronic obstructive pulmonary disease (COPD) (HCC)  1-Sepsis secondary to pneumonia/multifocal pneumonia/acute on chronic hypoxic respiratory failure: Present on admission. Patient uses 2 L oxygen at baseline.  Patient presented with fever, hypoxia, tachycardia, lactic acidosis, chest x-ray finding concerning for pneumonia.  Improving, currently on 3 L and saturating 93%.  SLP on board.  MBS is planned for today.  Blood cultures negative.  Sputum culture growing gram-positive cocci.  Continue Zosyn and doxycycline.  Continue to wean oxygen.  Incentive spirometry.  Flutter valve.   3-COPD exacerbation: As scheduled DuoNeb change, Symbicort to Pulmicort and Brovana. Continue with Prednisone.   4-Paroxysmal A-fib Continue Coumadin, continue amiodarone, INR therapeutic.   Chronic heart failure preserved  ejection fraction: Continue with metoprolol Monitor volume   Parkinson's disease: Continue with Sinemet   History of peptic ulcer disease: Continue with PPI  DVT prophylaxis: Coumadin   Code Status: DNR  Family Communication:  None present at bedside.  Plan of care discussed with patient in length and he/she verbalized understanding and agreed with it.  Status is: Inpatient Remains inpatient appropriate because: Scheduled for MBS today.   Estimated body mass index is 24.61 kg/m as calculated from the following:   Height as of this encounter: '5\' 9"'$  (1.753 m).   Weight as of this encounter: 75.6 kg.    Nutritional Assessment: Body mass index is 24.61 kg/m.Marland Kitchen Seen by dietician.  I agree with the assessment and plan as outlined below: Nutrition Status:        . Skin Assessment: I have examined the patient's skin and I agree with the wound assessment as performed by the wound care RN as outlined below:    Consultants:  None  Procedures:  None  Antimicrobials:  Anti-infectives (From admission, onward)    Start     Dose/Rate Route Frequency Ordered Stop   04/15/22 2200  piperacillin-tazobactam (ZOSYN) IVPB 3.375 g        3.375 g 12.5 mL/hr over 240 Minutes Intravenous Every 8 hours 04/15/22 1302     04/15/22 0830  metroNIDAZOLE (FLAGYL) IVPB 500 mg  Status:  Discontinued        500 mg 100 mL/hr over 60 Minutes Intravenous 2 times daily 04/15/22 0751 04/15/22 1012   04/14/22 1000  azithromycin (ZITHROMAX) 500 mg in sodium chloride 0.9 % 250 mL IVPB  Status:  Discontinued        500 mg 250 mL/hr over 60 Minutes Intravenous Every 24 hours 04/13/22 1336  04/13/22 1559   04/14/22 1000  doxycycline (VIBRA-TABS) tablet 100 mg        100 mg Oral Every 12 hours 04/13/22 1559 04/18/22 0959   04/13/22 1345  cefTRIAXone (ROCEPHIN) 2 g in sodium chloride 0.9 % 100 mL IVPB  Status:  Discontinued        2 g 200 mL/hr over 30 Minutes Intravenous Every 24 hours 04/13/22 1336 04/13/22  1345   04/13/22 1000  cefTRIAXone (ROCEPHIN) 2 g in sodium chloride 0.9 % 100 mL IVPB  Status:  Discontinued        2 g 200 mL/hr over 30 Minutes Intravenous Every 24 hours 04/13/22 0946 04/15/22 1012   04/13/22 1000  azithromycin (ZITHROMAX) 500 mg in sodium chloride 0.9 % 250 mL IVPB  Status:  Discontinued        500 mg 250 mL/hr over 60 Minutes Intravenous Every 24 hours 04/13/22 0946 04/13/22 1345         Subjective: Patient seen and examined.  He states that he feels better.  No worsening shortness of breath.  No other complaint.  Objective: Vitals:   04/16/22 1549 04/16/22 2004 04/16/22 2054 04/17/22 0409  BP: 134/80 (!) 159/88  (!) 110/59  Pulse: 65 68  66  Resp:  19  20  Temp: 98.7 F (37.1 C) 98.5 F (36.9 C)  98.3 F (36.8 C)  TempSrc: Oral Oral  Oral  SpO2: 94% 92% 96% 95%  Weight:      Height:        Intake/Output Summary (Last 24 hours) at 04/17/2022 0846 Last data filed at 04/16/2022 2201 Gross per 24 hour  Intake 120 ml  Output 800 ml  Net -680 ml   Filed Weights   04/15/22 1100  Weight: 75.6 kg    Examination:  General exam: Appears calm and comfortable  Respiratory system: Mild scattered end expiratory wheezes at the base with some scattered rhonchi.  Respiratory effort normal. Cardiovascular system: S1 & S2 heard, RRR. No JVD, murmurs, rubs, gallops or clicks. No pedal edema. Gastrointestinal system: Abdomen is nondistended, soft and nontender. No organomegaly or masses felt. Normal bowel sounds heard. Central nervous system: Alert and oriented. No focal neurological deficits. Extremities: Symmetric 5 x 5 power. Skin: No rashes, lesions or ulcers Psychiatry: Judgement and insight appear normal. Mood & affect appropriate.    Data Reviewed: I have personally reviewed following labs and imaging studies  CBC: Recent Labs  Lab 04/13/22 0940 04/13/22 1218 04/14/22 0400 04/15/22 0401 04/16/22 0413 04/17/22 0339  WBC 12.6*  --  14.9* 12.6*  13.4* 12.0*  NEUTROABS 9.1*  --   --   --   --   --   HGB 12.5* 11.2* 9.5* 9.6* 10.9* 11.7*  HCT 38.2* 33.0* 30.3* 30.2* 32.7* 37.6*  MCV 103.5*  --  106.3* 105.2* 101.6* 105.6*  PLT 262  --  172 174 221 353*   Basic Metabolic Panel: Recent Labs  Lab 04/13/22 1132 04/13/22 1218 04/14/22 0400 04/15/22 0401 04/17/22 0545  NA 147* 141 144 140 139  K 3.2* 3.4* 4.1 3.7 3.7  CL 113*  --  112* 108 104  CO2 23  --  '26 25 30  '$ GLUCOSE 126*  --  118* 93 114*  BUN 17  --  '16 19 20  '$ CREATININE 0.85  --  0.74 0.84 0.91  CALCIUM 7.1*  --  7.7* 8.4* 8.8*  MG  --   --   --   --  1.9   GFR: Estimated Creatinine Clearance: 62.6 mL/min (by C-G formula based on SCr of 0.91 mg/dL). Liver Function Tests: No results for input(s): "AST", "ALT", "ALKPHOS", "BILITOT", "PROT", "ALBUMIN" in the last 168 hours. No results for input(s): "LIPASE", "AMYLASE" in the last 168 hours. No results for input(s): "AMMONIA" in the last 168 hours. Coagulation Profile: Recent Labs  Lab 04/13/22 0940 04/14/22 0400 04/15/22 0401 04/16/22 0413 04/17/22 0339  INR 2.1* 3.1* 2.6* 2.5* 2.5*   Cardiac Enzymes: No results for input(s): "CKTOTAL", "CKMB", "CKMBINDEX", "TROPONINI" in the last 168 hours. BNP (last 3 results) No results for input(s): "PROBNP" in the last 8760 hours. HbA1C: No results for input(s): "HGBA1C" in the last 72 hours. CBG: No results for input(s): "GLUCAP" in the last 168 hours. Lipid Profile: No results for input(s): "CHOL", "HDL", "LDLCALC", "TRIG", "CHOLHDL", "LDLDIRECT" in the last 72 hours. Thyroid Function Tests: No results for input(s): "TSH", "T4TOTAL", "FREET4", "T3FREE", "THYROIDAB" in the last 72 hours. Anemia Panel: No results for input(s): "VITAMINB12", "FOLATE", "FERRITIN", "TIBC", "IRON", "RETICCTPCT" in the last 72 hours. Sepsis Labs: Recent Labs  Lab 04/13/22 0940 04/13/22 1132 04/14/22 0400 04/15/22 0401  PROCALCITON  --   --  5.68 4.20  LATICACIDVEN 2.7* 1.8  --    --     Recent Results (from the past 240 hour(s))  Urine Culture     Status: Abnormal   Collection Time: 04/13/22  9:46 AM   Specimen: In/Out Cath Urine  Result Value Ref Range Status   Specimen Description IN/OUT CATH URINE  Final   Special Requests   Final    NONE Performed at [facility], [address], [place], [place] [address]    Culture MULTIPLE SPECIES PRESENT, SUGGEST RECOLLECTION (A)  Final   Report Status 04/14/2022 FINAL  Final  Culture, blood (Routine X 2) w Reflex to ID Panel     Status: None (Preliminary result)   Collection Time: 04/13/22 10:06 AM   Specimen: BLOOD  Result Value Ref Range Status   Specimen Description BLOOD RIGHT ANTECUBITAL  Final   Special Requests   Final    BOTTLES DRAWN AEROBIC AND ANAEROBIC Blood Culture adequate volume   Culture   Final    NO GROWTH 4 DAYS Performed at [facility], [place] [address] [place], [place] [address]    Report Status PENDING  Incomplete  Resp Panel by RT-PCR (Flu A&B, Covid) Anterior Nasal Swab     Status: None   Collection Time: 04/13/22 10:40 AM   Specimen: Anterior Nasal Swab  Result Value Ref Range Status   SARS Coronavirus 2 by RT PCR NEGATIVE NEGATIVE Final    Comment: (NOTE) SARS-CoV-2 target nucleic acids are NOT DETECTED.  The SARS-CoV-2 RNA is generally detectable in upper respiratory specimens during the acute phase of infection. The lowest concentration of SARS-CoV-2 viral copies this assay can detect is 138 copies/mL. A negative result does not preclude SARS-Cov-2 infection and should not be used as the sole basis for treatment or other patient management decisions. A negative result may occur with  improper specimen collection/handling, submission of specimen other than nasopharyngeal swab, presence of viral mutation(s) within the areas targeted by this assay, and inadequate number of viral copies(<138 copies/mL). A negative result must be combined with clinical observations,  patient history, and epidemiological information. The expected result is Negative.  Fact Sheet for Patients:  EntrepreneurPulse.com.au  Fact Sheet for Healthcare Providers:  IncredibleEmployment.be  This test is no t yet  approved or cleared by the [place] and  has been authorized for detection and/or diagnosis of SARS-CoV-2 by FDA under an Emergency Use Authorization (EUA). This EUA will remain  in effect (meaning this test can be used) for the duration of the COVID-19 declaration under Section 564(b)(1) of the Act, 21 U.S.C.section 360bbb-3(b)(1), unless the authorization is terminated  or revoked sooner.       Influenza A by PCR NEGATIVE NEGATIVE Final   Influenza B by PCR NEGATIVE NEGATIVE Final    Comment: (NOTE) The Xpert Xpress SARS-CoV-2/FLU/RSV plus assay is intended as an aid in the diagnosis of influenza from Nasopharyngeal swab specimens and should not be used as a sole basis for treatment. Nasal washings and aspirates are unacceptable for Xpert Xpress SARS-CoV-2/FLU/RSV testing.  Fact Sheet for Patients: EntrepreneurPulse.com.au  Fact Sheet for Healthcare Providers: IncredibleEmployment.be  This test is not yet approved or cleared by the [place] FDA and has been authorized for detection and/or diagnosis of SARS-CoV-2 by FDA under an Emergency Use Authorization (EUA). This EUA will remain in effect (meaning this test can be used) for the duration of the COVID-19 declaration under Section 564(b)(1) of the Act, 21 U.S.C. section 360bbb-3(b)(1), unless the authorization is terminated or revoked.  Performed at [facility], [place] [address] [place], [place] [address]   Expectorated Sputum Assessment w Gram Stain, Rflx to Resp Cult     Status: None   Collection Time: 04/15/22  2:50 PM   Specimen: Sputum  Result Value Ref Range Status   Specimen Description SPU  Final    Special Requests Immunocompromised  Final   Sputum evaluation   Final    THIS SPECIMEN IS ACCEPTABLE FOR SPUTUM CULTURE Performed at [facility], [address], [place], [place] [address]    Report Status 04/15/2022 FINAL  Final  Culture, Respiratory w Gram Stain     Status: None (Preliminary result)   Collection Time: 04/15/22  2:50 PM   Specimen: Sputum  Result Value Ref Range Status   Specimen Description SPU  Final   Special Requests Immunocompromised Reflexed from [id number]  Final   Gram Stain   Final    RARE WBC PRESENT, PREDOMINANTLY PMN FEW GRAM POSITIVE COCCI    Culture   Final    TOO YOUNG TO READ Performed at [facility], [place] [address], [place], [place] [address]    Report Status PENDING  Incomplete     Radiology Studies: DG Swallowing Func-Speech Pathology  Result Date: 04/16/2022 Table formatting from the original result was not included. Objective Swallowing Evaluation: Type of Study: MBS-Modified Barium Swallow Study  Patient Details Name: Patrick Montoya MRN: [id number] Date of Birth: 09/04/1940 Today's Date: 04/16/2022 Time: SLP Start Time (ACUTE ONLY): 1350 -SLP Stop Time (ACUTE ONLY): 1420 SLP Time Calculation (min) (ACUTE ONLY): 30 min Past Medical History: Past Medical History: Diagnosis Date  Basal cell carcinoma 07/25/2015  L zygoma inf to lat canthus  Basal cell carcinoma 06/11/2018  L nasal ala  Basal cell carcinoma 10/04/2019  Left nose. Infiltrative pattern.  BPH (benign prostatic hyperplasia)   COPD (chronic obstructive pulmonary disease) (HCC)   Elevated liver enzymes   Emphysema of lung (HCC)   Gastric ulcer   GERD (gastroesophageal reflux disease)   Hepatitis B   Hypertension   Parkinson's disease   Prostate hypertrophy   Rotator cuff tear right  UTI (urinary tract infection)  Past Surgical History: Past Surgical History: Procedure Laterality  Date  CHOLECYSTECTOMY    COLONOSCOPY WITH PROPOFOL N/A 10/20/2015  Procedure: COLONOSCOPY WITH PROPOFOL;   Surgeon: Manya Silvas, MD;  Location: [facility] ENDOSCOPY;  Service: Endoscopy;  Laterality: N/A;  COLONOSCOPY WITH PROPOFOL N/A 01/24/2021  Procedure: COLONOSCOPY WITH PROPOFOL;  Surgeon: Toledo, Benay Pike, MD;  Location: [facility] ENDOSCOPY;  Service: Gastroenterology;  Laterality: N/A;  ESOPHAGOGASTRODUODENOSCOPY (EGD) WITH PROPOFOL N/A 10/20/2015  Procedure: ESOPHAGOGASTRODUODENOSCOPY (EGD) WITH PROPOFOL;  Surgeon: Manya Silvas, MD;  Location[facility] ENDOSCOPY;  Service: Endoscopy;  Laterality: N/A;  GREEN LIGHT LASER TURP (TRANSURETHRAL RESECTION OF PROSTATE N/A 11/17/2018  Procedure: GREEN LIGHT LASER TURP (TRANSURETHRAL RESECTION OF PROSTATE;  Surgeon: Royston Cowper, MD;  Location: [facility] ORS;  Service: Urology;  Laterality: N/A;  INGUINAL HERNIA REPAIR Right 02/18/2019  Procedure: HERNIA REPAIR INGUINAL ADULT;  Surgeon: Royston Cowper, MD;  Location: [facility] ORS;  Service: Urology;  Laterality: Right;  INSERTION OF MESH Right 02/18/2019  Procedure: INSERTION OF MESH;  Surgeon: Royston Cowper, MD;  Location: [facility] ORS;  Service: Urology;  Laterality: Right;  TONSILLECTOMY    TRANSURETHRAL MICROWAVE THERAPY   HPI: Pt is an 81 yo male admitted 12/2 with sepsis due to multifocal PNA. Clinical swallow eval in October 2023 recommended Dys 3 diet and thin liquids. No overt oropharyngeal dysphagia noted but did note risk for aspiration in the setting of respiratory and esophageal hx. PMH includes: hospice pt with terminal dx of COPD, recurrent PNA, esophageal dysphagia on mechanical soft diet, small hiatal hernia (per esophagram 2022, which also reported normal oropharyngeal swallow), GERD, Parkinson's disease, basal cell carcinoma (face/nose), CHF, chronic hypoxic respiratory failure on 2L O2, PUD on PPI, HTN, PAF  Subjective: says he's feeling a little better  Recommendations for follow up therapy are one component of a multi-disciplinary discharge planning process, led by the attending physician.  Recommendations may be  updated based on patient status, additional functional criteria and insurance authorization. Assessment / Plan / Recommendation   04/16/2022   3:00 PM Clinical Impressions Clinical Impression Pt has a mild oropharyngeal dysphagia largely characterized by impaired timing that allows for penetration and silent aspiration of thin and nectar thick liquids that spill into the airway prior to airway closure. Pt is capable of achieving adequate airway protection at times, but his timing is not consistent and given that he only coughed x1 during MBS, it would be hard to monitor clinically. Pt had improved containment above the valleculae and out of the laryngeal vestibule with thin liquids when using a chin tuck. This strategy, if it can be used consistently, seems to be more reliable than use of thickener given silent aspiration of nectar thick liquids as well. Education was provided about rationale for chin tuck, how to implement it, and how to account for mixed consistencies given that pt loves to eat milk and cereal but thin liquids via spoon were also aspirated. Pt was open to trying these strategies and agreeable to ongoing SLP f/u. SLP Visit Diagnosis Dysphagia, pharyngeal phase (R13.13) Impact on safety and function Moderate aspiration risk     04/16/2022   3:00 PM Treatment Recommendations Treatment Recommendations Therapy as outlined in treatment plan below     02/21/2022   3:21 PM Prognosis Prognosis for Safe Diet Advancement Fair Barriers to Reach Goals Time post onset;Severity of deficits Barriers/Prognosis Comment Edentulous; COPD; missing most Dentition; deconditioned; parkinson's dis. On Hospice services.   04/16/2022   3:00 PM Diet Recommendations SLP Diet Recommendations Dysphagia 3 (Mech soft) solids;Thin liquid Liquid  Administration via Straw Medication Administration Whole meds with puree Compensations Slow rate;Small sips/bites;Follow solids with liquid;Chin tuck;Use straw to facilitate chin tuck;Other  (Comment) Postural Changes Seated upright at 90 degrees;Remain semi-upright after after feeds/meals (Comment)     04/16/2022   3:00 PM Other Recommendations Oral Care Recommendations Oral care BID Follow Up Recommendations Home health SLP Functional Status Assessment Patient has had a recent decline in their functional status and demonstrates the ability to make significant improvements in function in a reasonable and predictable amount of time.   04/16/2022   3:00 PM Frequency and Duration  Speech Therapy Frequency (ACUTE ONLY) min 2x/week Treatment Duration 2 weeks     04/16/2022   3:00 PM Oral Phase Oral Phase Impaired Oral - Honey Teaspoon WFL Oral - Nectar Teaspoon WFL Oral - Nectar Cup WFL Oral - Thin Teaspoon Decreased bolus cohesion Oral - Thin Cup Lingual pumping;Delayed oral transit Oral - Thin Straw WFL Oral - Puree WFL Oral - Regular WFL Oral - Pill [facility]    04/16/2022   3:00 PM Pharyngeal Phase Pharyngeal Phase Impaired Pharyngeal- Honey Teaspoon WFL Pharyngeal- Nectar Teaspoon WFL Pharyngeal- Nectar Cup Reduced airway/laryngeal closure;Penetration/Aspiration during swallow Pharyngeal Material enters airway, passes BELOW cords without attempt by patient to eject out (silent aspiration) Pharyngeal- Thin Teaspoon Penetration/Aspiration before swallow Pharyngeal Material enters airway, remains ABOVE vocal cords then ejected out Pharyngeal- Thin Cup Reduced airway/laryngeal closure;Penetration/Aspiration during swallow Pharyngeal Material enters airway, passes BELOW cords without attempt by patient to eject out (silent aspiration) Pharyngeal- Thin Straw Penetration/Aspiration before swallow Pharyngeal Material enters airway, remains ABOVE vocal cords and not ejected out Pharyngeal- Puree WFL Pharyngeal- Regular Penetration/Aspiration before swallow Pharyngeal Material enters airway, remains ABOVE vocal cords then ejected out Pharyngeal- Pill [facility]    04/16/2022   3:00 PM Cervical Esophageal Phase  Cervical  Esophageal Phase [facility] Osie Bond., M.A. CCC-SLP Acute Rehabilitation Services Office [phone] Secure chat preferred 04/16/2022, 3:44 PM                      Scheduled Meds:  amiodarone  100 mg Oral Daily   arformoterol  15 mcg Nebulization BID   budesonide (PULMICORT) nebulizer solution  0.25 mg Nebulization BID   carbidopa-levodopa  2 tablet Oral 5 X Daily   doxycycline  100 mg Oral Q12H   ferrous gluconate  324 mg Oral Q lunch   fluticasone  1 spray Each Nare Daily   guaiFENesin  1,200 mg Oral BID   ipratropium-albuterol  3 mL Nebulization BID   metoprolol tartrate  25 mg Oral BID   montelukast  10 mg Oral QHS   mouth rinse  15 mL Mouth Rinse 4 times per day   pantoprazole  40 mg Oral BID AC   pneumococcal 20-valent conjugate vaccine  0.5 mL Intramuscular Tomorrow-1000   predniSONE  40 mg Oral Q breakfast   QUEtiapine  50 mg Oral QHS   warfarin  2.5 mg Oral ONCE-1600   Warfarin - Pharmacist Dosing Inpatient   Does not apply q1600   Continuous Infusions:  sodium chloride Stopped (04/15/22 2204)   piperacillin-tazobactam (ZOSYN)  IV 3.375 g (04/17/22 0716)     LOS: 4 days   Darliss Cheney, MD [facility]  04/17/2022, 8:46 AM   *Please note that this is a verbal dictation therefore any spelling or grammatical errors are due to the "[place]" system interpretation.  Please page via [place] and do not message via secure chat for urgent patient care  matters. Secure chat can be used for non urgent patient care matters.  How to contact the [facility] Attending or Consulting provider [place] or covering provider during after hours [place], for this patient?  Check the care team in [facility] and look for a) attending/consulting TR[facility] provider listed and b) the [facility] team listed. Page or secure chat [phone]. Log into [email or web address] and use [place]'s universal password to access. If you do not have the password, please contact the hospital operator. Locate the [facility] provider you are looking  for under [facility] and page to a number that you can be directly reached. If you still have difficulty reaching the provider, please page the [facility] (Director on Call) for the Hospitalists listed on amion for assistance.

## 2022-04-17 NOTE — Progress Notes (Addendum)
[facility]) Hospital Liaison Note    [name] is a current hospice patient with a terminal diagnosis of COPD. Patient was brought to [facility] ED by his wife due to low 02 sats in the 80's. Patient was evaluated in the [facility] ED and was admitted on 12.02 for pneumonia. Per Dr. Karie Montoya, [facility] MD, this is a related hospital admission.    Visted [name] at bedside. Patient is alert and conversant. Very pleasant. No family present at bedside. Hospital chaplain had just been in visiting when I arrived. He is currently receiving IV antibiotics and was moved from 4L to 3L of 02 this morning. Oxygen decreased from 5L to 3L between yesterday and today. [name] reports that he was on 3L at home prior to coming to the hospital and that he can tell that his breathing is improving. No reports of pain or discomfort, other than having a headache yesterday that the patient reports is brought on by anxiety. Had a speech consultation, patient reports that he learned a lot about how to low down and swallow better to hopefully prevent aspiration, which he understands will help with pneumonia. Recommendation was mechanical soft and thin liquids. Called wife, Patrick Montoya via telephone to provide an update. Left a message. Report exchanged with RN and chaplain.   Patient is inpatient appropriate due to receiving IV antibiotics for treatment of pneumonia.    V/S: 110/59 bp, 98.3 Temp, 66 bpm, 20 RR, 95% on 4L/min  I/O: 120/800  Labs:  Glucose: 114 (H) Calcium: 8.8 (L) WBC: 12.0 (H) RBC: 3.56 (L) Hemoglobin: 11.7 (L) HCT: 37.6 (L) MCV: 105.6 (H) Platelets: 149 (L) Prothrombin Time: 26.5 (H) INR: 2.5 (H)  Diagnostics:  None today.   IV/PRN:  piperacillin-tazobactam (ZOSYN) IVPB 3.375 g Dose: 3.375 g Freq: Every 8 hours Route: IV x2  ALPRAZolam (XANAX) tablet 0.5 mg Dose: 0.5 mg Freq: At bedtime PRN Route: PO x1  acetaminophen (TYLENOL) tablet 650 mg Dose: 650 mg Freq: Every 6  hours PRN Route: PO x1    Problem list: Assessment & Plan:   Principal Problem:   PNA (pneumonia) Active Problems:   Acute on chronic respiratory failure with hypoxia (HCC)   Bacterial pneumonia   Acute bronchitis with chronic obstructive pulmonary disease (COPD) (HCC)   1-Sepsis secondary to pneumonia/multifocal pneumonia/acute on chronic hypoxic respiratory failure: Present on admission. Patient uses 2 L oxygen at baseline.  Patient presented with fever, hypoxia, tachycardia, lactic acidosis, chest x-ray finding concerning for pneumonia.  Improving, currently on 3 L and saturating 93%.  SLP on board.  MBS is planned for today.  Blood cultures negative.  Sputum culture growing gram-positive cocci.  Continue Zosyn and doxycycline.  Continue to wean oxygen.  Incentive spirometry.  Flutter valve.   3-COPD exacerbation: As scheduled DuoNeb change, Symbicort to Pulmicort and Brovana. Continue with Prednisone.   4-Paroxysmal A-fib Continue Coumadin, continue amiodarone, INR therapeutic.   Chronic heart failure preserved ejection fraction: Continue with metoprolol Monitor volume   Parkinson's disease: Continue with Sinemet   History of peptic ulcer disease: Continue with PPI  GOC: Patient is a DNR.    D/C planning: Ongoing.    Family: Called wife, Patrick Montoya via telephone and left a message.  IDT: Updated   Patrick Gottron, RN [facility] Liaison  [phone]

## 2022-04-17 NOTE — Progress Notes (Signed)
Mobility Specialist Progress Note   04/17/22 1740  Mobility  Activity Transferred from chair to bed  Level of Assistance Contact guard assist, steadying assist  Assistive Device None  Distance Ambulated (ft) 2 ft  Activity Response Tolerated well  $Mobility charge 1 Mobility   Pt requesting to get get back to bed. MinG for line management but no physical assist needed. Left supine in bed w/o fault and call bell by side.   Holland Falling Mobility Specialist Please contact via SecureChat or  Rehab office at [phone]

## 2022-04-17 NOTE — Progress Notes (Signed)
Speech Language Pathology Treatment: Dysphagia  Patient Details Name: Patrick Montoya MRN: [id number] DOB: 1940/06/12 Today's Date: 04/17/2022 Time: 3202-3343 SLP Time Calculation (min) (ACUTE ONLY): 20 min  Assessment / Plan / Recommendation Clinical Impression  Pt pleasant and alert. Found taking sips of juice from a cup without tucking chin. When asked about results of MBS and recommendations pt verbalized chin tuck strategy and reported it is easier for him to use if he has a straw. He feels as if its a cue. Pt then demonstrates sipping with strategy and also verbalized some independent awareness and problem solving regarding timing chin tuck and swallowing. He also recalled strategy to eat cereal and importance of oral care. SLP reviewed rationale for oral care and schedule for this. Also discussed sleeping on a wedge to reduce risk of night time aspiration. Pt also reported a sensation of food in the back of his throat when he wakes up in the middle of the night. Acute education complete. Pt to f/u with [facility] SLP after return home.   HPI HPI: Pt is an 81 yo male admitted 12/2 with sepsis due to multifocal PNA. Clinical swallow eval in October 2023 recommended Dys 3 diet and thin liquids. No overt oropharyngeal dysphagia noted but did note risk for aspiration in the setting of respiratory and esophageal hx. PMH includes: hospice pt with terminal dx of COPD, recurrent PNA, esophageal dysphagia on mechanical soft diet, small hiatal hernia (per esophagram 2022, which also reported normal oropharyngeal swallow), GERD, Parkinson's disease, basal cell carcinoma (face/nose), CHF, chronic hypoxic respiratory failure on 2L O2, PUD on PPI, HTN, PAF      SLP Plan  All goals met      Recommendations for follow up therapy are one component of a multi-disciplinary discharge planning process, led by the attending physician.  Recommendations may be updated based on patient status, additional functional criteria  and insurance authorization.    Recommendations  Diet recommendations: Dysphagia 3 (mechanical soft);Thin liquid Liquids provided via: Straw Medication Administration: Whole meds with liquid Supervision: Patient able to self feed Compensations: Slow rate;Small sips/bites;Follow solids with liquid;Chin tuck;Use straw to facilitate chin tuck;Other (Comment)                Follow Up Recommendations: Home health SLP SLP Visit Diagnosis: Dysphagia, pharyngeal phase (R13.13) Plan: All goals met           Patrick Montoya, Katherene Ponto  04/17/2022, 1:51 PM

## 2022-04-18 ENCOUNTER — Other Ambulatory Visit (HOSPITAL_COMMUNITY): Payer: Self-pay

## 2022-04-18 DIAGNOSIS — J189 Pneumonia, unspecified organism: Secondary | ICD-10-CM | POA: Diagnosis not present

## 2022-04-18 LAB — CBC
HCT: 34 % — ABNORMAL LOW (ref 39.0–52.0)
Hemoglobin: 11.3 g/dL — ABNORMAL LOW (ref 13.0–17.0)
MCH: 33.2 pg (ref 26.0–34.0)
MCHC: 33.2 g/dL (ref 30.0–36.0)
MCV: 100 fL (ref 80.0–100.0)
Platelets: 237 10*3/uL (ref 150–400)
RBC: 3.4 MIL/uL — ABNORMAL LOW (ref 4.22–5.81)
RDW: 14.4 % (ref 11.5–15.5)
WBC: 12.2 10*3/uL — ABNORMAL HIGH (ref 4.0–10.5)
nRBC: 0 % (ref 0.0–0.2)

## 2022-04-18 LAB — CULTURE, RESPIRATORY W GRAM STAIN

## 2022-04-18 LAB — CULTURE, BLOOD (ROUTINE X 2)
Culture: NO GROWTH
Special Requests: ADEQUATE

## 2022-04-18 LAB — PROTIME-INR
INR: 2.9 — ABNORMAL HIGH (ref 0.8–1.2)
Prothrombin Time: 30.3 seconds — ABNORMAL HIGH (ref 11.4–15.2)

## 2022-04-18 MED ORDER — WARFARIN SODIUM 1 MG PO TABS
1.0000 mg | ORAL_TABLET | Freq: Once | ORAL | Status: DC
  Filled 2022-04-18: qty 1

## 2022-04-18 MED ORDER — AMOXICILLIN-POT CLAVULANATE 875-125 MG PO TABS
1.0000 | ORAL_TABLET | Freq: Two times a day (BID) | ORAL | 0 refills | Status: AC
  Filled 2022-04-18: qty 14, 7d supply, fill #0

## 2022-04-18 NOTE — TOC Initial Note (Addendum)
Transition of Care [facility]) - Initial/Assessment Note    Patient Details  Name: Patrick Montoya MRN: [id number] Date of Birth: 09-19-1940  Transition of Care [facility]) CM/SW Contact:    Joanne Chars, LCSW Phone Number: 04/18/2022, 10:24 AM  Clinical Narrative:   CSW met with pt for initial assessment, per MD pt ready for DC today.  Pt alert, oriented x4, able to participate in conversation.  Pt confirms that he is active with [facility],  he lives with his wife, who is also under [facility] hospice care.   Confirms plan to return home and continue with [facility] hospice services.  Permission given to speak with wife and with son [name].  Pt asked about transportation, discussed [facility] will cover transport home.  CSW spoke with Sarah/[facility] and informed her of DC.  She confirmed that we can use [facility] for transport.  Pt does have home O2 and all needed DME in place.  She will communicate with pt wife about DC.    CSW spoke with son [name] and informed him of DC. Pt wife is ambulatory and can receive pt at home.   1050:  Transport arranged through [facility].               Expected Discharge Plan: Home w Hospice Care Barriers to Discharge: No Barriers Identified   Patient Goals and CMS Choice     Choice offered to / list presented to : Patient (current [facility] client)  Expected Discharge Plan and Services Expected Discharge Plan: Home w Hospice Care In-house Referral: Clinical Social Work   Post Acute Care Choice: Hospice   Expected Discharge Date: 04/18/22                                    Prior Living Arrangements/Services   Lives with:: Spouse Patient language and need for interpreter reviewed:: Yes Do you feel safe going back to the place where you live?: Yes      Need for Family Participation in Patient Care: Yes (Comment) Care giver support system in place?: Yes (comment) Current home services: Hospice ([facility]) Criminal  Activity/Legal Involvement Pertinent to Current Situation/Hospitalization: No - Comment as needed  Activities of Daily Living Home Assistive Devices/Equipment: Eyeglasses, Dentures (specify type) ADL Screening (condition at time of admission) Patient's cognitive ability adequate to safely complete daily activities?: Yes Is the patient deaf or have difficulty hearing?: No Does the patient have difficulty seeing, even when wearing glasses/contacts?: No Does the patient have difficulty concentrating, remembering, or making decisions?: No Patient able to express need for assistance with ADLs?: Yes Does the patient have difficulty dressing or bathing?: No Independently performs ADLs?: Yes (appropriate for developmental age) (Per patient) Does the patient have difficulty walking or climbing stairs?: No Weakness of Legs: None Weakness of Arms/Hands: None  Permission Sought/Granted Permission sought to share information with : Family Supports Permission granted to share information with : Yes, Verbal Permission Granted  Share Information with NAME: wife [name], son Tim           Emotional Assessment Appearance:: Appears stated age Attitude/Demeanor/Rapport: Engaged Affect (typically observed): Appropriate, Pleasant Orientation: : Oriented to Self, Oriented to Place, Oriented to  Time, Oriented to Situation      Admission diagnosis:  PNA (pneumonia) [J18.9] Community acquired pneumonia, unspecified laterality [J18.9] Patient Active Problem List   Diagnosis Date Noted   PNA (pneumonia) 04/13/2022   Atrial fibrillation (  [place]) 02/21/2022   Hypokalemia 02/20/2022   Multifocal pneumonia 08/01/2021   HTN (hypertension) 08/01/2021   Sepsis ([place]) 08/01/2021   BPH (benign prostatic hyperplasia) 08/01/2021   COPD exacerbation ([place]) 07/22/2021   Acute on chronic respiratory failure with hypoxia ([place]) 07/22/2021   Paroxysmal atrial fibrillation ([place]) 07/22/2021   Essential hypertension  07/22/2021   History of hepatitis D 07/22/2021   Chronic respiratory failure with hypoxia ([place]) 06/29/2021   History of gastric ulcer 06/29/2021   Paroxysmal atrial fibrillation with rapid ventricular response ([place]) 06/29/2021   Rapid atrial fibrillation ([place]) 06/29/2021   Long term (current) use of anticoagulants 06/20/2021   Chest pain    Acute respiratory failure with hypoxia ([place]) 02/26/2021   Sepsis due to undetermined organism ([place]) 02/26/2021   Bacterial pneumonia 02/26/2021   Parkinson's disease 03/26/2019   Sternal fracture with retrosternal contusion, closed, initial encounter 10/31/2017   Chronic bronchitis ([place]) 05/28/2015   Gastro-esophageal reflux disease without esophagitis 05/26/2015   Insomnia, 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   COPD with acute exacerbation ([place]) 04/11/2014   Benign prostatic hypertrophy without urinary obstruction 01/21/2014   Acute bronchitis with chronic obstructive pulmonary disease (COPD) ([place]) 01/21/2014   Accelerated hypertension 01/21/2014   SINUSITIS- ACUTE-NOS 07/03/2007   RENAL CALCULUS 06/05/2007   HYPERTENSION 02/27/2007   COPD (chronic obstructive pulmonary disease) ([place]) 02/27/2007   GERD 02/27/2007   PEPTIC ULCER DISEASE 02/27/2007   BENIGN PROSTATIC HYPERTROPHY 02/27/2007   BURSITIS, RIGHT SHOULDER 02/27/2007   COLONIC POLYPS, HX OF 02/27/2007   FROZEN RIGHT SHOULDER 12/05/2006   PCP:  Tracie Harrier, MD Pharmacy:   [facility] [address] [place] [address] Phone: [phone] Fax: [facility] [place]) [place] [address] Phone: [phone] Fax: [phone]  [facility] [address] [place] [address] Phone: [phone] Fax: [phone]     Social Determinants of Health (SDOH) Interventions    Readmission Risk Interventions     02/25/2022   12:00 PM 07/25/2021    9:58 AM  Readmission Risk Prevention Plan  Transportation Screening Complete Complete  PCP or Specialist Appt within 3-5 Days Complete Complete  HRI or [place] Complete Complete  Social Work Consult for [place] Planning/Counseling Complete   Palliative Care Screening Complete Not Applicable  Medication Review Press photographer) Complete Complete

## 2022-04-18 NOTE — Progress Notes (Signed)
   04/18/22 0853  Assess: MEWS Score  Temp 98 F (36.7 C)  BP 136/89  MAP (mmHg) 102  Pulse Rate 67  ECG Heart Rate 69  Resp 20  SpO2 95 %  O2 Device Nasal Cannula  Assess: MEWS Score  MEWS Temp 0  MEWS Systolic 0  MEWS Pulse 0  MEWS RR 0  MEWS LOC 0  MEWS Score 0  MEWS Score Color Green  Assess: if the MEWS score is Yellow or Red  Were vital signs taken at a resting state? Yes  Focused Assessment No change from prior assessment  Does the patient meet 2 or more of the SIRS criteria? No  MEWS guidelines implemented *See Row Information* No, vital signs rechecked  Treat  MEWS Interventions Administered scheduled meds/treatments  Pain Scale 0-10  Pain Score 0  Patients Stated Pain Goal 0  Pain Intervention(s) Repositioned  Document  Patient Outcome Stabilized after interventions  Assess: SIRS CRITERIA  SIRS Temperature  0  SIRS Pulse 0  SIRS Respirations  0  SIRS WBC 1  SIRS Score Sum  1

## 2022-04-18 NOTE — Discharge Summary (Signed)
Physician Discharge Summary  Patrick Montoya YJE:[id number] DOB: [birth year] DOA: 04/13/2022  PCP: Tracie Harrier, MD  Admit date: 04/13/2022 Discharge date: 04/18/2022 30 Day Unplanned Readmission Risk Score    Flowsheet Row ED to Hosp-Admission (Current) from 04/13/2022 in [facility]  30 Day Unplanned Readmission Risk Score (%) 29.31 Filed at 04/18/2022 0801       This score is the patient's risk of an unplanned readmission within 30 days of being discharged (0 -100%). The score is based on dignosis, age, lab data, medications, orders, and past utilization.   Low:  0-14.9   Medium: 15-21.9   High: 22-29.9   Extreme: 30 and above          Admitted From: Home Disposition: Home with hospice  Recommendations for Outpatient Follow-up:  Follow up with PCP in 1-2 weeks Please obtain BMP/CBC in one week Please follow up with your PCP on the following pending results: Unresulted Labs (From admission, onward)     Start     Ordered   04/14/22 0500  CBC  Daily,   R      04/13/22 1559   04/14/22 0500  Protime-INR  Daily,   R      04/13/22 Pace: None Equipment/Devices: None  Discharge Condition: Stable CODE STATUS: DNR Diet recommendation: Dysphagia 3 diet  Subjective: Seen and examined.  He feels well.  No complaints.  Brief/Interim Summary: 81 year old with past medical history significant for recurrent multifocal pneumonia in the last 2 years, esophageal dysphagia on mechanical soft diet dysphagia 3, COPD Gold stage III, chronic heart failure preserved ejection fraction, chronic hypoxic respiratory failure on 2 L of oxygen, peptic ulcer disease on PPI, hypertension, PAF on Coumadin presented with worsening productive cough, wheezing and shortness of breath.  Patient recently saw [facility] pulmonologist 3 days prior to admission, repeat chest x-ray showed multifocal infiltrate, patient was started on doxycycline twice a  day.  He was again admitted secondary to sepsis due to multifocal pneumonia and acute on chronic hypoxic respiratory failure since he was requiring more than 2 L of oxygen.  He was started on Zosyn as well as doxycycline.  SLP was consulted, he underwent MBS, they recommended dysphagia 3 diet once again.  Patient has improved.  Currently on 3 L of oxygen which is very close to his baseline.  He is stable for discharge.  He received Zosyn during this hospitalization and I am discharging him on 1 more week of oral Augmentin.   3-COPD exacerbation: He was started on prednisone 40 mg p.o. daily which she received for 4 to 5 days along with bronchodilators, he does not have any wheezes.  No prednisone at discharge.  4-Paroxysmal A-fib Continue Coumadin, continue amiodarone, INR therapeutic.   Chronic heart failure preserved ejection fraction: Continue with metoprolol Monitor volume   Parkinson's disease: Continue with Sinemet   History of peptic ulcer disease: Continue with PPI  Discharge plan was discussed with patient and/or family member and they verbalized understanding and agreed with it.  Discharge Diagnoses:  Principal Problem:   PNA (pneumonia) Active Problems:   Acute on chronic respiratory failure with hypoxia (HCC)   Bacterial pneumonia   Acute bronchitis with chronic obstructive pulmonary disease (COPD) ([place])    Discharge Instructions   Allergies as of 04/18/2022   No Known Allergies      Medication List  STOP taking these medications    doxycycline 100 MG capsule Commonly known as: VIBRAMYCIN       TAKE these medications    albuterol 108 (90 Base) MCG/ACT inhaler Commonly known as: VENTOLIN HFA Inhale 2 puffs into the lungs every 6 (six) hours as needed for wheezing or shortness of breath.   ALPRAZolam 0.5 MG tablet Commonly known as: XANAX Take 0.5 mg by mouth at bedtime as needed for anxiety or sleep.   amiodarone 200 MG tablet Commonly known as:  PACERONE Take 100 mg by mouth daily.   amoxicillin-clavulanate 875-125 MG tablet Commonly known as: AUGMENTIN Take 1 tablet by mouth 2 (two) times daily for 7 days.   budesonide-formoterol 160-4.5 MCG/ACT inhaler Commonly known as: Symbicort Inhale 2 puffs into the lungs 2 (two) times daily.   carbidopa-levodopa 25-100 MG tablet Commonly known as: SINEMET IR Take 2 tablets by mouth 5 (five) times daily. Takes at 0800, 1200, 1600, 2000, and 00:00 (midnight)   [facility] Vitamin B-12 2000 MCG Tbcr Generic drug: Cyanocobalamin Take 2,000 mcg by mouth daily.   ferrous gluconate 324 MG tablet Commonly known as: FERGON Take 324 mg by mouth daily with breakfast.   ipratropium-albuterol 0.5-2.5 (3) MG/3ML Soln Commonly known as: DUONEB Take 3 mLs by nebulization every 2 (two) hours as needed (ehzzeing / shortness of breath).   metoprolol tartrate 25 MG tablet Commonly known as: LOPRESSOR Take 1 tablet (25 mg total) by mouth 2 (two) times daily.   montelukast 10 MG tablet Commonly known as: SINGULAIR Take 10 mg by mouth at bedtime.   omeprazole 40 MG capsule Commonly known as: PRILOSEC Take 40 mg by mouth daily.   QUEtiapine 50 MG tablet Commonly known as: SEROQUEL Take 50 mg by mouth at bedtime.   roflumilast 500 MCG Tabs tablet Commonly known as: DALIRESP Take 500 mcg by mouth daily.   SUMAtriptan 100 MG tablet Commonly known as: IMITREX Take 100 mg by mouth every 2 (two) hours as needed for migraine.   warfarin 5 MG tablet Commonly known as: COUMADIN Take 2.5 mg by mouth daily. Take 1/2 tablet (2.'5mg'$ ) every night        Follow-up Information     Hande, Vishwanath, MD Follow up in 1 week(s).   Specialty: Internal Medicine Contact information: [address] [place] [address] [phone]                No Known Allergies  Consultations: None   Procedures/Studies: DG Swallowing Func-Speech Pathology  Result Date:  04/16/2022 Table formatting from the original result was not included. Objective Swallowing Evaluation: Type of Study: MBS-Modified Barium Swallow Study  Patient Details Name: Patrick Montoya MRN: [id number] Date of Birth: 1941-04-07 Today's Date: 04/16/2022 Time: SLP Start Time (ACUTE ONLY): 1350 -SLP Stop Time (ACUTE ONLY): 1420 SLP Time Calculation (min) (ACUTE ONLY): 30 min Past Medical History: Past Medical History: Diagnosis Date  Basal cell carcinoma 07/25/2015  L zygoma inf to lat canthus  Basal cell carcinoma 06/11/2018  L nasal ala  Basal cell carcinoma 10/04/2019  Left nose. Infiltrative pattern.  BPH (benign prostatic hyperplasia)   COPD (chronic obstructive pulmonary disease) (HCC)   Elevated liver enzymes   Emphysema of lung (HCC)   Gastric ulcer   GERD (gastroesophageal reflux disease)   Hepatitis B   Hypertension   Parkinson's disease   Prostate hypertrophy   Rotator cuff tear right  UTI (urinary tract infection)  Past Surgical History: Past Surgical History: Procedure  Laterality Date  CHOLECYSTECTOMY    COLONOSCOPY WITH PROPOFOL N/A 10/20/2015  Procedure: COLONOSCOPY WITH PROPOFOL;  Surgeon: Manya Silvas, MD;  Location: [facility] ENDOSCOPY;  Service: Endoscopy;  Laterality: N/A;  COLONOSCOPY WITH PROPOFOL N/A 01/24/2021  Procedure: COLONOSCOPY WITH PROPOFOL;  Surgeon: Toledo, Benay Pike, MD;  Location: [facility] ENDOSCOPY;  Service: Gastroenterology;  Laterality: N/A;  ESOPHAGOGASTRODUODENOSCOPY (EGD) WITH PROPOFOL N/A 10/20/2015  Procedure: ESOPHAGOGASTRODUODENOSCOPY (EGD) WITH PROPOFOL;  Surgeon: Manya Silvas, MD;  Location: [facility] ENDOSCOPY;  Service: Endoscopy;  Laterality: N/A;  GREEN LIGHT LASER TURP (TRANSURETHRAL RESECTION OF PROSTATE N/A 11/17/2018  Procedure: GREEN LIGHT LASER TURP (TRANSURETHRAL RESECTION OF PROSTATE;  Surgeon: Royston Cowper, MD;  Location: [facility] ORS;  Service: Urology;  Laterality: N/A;  INGUINAL HERNIA REPAIR Right 02/18/2019  Procedure: HERNIA REPAIR INGUINAL ADULT;  Surgeon: Royston Cowper, MD;  Location: [facility] ORS;  Service: Urology;  Laterality: Right;  INSERTION OF MESH Right 02/18/2019  Procedure: INSERTION OF MESH;  Surgeon: Royston Cowper, MD;  Location: [facility] ORS;  Service: Urology;  Laterality: Right;  TONSILLECTOMY    TRANSURETHRAL MICROWAVE THERAPY   HPI: Pt is an 81 yo male admitted 12/2 with sepsis due to multifocal PNA. Clinical swallow eval in October 2023 recommended Dys 3 diet and thin liquids. No overt oropharyngeal dysphagia noted but did note risk for aspiration in the setting of respiratory and esophageal hx. PMH includes: hospice pt with terminal dx of COPD, recurrent PNA, esophageal dysphagia on mechanical soft diet, small hiatal hernia (per esophagram 2022, which also reported normal oropharyngeal swallow), GERD, Parkinson's disease, basal cell carcinoma (face/nose), CHF, chronic hypoxic respiratory failure on 2L O2, PUD on PPI, HTN, PAF  Subjective: says he's feeling a little better  Recommendations for follow up therapy are one component of a multi-disciplinary discharge planning process, led by the attending physician.  Recommendations may be updated based on patient status, additional functional criteria and insurance authorization. Assessment / Plan / Recommendation   04/16/2022   3:00 PM Clinical Impressions Clinical Impression Pt has a mild oropharyngeal dysphagia largely characterized by impaired timing that allows for penetration and silent aspiration of thin and nectar thick liquids that spill into the airway prior to airway closure. Pt is capable of achieving adequate airway protection at times, but his timing is not consistent and given that he only coughed x1 during MBS, it would be hard to monitor clinically. Pt had improved containment above the valleculae and out of the laryngeal vestibule with thin liquids when using a chin tuck. This strategy, if it can be used consistently, seems to be more reliable than use of thickener given silent aspiration of  nectar thick liquids as well. Education was provided about rationale for chin tuck, how to implement it, and how to account for mixed consistencies given that pt loves to eat milk and cereal but thin liquids via spoon were also aspirated. Pt was open to trying these strategies and agreeable to ongoing SLP f/u. SLP Visit Diagnosis Dysphagia, pharyngeal phase (R13.13) Impact on safety and function Moderate aspiration risk     04/16/2022   3:00 PM Treatment Recommendations Treatment Recommendations Therapy as outlined in treatment plan below     02/21/2022   3:21 PM Prognosis Prognosis for Safe Diet Advancement Fair Barriers to Reach Goals Time post onset;Severity of deficits Barriers/Prognosis Comment Edentulous; COPD; missing most Dentition; deconditioned; parkinson's dis. On Hospice services.   04/16/2022   3:00 PM Diet Recommendations SLP Diet Recommendations Dysphagia 3 (Mech soft) solids;Thin liquid  Liquid Administration via Straw Medication Administration Whole meds with puree Compensations Slow rate;Small sips/bites;Follow solids with liquid;Chin tuck;Use straw to facilitate chin tuck;Other (Comment) Postural Changes Seated upright at 90 degrees;Remain semi-upright after after feeds/meals (Comment)     04/16/2022   3:00 PM Other Recommendations Oral Care Recommendations Oral care BID Follow Up Recommendations Home health SLP Functional Status Assessment Patient has had a recent decline in their functional status and demonstrates the ability to make significant improvements in function in a reasonable and predictable amount of time.   04/16/2022   3:00 PM Frequency and Duration  Speech Therapy Frequency (ACUTE ONLY) min 2x/week Treatment Duration 2 weeks     04/16/2022   3:00 PM Oral Phase Oral Phase Impaired Oral - Honey Teaspoon WFL Oral - Nectar Teaspoon WFL Oral - Nectar Cup WFL Oral - Thin Teaspoon Decreased bolus cohesion Oral - Thin Cup Lingual pumping;Delayed oral transit Oral - Thin Straw WFL Oral - Puree  WFL Oral - Regular WFL Oral - Pill [facility]    04/16/2022   3:00 PM Pharyngeal Phase Pharyngeal Phase Impaired Pharyngeal- Honey Teaspoon WFL Pharyngeal- Nectar Teaspoon WFL Pharyngeal- Nectar Cup Reduced airway/laryngeal closure;Penetration/Aspiration during swallow Pharyngeal Material enters airway, passes BELOW cords without attempt by patient to eject out (silent aspiration) Pharyngeal- Thin Teaspoon Penetration/Aspiration before swallow Pharyngeal Material enters airway, remains ABOVE vocal cords then ejected out Pharyngeal- Thin Cup Reduced airway/laryngeal closure;Penetration/Aspiration during swallow Pharyngeal Material enters airway, passes BELOW cords without attempt by patient to eject out (silent aspiration) Pharyngeal- Thin Straw Penetration/Aspiration before swallow Pharyngeal Material enters airway, remains ABOVE vocal cords and not ejected out Pharyngeal- Puree WFL Pharyngeal- Regular Penetration/Aspiration before swallow Pharyngeal Material enters airway, remains ABOVE vocal cords then ejected out Pharyngeal- Pill [facility]    04/16/2022   3:00 PM Cervical Esophageal Phase  Cervical Esophageal Phase [facility] Osie Bond., M.A. [place] [facility] Office [phone] Secure chat preferred 04/16/2022, 3:44 PM                     CT Chest High Resolution  Result Date: 04/15/2022 CLINICAL DATA:  Hypoxemia. EXAM: CT CHEST WITHOUT CONTRAST TECHNIQUE: Multidetector CT imaging of the chest was performed following the standard protocol without intravenous contrast. High resolution imaging of the lungs, as well as inspiratory and expiratory imaging, was performed. RADIATION DOSE REDUCTION: This exam was performed according to the departmental dose-optimization program which includes automated exposure control, adjustment of the mA and/or kV according to patient size and/or use of iterative reconstruction technique. COMPARISON:  02/20/2022, 08/01/2021 and 02/26/2021. FINDINGS: Cardiovascular: Atherosclerotic  calcification of the aorta, aortic valve and coronary arteries. Enlarged pulmonic trunk and heart. Small pericardial effusion. Mediastinum/Nodes: Low right paratracheal lymph node measures 17 mm, similar to 02/26/2021. Hilar regions are difficult to evaluate without IV contrast. Subcarinal lymph node measures 1.7 cm, also similar. Axillary lymph nodes are not enlarged by CT size criteria. Esophagus is partially air-filled, which can be seen with dysmotility. Lungs/Pleura: Severe centrilobular and paraseptal emphysema. Image quality is degraded by respiratory motion, especially in the mid and lower lung zones, where there is patchy ground-glass. Areas of nodular consolidation in the right upper and right lower lobes appear new from 02/20/2022. 5 mm right upper lobe nodule (10/86), stable from 02/26/2021. Per Fleischner Society guidelines, no follow-up is necessary. Small bilateral pleural effusions. Dependent atelectasis in the left lower lobe. Findings make assessment for underlying interstitial lung disease difficult. Debris is seen in the airway. Upper Abdomen: Visualized portions of  the liver and right adrenal gland are unremarkable. 1.9 cm left adrenal nodule measures 3 Hounsfield units. No follow-up necessary. Subcentimeter low-attenuation lesion in the right kidney, too small to characterize. No specific follow-up necessary. Visualized portions of the kidneys, spleen, pancreas, stomach and bowel are otherwise unremarkable with the exception of a small hiatal hernia. Cholecystectomy. No upper abdominal adenopathy. Musculoskeletal: Degenerative changes in the spine. No worrisome lytic or sclerotic lesions. Old sternal fracture. IMPRESSION: 1. Patchy mid and lower lung zone ground-glass with areas of nodular consolidation in the right upper and right lower lobes. Findings are indicative of multilobar pneumonia. A component of aspiration is not excluded. 2. Acute pulmonary parenchymal findings and respiratory  motion preclude evaluation for interstitial lung disease. 3. Small pericardial effusion. 4. Small bilateral pleural effusions. 5. Left adrenal adenoma. 6.  Emphysema (ICD10-J43.9). 7. Aortic atherosclerosis (ICD10-I70.0). Coronary artery calcification. 8. Enlarged pulmonic trunk, indicative of pulmonary arterial hypertension. Electronically Signed   By: Lorin Picket M.D.   On: 04/15/2022 08:16   DG Chest Port 1 View  Result Date: 04/13/2022 CLINICAL DATA:  Cough SOB EXAM: PORTABLE CHEST 1 VIEW COMPARISON:  02/22/2022 FINDINGS: Diffuse pulmonary interstitial prominence consistent with pulmonary edema. Alveolar process mid to lower lung on the right consistent with pneumonia. No pneumothorax identified. Calcified aorta. Unremarkable cardiac silhouette. IMPRESSION: Interstitial edema versus interstitial lung disease and alveolar consolidation right mid to lower lung, likely pneumonia without interval change. Stability over fairly extended period of time suggests possible underlying process which would need to be excluded such as neoplasm. Electronically Signed   By: Sammie Bench M.D.   On: 04/13/2022 10:11     Discharge Exam: Vitals:   04/18/22 0853 04/18/22 0910  BP: 136/89   Pulse: 67   Resp: 20   Temp: 98 F (36.7 C)   SpO2: 95% 95%   Vitals:   04/18/22 0752 04/18/22 0822 04/18/22 0853 04/18/22 0910  BP: (!) 147/86 (!) 155/94 136/89   Pulse: 65 61 67   Resp: 16 (!) 27 20   Temp:   98 F (36.7 C)   TempSrc:   Oral   SpO2: 95% 95% 95% 95%  Weight:      Height:        General: Pt is alert, awake, not in acute distress Cardiovascular: RRR, S1/S2 +, no rubs, no gallops Respiratory: Bibasilar rhonchi. Abdominal: Soft, NT, ND, bowel sounds + Extremities: no edema, no cyanosis    The results of significant diagnostics from this hospitalization (including imaging, microbiology, ancillary and laboratory) are listed below for reference.     Microbiology: Recent Results (from the  past 240 hour(s))  Urine Culture     Status: Abnormal   Collection Time: 04/13/22  9:46 AM   Specimen: In/Out Cath Urine  Result Value Ref Range Status   Specimen Description IN/OUT CATH URINE  Final   Special Requests   Final    NONE Performed at [facility], [address] [place], [place] [address]    Culture MULTIPLE SPECIES PRESENT, SUGGEST RECOLLECTION (A)  Final   Report Status 04/14/2022 FINAL  Final  Culture, blood (Routine X 2) w Reflex to ID Panel     Status: None   Collection Time: 04/13/22 10:06 AM   Specimen: BLOOD  Result Value Ref Range Status   Specimen Description BLOOD RIGHT ANTECUBITAL  Final   Special Requests   Final    BOTTLES DRAWN AEROBIC AND ANAEROBIC Blood Culture adequate volume   Culture  Final    NO GROWTH 5 DAYS Performed at [facility], [place] [address] [place], [place] [address]    Report Status 04/18/2022 FINAL  Final  Resp Panel by RT-PCR (Flu A&B, Covid) Anterior Nasal Swab     Status: None   Collection Time: 04/13/22 10:40 AM   Specimen: Anterior Nasal Swab  Result Value Ref Range Status   SARS Coronavirus 2 by RT PCR NEGATIVE NEGATIVE Final    Comment: (NOTE) SARS-CoV-2 target nucleic acids are NOT DETECTED.  The SARS-CoV-2 RNA is generally detectable in upper respiratory specimens during the acute phase of infection. The lowest concentration of SARS-CoV-2 viral copies this assay can detect is 138 copies/mL. A negative result does not preclude SARS-Cov-2 infection and should not be used as the sole basis for treatment or other patient management decisions. A negative result may occur with  improper specimen collection/handling, submission of specimen other than nasopharyngeal swab, presence of viral mutation(s) within the areas targeted by this assay, and inadequate number of viral copies(<138 copies/mL). A negative result must be combined with clinical observations, patient history, and epidemiological information. The  expected result is Negative.  Fact Sheet for Patients:  EntrepreneurPulse.com.au  Fact Sheet for Healthcare Providers:  IncredibleEmployment.be  This test is no t yet approved or cleared by the [place] FDA and  has been authorized for detection and/or diagnosis of SARS-CoV-2 by FDA under an Emergency Use Authorization (EUA). This EUA will remain  in effect (meaning this test can be used) for the duration of the COVID-19 declaration under Section 564(b)(1) of the Act, 21 U.S.C.section 360bbb-3(b)(1), unless the authorization is terminated  or revoked sooner.       Influenza A by PCR NEGATIVE NEGATIVE Final   Influenza B by PCR NEGATIVE NEGATIVE Final    Comment: (NOTE) The Xpert Xpress SARS-CoV-2/FLU/RSV plus assay is intended as an aid in the diagnosis of influenza from Nasopharyngeal swab specimens and should not be used as a sole basis for treatment. Nasal washings and aspirates are unacceptable for Xpert Xpress SARS-CoV-2/FLU/RSV testing.  Fact Sheet for Patients: EntrepreneurPulse.com.au  Fact Sheet for Healthcare Providers: IncredibleEmployment.be  This test is not yet approved or cleared by the [place] FDA and has been authorized for detection and/or diagnosis of SARS-CoV-2 by FDA under an Emergency Use Authorization (EUA). This EUA will remain in effect (meaning this test can be used) for the duration of the COVID-19 declaration under Section 564(b)(1) of the Act, 21 U.S.C. section 360bbb-3(b)(1), unless the authorization is terminated or revoked.  Performed at [facility], [place] [address], [place], [place] [address]   Expectorated Sputum Assessment w Gram Stain, Rflx to Resp Cult     Status: None   Collection Time: 04/15/22  2:50 PM   Specimen: Sputum  Result Value Ref Range Status   Specimen Description SPU  Final   Special Requests Immunocompromised  Final   Sputum  evaluation   Final    THIS SPECIMEN IS ACCEPTABLE FOR SPUTUM CULTURE Performed at [facility], [address] [place], [place] [address]    Report Status 04/15/2022 FINAL  Final  Culture, Respiratory w Gram Stain     Status: None (Preliminary result)   Collection Time: 04/15/22  2:50 PM   Specimen: Sputum  Result Value Ref Range Status   Specimen Description SPU  Final   Special Requests Immunocompromised Reflexed from [id number]  Final   Gram Stain   Final    RARE  WBC PRESENT, PREDOMINANTLY PMN FEW GRAM POSITIVE COCCI    Culture   Final    FEW GRAM NEGATIVE RODS IDENTIFICATION AND SUSCEPTIBILITIES TO FOLLOW Performed at [facility], [place] [address], [place], [place] [address]    Report Status PENDING  Incomplete     Labs: BNP (last 3 results) Recent Labs    06/29/21 0114 08/01/21 1045 04/13/22 0940  BNP 45.9 81.5 46.6   Basic Metabolic Panel: Recent Labs  Lab 04/13/22 1132 04/13/22 1218 04/14/22 0400 04/15/22 0401 04/17/22 0545  NA 147* 141 144 140 139  K 3.2* 3.4* 4.1 3.7 3.7  CL 113*  --  112* 108 104  CO2 23  --  '26 25 30  '$ GLUCOSE 126*  --  118* 93 114*  BUN 17  --  '16 19 20  '$ CREATININE 0.85  --  0.74 0.84 0.91  CALCIUM 7.1*  --  7.7* 8.4* 8.8*  MG  --   --   --   --  1.9   Liver Function Tests: No results for input(s): "AST", "ALT", "ALKPHOS", "BILITOT", "PROT", "ALBUMIN" in the last 168 hours. No results for input(s): "LIPASE", "AMYLASE" in the last 168 hours. No results for input(s): "AMMONIA" in the last 168 hours. CBC: Recent Labs  Lab 04/13/22 0940 04/13/22 1218 04/14/22 0400 04/15/22 0401 04/16/22 0413 04/17/22 0339 04/18/22 0357  WBC 12.6*  --  14.9* 12.6* 13.4* 12.0* 12.2*  NEUTROABS 9.1*  --   --   --   --   --   --   HGB 12.5*   < > 9.5* 9.6* 10.9* 11.7* 11.3*  HCT 38.2*   < > 30.3* 30.2* 32.7* 37.6* 34.0*  MCV 103.5*  --  106.3* 105.2* 101.6* 105.6* 100.0  PLT 262  --  172 174 221 149* 237   < > = values in this interval  not displayed.   Cardiac Enzymes: No results for input(s): "CKTOTAL", "CKMB", "CKMBINDEX", "TROPONINI" in the last 168 hours. BNP: Invalid input(s): "POCBNP" CBG: No results for input(s): "GLUCAP" in the last 168 hours. D-Dimer No results for input(s): "DDIMER" in the last 72 hours. Hgb A1c No results for input(s): "HGBA1C" in the last 72 hours. Lipid Profile No results for input(s): "CHOL", "HDL", "LDLCALC", "TRIG", "CHOLHDL", "LDLDIRECT" in the last 72 hours. Thyroid function studies No results for input(s): "TSH", "T4TOTAL", "T3FREE", "THYROIDAB" in the last 72 hours.  Invalid input(s): "FREET3" Anemia work up No results for input(s): "VITAMINB12", "FOLATE", "FERRITIN", "TIBC", "IRON", "RETICCTPCT" in the last 72 hours. Urinalysis    Component Value Date/Time   COLORURINE AMBER (A) 04/13/2022 0946   APPEARANCEUR HAZY (A) 04/13/2022 0946   APPEARANCEUR Clear 10/26/2012 1721   LABSPEC 1.025 04/13/2022 0946   LABSPEC 1.021 10/26/2012 1721   PHURINE 5.0 04/13/2022 0946   GLUCOSEU NEGATIVE 04/13/2022 0946   GLUCOSEU Negative 10/26/2012 1721   HGBUR NEGATIVE 04/13/2022 0946   HGBUR negative 01/01/2008 0000   BILIRUBINUR NEGATIVE 04/13/2022 0946   BILIRUBINUR Negative 10/26/2012 1721   KETONESUR 5 (A) 04/13/2022 0946   PROTEINUR 30 (A) 04/13/2022 0946   UROBILINOGEN 1.0 08/29/2010 0041   NITRITE NEGATIVE 04/13/2022 0946   LEUKOCYTESUR MODERATE (A) 04/13/2022 0946   LEUKOCYTESUR 3+ 10/26/2012 1721   Sepsis Labs Recent Labs  Lab 04/15/22 0401 04/16/22 0413 04/17/22 0339 04/18/22 0357  WBC 12.6* 13.4* 12.0* 12.2*   Microbiology Recent Results (from the past 240 hour(s))  Urine Culture     Status: Abnormal   Collection Time:  04/13/22  9:46 AM   Specimen: In/Out Cath Urine  Result Value Ref Range Status   Specimen Description IN/OUT CATH URINE  Final   Special Requests   Final    NONE Performed at [facility], [address] [place], [place] [address]     Culture MULTIPLE SPECIES PRESENT, SUGGEST RECOLLECTION (A)  Final   Report Status 04/14/2022 FINAL  Final  Culture, blood (Routine X 2) w Reflex to ID Panel     Status: None   Collection Time: 04/13/22 10:06 AM   Specimen: BLOOD  Result Value Ref Range Status   Specimen Description BLOOD RIGHT ANTECUBITAL  Final   Special Requests   Final    BOTTLES DRAWN AEROBIC AND ANAEROBIC Blood Culture adequate volume   Culture   Final    NO GROWTH 5 DAYS Performed at [facility], [place] [address], [place], [place] [address]    Report Status 04/18/2022 FINAL  Final  Resp Panel by RT-PCR (Flu A&B, Covid) Anterior Nasal Swab     Status: None   Collection Time: 04/13/22 10:40 AM   Specimen: Anterior Nasal Swab  Result Value Ref Range Status   SARS Coronavirus 2 by RT PCR NEGATIVE NEGATIVE Final    Comment: (NOTE) SARS-CoV-2 target nucleic acids are NOT DETECTED.  The SARS-CoV-2 RNA is generally detectable in upper respiratory specimens during the acute phase of infection. The lowest concentration of SARS-CoV-2 viral copies this assay can detect is 138 copies/mL. A negative result does not preclude SARS-Cov-2 infection and should not be used as the sole basis for treatment or other patient management decisions. A negative result may occur with  improper specimen collection/handling, submission of specimen other than nasopharyngeal swab, presence of viral mutation(s) within the areas targeted by this assay, and inadequate number of viral copies(<138 copies/mL). A negative result must be combined with clinical observations, patient history, and epidemiological information. The expected result is Negative.  Fact Sheet for Patients:  EntrepreneurPulse.com.au  Fact Sheet for Healthcare Providers:  IncredibleEmployment.be  This test is no t yet approved or cleared by the [place] FDA and  has been authorized for detection and/or diagnosis of SARS-CoV-2  by FDA under an Emergency Use Authorization (EUA). This EUA will remain  in effect (meaning this test can be used) for the duration of the COVID-19 declaration under Section 564(b)(1) of the Act, 21 U.S.C.section 360bbb-3(b)(1), unless the authorization is terminated  or revoked sooner.       Influenza A by PCR NEGATIVE NEGATIVE Final   Influenza B by PCR NEGATIVE NEGATIVE Final    Comment: (NOTE) The Xpert Xpress SARS-CoV-2/FLU/RSV plus assay is intended as an aid in the diagnosis of influenza from Nasopharyngeal swab specimens and should not be used as a sole basis for treatment. Nasal washings and aspirates are unacceptable for Xpert Xpress SARS-CoV-2/FLU/RSV testing.  Fact Sheet for Patients: EntrepreneurPulse.com.au  Fact Sheet for Healthcare Providers: IncredibleEmployment.be  This test is not yet approved or cleared by the [place] FDA and has been authorized for detection and/or diagnosis of SARS-CoV-2 by FDA under an Emergency Use Authorization (EUA). This EUA will remain in effect (meaning this test can be used) for the duration of the COVID-19 declaration under Section 564(b)(1) of the Act, 21 U.S.C. section 360bbb-3(b)(1), unless the authorization is terminated or revoked.  Performed at [facility], [place] [address], [place], [place] [address]   Expectorated Sputum Assessment w Gram Stain, Rflx to Resp Cult  Status: None   Collection Time: 04/15/22  2:50 PM   Specimen: Sputum  Result Value Ref Range Status   Specimen Description SPU  Final   Special Requests Immunocompromised  Final   Sputum evaluation   Final    THIS SPECIMEN IS ACCEPTABLE FOR SPUTUM CULTURE Performed at [facility], [address], [place], [place] [address]    Report Status 04/15/2022 FINAL  Final  Culture, Respiratory w Gram Stain     Status: None (Preliminary result)   Collection Time: 04/15/22  2:50 PM   Specimen: Sputum  Result  Value Ref Range Status   Specimen Description SPU  Final   Special Requests Immunocompromised Reflexed from [id number]  Final   Gram Stain   Final    RARE WBC PRESENT, PREDOMINANTLY PMN FEW GRAM POSITIVE COCCI    Culture   Final    FEW GRAM NEGATIVE RODS IDENTIFICATION AND SUSCEPTIBILITIES TO FOLLOW Performed at [facility], [place] [address] [place], [place] [address]    Report Status PENDING  Incomplete     Time coordinating discharge: Over 30 minutes  SIGNED:   Darliss Cheney, MD  [facility] 04/18/2022, 10:29 AM *Please note that this is a verbal dictation therefore any spelling or grammatical errors are due to the "[place] One" system interpretation. If 7PM-7AM, please contact night-coverage [email or web address]

## 2022-04-18 NOTE — Progress Notes (Addendum)
ANTICOAGULATION CONSULT NOTE - Consult  Pharmacy Consult for warfarin Indication: atrial fibrillation  No Known Allergies  Patient Measurements: Height: '5\' 9"'$  (175.3 cm) Weight: 75.6 kg (166 lb 10.7 oz) IBW/kg (Calculated) : 70.7  Vital Signs: Temp: 98 F (36.7 C) (12/07 0853) Temp Source: Oral (12/07 0853) BP: 136/89 (12/07 0853) Pulse Rate: 67 (12/07 0853)  Labs: Recent Labs    04/16/22 0413 04/17/22 0339 04/17/22 0545 04/18/22 0357  HGB 10.9* 11.7*  --  11.3*  HCT 32.7* 37.6*  --  34.0*  PLT 221 149*  --  237  LABPROT 26.4* 26.5*  --  30.3*  INR 2.5* 2.5*  --  2.9*  CREATININE  --   --  0.91  --      Estimated Creatinine Clearance: 62.6 mL/min (by C-G formula based on SCr of 0.91 mg/dL).   Medical History: Past Medical History:  Diagnosis Date   Basal cell carcinoma 07/25/2015   L zygoma inf to lat canthus   Basal cell carcinoma 06/11/2018   L nasal ala   Basal cell carcinoma 10/04/2019   Left nose. Infiltrative pattern.   BPH (benign prostatic hyperplasia)    COPD (chronic obstructive pulmonary disease) (HCC)    Elevated liver enzymes    Emphysema of lung (HCC)    Gastric ulcer    GERD (gastroesophageal reflux disease)    Hepatitis B    Hypertension    Parkinson's disease    Prostate hypertrophy    Rotator cuff tear right   UTI (urinary tract infection)     Medications:  Medications Prior to Admission  Medication Sig Dispense Refill Last Dose   albuterol (VENTOLIN HFA) 108 (90 Base) MCG/ACT inhaler Inhale 2 puffs into the lungs every 6 (six) hours as needed for wheezing or shortness of breath. 18 g 1 unknown   ALPRAZolam (XANAX) 0.5 MG tablet Take 0.5 mg by mouth at bedtime as needed for anxiety or sleep.   unknown   amiodarone (PACERONE) 200 MG tablet Take 100 mg by mouth daily.   04/12/2022   budesonide-formoterol (SYMBICORT) 160-4.5 MCG/ACT inhaler Inhale 2 puffs into the lungs 2 (two) times daily. 10.2 g 3 04/12/2022   carbidopa-levodopa  (SINEMET IR) 25-100 MG tablet Take 2 tablets by mouth 5 (five) times daily. Takes at 0800, 1200, 1600, 2000, and 00:00 (midnight)   04/12/2022   Cyanocobalamin ([employer] VITAMIN B-12) 2000 MCG TBCR Take 2,000 mcg by mouth daily.   04/12/2022   ferrous gluconate (FERGON) 324 MG tablet Take 324 mg by mouth daily with breakfast.   04/12/2022   ipratropium-albuterol (DUONEB) 0.5-2.5 (3) MG/3ML SOLN Take 3 mLs by nebulization every 2 (two) hours as needed (ehzzeing / shortness of breath). 360 mL 0 unknown   metoprolol tartrate (LOPRESSOR) 25 MG tablet Take 1 tablet (25 mg total) by mouth 2 (two) times daily. 60 tablet 0 04/12/2022 at 2200   montelukast (SINGULAIR) 10 MG tablet Take 10 mg by mouth at bedtime.   04/12/2022   omeprazole (PRILOSEC) 40 MG capsule Take 40 mg by mouth daily.    04/12/2022   QUEtiapine (SEROQUEL) 50 MG tablet Take 50 mg by mouth at bedtime.   Past Month   roflumilast (DALIRESP) 500 MCG TABS tablet Take 500 mcg by mouth daily.   04/12/2022   SUMAtriptan (IMITREX) 100 MG tablet Take 100 mg by mouth every 2 (two) hours as needed for migraine.   unknown   warfarin (COUMADIN) 5 MG tablet Take 2.5 mg by mouth daily. Take  1/2 tablet (2.'5mg'$ ) every night   04/12/2022 at 2200   doxycycline (VIBRAMYCIN) 100 MG capsule Take 100 mg by mouth 2 (two) times daily.   hasn't started    Assessment: 81 yo M admitted for respiratory distress 2/2 CAP (per PCP on 04/12/22). Pt is a hospice patient. PMH includes esophageal dysphagia on soft/dysphagia 3 diet and PAF (on warfarin). Pharmacy consulted to manage warfarin for Afib.  Last dose PTA was 12/1.  PTA warfarin dose: 2.5 mg daily. Pt reports having warfarin '5mg'$  tablets - taking 1/2 tablet.   DDI: - Note: Has received antibiotics that can increase INR.     12/2 Azith 500 mg x 1, 12/3 Doxy >>12/6, 12/4 Flagyl 500 mg x 1 - Amiodarone PTA  INR is therapeutic today at 2.9 (up from 2.5), likely seeing some effects of Flagyl, azith, and doxy.  Goal of Therapy:   INR 2-3 Monitor platelets by anticoagulation protocol: Yes   Plan:  Warfarin 1 mg PO x 1 tonight Monitor daily CBC, INR, and for s/sx of bleeding Can consider checking INR q72 hours when stable    Thank you for allowing [place] to participate in this patients care. Jens Som, PharmD 04/18/2022 9:21 AM  **Pharmacist phone directory can be found on [email or web address] listed under [place]**

## 2022-04-18 NOTE — Care Management Important Message (Signed)
Important Message  Patient Details  Name: Patrick Montoya MRN: [id number] Date of Birth: 02-19-1941   Medicare Important Message Given:  Yes     Hannah Beat 04/18/2022, 11:28 AM

## 2022-04-18 NOTE — Plan of Care (Signed)

## 2022-04-18 NOTE — Progress Notes (Signed)
Mobility Specialist Progress Note   04/18/22 1140  Mobility  Activity Ambulated with assistance to bathroom;Ambulated with assistance in hallway  Level of Assistance Standby assist, set-up cues, supervision of patient - no hands on  Assistive Device Other (Comment) (IV pole)  Distance Ambulated (ft) 240 ft  Activity Response Tolerated well  $Mobility charge 1 Mobility   Pre Mobility: 65 HR, 115/65 BP, SpO2 3.5LO2 During Mobility: 121 HR, SpO2 on 4LO2 Post Mobility: 61 HR, 123/65 BP, 92% SpO2 on 3LO2  Received pt on 3.5LO2 in bed having no complaints and agreeable. Stand by assist for safety but no physical assist needed throughout. Pt floating around 80% SpO2 w/ an unclear pleth while on 3LO2, expressing no symptoms. Once seated, pt staying >91% SpO2 on a clear pleth. Able to make back to the BR w/o fault so pt could take a wash up. Once back in room pt having consistent bouts of Vtach but still expressing no complaints. RN notified and pt aware of call string in BR.   [place] Mobility Specialist Please contact via SecureChat or  Rehab office at [phone]

## 2022-07-27 ENCOUNTER — Inpatient Hospital Stay (HOSPITAL_COMMUNITY)
Admission: EM | Admit: 2022-07-27 | Discharge: 2022-07-29 | DRG: 193 | Disposition: A | Discharge status: Hospice | Attending: Internal Medicine | Admitting: Internal Medicine

## 2022-07-27 ENCOUNTER — Emergency Department (HOSPITAL_COMMUNITY)

## 2022-07-27 DIAGNOSIS — R1314 Dysphagia, pharyngoesophageal phase: Secondary | ICD-10-CM | POA: Diagnosis present

## 2022-07-27 DIAGNOSIS — J9621 Acute and chronic respiratory failure with hypoxia: Secondary | ICD-10-CM | POA: Diagnosis present

## 2022-07-27 DIAGNOSIS — K219 Gastro-esophageal reflux disease without esophagitis: Secondary | ICD-10-CM | POA: Diagnosis present

## 2022-07-27 DIAGNOSIS — Z66 Do not resuscitate: Secondary | ICD-10-CM | POA: Diagnosis present

## 2022-07-27 DIAGNOSIS — Z1152 Encounter for screening for COVID-19: Secondary | ICD-10-CM

## 2022-07-27 DIAGNOSIS — I480 Paroxysmal atrial fibrillation: Secondary | ICD-10-CM | POA: Diagnosis present

## 2022-07-27 DIAGNOSIS — Z7901 Long term (current) use of anticoagulants: Secondary | ICD-10-CM

## 2022-07-27 DIAGNOSIS — Z8701 Personal history of pneumonia (recurrent): Secondary | ICD-10-CM | POA: Diagnosis not present

## 2022-07-27 DIAGNOSIS — Z7189 Other specified counseling: Secondary | ICD-10-CM | POA: Diagnosis not present

## 2022-07-27 DIAGNOSIS — J440 Chronic obstructive pulmonary disease with acute lower respiratory infection: Secondary | ICD-10-CM | POA: Diagnosis present

## 2022-07-27 DIAGNOSIS — G20A1 Parkinson's disease without dyskinesia, without mention of fluctuations: Secondary | ICD-10-CM | POA: Diagnosis present

## 2022-07-27 DIAGNOSIS — J441 Chronic obstructive pulmonary disease with (acute) exacerbation: Secondary | ICD-10-CM | POA: Diagnosis present

## 2022-07-27 DIAGNOSIS — J449 Chronic obstructive pulmonary disease, unspecified: Secondary | ICD-10-CM | POA: Diagnosis not present

## 2022-07-27 DIAGNOSIS — J439 Emphysema, unspecified: Secondary | ICD-10-CM | POA: Diagnosis present

## 2022-07-27 DIAGNOSIS — Z79899 Other long term (current) drug therapy: Secondary | ICD-10-CM

## 2022-07-27 DIAGNOSIS — Z8711 Personal history of peptic ulcer disease: Secondary | ICD-10-CM | POA: Diagnosis not present

## 2022-07-27 DIAGNOSIS — Z87891 Personal history of nicotine dependence: Secondary | ICD-10-CM

## 2022-07-27 DIAGNOSIS — Z86010 Personal history of colonic polyps: Secondary | ICD-10-CM | POA: Diagnosis not present

## 2022-07-27 DIAGNOSIS — Z85828 Personal history of other malignant neoplasm of skin: Secondary | ICD-10-CM | POA: Diagnosis not present

## 2022-07-27 DIAGNOSIS — J9601 Acute respiratory failure with hypoxia: Principal | ICD-10-CM

## 2022-07-27 DIAGNOSIS — J189 Pneumonia, unspecified organism: Principal | ICD-10-CM | POA: Diagnosis present

## 2022-07-27 DIAGNOSIS — J9602 Acute respiratory failure with hypercapnia: Secondary | ICD-10-CM | POA: Diagnosis not present

## 2022-07-27 DIAGNOSIS — I5032 Chronic diastolic (congestive) heart failure: Secondary | ICD-10-CM | POA: Diagnosis present

## 2022-07-27 DIAGNOSIS — Z515 Encounter for palliative care: Secondary | ICD-10-CM | POA: Diagnosis not present

## 2022-07-27 DIAGNOSIS — N400 Enlarged prostate without lower urinary tract symptoms: Secondary | ICD-10-CM | POA: Diagnosis present

## 2022-07-27 DIAGNOSIS — Z789 Other specified health status: Secondary | ICD-10-CM | POA: Diagnosis not present

## 2022-07-27 DIAGNOSIS — Z711 Person with feared health complaint in whom no diagnosis is made: Secondary | ICD-10-CM | POA: Diagnosis not present

## 2022-07-27 DIAGNOSIS — I110 Hypertensive heart disease with heart failure: Secondary | ICD-10-CM | POA: Diagnosis present

## 2022-07-27 DIAGNOSIS — J9622 Acute and chronic respiratory failure with hypercapnia: Secondary | ICD-10-CM | POA: Diagnosis present

## 2022-07-27 DIAGNOSIS — Z7951 Long term (current) use of inhaled steroids: Secondary | ICD-10-CM | POA: Diagnosis not present

## 2022-07-27 LAB — BLOOD GAS, VENOUS
Acid-Base Excess: 11 mmol/L — ABNORMAL HIGH (ref 0.0–2.0)
Bicarbonate: 39.5 mmol/L — ABNORMAL HIGH (ref 20.0–28.0)
Drawn by: 7341
O2 Saturation: 73 %
Patient temperature: 36.6
pCO2, Ven: 69 mmHg — ABNORMAL HIGH (ref 44–60)
pH, Ven: 7.37 (ref 7.25–7.43)
pO2, Ven: 43 mmHg (ref 32–45)

## 2022-07-27 LAB — COMPREHENSIVE METABOLIC PANEL
ALT: 16 U/L (ref 0–44)
AST: 35 U/L (ref 15–41)
Albumin: 3.3 g/dL — ABNORMAL LOW (ref 3.5–5.0)
Alkaline Phosphatase: 56 U/L (ref 38–126)
Anion gap: 11 (ref 5–15)
BUN: 15 mg/dL (ref 8–23)
CO2: 27 mmol/L (ref 22–32)
Calcium: 8.7 mg/dL — ABNORMAL LOW (ref 8.9–10.3)
Chloride: 103 mmol/L (ref 98–111)
Creatinine, Ser: 0.89 mg/dL (ref 0.61–1.24)
GFR, Estimated: >60 mL/min (ref >60)
Glucose, Bld: 108 mg/dL — ABNORMAL HIGH (ref 70–99)
Potassium: 3.8 mmol/L (ref 3.5–5.1)
Sodium: 141 mmol/L (ref 135–145)
Total Bilirubin: 0.9 mg/dL (ref 0.3–1.2)
Total Protein: 6 g/dL — ABNORMAL LOW (ref 6.5–8.1)

## 2022-07-27 LAB — I-STAT VENOUS BLOOD GAS, ED
Acid-Base Excess: 9 mmol/L — ABNORMAL HIGH (ref 0.0–2.0)
Bicarbonate: 36.1 mmol/L — ABNORMAL HIGH (ref 20.0–28.0)
Calcium, Ion: 1.16 mmol/L (ref 1.15–1.40)
HCT: 34 % — ABNORMAL LOW (ref 39.0–52.0)
Hemoglobin: 11.6 g/dL — ABNORMAL LOW (ref 13.0–17.0)
O2 Saturation: 96 %
Potassium: 3.9 mmol/L (ref 3.5–5.1)
Sodium: 144 mmol/L (ref 135–145)
TCO2: 38 mmol/L — ABNORMAL HIGH (ref 22–32)
pCO2, Ven: 61.6 mmHg — ABNORMAL HIGH (ref 44–60)
pH, Ven: 7.376 (ref 7.25–7.43)
pO2, Ven: 89 mmHg — ABNORMAL HIGH (ref 32–45)

## 2022-07-27 LAB — IRON AND TIBC
Iron: 15 ug/dL — ABNORMAL LOW (ref 45–182)
Saturation Ratios: 4 % — ABNORMAL LOW (ref 17.9–39.5)
TIBC: 368 ug/dL (ref 250–450)
UIBC: 353 ug/dL

## 2022-07-27 LAB — CBC
HCT: 34.8 % — ABNORMAL LOW (ref 39.0–52.0)
Hemoglobin: 9.9 g/dL — ABNORMAL LOW (ref 13.0–17.0)
MCH: 31.4 pg (ref 26.0–34.0)
MCHC: 28.4 g/dL — ABNORMAL LOW (ref 30.0–36.0)
MCV: 110.5 fL — ABNORMAL HIGH (ref 80.0–100.0)
Platelets: 187 10*3/uL (ref 150–400)
RBC: 3.15 MIL/uL — ABNORMAL LOW (ref 4.22–5.81)
RDW: 14.5 % (ref 11.5–15.5)
WBC: 9.5 10*3/uL (ref 4.0–10.5)
nRBC: 0 % (ref 0.0–0.2)

## 2022-07-27 LAB — PROCALCITONIN: Procalcitonin: <0.1 ng/mL

## 2022-07-27 LAB — FERRITIN: Ferritin: 75 ng/mL (ref 24–336)

## 2022-07-27 LAB — RESP PANEL BY RT-PCR (RSV, FLU A&B, COVID)  RVPGX2
Influenza A by PCR: NEGATIVE
Influenza B by PCR: NEGATIVE
Resp Syncytial Virus by PCR: NEGATIVE
SARS Coronavirus 2 by RT PCR: NEGATIVE

## 2022-07-27 LAB — MRSA NEXT GEN BY PCR, NASAL: MRSA by PCR Next Gen: NOT DETECTED

## 2022-07-27 LAB — VITAMIN B12: Vitamin B-12: 1765 pg/mL — ABNORMAL HIGH (ref 180–914)

## 2022-07-27 LAB — PROTIME-INR
INR: 1.2 (ref 0.8–1.2)
Prothrombin Time: 14.8 seconds (ref 11.4–15.2)

## 2022-07-27 LAB — BRAIN NATRIURETIC PEPTIDE: B Natriuretic Peptide: 91.7 pg/mL (ref 0.0–100.0)

## 2022-07-27 LAB — D-DIMER, QUANTITATIVE: D-Dimer, Quant: 0.58 ug/mL-FEU — ABNORMAL HIGH (ref 0.00–0.50)

## 2022-07-27 LAB — STREP PNEUMONIAE URINARY ANTIGEN: Strep Pneumo Urinary Antigen: NEGATIVE

## 2022-07-27 LAB — MAGNESIUM: Magnesium: 2.2 mg/dL (ref 1.7–2.4)

## 2022-07-27 LAB — FOLATE: Folate: 10.1 ng/mL (ref >5.9)

## 2022-07-27 LAB — LACTIC ACID, PLASMA: Lactic Acid, Venous: 1 mmol/L (ref 0.5–1.9)

## 2022-07-27 MED ORDER — ONDANSETRON HCL 4 MG PO TABS: 4.0000 mg | ORAL_TABLET | Freq: Four times a day (QID) | ORAL | Status: DC | PRN

## 2022-07-27 MED ORDER — PREDNISONE 20 MG PO TABS
40.0000 mg | ORAL_TABLET | Freq: Every day | ORAL | Status: DC
  Administered 2022-07-28 – 2022-07-29 (×2): 40 mg via ORAL
  Filled 2022-07-27 (×2): qty 2

## 2022-07-27 MED ORDER — IPRATROPIUM-ALBUTEROL 0.5-2.5 (3) MG/3ML IN SOLN: 3.0000 mL | RESPIRATORY_TRACT | Status: DC | PRN

## 2022-07-27 MED ORDER — SODIUM CHLORIDE 0.9 % IV SOLN
500.0000 mg | INTRAVENOUS | Status: DC
  Administered 2022-07-28: 500 mg via INTRAVENOUS
  Filled 2022-07-27: qty 5

## 2022-07-27 MED ORDER — ONDANSETRON HCL 4 MG/2ML IJ SOLN: 4.0000 mg | Freq: Four times a day (QID) | INTRAMUSCULAR | Status: DC | PRN

## 2022-07-27 MED ORDER — GLYCOPYRROLATE 0.2 MG/ML IJ SOLN: 0.2000 mg | INTRAMUSCULAR | Status: DC | PRN

## 2022-07-27 MED ORDER — SODIUM CHLORIDE 0.9 % IV SOLN
500.0000 mg | Freq: Once | INTRAVENOUS | Status: AC
  Administered 2022-07-27: 500 mg via INTRAVENOUS
  Filled 2022-07-27: qty 5

## 2022-07-27 MED ORDER — METOPROLOL TARTRATE 25 MG PO TABS
25.0000 mg | ORAL_TABLET | Freq: Two times a day (BID) | ORAL | Status: DC
  Administered 2022-07-27 – 2022-07-29 (×5): 25 mg via ORAL
  Filled 2022-07-27 (×5): qty 1

## 2022-07-27 MED ORDER — IPRATROPIUM BROMIDE 0.02 % IN SOLN
0.5000 mg | Freq: Once | RESPIRATORY_TRACT | Status: AC
  Administered 2022-07-27: 0.5 mg via RESPIRATORY_TRACT
  Filled 2022-07-27: qty 2.5

## 2022-07-27 MED ORDER — METRONIDAZOLE 500 MG/100ML IV SOLN
500.0000 mg | Freq: Three times a day (TID) | INTRAVENOUS | Status: DC
  Administered 2022-07-27: 500 mg via INTRAVENOUS
  Filled 2022-07-27: qty 100

## 2022-07-27 MED ORDER — AMIODARONE HCL 200 MG PO TABS
100.0000 mg | ORAL_TABLET | Freq: Every day | ORAL | Status: DC
  Administered 2022-07-27 – 2022-07-29 (×3): 100 mg via ORAL
  Filled 2022-07-27 (×3): qty 1

## 2022-07-27 MED ORDER — FUROSEMIDE 10 MG/ML IJ SOLN
40.0000 mg | Freq: Once | INTRAMUSCULAR | Status: AC
  Administered 2022-07-27: 40 mg via INTRAVENOUS
  Filled 2022-07-27: qty 4

## 2022-07-27 MED ORDER — ACETAMINOPHEN 325 MG PO TABS
650.0000 mg | ORAL_TABLET | Freq: Four times a day (QID) | ORAL | Status: DC | PRN
  Administered 2022-07-28: 650 mg via ORAL
  Filled 2022-07-27: qty 2

## 2022-07-27 MED ORDER — SODIUM CHLORIDE 0.9 % IV SOLN
1.0000 g | INTRAVENOUS | Status: DC
  Administered 2022-07-28 – 2022-07-29 (×2): 1 g via INTRAVENOUS
  Filled 2022-07-27: qty 10

## 2022-07-27 MED ORDER — LEVALBUTEROL HCL 1.25 MG/0.5ML IN NEBU: 1.2500 mg | INHALATION_SOLUTION | Freq: Once | RESPIRATORY_TRACT | Status: AC

## 2022-07-27 MED ORDER — ALBUTEROL SULFATE (2.5 MG/3ML) 0.083% IN NEBU
10.0000 mg/h | INHALATION_SOLUTION | RESPIRATORY_TRACT | Status: DC
  Filled 2022-07-27: qty 12

## 2022-07-27 MED ORDER — SODIUM CHLORIDE 0.9 % IV SOLN
1.0000 g | Freq: Once | INTRAVENOUS | Status: AC
  Administered 2022-07-27: 1 g via INTRAVENOUS
  Filled 2022-07-27: qty 10

## 2022-07-27 MED ORDER — ALPRAZOLAM 0.25 MG PO TABS
0.2500 mg | ORAL_TABLET | Freq: Every evening | ORAL | Status: DC | PRN
  Administered 2022-07-27 – 2022-07-28 (×2): 0.25 mg via ORAL
  Filled 2022-07-27 (×2): qty 1

## 2022-07-27 MED ORDER — FLUTICASONE FUROATE-VILANTEROL 200-25 MCG/ACT IN AEPB
1.0000 | INHALATION_SPRAY | Freq: Every day | RESPIRATORY_TRACT | Status: DC
  Administered 2022-07-28 – 2022-07-29 (×2): 1 via RESPIRATORY_TRACT
  Filled 2022-07-27: qty 28

## 2022-07-27 MED ORDER — PANTOPRAZOLE SODIUM 40 MG IV SOLR
40.0000 mg | INTRAVENOUS | Status: DC
  Administered 2022-07-27 – 2022-07-29 (×3): 40 mg via INTRAVENOUS
  Filled 2022-07-27 (×3): qty 10

## 2022-07-27 MED ORDER — QUETIAPINE FUMARATE 25 MG PO TABS
25.0000 mg | ORAL_TABLET | Freq: Every day | ORAL | Status: DC
  Administered 2022-07-27 – 2022-07-28 (×2): 25 mg via ORAL
  Filled 2022-07-27 (×2): qty 1

## 2022-07-27 MED ORDER — LEVALBUTEROL HCL 1.25 MG/0.5ML IN NEBU
INHALATION_SOLUTION | RESPIRATORY_TRACT | Status: AC
  Administered 2022-07-27: 1.25 mg via RESPIRATORY_TRACT
  Filled 2022-07-27: qty 0.5

## 2022-07-27 MED ORDER — ENOXAPARIN SODIUM 40 MG/0.4ML IJ SOSY: 40.0000 mg | PREFILLED_SYRINGE | INTRAMUSCULAR | Status: DC

## 2022-07-27 MED ORDER — ENOXAPARIN SODIUM 80 MG/0.8ML IJ SOSY
1.0000 mg/kg | PREFILLED_SYRINGE | Freq: Two times a day (BID) | INTRAMUSCULAR | Status: DC
  Administered 2022-07-27 – 2022-07-28 (×2): 75 mg via SUBCUTANEOUS
  Filled 2022-07-27: qty 0.75
  Filled 2022-07-27 (×2): qty 0.8

## 2022-07-27 MED ORDER — CARBIDOPA-LEVODOPA 25-100 MG PO TABS
2.0000 | ORAL_TABLET | Freq: Every day | ORAL | Status: DC
  Administered 2022-07-27 – 2022-07-29 (×11): 2 via ORAL
  Filled 2022-07-27 (×14): qty 2

## 2022-07-27 MED ORDER — ACETAMINOPHEN 650 MG RE SUPP: 650.0000 mg | Freq: Four times a day (QID) | RECTAL | Status: DC | PRN

## 2022-07-27 MED ORDER — METHYLPREDNISOLONE SODIUM SUCC 125 MG IJ SOLR
125.0000 mg | Freq: Once | INTRAMUSCULAR | Status: AC
  Administered 2022-07-27: 125 mg via INTRAVENOUS
  Filled 2022-07-27: qty 2

## 2022-07-27 NOTE — Progress Notes (Signed)
RT placed Pt back on BiPAP due to desaturation in the 70's and increase SOB. Pt is now back on BiPAP and is VSS at this time.

## 2022-07-27 NOTE — Consult Note (Signed)
Consultation Note Date: 07/27/2022   Patient Name: Patrick Montoya  DOB: Jun 10, 1940  MRN: [id number]  Age / Sex: 82 y.o., male  PCP: Tracie Harrier, MD Referring Physician: Charise Killian, MD  Reason for Consultation: Establishing goals of care  HPI/Patient Profile: 82 y.o. male  with past medical history of COPD, HFpEF, chronic respiratory failure on 2L of O2, PUD on ppi, pAfib on coumadin, recurrent multifocal pneumonia in the last 2 years, and esophageal dysphagia admitted on 07/27/2022 with respiratory distress and cough.   Patient is a hospice patient, currently admitted for pneumonia, COPD exacerbation, and acute on chronic respiratory failure. PMT has been consulted to assist with goals of care conversation.  Clinical Assessment and Goals of Care:  I have reviewed medical records including EPIC notes, labs and imaging, received report from RN, assessed the patient and then met at the bedside to discuss diagnosis prognosis, GOC, EOL wishes, disposition and options.  I introduced Palliative Medicine as specialized medical care for people living with serious illness. It focuses on providing relief from the symptoms and stress of a serious illness. The goal is to improve quality of life for both the patient and the family.  We discussed a brief life review of the patient and then focused on their current illness.  The natural disease trajectory and expectations at EOL were discussed.  I attempted to elicit values and goals of care important to the patient.    Medical History Review and Understanding:  Patient reports a good understanding of the severity of his illness and his current care plan.  We reviewed his chronic comorbidities and the past year of his worsening health.  Social History: Patient has been married for 45 years.  He has 3 sons 1 of which lives locally, 1 in [place], and 1 in [place].  He  lives at home with his wife who is also on hospice.  They are able to afford transportation medications through a grant program.  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  He has enjoyed traveling the world.   Functional and Nutritional State: Patient was able to go to the grocery store until about 2 months ago, but now he uses delivery services.  He occasionally uses a walker.  He is no longer driving. Albumin of 3.3 noted on 3/16.  Palliative Symptoms: Dyspnea, cough, congestion, excessive secretions  Advance Directives: A detailed discussion regarding advanced directives was had.  AD documentation in [place] was reviewed.  Primary HCPOA is Patrick Montoya and secondary is Patrick Montoya.  Code Status: Concepts specific to code status, artifical feeding and hydration, and rehospitalization were considered and discussed.  Patient confirms desire for DNR.  He is willing to consider short-term trial of intubation if indicated.  Discussion: Patient shares that last night he felt so bad, he thought he would die.  He tells me he has been on hospice for the past 6 months and just recertified.  He enjoys the assistance from hospice and plans to continue their services after discharge.  He has had several conversations with his wife and sons about his end-of-life wishes.  His goal is to return home to live with his wife and resume hospice as long as he can.  He takes pride in the fact that he has lived in his current house for 31 years.   Shared my concern about patients in his condition who have have been on the ventilator, as evidenced based research has  shown similar patients are very difficult to wean from the ventilator.  Counseled that he would be at high risk for a prolonged recovery period with potential for more complications if he were to ever wean off the ventilator.  We discussed high likelihood of SNF placement after being on the ventilator rather than return home  which is his goal.  He would be open to SNF placement temporarily, but will continue to reflect on his decision to ensure he finds all of the potential consequences acceptable to him.  He feels that he has strong lungs do to the large amount of exposures to toxins he has already tolerated (asbestos at work, 60 years of smoking, etc.).   Discussed the importance of continued conversation with family and the medical providers regarding overall plan of care and treatment options, ensuring decisions are within the context of the patient's values and GOCs.   Questions and concerns were addressed.  Hard Choices booklet left for review. The family was encouraged to call with questions or concerns.  PMT will continue to support holistically.   SUMMARY OF RECOMMENDATIONS   -Continue DNR; updated CODE STATUS to reflect patient's desire for short-term trial of mechanical ventilation if indicated -Continue current care -Ongoing GOC discussions -Patient's goal is to return home and continue with hospice services -Psychosocial and emotional support provided -PMT will continue to follow and support   Prognosis:  < 6 months  Discharge Planning: Home with Hospice      Primary Diagnoses: Present on Admission:  Acute hypoxemic respiratory failure ([name])   Physical Exam Vitals and nursing note reviewed.  Constitutional:      General: He is not in acute distress.    Appearance: He is ill-appearing.     Interventions: Nasal cannula in place.     Comments: 4L  Cardiovascular:     Rate and Rhythm: Normal rate.  Pulmonary:     Effort: Tachypnea present. No respiratory distress.  Skin:    General: Skin is warm and dry.  Neurological:     Mental Status: He is alert.  Psychiatric:        Mood and Affect: Mood normal.        Behavior: Behavior normal.    Vital Signs: BP 107/67   Pulse 73   Temp 99.3 F (37.4 C)   Resp (!) 31   Ht 5\' 9"  (1.753 m)   Wt 75.6 kg   SpO2 96%   BMI 24.61 kg/m   Pain Scale: 0-10   Pain Score: Asleep   SpO2: SpO2: 96 % O2 Device:SpO2: 96 % O2 Flow Rate: .    Palliative Assessment/Data: 60%      Total time: I spent 90 minutes in the care of the patient today in the above activities and documenting the encounter.  MDM: High   Kyliee Ortego Johnnette Litter, PA-C  Palliative Medicine Team Team phone # [phone]  Thank you for allowing the Palliative Medicine Team to assist in the care of this patient. Please utilize secure chat with additional questions, if there is no response within 30 minutes please call the above phone number.  Palliative Medicine Team providers are available by phone from 7am to 7pm daily and can be reached through the team cell phone.  Should this patient require assistance outside of these hours, please call the patient's attending physician.

## 2022-07-27 NOTE — Progress Notes (Signed)
Pt was transitioned from BiPAP to [place] and is tolerating it well. Placed on 4L [place] ( home settings) and is currently stable at this time. BiPAP at bedside if needed.RT will monitor as needed.

## 2022-07-27 NOTE — Hospital Course (Addendum)
[name] is an 82 yo M with PMH of COPD, HFpEF, chronic respiratory failure on 2L of O2, PUD on ppi, pAfib on coumadin, recurrent multifocal pneumonia in the last 2 years, and esophageal dysphagia on mechanical soft diet dysphagia 3 who presented  to the emergency room due to respiratory distress.  Patient unable to give much history as O2 sats in the low 80s and bipap had to be replaced.    From EHR, patient arrived from home d/t dyspnea. His O2 sats were 80%. He was placed on cpap and given 2g of magnesium.   Prior to replacing his Bipap patient stated that 2 days prior to presenting to the hospital he was started on doxycycline by his PCP. He has had worsening cough over the past couple of days.   Per patient's wife (who was reached via phone), for the last 2-3 weeks patient has been coughing and spitting up a lot. She states the night prior to admission he felt "real bad". She placed her smart watch on patient and his heart rate was noted to be in the 100s, he had a low oxygen level, and noted that he could not breathe.   She attempted to call [facility] as both she and the patient are on hospice care but was unable to get in touch with them. The patient subsequently called the ambulance and was brought in to the hospital.    In the ED, patient noted to have elevated pCO2 on VBG and persistent poor oxygenation. He was started on bipap, given IV solumedrol and levalbuterol/ipratropium. Labs were notable for no leukocytosis, elevated BNP, elevated ddimer (when corrected for his age, VTE unlikely) and negative covid/flu. CXR w/ bilateral opacities L>R and emphysema.      Coughing spitting up a whole lot. Constantly. This has been going on for 2-3 weeks. Lst night felt real bad. Used smart watch heart racing 100. Low oxygen level. Could not breathe.   Last time in 02/2022, not sure why frequent pna. Wife tried to call hospice (kept asking for code). Patient called rescue himself.     Both patient and wife in hospice care.

## 2022-07-27 NOTE — ED Triage Notes (Signed)
Patient arrives via ems from home due to SOB , SPO2 80% when ems arrived. Placed on cpap and administered 2 g magnesium. Hx copd

## 2022-07-27 NOTE — H&P (Cosign Needed Addendum)
Date: 07/27/2022               Patient Name:  Patrick Montoya MRN: [id number]  DOB: August 31, 1940 Age / Sex: 82 y.o., male   PCP: Tracie Harrier, MD         Medical Service: Internal Medicine Teaching Service         Attending Physician: Dr. Charise Killian, MD    First Contact: Dr. Nani Gasser  Pager: [phone]  Second Contact: Dr. Christiana Fuchs Pager: [phone]       After Hours (After 5p/  First Contact Pager: [phone]  weekends / holidays): Second Contact Pager: [phone]   Chief Complaint: SOB  History of Present Illness:  Patrick Montoya is an 82 yo M with PMH of COPD, HFpEF, chronic respiratory failure on 2L of O2, PUD on ppi, pAfib on coumadin, recurrent multifocal pneumonia in the last 2 years, and esophageal dysphagia on mechanical soft diet dysphagia 3 who presented  to the emergency room due to respiratory distress.  Patient unable to give much history as O2 sats in the low 80s and bipap had to be replaced.   From EHR, patient arrived from home d/t dyspnea. His O2 sats were 80%. He was placed on cpap and given 2g of magnesium.   Prior to replacing his Bipap patient stated that 2 days prior to presenting to the hospital he was started on doxycycline by his PCP. He has had worsening cough over the past couple of days.   Per patient's wife (who was reached via phone), for the last 2-3 weeks patient has been coughing and spitting up a lot. She states the night prior to admission he felt "real bad". She placed her smart watch on patient and his heart rate was noted to be in the 100s, he had a low oxygen level, and noted that he could not breathe.   She attempted to call [facility] as both she and the patient are on hospice care but was unable to get in touch with them. The patient subsequently called the ambulance and was brought in to the hospital.    In the ED, patient noted to have elevated pCO2 on VBG and persistent poor oxygenation. He was started on bipap, given IV  solumedrol and levalbuterol/ipratropium. Labs were notable for no leukocytosis, nl BNP, elevated ddimer (when corrected for his age, VTE unlikely) and negative covid/flu. CXR w/ bilateral opacities L>R and emphysema.   Meds:  No current facility-administered medications on file prior to encounter.   Current Outpatient Medications on File Prior to Encounter  Medication Sig Dispense Refill   albuterol (VENTOLIN HFA) 108 (90 Base) MCG/ACT inhaler Inhale 2 puffs into the lungs every 6 (six) hours as needed for wheezing or shortness of breath. 18 g 1   ALPRAZolam (XANAX) 0.5 MG tablet Take 0.5 mg by mouth at bedtime as needed for anxiety or sleep.     amiodarone (PACERONE) 200 MG tablet Take 100 mg by mouth daily.     budesonide-formoterol (SYMBICORT) 160-4.5 MCG/ACT inhaler Inhale 2 puffs into the lungs 2 (two) times daily. 10.2 g 3   carbidopa-levodopa (SINEMET IR) 25-100 MG tablet Take 2 tablets by mouth 5 (five) times daily. Takes at 0800, 1200, 1600, 2000, and 00:00 (midnight)     Cyanocobalamin ([employer] VITAMIN B-12) 2000 MCG TBCR Take 2,000 mcg by mouth daily.     ferrous gluconate (FERGON) 324 MG tablet Take 324 mg by mouth daily with breakfast.  ipratropium-albuterol (DUONEB) 0.5-2.5 (3) MG/3ML SOLN Take 3 mLs by nebulization every 2 (two) hours as needed (ehzzeing / shortness of breath). 360 mL 0   metoprolol tartrate (LOPRESSOR) 25 MG tablet Take 1 tablet (25 mg total) by mouth 2 (two) times daily. 60 tablet 0   montelukast (SINGULAIR) 10 MG tablet Take 10 mg by mouth at bedtime.     omeprazole (PRILOSEC) 40 MG capsule Take 40 mg by mouth daily.      QUEtiapine (SEROQUEL) 50 MG tablet Take 50 mg by mouth at bedtime.     roflumilast (DALIRESP) 500 MCG TABS tablet Take 500 mcg by mouth daily.     SUMAtriptan (IMITREX) 100 MG tablet Take 100 mg by mouth every 2 (two) hours as needed for migraine.     warfarin (COUMADIN) 5 MG tablet Take 2.5 mg by mouth daily. Take 1/2 tablet (2.5mg ) every  night        Allergies: Allergies as of 07/27/2022   (No Known Allergies)   Past Medical History:  Diagnosis Date   Basal cell carcinoma 07/25/2015   L zygoma inf to lat canthus   Basal cell carcinoma 06/11/2018   L nasal ala   Basal cell carcinoma 10/04/2019   Left nose. Infiltrative pattern.   BPH (benign prostatic hyperplasia)    COPD (chronic obstructive pulmonary disease) (HCC)    Elevated liver enzymes    Emphysema of lung (HCC)    Gastric ulcer    GERD (gastroesophageal reflux disease)    Hepatitis B    Hypertension    Parkinson's disease    Prostate hypertrophy    Rotator cuff tear right   UTI (urinary tract infection)     Family History:  Family History  Problem Relation Age of Onset   Colon cancer Mother    Hepatitis Son    Prostate cancer Neg Hx    Kidney cancer Neg Hx      Social History:  Social History   Socioeconomic History   Marital status: Married    Spouse name: Not on file   Number of children: Not on file   Years of education: Not on file   Highest education level: Not on file  Occupational History   Not on file  Tobacco Use   Smoking status: Former    Packs/day: 1.00    Years: 62.00    Additional pack years: 0.00    Total pack years: 62.00    Types: Cigarettes   Smokeless tobacco: Never  Vaping Use   Vaping Use: Never used  Substance and Sexual Activity   Alcohol use: Yes    Comment: occasional   Drug use: No   Sexual activity: Not on file  Other Topics Concern   Not on file  Social History Narrative   Not on file   Social Determinants of Health   Financial Resource Strain: Not on file  Food Insecurity: No Food Insecurity (04/15/2022)   Hunger Vital Sign    Worried About Running Out of Food in the Last Year: Never true    Ran Out of Food in the Last Year: Never true  Transportation Needs: No Transportation Needs (04/15/2022)   PRAPARE - Hydrologist (Medical): No    Lack of Transportation  (Non-Medical): No  Physical Activity: Not on file  Stress: Not on file  Social Connections: Not on file  Intimate Partner Violence: Not At Risk (04/15/2022)   Humiliation, Afraid, Rape, and Kick questionnaire  Fear of Current or Ex-Partner: No    Emotionally Abused: No    Physically Abused: No    Sexually Abused: No     Review of Systems: A complete ROS was negative except as per HPI. Unable to assess to due respiratory status  Physical Exam: Blood pressure 117/69, pulse 77, temperature 99.3 F (37.4 C), resp. rate (!) 26, height 5\' 9"  (1.753 m), weight 75.6 kg, SpO2 92 %.  Constitutional: Chronically ill appearing and in no distress.  Cardiovascular: Normal rate, regular rhythm, intact distal pulses. No gallop and no friction rub.  No murmur heard. No lower extremity edema  Pulmonary: Non labored breathing on Bipap, no wheezing or rales (auscultation anteriorly) Abdominal: Soft. Normal bowel sounds. Non distended and non tender Musculoskeletal: Normal range of motion.        General: No tenderness or edema.  Neurological: Alert and oriented to person, place, and time. Non focal  Skin: Skin is warm and dry.    EKG: personally reviewed my interpretation is sinus, no STE or t wave changes   CXR: personally reviewed my interpretation is opacities in the bilateral lobes.   Assessment & Plan by Problem: Principal Problem:   Acute hypoxemic respiratory failure [facility])  Patrick Montoya is an 82 yo M with PMH of COPD, HFpEF, chronic hypoxemic respiratory failure on 2L of O2, PUD on ppi, pAfib on coumadin, recurrent multifocal pneumonia in the last 2 years, and esophageal dysphagia on mechanical soft diet dysphagia 3 who presented  to the emergency room due to respiratory distress.  #Acute on chronic hypoxemic and hypercapnic RF #COPD exacerbation  #PNA (recurrent) Patient with increased coughing and sputum production over the past couple of weeks and worsening dyspnea. He was  started on doxycycline by his PCP 2 days prior to admission. He presented to the ED with O2 sats in the low 80s, no leukocytosis, VBG w/ pco2 in the 60s. CXR w/ bilateral opacities at the bases. His dyspnea did improve with nebs and bipap. Attempt at pausing bipap was mmade multiple times and patient had desaturations to 83%.   Patient is DNR and currently on hospice care. He does state that he would not like CPR, but at this time he would like to proceed with intubation if his pneumonia/COPD exacerbation does not improve with antibiotics, steroids, and nebs.  -Patient has a history of recurrent PNA, noted that he had dysphagia in the chart, started patient on CAP coverage +flagyl for anaerobic  -F/u urine legionella and strep pneumo  -Continue duonebs, continue bipap for now, and see if patient can get break from bipap in the afternoon to eat and take PO medications -[facility] hospice called, and their inpatient team will follow along    #pAFib  On coumadin (DOAC unaffordable), amiodarone, and metoprolol.  -Continue metoprolol amio and coumadin   #HFpEF Appears euvolemic on exam, possibly signs of pulmonary edema on CXR.  -Give IV lasix 40mg  x1   #parkinsons dz  #H/o esophageal dysphagia  On mechanical soft diet/dysphagia 3. On sinimet as well.   #h/o PUD  Continue PPI    Dispo: Admit patient to Inpatient with expected length of stay greater than 2 midnights.  Signed: Rick Duff, MD 07/27/2022, 8:42 AM   After 5pm on weekdays and 1pm on weekends: On Call pager: [phone]

## 2022-07-27 NOTE — ED Provider Notes (Signed)
[facility] Provider Note  CSN: [id number] Arrival date & time: 07/27/22 0548  Chief Complaint(s) Shortness of Breath ED Triage Notes Rosaland Lao, RN (Registered Nurse)  Emergency Medicine  Date of Service: 07/27/2022  5:49 AM  Signed Patient arrives via ems from home due to SOB , SPO2 80% when ems arrived. Placed on cpap and administered 2 g magnesium. Hx copd     HPI Patrick Montoya is a 82 y.o. male with a past medical history listed below who presented to the emergency department with respiratory distress requiring CPAP.  Due to respiratory distress, patient unable to provide any history at this time.  HPI  Past Medical History Past Medical History:  Diagnosis Date   Basal cell carcinoma 07/25/2015   L zygoma inf to lat canthus   Basal cell carcinoma 06/11/2018   L nasal ala   Basal cell carcinoma 10/04/2019   Left nose. Infiltrative pattern.   BPH (benign prostatic hyperplasia)    COPD (chronic obstructive pulmonary disease) (HCC)    Elevated liver enzymes    Emphysema of lung (HCC)    Gastric ulcer    GERD (gastroesophageal reflux disease)    Hepatitis B    Hypertension    Parkinson's disease    Prostate hypertrophy    Rotator cuff tear right   UTI (urinary tract infection)    Patient Active Problem List   Diagnosis Date Noted   PNA (pneumonia) 04/13/2022   Atrial fibrillation ([place]) 02/21/2022   Hypokalemia 02/20/2022   Multifocal pneumonia 08/01/2021   HTN (hypertension) 08/01/2021   Sepsis ([place]) 08/01/2021   BPH (benign prostatic hyperplasia) 08/01/2021   COPD exacerbation ([place]) 07/22/2021   Acute on chronic respiratory failure with hypoxia ([place]) 07/22/2021   Paroxysmal atrial fibrillation ([place]) 07/22/2021   Essential hypertension 07/22/2021   History of hepatitis D 07/22/2021   Chronic respiratory failure with hypoxia ([place]) 06/29/2021   History of gastric ulcer 06/29/2021   Paroxysmal atrial fibrillation  with rapid ventricular response ([place]) 06/29/2021   Rapid atrial fibrillation ([place]) 06/29/2021   Long term (current) use of anticoagulants 06/20/2021   Chest pain    Acute respiratory failure with hypoxia ([place]) 02/26/2021   Sepsis due to undetermined organism ([place]) 02/26/2021   Bacterial pneumonia 02/26/2021   Parkinson's disease 03/26/2019   Sternal fracture with retrosternal contusion, closed, initial encounter 10/31/2017   Chronic bronchitis ([place]) 05/28/2015   Gastro-esophageal reflux disease without esophagitis 05/26/2015   Insomnia, 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   COPD with acute exacerbation ([place]) 04/11/2014   Benign prostatic hypertrophy without urinary obstruction 01/21/2014   Acute bronchitis with chronic obstructive pulmonary disease (COPD) ([place]) 01/21/2014   Accelerated hypertension 01/21/2014   SINUSITIS- ACUTE-NOS 07/03/2007   RENAL CALCULUS 06/05/2007   HYPERTENSION 02/27/2007   COPD (chronic obstructive pulmonary disease) ([place]) 02/27/2007   GERD 02/27/2007   PEPTIC ULCER DISEASE 02/27/2007   BENIGN PROSTATIC HYPERTROPHY 02/27/2007   BURSITIS, RIGHT SHOULDER 02/27/2007   COLONIC POLYPS, HX OF 02/27/2007   FROZEN RIGHT SHOULDER 12/05/2006   Home Medication(s) Prior to Admission medications   Medication Sig Start Date End Date Taking? Authorizing Provider  albuterol (VENTOLIN HFA) 108 (90 Base) MCG/ACT inhaler Inhale 2 puffs into the lungs every 6 (six) hours as needed for wheezing or shortness of breath. 02/26/22   Emeterio Reeve, DO  ALPRAZolam Duanne Moron) 0.5 MG tablet Take 0.5 mg by mouth at bedtime as needed for anxiety or sleep.    [provider]  amiodarone (  PACERONE) 200 MG tablet Take 100 mg by mouth daily. 03/22/22   [provider]  budesonide-formoterol (SYMBICORT) 160-4.5 MCG/ACT inhaler Inhale 2 puffs into the lungs 2 (two) times daily. 07/25/21   Ghimire, Henreitta Leber, MD  carbidopa-levodopa (SINEMET IR) 25-100 MG tablet Take 2 tablets by mouth 5  (five) times daily. Takes at 0800, 1200, 1600, 2000, and 00:00 (midnight)    [provider]  Cyanocobalamin ([employer]S VITAMIN B-12) 2000 MCG TBCR Take 2,000 mcg by mouth daily.    [provider]  ferrous gluconate (FERGON) 324 MG tablet Take 324 mg by mouth daily with breakfast.    [provider]  ipratropium-albuterol (DUONEB) 0.5-2.5 (3) MG/3ML SOLN Take 3 mLs by nebulization every 2 (two) hours as needed (ehzzeing / shortness of breath). 02/26/22   Emeterio Reeve, DO  metoprolol tartrate (LOPRESSOR) 25 MG tablet Take 1 tablet (25 mg total) by mouth 2 (two) times daily. 02/26/22   Emeterio Reeve, DO  montelukast (SINGULAIR) 10 MG tablet Take 10 mg by mouth at bedtime.    [provider]  omeprazole (PRILOSEC) 40 MG capsule Take 40 mg by mouth daily.  11/06/15   [provider]  QUEtiapine (SEROQUEL) 50 MG tablet Take 50 mg by mouth at bedtime.    [provider]  roflumilast (DALIRESP) 500 MCG TABS tablet Take 500 mcg by mouth daily. 03/21/22   [provider]  SUMAtriptan (IMITREX) 100 MG tablet Take 100 mg by mouth every 2 (two) hours as needed for migraine. 11/15/21 11/15/22  [provider]  warfarin (COUMADIN) 5 MG tablet Take 2.5 mg by mouth daily. Take 1/2 tablet (2.5mg ) every night    [provider]                                                                                                                                    Allergies Patient has no known allergies.  Review of Systems Review of Systems As noted in HPI  Physical Exam Vital Signs  I have reviewed the triage vital signs BP (!) 101/50   Pulse 75   Temp 99.3 F (37.4 C)   Resp (!) 24   Ht 5\' 9"  (1.753 m)   Wt 75.6 kg   SpO2 99%   BMI 24.61 kg/m   Physical Exam Vitals reviewed.  Constitutional:      General: He is not in acute distress.    Appearance: He is well-developed. He is not diaphoretic.  HENT:     Head:  Normocephalic and atraumatic.     Nose: Nose normal.  Eyes:     General: No scleral icterus.       Right eye: No discharge.        Left eye: No discharge.     Conjunctiva/sclera: Conjunctivae normal.     Pupils: Pupils are equal, round, and reactive to light.  Cardiovascular:     Rate and  Rhythm: Normal rate and regular rhythm.     Heart sounds: No murmur heard.    No friction rub. No gallop.  Pulmonary:     Effort: Pulmonary effort is normal. Tachypnea present. No respiratory distress.     Breath sounds: Normal breath sounds. Decreased air movement present. No stridor. No rales.  Abdominal:     General: There is no distension.     Palpations: Abdomen is soft.     Tenderness: There is no abdominal tenderness.  Musculoskeletal:        General: No tenderness.     Cervical back: Normal range of motion and neck supple.  Skin:    General: Skin is warm and dry.     Findings: No erythema or rash.  Neurological:     Mental Status: He is alert and oriented to person, place, and time.     ED Results and Treatments Labs (all labs ordered are listed, but only abnormal results are displayed) Labs Reviewed  COMPREHENSIVE METABOLIC PANEL - Abnormal; Notable for the following components:      Result Value   Glucose, Bld 108 (*)    Calcium 8.7 (*)    Total Protein 6.0 (*)    Albumin 3.3 (*)    All other components within normal limits  CBC - Abnormal; Notable for the following components:   RBC 3.15 (*)    Hemoglobin 9.9 (*)    HCT 34.8 (*)    MCV 110.5 (*)    MCHC 28.4 (*)    All other components within normal limits  D-DIMER, QUANTITATIVE - Abnormal; Notable for the following components:   D-Dimer, Quant 0.58 (*)    All other components within normal limits  I-STAT VENOUS BLOOD GAS, ED - Abnormal; Notable for the following components:   pCO2, Ven 61.6 (*)    pO2, Ven 89 (*)    Bicarbonate 36.1 (*)    TCO2 38 (*)    Acid-Base Excess 9.0 (*)    HCT 34.0 (*)    Hemoglobin 11.6  (*)    All other components within normal limits  RESP PANEL BY RT-PCR (RSV, FLU A&B, COVID)  RVPGX2  CULTURE, BLOOD (ROUTINE X 2)  CULTURE, BLOOD (ROUTINE X 2)  BRAIN NATRIURETIC PEPTIDE  LACTIC ACID, PLASMA  LACTIC ACID, PLASMA                                                                                                                         EKG  EKG Interpretation  Date/Time:  Saturday July 27 2022 05:54:50 EDT Ventricular Rate:  86 PR Interval:  167 QRS Duration: 89 QT Interval:  378 QTC Calculation: 453 R Axis:   60 Text Interpretation: Sinus rhythm Confirmed by Addison Lank [phone]) on 07/27/2022 7:07:58 AM       Radiology DG Chest Port 1 View  Result Date: 07/27/2022 CLINICAL DATA:  Shortness of breath EXAM: PORTABLE CHEST 1 VIEW COMPARISON:  04/13/2022 FINDINGS: Basal pulmonary opacity asymmetric  to the left. No effusion or pneumothorax. Normal heart size and mediastinal contours. IMPRESSION: 1. Pneumonia on the left more than right. 2. Emphysema. Electronically Signed   By: Jorje Guild M.D.   On: 07/27/2022 06:38    Medications Ordered in ED Medications  cefTRIAXone (ROCEPHIN) 1 g in sodium chloride 0.9 % 100 mL IVPB (has no administration in time range)  azithromycin (ZITHROMAX) 500 mg in sodium chloride 0.9 % 250 mL IVPB (has no administration in time range)  methylPREDNISolone sodium succinate (SOLU-MEDROL) 125 mg/2 mL injection 125 mg (125 mg Intravenous Given 07/27/22 0606)  ipratropium (ATROVENT) nebulizer solution 0.5 mg (0.5 mg Nebulization Given 07/27/22 0622)  levalbuterol (XOPENEX) nebulizer solution 1.25 mg (1.25 mg Nebulization Given 07/27/22 0621)                                                                                                                                     Procedures .Critical Care  Performed by: Fatima Blank, MD Authorized by: Fatima Blank, MD   Critical care provider statement:    Critical care time  (minutes):  45   Critical care time was exclusive of:  Separately billable procedures and treating other patients   Critical care was necessary to treat or prevent imminent or life-threatening deterioration of the following conditions:  Respiratory failure   Critical care was time spent personally by me on the following activities:  Development of treatment plan with patient or surrogate, discussions with consultants, evaluation of patient's response to treatment, examination of patient, obtaining history from patient or surrogate, review of old charts, re-evaluation of patient's condition, pulse oximetry, ordering and review of radiographic studies, ordering and review of laboratory studies and ordering and performing treatments and interventions   Care discussed with: admitting provider     (including critical care time)  Medical Decision Making / ED Course  Click here for ABCD2, HEART and other calculators  Medical Decision Making Amount and/or Complexity of Data Reviewed Labs: ordered. Decision-making details documented in ED Course. Radiology: ordered and independent interpretation performed. Decision-making details documented in ED Course. ECG/medicine tests: ordered and independent interpretation performed. Decision-making details documented in ED Course.  Risk Prescription drug management.    This patient presents to the ED for concern of resp distress, this involves an extensive number of treatment options, and is a complaint that carries with it a high risk of complications and morbidity. The differential diagnosis includes but not limited to COPD exacerbation, PNA, PTx, PE, HF, tachydysrhythmia  Transitioned to BiPAP. In-line breathing treatments given. Solumedrol ordered.  EKG without acute ischemic changes, dysrhythmias or blocks. Chest x-ray with evidence of bilateral pneumonia CBC without leukocytosis.  Mild anemia. Metabolic panel without significant electrolyte  derangements or renal sufficiency VBG with evidence of mild hypercarbia and compensated resp acidosis Viral panel negative. Dimer mildly elevated, but not above age adjusted threshold. Doubt PE. BNP pending  Started on  CAP abx.  Will admit to medicine for further management.        Final Clinical Impression(s) / ED Diagnoses Final diagnoses:  Acute respiratory failure with hypoxia and hypercapnia ([place])  Multifocal pneumonia           This chart was dictated using voice recognition software.  Despite best efforts to proofread,  errors can occur which can change the documentation meaning.    Fatima Blank, MD 07/27/22 [phone]

## 2022-07-27 NOTE — Progress Notes (Addendum)
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 Patient presented to [facility] ED with complaints of SOB via EMS. Hospice was not notified of patients complaints or transfer to the ED. Notified by hospital ED MD of patient presentation with O2 sats in th 80's and MD clarifying EOL decisions. Per documentation with [facility] patient does not want CPR, though he would want intubation and hospitalization if needed. Pt is currently admitted with Acute on chronic hypoxemic and hypercapnic respiratory failure, COPD exacerbation and recurrent pneumonia. Per Dr. Gilford Rile with [facility], this is a related hospital admission.  Met with patient at bedside who is currently on BiPap, he nods his head that he is feeling better than we he arrived. Provided reassurance that we would follow him while hospitalized and he nods his head.  Patient is inpatient appropriate as he is requiring BiPap support, IV antibiotics, IV Lasix and IV steroids as well as continued close monitoring of respiratory status.  I/O: Not recorded  Abnormal labs: Glucose: 108 (H) Calcium: 8.7 (L) Albumin: 3.3 (L) Total Protein: 6.0 (L) GFR, Estimated: >60 RBC: 3.15 (L) Hemoglobin: 9.9 (L) HCT: 34.8 (L) MCV: 110.5 (H) MCHC: 28.4 (L) D-Dimer, Quant: 0.58 (H)  Diagnostics: DG Chest: FINDINGS: Basal pulmonary opacity asymmetric to the left. No effusion or pneumothorax. Normal heart size and mediastinal contours.   IMPRESSION: 1. Pneumonia on the left more than right. 2. Emphysema.  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   #Acute on chronic hypoxemic and hypercapnic RF #COPD exacerbation  #PNA (recurrent) Patient with increased coughing and sputum production over the past couple of weeks and worsening dyspnea. He was started on doxycycline by his PCP 2 days prior to admission. He presented to the ED with O2 sats in the low 80s, no leukocytosis, VBG w/ pco2 in the 60s. CXR w/ bilateral opacities at the bases. His dyspnea did improve with nebs and bipap. Attempt at pausing bipap was mmade multiple times and patient had desaturations to 83%.    Patient is DNR and currently on hospice care. He does state that he would not like CPR, but at this time he would like to proceed with intubation if his pneumonia/COPD exacerbation does not improve with antibiotics, steroids, and nebs.  -Patient has a history of recurrent PNA, noted that he had dysphagia in the chart, started patient on CAP coverage +flagyl for anaerobic  -F/u urine legionella and strep pneumo  -Continue duonebs, continue bipap for now, and see if patient can get break from bipap in the afternoon to eat and take PO medications -[facility] called, and their inpatient team will follow along      #pAFib  On coumadin (DOAC unaffordable), amiodarone, and metoprolol.  -Continue metoprolol amio and coumadin    #HFpEF Appears euvolemic on exam, possibly signs of pulmonary edema on CXR.  -Give IV lasix 40mg   x1    #parkinsons dz  #H/o esophageal dysphagia  On mechanical soft diet/dysphagia 3. On sinimet as well.    #h/o PUD  Continue PPI   Discharge Planning: Return home with the support of hospice.  Family contact: spoke with wife, Patrick Montoya by phone  IDT: Updated  Goals of care: treat the treatable, would include intubation. No CPR.  Should patient need ambulance  transport, please call [facility] as [facility] contracts with them for our active hospice patients.  Please call with any hospice related questions/concerns.  Thank you, Margaretmary Eddy, BSN, RN [facility] Liaison [phone]

## 2022-07-27 NOTE — ED Notes (Signed)
ED TO INPATIENT HANDOFF REPORT  ED Nurse Name and Phone #: [phone] [place] Name/Age/Gender Patrick Montoya 82 y.o. male Room/Bed: 018C/018C  Code Status   Code Status: DNR  Home/SNF/Other Home Patient oriented to: self, place, time, and situation Is this baseline? Yes   Triage Complete: Triage complete  Chief Complaint Acute hypoxemic respiratory failure ([place]) [J96.01]  Triage Note Patient arrives via ems from home due to SOB , SPO2 80% when ems arrived. Placed on cpap and administered 2 g magnesium. Hx copd   Allergies No Known Allergies  Level of Care/Admitting Diagnosis ED Disposition     ED Disposition  Admit   Condition  --   [place]: [place] [[id number]]  Level of Care: Progressive [102]  Admit to Progressive based on following criteria: RESPIRATORY PROBLEMS hypoxemic/hypercapnic respiratory failure that is responsive to NIPPV (BiPAP) or High Flow Nasal Cannula (6-80 lpm). Frequent assessment/intervention, no > Q2 hrs < Q4 hrs, to maintain oxygenation and pulmonary hygiene.  May admit patient to [facility] or [facility] if equivalent level of care is available:: Yes  Covid Evaluation: Confirmed COVID Negative  Diagnosis: Acute hypoxemic respiratory failure [facility]) [id number]  Admitting Physician: Charise Killian [id number]  Attending Physician: Charise Killian [id number]  Certification:: I certify this patient will need inpatient services for at least 2 midnights  Estimated Length of Stay: 4          B Medical/Surgery History Past Medical History:  Diagnosis Date   Basal cell carcinoma 07/25/2015   L zygoma inf to lat canthus   Basal cell carcinoma 06/11/2018   L nasal ala   Basal cell carcinoma 10/04/2019   Left nose. Infiltrative pattern.   BPH (benign prostatic hyperplasia)    COPD (chronic obstructive pulmonary disease) (HCC)    Elevated liver enzymes    Emphysema of lung (HCC)    Gastric ulcer    GERD  (gastroesophageal reflux disease)    Hepatitis B    Hypertension    Parkinson's disease    Prostate hypertrophy    Rotator cuff tear right   UTI (urinary tract infection)    Past Surgical History:  Procedure Laterality Date   CHOLECYSTECTOMY     COLONOSCOPY WITH PROPOFOL N/A 10/20/2015   Procedure: COLONOSCOPY WITH PROPOFOL;  Surgeon: Manya Silvas, MD;  [facility];  Service: Endoscopy;  Laterality: N/A;   COLONOSCOPY WITH PROPOFOL N/A 01/24/2021   Procedure: COLONOSCOPY WITH PROPOFOL;  Surgeon: Toledo, Benay Pike, MD;  Location: [facility] ENDOSCOPY;  Service: Gastroenterology;  Laterality: N/A;   ESOPHAGOGASTRODUODENOSCOPY (EGD) WITH PROPOFOL N/A 10/20/2015   Procedure: ESOPHAGOGASTRODUODENOSCOPY (EGD) WITH PROPOFOL;  Surgeon: Manya Silvas, MD;  Location: [facility] ENDOSCOPY;  Service: Endoscopy;  Laterality: N/A;   GREEN LIGHT LASER TURP (TRANSURETHRAL RESECTION OF PROSTATE N/A 11/17/2018   Procedure: GREEN LIGHT LASER TURP (TRANSURETHRAL RESECTION OF PROSTATE;  Surgeon: Royston Cowper, MD;  Location: [facility] ORS;  Service: Urology;  Laterality: N/A;   INGUINAL HERNIA REPAIR Right 02/18/2019   Procedure: HERNIA REPAIR INGUINAL ADULT;  Surgeon: Royston Cowper, MD;  Location: [facility] ORS;  Service: Urology;  Laterality: Right;   INSERTION OF MESH Right 02/18/2019   Procedure: INSERTION OF MESH;  Surgeon: Royston Cowper, MD;  Location: [facility] ORS;  Service: Urology;  Laterality: Right;   TONSILLECTOMY     TRANSURETHRAL MICROWAVE THERAPY       A IV Location/Drains/Wounds Patient Lines/Drains/Airways Status     Active Line/Drains/Airways  Name Placement date Placement time Site Days   Peripheral IV 07/27/22 Anterior;Left;Proximal Forearm 07/27/22  0552  Forearm  less than 1            Intake/Output Last 24 hours No intake or output data in the 24 hours ending 07/27/22 1523  Labs/Imaging Results for orders placed or performed during the hospital encounter of 07/27/22 (from  the past 48 hour(s))  Resp panel by RT-PCR (RSV, Flu A&B, Covid) Anterior Nasal Swab     Status: None   Collection Time: 07/27/22  5:57 AM   Specimen: Anterior Nasal Swab  Result Value Ref Range   SARS Coronavirus 2 by RT PCR NEGATIVE NEGATIVE   Influenza A by PCR NEGATIVE NEGATIVE   Influenza B by PCR NEGATIVE NEGATIVE    Comment: (NOTE) The Xpert Xpress SARS-CoV-2/FLU/RSV plus assay is intended as an aid in the diagnosis of influenza from Nasopharyngeal swab specimens and should not be used as a sole basis for treatment. Nasal washings and aspirates are unacceptable for Xpert Xpress SARS-CoV-2/FLU/RSV testing.  Fact Sheet for Patients: EntrepreneurPulse.com.au  Fact Sheet for Healthcare Providers: IncredibleEmployment.be  This test is not yet approved or cleared by the [place] FDA and has been authorized for detection and/or diagnosis of SARS-CoV-2 by FDA under an Emergency Use Authorization (EUA). This EUA will remain in effect (meaning this test can be used) for the duration of the COVID-19 declaration under Section 564(b)(1) of the Act, 21 U.S.C. section 360bbb-3(b)(1), unless the authorization is terminated or revoked.     Resp Syncytial Virus by PCR NEGATIVE NEGATIVE    Comment: (NOTE) Fact Sheet for Patients: EntrepreneurPulse.com.au  Fact Sheet for Healthcare Providers: IncredibleEmployment.be  This test is not yet approved or cleared by the [place] FDA and has been authorized for detection and/or diagnosis of SARS-CoV-2 by FDA under an Emergency Use Authorization (EUA). This EUA will remain in effect (meaning this test can be used) for the duration of the COVID-19 declaration under Section 564(b)(1) of the Act, 21 U.S.C. section 360bbb-3(b)(1), unless the authorization is terminated or revoked.  Performed at [facility], [place] [address], [place], [place] [address]    Comprehensive metabolic panel     Status: Abnormal   Collection Time: 07/27/22  5:57 AM  Result Value Ref Range   Sodium 141 135 - 145 mmol/L   Potassium 3.8 3.5 - 5.1 mmol/L   Chloride 103 98 - 111 mmol/L   CO2 27 22 - 32 mmol/L   Glucose, Bld 108 (H) 70 - 99 mg/dL    Comment: Glucose reference range applies only to samples taken after fasting for at least 8 hours.   BUN 15 8 - 23 mg/dL   Creatinine, Ser 0.89 0.61 - 1.24 mg/dL   Calcium 8.7 (L) 8.9 - 10.3 mg/dL   Total Protein 6.0 (L) 6.5 - 8.1 g/dL   Albumin 3.3 (L) 3.5 - 5.0 g/dL   AST 35 15 - 41 U/L   ALT 16 0 - 44 U/L   Alkaline Phosphatase 56 38 - 126 U/L   Total Bilirubin 0.9 0.3 - 1.2 mg/dL   GFR, Estimated >60 >60 mL/min    Comment: (NOTE) Calculated using the CKD-EPI Creatinine Equation (2021)    Anion gap 11 5 - 15    Comment: Performed at [facility], [place] [address], [place], [place] [address]  CBC     Status: Abnormal   Collection Time: 07/27/22  5:57 AM  Result Value  Ref Range   WBC 9.5 4.0 - 10.5 K/uL   RBC 3.15 (L) 4.22 - 5.81 MIL/uL   Hemoglobin 9.9 (L) 13.0 - 17.0 g/dL   HCT 34.8 (L) 39.0 - 52.0 %   MCV 110.5 (H) 80.0 - 100.0 fL   MCH 31.4 26.0 - 34.0 pg   MCHC 28.4 (L) 30.0 - 36.0 g/dL   RDW 14.5 11.5 - 15.5 %   Platelets 187 150 - 400 K/uL   nRBC 0.0 0.0 - 0.2 %    Comment: Performed at [facility], [place] [address] [place], [place] [address]  D-dimer, quantitative     Status: Abnormal   Collection Time: 07/27/22  5:57 AM  Result Value Ref Range   D-Dimer, Quant 0.58 (H) 0.00 - 0.50 ug/mL-FEU    Comment: (NOTE) At the manufacturer cut-off value of 0.5 g/mL FEU, this assay has a negative predictive value of 95-100%.This assay is intended for use in conjunction with a clinical pretest probability (PTP) assessment model to exclude pulmonary embolism (PE) and deep venous thrombosis (DVT) in outpatients suspected of PE or DVT. Results should be correlated with clinical  presentation. Performed at [facility], [place] [address] [place], [place] [address]   Brain natriuretic peptide     Status: None   Collection Time: 07/27/22  5:57 AM  Result Value Ref Range   B Natriuretic Peptide 91.7 0.0 - 100.0 pg/mL    Comment: Performed at [place] [address], [place], [place] [address]  I-Stat venous blood gas, ED     Status: Abnormal   Collection Time: 07/27/22  6:10 AM  Result Value Ref Range   pH, Ven 7.376 7.25 - 7.43   pCO2, Ven 61.6 (H) 44 - 60 mmHg   pO2, Ven 89 (H) 32 - 45 mmHg   Bicarbonate 36.1 (H) 20.0 - 28.0 mmol/L   TCO2 38 (H) 22 - 32 mmol/L   O2 Saturation 96 %   Acid-Base Excess 9.0 (H) 0.0 - 2.0 mmol/L   Sodium 144 135 - 145 mmol/L   Potassium 3.9 3.5 - 5.1 mmol/L   Calcium, Ion 1.16 1.15 - 1.40 mmol/L   HCT 34.0 (L) 39.0 - 52.0 %   Hemoglobin 11.6 (L) 13.0 - 17.0 g/dL   Sample type VENOUS   Blood culture (routine x 2)     Status: None (Preliminary result)   Collection Time: 07/27/22  8:15 AM   Specimen: BLOOD  Result Value Ref Range   Specimen Description BLOOD SITE NOT SPECIFIED    Special Requests      BOTTLES DRAWN AEROBIC AND ANAEROBIC Blood Culture adequate volume   Culture      NO GROWTH <12 HOURS Performed at [facility], [address], [place], [place] [address]    Report Status PENDING   Blood culture (routine x 2)     Status: None (Preliminary result)   Collection Time: 07/27/22  8:20 AM   Specimen: BLOOD  Result Value Ref Range   Specimen Description BLOOD SITE NOT SPECIFIED    Special Requests      BOTTLES DRAWN AEROBIC AND ANAEROBIC Blood Culture adequate volume   Culture      NO GROWTH <12 HOURS Performed at [facility], [place] [address] [place], [place] [address]    Report Status PENDING   Lactic acid, plasma     Status: None   Collection Time: 07/27/22  8:25 AM  Result Value  Ref Range   Lactic Acid, Venous 1.0 0.5 - 1.9 mmol/L    Comment: Performed at [place] [address], [place], [place] [address]  Magnesium     Status: None   Collection Time: 07/27/22 10:45 AM  Result Value Ref Range   Magnesium 2.2 1.7 - 2.4 mg/dL    Comment: Performed at [facility] [address], [place], [place] [address]  Protime-INR     Status: None   Collection Time: 07/27/22 10:45 AM  Result Value Ref Range   Prothrombin Time 14.8 11.4 - 15.2 seconds   INR 1.2 0.8 - 1.2    Comment: (NOTE) INR goal varies based on device and disease states. Performed at [facility], [place] [address], [place], [place] [address]   Folate     Status: None   Collection Time: 07/27/22 10:45 AM  Result Value Ref Range   Folate 10.1 >5.9 ng/mL    Comment: Performed at [place] [address], [place], [place] [address]  Iron and TIBC     Status: Abnormal   Collection Time: 07/27/22 10:45 AM  Result Value Ref Range   Iron 15 (L) 45 - 182 ug/dL   TIBC 368 250 - 450 ug/dL   Saturation Ratios 4 (L) 17.9 - 39.5 %   UIBC 353 ug/dL    Comment: Performed at [facility], [place] [address] [place], [place] [address]  Ferritin     Status: None   Collection Time: 07/27/22 10:45 AM  Result Value Ref Range   Ferritin 75 24 - 336 ng/mL    Comment: Performed at [facility] [address], [place], [place] [address]   DG Chest Port 1 View  Result Date: 07/27/2022 CLINICAL DATA:  Shortness of breath EXAM: PORTABLE CHEST 1 VIEW COMPARISON:  04/13/2022 FINDINGS: Basal pulmonary opacity asymmetric to the left. No effusion or pneumothorax. Normal heart size and mediastinal contours. IMPRESSION: 1. Pneumonia on the left more than right. 2. Emphysema. Electronically Signed   By: Jorje Guild M.D.   On: 07/27/2022 06:38    Pending Labs Unresulted Labs (From admission, onward)     Start     Ordered   07/28/22 0500  Comprehensive metabolic panel  Tomorrow morning,   R        07/27/22 0901   07/28/22 0500  CBC with Differential/Platelet  Tomorrow morning,   R         07/27/22 0958   07/27/22 1210  Blood gas, venous  Once,   R        07/27/22 1209   07/27/22 1021  Vitamin B12  Once,   R        07/27/22 1020   07/27/22 0854  Legionella Pneumophila Serogp 1 Ur Ag  (COPD / Pneumonia / Cellulitis / Lower Extremity Wound)  Once,   R        07/27/22 0901   07/27/22 0854  Strep pneumoniae urinary antigen  (COPD / Pneumonia / Cellulitis / Lower Extremity Wound)  Once,   R        07/27/22 0901   07/27/22 0854  MRSA Next Gen by PCR, Nasal  (COPD / Pneumonia / Cellulitis / Lower Extremity Wound)  Once,   R        07/27/22 0901   07/27/22 0854  Procalcitonin  Once,   R       References:  Procalcitonin Lower Respiratory Tract Infection AND Sepsis Procalcitonin Algorithm   07/27/22 0901            Vitals/Pain Today's Vitals   07/27/22 1430 07/27/22 1445 07/27/22 1500 07/27/22 1515  BP: 119/69 122/67 114/60 112/66  Pulse: 73 73 71 72  Resp: (!) 30 (!) 31 (!) 23 17  Temp:      SpO2: 94% 95% 100% 98%  Weight:      Height:      PainSc:        Isolation Precautions No active isolations  Medications Medications  acetaminophen (TYLENOL) tablet 650 mg (has no administration in time range)    Or  acetaminophen (TYLENOL) suppository 650 mg (has no administration in time range)  ondansetron (ZOFRAN) tablet 4 mg (has no administration in time range)    Or  ondansetron (ZOFRAN) injection 4 mg (has no administration in time range)  ipratropium-albuterol (DUONEB) 0.5-2.5 (3) MG/3ML nebulizer solution 3 mL (has no administration in time range)  cefTRIAXone (ROCEPHIN) 1 g in sodium chloride 0.9 % 100 mL IVPB (has no administration in time range)  azithromycin (ZITHROMAX) 500 mg in sodium chloride 0.9 % 250 mL IVPB (has no administration in time range)  fluticasone furoate-vilanterol (BREO ELLIPTA) 200-25 MCG/ACT 1 puff (has no administration in time range)  carbidopa-levodopa (SINEMET IR) 25-100 MG per tablet immediate release 2 tablet (2 tablets Oral  Given 07/27/22 1318)  metoprolol tartrate (LOPRESSOR) tablet 25 mg (25 mg Oral Given 07/27/22 1039)  pantoprazole (PROTONIX) injection 40 mg (40 mg Intravenous Given 07/27/22 1136)  amiodarone (PACERONE) tablet 100 mg (100 mg Oral Given 07/27/22 1312)  enoxaparin (LOVENOX) injection 75 mg (75 mg Subcutaneous Given 07/27/22 1312)  predniSONE (DELTASONE) tablet 40 mg (has no administration in time range)  QUEtiapine (SEROQUEL) tablet 25 mg (has no administration in time range)  ALPRAZolam (XANAX) tablet 0.25 mg (has no administration in time range)  glycopyrrolate (ROBINUL) injection 0.2 mg (has no administration in time range)  methylPREDNISolone sodium succinate (SOLU-MEDROL) 125 mg/2 mL injection 125 mg (125 mg Intravenous Given 07/27/22 0606)  ipratropium (ATROVENT) nebulizer solution 0.5 mg (0.5 mg Nebulization Given 07/27/22 0622)  levalbuterol (XOPENEX) nebulizer solution 1.25 mg (1.25 mg Nebulization Given 07/27/22 0621)  cefTRIAXone (ROCEPHIN) 1 g in sodium chloride 0.9 % 100 mL IVPB (0 g Intravenous Stopped 07/27/22 0924)  azithromycin (ZITHROMAX) 500 mg in sodium chloride 0.9 % 250 mL IVPB (0 mg Intravenous Stopped 07/27/22 1031)  furosemide (LASIX) injection 40 mg (40 mg Intravenous Given 07/27/22 1039)    Mobility walks     Focused Assessments    R Recommendations: See Admitting Provider Note  Report given to:   Additional Notes: uses urinal. On 5L nasal cannula. A&O X4.

## 2022-07-28 DIAGNOSIS — J9601 Acute respiratory failure with hypoxia: Secondary | ICD-10-CM

## 2022-07-28 DIAGNOSIS — J441 Chronic obstructive pulmonary disease with (acute) exacerbation: Secondary | ICD-10-CM

## 2022-07-28 DIAGNOSIS — Z87891 Personal history of nicotine dependence: Secondary | ICD-10-CM

## 2022-07-28 DIAGNOSIS — Z515 Encounter for palliative care: Secondary | ICD-10-CM

## 2022-07-28 DIAGNOSIS — J9621 Acute and chronic respiratory failure with hypoxia: Secondary | ICD-10-CM | POA: Diagnosis not present

## 2022-07-28 DIAGNOSIS — Z66 Do not resuscitate: Secondary | ICD-10-CM

## 2022-07-28 DIAGNOSIS — J189 Pneumonia, unspecified organism: Secondary | ICD-10-CM | POA: Diagnosis not present

## 2022-07-28 DIAGNOSIS — Z7189 Other specified counseling: Secondary | ICD-10-CM | POA: Diagnosis not present

## 2022-07-28 DIAGNOSIS — Z711 Person with feared health complaint in whom no diagnosis is made: Secondary | ICD-10-CM

## 2022-07-28 DIAGNOSIS — J9622 Acute and chronic respiratory failure with hypercapnia: Secondary | ICD-10-CM | POA: Diagnosis not present

## 2022-07-28 DIAGNOSIS — J9602 Acute respiratory failure with hypercapnia: Secondary | ICD-10-CM

## 2022-07-28 DIAGNOSIS — Z789 Other specified health status: Secondary | ICD-10-CM

## 2022-07-28 LAB — COMPREHENSIVE METABOLIC PANEL
ALT: 7 U/L (ref 0–44)
AST: 18 U/L (ref 15–41)
Albumin: 2.8 g/dL — ABNORMAL LOW (ref 3.5–5.0)
Alkaline Phosphatase: 42 U/L (ref 38–126)
Anion gap: 6 (ref 5–15)
BUN: 19 mg/dL (ref 8–23)
CO2: 33 mmol/L — ABNORMAL HIGH (ref 22–32)
Calcium: 8.6 mg/dL — ABNORMAL LOW (ref 8.9–10.3)
Chloride: 103 mmol/L (ref 98–111)
Creatinine, Ser: 0.83 mg/dL (ref 0.61–1.24)
GFR, Estimated: >60 mL/min (ref >60)
Glucose, Bld: 135 mg/dL — ABNORMAL HIGH (ref 70–99)
Potassium: 3.6 mmol/L (ref 3.5–5.1)
Sodium: 142 mmol/L (ref 135–145)
Total Bilirubin: 0.5 mg/dL (ref 0.3–1.2)
Total Protein: 5.4 g/dL — ABNORMAL LOW (ref 6.5–8.1)

## 2022-07-28 LAB — CBC WITH DIFFERENTIAL/PLATELET
Abs Immature Granulocytes: 0.05 10*3/uL (ref 0.00–0.07)
Basophils Absolute: 0 10*3/uL (ref 0.0–0.1)
Basophils Relative: 0 %
Eosinophils Absolute: 0 10*3/uL (ref 0.0–0.5)
Eosinophils Relative: 0 %
HCT: 29.7 % — ABNORMAL LOW (ref 39.0–52.0)
Hemoglobin: 8.6 g/dL — ABNORMAL LOW (ref 13.0–17.0)
Immature Granulocytes: 0 %
Lymphocytes Relative: 4 %
Lymphs Abs: 0.4 10*3/uL — ABNORMAL LOW (ref 0.7–4.0)
MCH: 31.4 pg (ref 26.0–34.0)
MCHC: 29 g/dL — ABNORMAL LOW (ref 30.0–36.0)
MCV: 108.4 fL — ABNORMAL HIGH (ref 80.0–100.0)
Monocytes Absolute: 0.8 10*3/uL (ref 0.1–1.0)
Monocytes Relative: 6 %
Neutro Abs: 11 10*3/uL — ABNORMAL HIGH (ref 1.7–7.7)
Neutrophils Relative %: 90 %
Platelets: 153 10*3/uL (ref 150–400)
RBC: 2.74 MIL/uL — ABNORMAL LOW (ref 4.22–5.81)
RDW: 14.4 % (ref 11.5–15.5)
WBC: 12.3 10*3/uL — ABNORMAL HIGH (ref 4.0–10.5)
nRBC: 0 % (ref 0.0–0.2)

## 2022-07-28 LAB — PROTIME-INR
INR: 1.1 (ref 0.8–1.2)
Prothrombin Time: 14.4 seconds (ref 11.4–15.2)

## 2022-07-28 LAB — HEPARIN LEVEL (UNFRACTIONATED): Heparin Unfractionated: 1 IU/mL — ABNORMAL HIGH (ref 0.30–0.70)

## 2022-07-28 MED ORDER — APIXABAN 5 MG PO TABS
5.0000 mg | ORAL_TABLET | Freq: Two times a day (BID) | ORAL | Status: DC
  Administered 2022-07-28 – 2022-07-29 (×2): 5 mg via ORAL
  Filled 2022-07-28 (×3): qty 1

## 2022-07-28 MED ORDER — AZITHROMYCIN 250 MG PO TABS
500.0000 mg | ORAL_TABLET | Freq: Every day | ORAL | Status: DC
  Administered 2022-07-29: 500 mg via ORAL
  Filled 2022-07-28: qty 2

## 2022-07-28 NOTE — Progress Notes (Signed)
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 Patient presented to [facility] ED with complaints of SOB via EMS. Hospice was not notified of patients complaints or transfer to the ED. Notified by hospital ED MD of patient presentation with O2 sats in th 80's and MD clarifying EOL decisions. Per documentation with [facility] patient does not want CPR, though he would want intubation and hospitalization if needed. Pt is currently admitted with Acute on chronic hypoxemic and hypercapnic respiratory failure, COPD exacerbation and recurrent pneumonia. Per Dr. Gilford Rile with [facility], this is a related hospital admission.   Met with patient at bedside who is listening to music from his phone. He states he is feeling much better today. He denies pain and states he does not feel like he is drowning as he did yesterday morning when he activated EMS.    Patient is inpatient appropriate as he is requiring continued intermittent BiPap support, IV antibiotic and continued close monitoring of respiratory status.  VS: 98.1 oral, 110/74, 103, 20 92% on 4 lpm [place]  I/O: not recorded/1500  Abnormal labs: CO2: 33 (H) Glucose: 135 (H) Calcium: 8.6 (L) Albumin: 2.8 (L) Total Protein: 5.4 (L) BC: 12.3 (H) RBC: 2.74 (L) Hemoglobin: 8.6 (L) HCT: 29.7 (L) MCV: 108.4 (H) MCHC: 29.0 (L) NEUT#: 11.0 (H) Lymphocyte #: 0.4 (L)  Diagnostics: None new  IV/PRN: Rocephin 1 G IV Q 24 hours, Zithromax 500 mg IV Q 24 hours, Flagyl 500 mg IV TID  Problem List: Acute on chronic hypoxemic and hypercapnic RF COPD exacerbation  PNA (recurrent) Overnight patient was intermittently on BiPAP versus nasal cannula.  He reports that he feels dyspneic, but wants to eat.  He was able to transition off of BiPAP and had goals of care discussion with palliative 3/17.  CODE STATUS now DNR/DNI with limited interventions.  He would like to  continue with antibiotics and steroids. -Continue ceftriaxone and azithromycin for CAP coverage -Legionella pending -DuoNebs, BiPAP as needed -prednisone 40 mg 2/5 days -Code status updated   pAFib  Appreciate pharmacy's assistance with confirming medications for patient.  He was recently prescribed Eliquis for paroxysmal A-fib and has been taking this at home.  He is able to tolerate nasal cannula so can take p.o. medications -Eliquis -Continue metoprolol amio    HFpEF Given IV Lasix yesterday with urine output of -1.5L. Appears euvolemic on exam.  -hold further diuresis   Parkinsons dz  H/o esophageal dysphagia  On mechanical soft diet/dysphagia 3. On sinimet as well.    h/o PUD Continue PPI   Discharge Planning: Return home with the support of hospice when medically stable.  Family contact: spoke with wife [name] to provide update; she is currently at [facility] for support while Mr. Mcdevitt is hospitalized.  IDT: Updated  Goals of care: Treat the treatable. Readdressed with Palliative Medicine Team and patient has opted for no intubation now, no CPR.  Transfer summary and Medication List placed on patients hardback chart.  Should patient need  ambulance transport, please call [facility] as [facility] contracts with them for our active hospice patients.   Please call with any hospice related questions/concerns.   Thank you, Margaretmary Eddy, BSN, RN [facility] Liaison [phone]

## 2022-07-28 NOTE — Progress Notes (Signed)
HD#1 Subjective:  Overnight Events: Intermittently on BiPAP to nasal cannula  [name] assessed at bedside this morning.  He was on BiPAP during rounds making communication more difficult. He reported that he felt short of breath overnight.   Pt is updated on the plan for today, and all questions and concerns are addressed.   Objective:  Vital signs in last 24 hours: Vitals:   07/28/22 0203 07/28/22 0424 07/28/22 0432 07/28/22 0600  BP:  112/73    Pulse: 68 60 61 64  Resp: (!) 22 19 (!) 21 (!) 23  Temp:  97.6 F (36.4 C)    TempSrc:  Axillary    SpO2: 92% 93% 93% 95%  Weight:      Height:       Supplemental O2: Nasal Cannula SpO2: 95 % O2 Flow Rate (L/min): 4 L/min FiO2 (%): 40 %   Physical Exam:  Constitutional: chronically ill appearing  HENT: dry mucous membranes Cardiovascular: regular rate and rhythm, no m/r/g Pulmonary/Chest: normal work of breathing on BiPap, decreased air movement heard in lung bases bilaterally. Rhonchorous breath sounds in upper airway bilaterally Abdominal: soft, non-tender, non-distended MSK: no lower extremity edema on exam Neurological: alert and oriented to person and situation Skin: warm and dry  Filed Weights   07/27/22 0558  Weight: 75.6 kg     Intake/Output Summary (Last 24 hours) at 07/28/2022 0650 Last data filed at 07/28/2022 0600 Gross per 24 hour  Intake --  Output 1500 ml  Net -1500 ml   Net IO Since Admission: -1,500 mL [07/28/22 0650]  Pertinent Labs:  WBC 9.5 to 12.3 Hgb 11.6 to 8.6 w baseline 8.6 with MCV 108 Iron 15, sat 4 with ferritin 75  Imaging: CXR 3/16- IMPRESSION: 1. Pneumonia on the left more than right. 2. Emphysema.  Assessment/Plan:   Principal Problem:   Acute hypoxemic respiratory failure [facility])   Patient Summary: Patrick Montoya is a 82 y.o. 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   Acute on chronic hypoxemic and hypercapnic RF COPD exacerbation  PNA (recurrent) Overnight patient was intermittently on BiPAP versus nasal cannula.  He reports that he feels dyspneic, but wants to eat.  He was able to transition off of BiPAP and had goals of care discussion with palliative 3/17.  CODE STATUS now DNR/DNI with limited interventions.  He would like to continue with antibiotics and steroids. -Continue ceftriaxone and azithromycin for CAP coverage -Legionella pending -DuoNebs, BiPAP as needed -prednisone 40 mg 2/5 days -Code status updated  pAFib  Appreciate pharmacy's assistance with confirming medications for patient.  He was recently prescribed Eliquis for paroxysmal A-fib and has been taking this at home.  He is able to tolerate nasal cannula so can take p.o. medications -Eliquis -Continue metoprolol amio    HFpEF Given IV Lasix yesterday with urine output of -1.5L. Appears euvolemic on exam.  -hold further diuresis   Parkinsons dz  H/o esophageal dysphagia  On mechanical soft diet/dysphagia 3. On sinimet as well.    h/o PUD Continue PPI   Diet:  Dysphagia 3 diet IVF: none VTE:  DOAC for pAfib Code: DNR/DNI TOC recs: Hospice with [facility] Family Update: Patrick Montoya updated by phone   Dispo: Anticipated discharge to Home with hospice pending improvement in respiratory  failure.   Patrick Montoya M. Alando Colleran, D.O.  Internal Medicine Resident, PGY-2 [facility]  Pager: [phone] 6:50 AM, 07/28/2022   **Please contact the on call pager after 5 pm and on weekends at [phone].**

## 2022-07-28 NOTE — Progress Notes (Signed)
Daily Progress Note   Patient Name: Patrick Montoya       Date: 07/28/2022 DOB: November 11, 1940  Age: 82 y.o. MRN#: [id number] Attending Physician: Charise Killian, MD Primary Care Physician: Tracie Harrier, MD Admit Date: 07/27/2022  Reason for Consultation/Follow-up: Establishing goals of care  Subjective: I have reviewed medical records including EPIC notes, MAR, and labs. Received report from primary RN - no acute concerns. RN reports patient off BiPap and tolerating well.   Went to visit patient at bedside - no family/visitors present. Patient was lying in bed awake, alert, and able to participate in conversation. He is off BiPap on 5L O2 [place]; his baseline is 4L. He denies pain or shortness of breath. Oxygen saturations range from 92-95% on monitor. No signs or non-verbal gestures of pain or discomfort noted. No respiratory distress, increased work of breathing, or secretions noted. He does have a productive cough.   Emotional support provided to patient. Therapeutic listening provided as he reflects over the course of his decline at home, which lead to this hospital admission. He tells me he feels "considerably better today." We reviewed his goals as discussed with my colleague yesterday. He does confirm goal would be for intubation in the event of a decline - he specifies only for "1 to 2 days at most." Attempted to review goals behind wanting to prolong his life, knowing it will not offer quality and low chance of weaning off vent once it was initiated. Allowed space and time for patient to review the loving relationship he has with his family, especially son and wife. Patient tells me in regards to intubation, "I agree it wont change anything." We reviewed the difficult decisions his family would be  faced with in context of him being escalated to a ventilator. He tells me he realized "they would have to sit here and be sad" for the 1-2 days on the ventilator "knowing it won't help." Allowed additional space and time for patient to process his thoughts. After discussion, patient is clear he would not want to unnecessarily prolong his life in this way and cause his family additional suffering by "sitting around waiting for me to die." He would not want to escalate his care to intubation in the event of a decline, but would rather be kept comfortable using comfort medications for EOL.  At this time, he still would like to continue treating the treatable and is ok with BiPap if needed. He confirms DNR status.   Patient tells me he wants to update his family in regards to changes in [place] today. Encouraged him to provide updates and continue the open communication about his goals and wishes. He wants to tell his wife and his son/HCPOA. Validated this is a good action to take. He also wants to update the hospice team - explained I could also reach out to hospice liaison as well to provide updates and he was appreciative.   Therapeutic listening provided as patient reflects on his mothers "peaceful" passing with hospice. Patient confirms at discharge his goal is to continue hospice service.   Patient also tells me he has Medicaid application in process with goal for him and his wife to move into ALF. He recognizes they are needing help. He wonders if there is a way to get additional help in the home, in the meantime, on days he finds harder to function than others. He already has a Automotive engineer through hospice, which he really appreciates. I explained I would reach out to hospice liaison and TOC to explore additional resources - he was appreciative.   All questions and concerns addressed. Encouraged to call with questions and/or concerns. PMT card provided.  Length of Stay: 1  Current  Medications: Scheduled Meds:   amiodarone  100 mg Oral Daily   carbidopa-levodopa  2 tablet Oral 5 X Daily   enoxaparin (LOVENOX) injection  1 mg/kg Subcutaneous Q12H   fluticasone furoate-vilanterol  1 puff Inhalation Daily   metoprolol tartrate  25 mg Oral BID   pantoprazole (PROTONIX) IV  40 mg Intravenous Q24H   predniSONE  40 mg Oral Q breakfast   QUEtiapine  25 mg Oral QHS    Continuous Infusions:  azithromycin     cefTRIAXone (ROCEPHIN)  IV 1 g (07/28/22 0948)    PRN Meds: acetaminophen **OR** acetaminophen, ALPRAZolam, glycopyrrolate, ipratropium-albuterol, ondansetron **OR** ondansetron (ZOFRAN) IV  Physical Exam Vitals and nursing note reviewed.  Constitutional:      General: He is not in acute distress.    Appearance: He is ill-appearing.  Pulmonary:     Effort: No respiratory distress.  Skin:    General: Skin is warm and dry.  Neurological:     Mental Status: He is alert and oriented to person, place, and time.     Motor: Weakness present.  Psychiatric:        Attention and Perception: Attention normal.        Behavior: Behavior is cooperative.        Cognition and Memory: Cognition and memory normal.             Vital Signs: BP 110/74 (BP Location: Right Arm)   Pulse (!) 103   Temp 98.1 F (36.7 C) (Oral)   Resp 20   Ht 5\' 9"  (1.753 m)   Wt 75.6 kg   SpO2 92%   BMI 24.61 kg/m  SpO2: SpO2: 92 % O2 Device: O2 Device: Nasal Cannula O2 Flow Rate: O2 Flow Rate (L/min): 4 L/min  Intake/output summary:  Intake/Output Summary (Last 24 hours) at 07/28/2022 1114 Last data filed at 07/28/2022 0600 Gross per 24 hour  Intake --  Output 1500 ml  Net -1500 ml   LBM:   Baseline Weight: Weight: 75.6 kg Most recent weight: Weight: 75.6 kg       Palliative Assessment/Data: PPS 50-60%  Patient Active Problem List   Diagnosis Date Noted   Acute hypoxemic respiratory failure ([place]) 07/27/2022   PNA (pneumonia) 04/13/2022   Atrial fibrillation ([place])  02/21/2022   Hypokalemia 02/20/2022   Multifocal pneumonia 08/01/2021   HTN (hypertension) 08/01/2021   Sepsis ([place]) 08/01/2021   BPH (benign prostatic hyperplasia) 08/01/2021   COPD exacerbation ([place]) 07/22/2021   Acute on chronic respiratory failure with hypoxia ([place]) 07/22/2021   Paroxysmal atrial fibrillation ([place]) 07/22/2021   Essential hypertension 07/22/2021   History of hepatitis D 07/22/2021   Chronic respiratory failure with hypoxia ([place]) 06/29/2021   History of gastric ulcer 06/29/2021   Paroxysmal atrial fibrillation with rapid ventricular response ([place]) 06/29/2021   Rapid atrial fibrillation ([place]) 06/29/2021   Long term (current) use of anticoagulants 06/20/2021   Chest pain    Acute respiratory failure with hypoxia ([place]) 02/26/2021   Sepsis due to undetermined organism ([place]) 02/26/2021   Bacterial pneumonia 02/26/2021   Parkinson's disease 03/26/2019   Sternal fracture with retrosternal contusion, closed, initial encounter 10/31/2017   Chronic bronchitis ([place]) 05/28/2015   Gastro-esophageal reflux disease without esophagitis 05/26/2015   Insomnia, 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   COPD with acute exacerbation ([place]) 04/11/2014   Benign prostatic hypertrophy without urinary obstruction 01/21/2014   Acute bronchitis with chronic obstructive pulmonary disease (COPD) ([place]) 01/21/2014   Accelerated hypertension 01/21/2014   SINUSITIS- ACUTE-NOS 07/03/2007   RENAL CALCULUS 06/05/2007   HYPERTENSION 02/27/2007   COPD (chronic obstructive pulmonary disease) ([place]) 02/27/2007   GERD 02/27/2007   PEPTIC ULCER DISEASE 02/27/2007   BENIGN PROSTATIC HYPERTROPHY 02/27/2007   BURSITIS, RIGHT SHOULDER 02/27/2007   COLONIC POLYPS, HX OF 02/27/2007   FROZEN RIGHT SHOULDER 12/05/2006    Palliative Care Assessment & Plan   Patient Profile: 82 y.o. male  with past medical history of COPD, HFpEF, chronic respiratory failure on 2L of O2, PUD on ppi, pAfib on coumadin, recurrent multifocal  pneumonia in the last 2 years, and esophageal dysphagia admitted on 07/27/2022 with respiratory distress and cough.    Patient is a hospice patient, currently admitted for pneumonia, COPD exacerbation, and acute on chronic respiratory failure. PMT has been consulted to assist with goals of care conversation.  Assessment: Principal Problem:   Acute hypoxemic respiratory failure ([place])   Concern about end of life  Recommendations/Plan: Continue to treat the treatable Now DNR/DNI. In the event of respiratory decline, do not intubate, but rather transition to full comfort care. Ok to continue BiPap as needed Goal is for discharge back home with hospice after medical optimization Hospice liaison and TOC notified of patient's request for additional assistance in the home - hospice liaison will review resources with patient  Durable DNR form completed and placed in shadow chart. Copy was made and will be scanned into Vynca/ACP tab. EHR was updated to reflect updated goals for no intubation PMT will continue to follow peripherally. If there are any imminent needs please call the service directly  Goals of Care and Additional Recommendations: Limitations on Scope of Treatment: Avoid Hospitalization, Full Scope Treatment, No Artificial Feeding, and No Tracheostomy  Code Status:    Code Status Orders  (From admission, onward)           Start     Ordered   07/27/22 1452  Do not attempt resuscitation (DNR)  Continuous       Question Answer Comment  If patient has no pulse and is not breathing Do Not Attempt Resuscitation   If patient has a pulse and/or  is breathing: Medical Treatment Goals MEDICAL INTERVENTIONS DESIRED: Use advanced airway interventions, mechanical ventilation or cardioversion in appropriate circumstances; Use medication/IV fluids as indicated; Provide comfort medications; Transfer to Progressive/Stepdown/ICU as indicated.   Consent: Discussion documented in EHR or advanced  directives reviewed      07/27/22 1452           Code Status History     Date Active Date Inactive Code Status Order ID Comments User Context   07/27/2022 0901 07/27/2022 1452 DNR [id number]  Rick Duff, MD ED   04/13/2022 1336 04/18/2022 1710 DNR [id number]  Lequita Halt, MD ED   02/20/2022 1332 02/26/2022 2059 DNR [id number]  Collier Bullock, MD ED   08/01/2021 1448 08/04/2021 1724 Full Code [id number]  Ivor Costa, MD ED   07/22/2021 2055 07/25/2021 1647 Full Code [id number]  Mansy, Arvella Merles, MD ED   06/29/2021 0232 06/30/2021 1641 Full Code [id number]  Athena Masse, MD ED   03/09/2021 0349 03/10/2021 1847 Full Code [id number]  Athena Masse, MD ED   02/26/2021 1412 02/28/2021 2036 Full Code [id number]  Tacey Ruiz, MD ED   11/01/2017 0202 11/01/2017 2001 Full Code [id number]  Amelia Jo, MD Inpatient      Advance Directive Documentation    Flowsheet Row Most Recent Value  Type of Advance Directive Out of facility DNR (pink MOST or yellow form)  Pre-existing out of facility DNR order (yellow form or pink MOST form) Yellow form placed in chart (order not valid for inpatient use)  "MOST" Form in Place? --       Prognosis:  < 6 months  Discharge Planning: Home with Hospice pending clinical course  Care plan was discussed with primary RN, patient, hospice liaison, TOC, attending team  Thank you for allowing the Palliative Medicine Team to assist in the care of this patient.   Lin Landsman, NP  Please contact Palliative Medicine Team phone at [phone] for questions and concerns.   *Portions of this note are a verbal dictation therefore any spelling and/or grammatical errors are due to the "[place] One" system interpretation.

## 2022-07-29 ENCOUNTER — Other Ambulatory Visit (HOSPITAL_COMMUNITY): Payer: Self-pay

## 2022-07-29 DIAGNOSIS — Z87891 Personal history of nicotine dependence: Secondary | ICD-10-CM

## 2022-07-29 DIAGNOSIS — J9621 Acute and chronic respiratory failure with hypoxia: Secondary | ICD-10-CM | POA: Diagnosis not present

## 2022-07-29 DIAGNOSIS — J449 Chronic obstructive pulmonary disease, unspecified: Secondary | ICD-10-CM

## 2022-07-29 DIAGNOSIS — J189 Pneumonia, unspecified organism: Principal | ICD-10-CM

## 2022-07-29 LAB — CBC
HCT: 28.1 % — ABNORMAL LOW (ref 39.0–52.0)
Hemoglobin: 8.5 g/dL — ABNORMAL LOW (ref 13.0–17.0)
MCH: 32.2 pg (ref 26.0–34.0)
MCHC: 30.2 g/dL (ref 30.0–36.0)
MCV: 106.4 fL — ABNORMAL HIGH (ref 80.0–100.0)
Platelets: 146 10*3/uL — ABNORMAL LOW (ref 150–400)
RBC: 2.64 MIL/uL — ABNORMAL LOW (ref 4.22–5.81)
RDW: 14.5 % (ref 11.5–15.5)
WBC: 9.9 10*3/uL (ref 4.0–10.5)
nRBC: 0 % (ref 0.0–0.2)

## 2022-07-29 LAB — BASIC METABOLIC PANEL
Anion gap: 6 (ref 5–15)
BUN: 27 mg/dL — ABNORMAL HIGH (ref 8–23)
CO2: 35 mmol/L — ABNORMAL HIGH (ref 22–32)
Calcium: 8.4 mg/dL — ABNORMAL LOW (ref 8.9–10.3)
Chloride: 101 mmol/L (ref 98–111)
Creatinine, Ser: 0.95 mg/dL (ref 0.61–1.24)
GFR, Estimated: >60 mL/min (ref >60)
Glucose, Bld: 122 mg/dL — ABNORMAL HIGH (ref 70–99)
Potassium: 3.4 mmol/L — ABNORMAL LOW (ref 3.5–5.1)
Sodium: 142 mmol/L (ref 135–145)

## 2022-07-29 LAB — MAGNESIUM: Magnesium: 2.1 mg/dL (ref 1.7–2.4)

## 2022-07-29 MED ORDER — METOPROLOL TARTRATE 25 MG PO TABS
25.0000 mg | ORAL_TABLET | Freq: Two times a day (BID) | ORAL | 0 refills | Status: DC
  Filled 2022-07-29: qty 60, 30d supply, fill #0

## 2022-07-29 MED ORDER — GLYCOPYRROLATE 1 MG PO TABS
0.5000 mg | ORAL_TABLET | Freq: Three times a day (TID) | ORAL | 0 refills | Status: AC | PRN
  Filled 2022-07-29: qty 45, 30d supply, fill #0

## 2022-07-29 MED ORDER — ONDANSETRON HCL 4 MG PO TABS
4.0000 mg | ORAL_TABLET | Freq: Four times a day (QID) | ORAL | 0 refills | Status: DC | PRN
  Filled 2022-07-29: qty 20, 5d supply, fill #0

## 2022-07-29 MED ORDER — PREDNISONE 20 MG PO TABS
40.0000 mg | ORAL_TABLET | Freq: Every day | ORAL | 0 refills | Status: AC
  Filled 2022-07-29: qty 4, 2d supply, fill #0

## 2022-07-29 MED ORDER — AMIODARONE HCL 200 MG PO TABS
100.0000 mg | ORAL_TABLET | Freq: Every day | ORAL | 0 refills | Status: DC
  Filled 2022-07-29: qty 15, 30d supply, fill #0

## 2022-07-29 MED ORDER — CEFDINIR 300 MG PO CAPS
300.0000 mg | ORAL_CAPSULE | Freq: Two times a day (BID) | ORAL | 0 refills | Status: AC
  Filled 2022-07-29: qty 4, 2d supply, fill #0

## 2022-07-29 MED ORDER — PREDNISONE 10 MG PO TABS: 10.0000 mg | ORAL_TABLET | Freq: Every day | ORAL | 0 refills | Status: DC

## 2022-07-29 MED ORDER — POTASSIUM CHLORIDE 20 MEQ PO PACK
40.0000 meq | PACK | Freq: Once | ORAL | Status: AC
  Administered 2022-07-29: 40 meq via ORAL
  Filled 2022-07-29: qty 2

## 2022-07-29 MED ORDER — AZITHROMYCIN 500 MG PO TABS
500.0000 mg | ORAL_TABLET | Freq: Every day | ORAL | 0 refills | Status: AC
  Filled 2022-07-29: qty 2, 2d supply, fill #0

## 2022-07-29 NOTE — Discharge Summary (Addendum)
Name: Patrick Montoya MRN: [id number] DOB: [birth year] 82 y.o. PCP: Tracie Harrier, MD  Date of Admission: 07/27/2022  5:48 AM Date of Discharge: 07/29/2022 Attending Physician: Dr. Angelia Mould  Discharge Diagnosis: Principal Problem:   Acute on chronic hypoxic respiratory failure ([place]) Active Problems:   COPD (chronic obstructive pulmonary disease) ([place])   CAP (community acquired pneumonia)    Discharge Medications: Allergies as of 07/29/2022   No Known Allergies      Medication List     STOP taking these medications    doxycycline 100 MG tablet Commonly known as: ADOXA       TAKE these medications    albuterol 108 (90 Base) MCG/ACT inhaler Commonly known as: VENTOLIN HFA Inhale 2 puffs into the lungs every 6 (six) hours as needed for wheezing or shortness of breath.   ALPRAZolam 0.5 MG tablet Commonly known as: XANAX Take 0.5 mg by mouth at bedtime.   amiodarone 200 MG tablet Commonly known as: PACERONE Take 0.5 tablets (100 mg total) by mouth daily. What changed:  medication strength Another medication with the same name was removed. Continue taking this medication, and follow the directions you see here.   apixaban 5 MG Tabs tablet Commonly known as: ELIQUIS Take 5 mg by mouth 2 (two) times daily.   azithromycin 500 MG tablet Commonly known as: Zithromax Take 1 tablet by mouth daily for 2 days. Start taking on: July 30, 2022   budesonide-formoterol 160-4.5 MCG/ACT inhaler Commonly known as: Symbicort Inhale 2 puffs into the lungs 2 (two) times daily.   carbidopa-levodopa 25-100 MG tablet Commonly known as: SINEMET IR Take 2 tablets by mouth 5 (five) times daily. Takes at 0800, 1200, 1600, 2000, and 00:00 (midnight)   cefdinir 300 MG capsule Commonly known as: OMNICEF Take 1 capsule (300 mg total) by mouth 2 (two) times daily for 2 days. Start taking on: July 30, 2022   [employer] Vitamin B-12 2000 MCG Tbcr Generic drug: Cyanocobalamin Take  2,000 mcg by mouth daily.   doxazosin 4 MG tablet Commonly known as: CARDURA Take 4 mg by mouth daily.   erythromycin ophthalmic ointment Place 1 Application into the left eye at bedtime.   ferrous gluconate 324 MG tablet Commonly known as: FERGON Take 324 mg by mouth daily with breakfast.   glycopyrrolate 1 MG tablet Commonly known as: ROBINUL Take 0.5 tablets (0.5 mg total) by mouth 3 (three) times daily as needed (excessive secretions).   guaiFENesin 600 MG 12 hr tablet Commonly known as: MUCINEX Take 600 mg by mouth 2 (two) times daily.   ipratropium-albuterol 0.5-2.5 (3) MG/3ML Soln Commonly known as: DUONEB Take 3 mLs by nebulization every 2 (two) hours as needed (ehzzeing / shortness of breath).   metoprolol tartrate 25 MG tablet Commonly known as: LOPRESSOR Take 1 tablet (25 mg total) by mouth 2 (two) times daily. What changed: Another medication with the same name was removed. Continue taking this medication, and follow the directions you see here.   montelukast 10 MG tablet Commonly known as: SINGULAIR Take 10 mg by mouth at bedtime.   morphine CONCENTRATE 10 mg / 0.5 ml concentrated solution Take 5 mg by mouth every 2 (two) hours as needed for shortness of breath (pain). Take 0.68ml (5mg )   omeprazole 40 MG capsule Commonly known as: PRILOSEC Take 40 mg by mouth daily.   ondansetron 4 MG tablet Commonly known as: ZOFRAN Take 1 tablet (4 mg total) by mouth every 6 (six) hours as needed  for nausea.   predniSONE 20 MG tablet Commonly known as: DELTASONE Take 2 tablets (40 mg total) by mouth daily with breakfast for 2 days. Start taking on: July 30, 2022 What changed:  medication strength how much to take   QUEtiapine 50 MG tablet Commonly known as: SEROQUEL Take 50 mg by mouth at bedtime.   roflumilast 500 MCG Tabs tablet Commonly known as: DALIRESP Take 500 mcg by mouth daily.   sennosides-docusate sodium 8.6-50 MG tablet Commonly known as:  SENOKOT-S Take 1 tablet by mouth daily.   sorbitol 70 % solution Take 30 mLs by mouth daily as needed (constipation).   SUMAtriptan 100 MG tablet Commonly known as: IMITREX Take 100 mg by mouth every 2 (two) hours as needed for migraine.        Disposition and follow-up:   [name] was discharged from [facility] in Stable condition.  At the hospital follow up visit please address:  1.  Follow-up:  a. Acute on chronic hypoxemic and hypercapnic RF/COPD/PNA: ensure completion of antibiotics and prednisone, assess O2 requirement, patient has home hospice in place     b. pAFib: ensure on home Eliquis, metoprolol and amiodarone    c. Home hospice: patient is DNR and has home hospice in place   2.  Labs / imaging needed at time of follow-up: none  3.  Pending labs/ test needing follow-up: urine Legionella, blood cultures  4.  Medication Changes  -Azithromycin 500 mg daily for 2 days (3/19-3/20) -Cefdinir 300 mg twice a day for 2 days  (3/19-3/20) -Prednisone 40 mg (2 tablets) daily for 2 days (3/19-3/20) -Zofran 4 mg every 6 hours as needed for nausea -Glycopyrrolate 0.5 mg three times daily as needed for excessive secretions  Follow-up Appointments:  Follow-up Information     Tracie Harrier, MD. Schedule an appointment as soon as possible for a visit.   Specialty: Internal Medicine Contact information: [address] [place] [address] [phone]         [facility] Follow up.   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   #Acute on chronic hypoxemic and hypercapnic RF #COPD exacerbation  #PNA (recurrent) Patient with increased coughing and sputum production over the past couple of weeks and worsening dyspnea. He was started on doxycycline by his PCP 2 days prior to admission. Patient found to be hypoxemic and hypercapnic on admission with CXR showing bilateral basilar opacities. Negative RVP and strep pneumo Ag. Blood cultures to date (3/18) have been no growth. He was placed on BiPAP but transitioned to [place]. Started CAP coverage and steroids. Patient's respiratory status improved and remains on 3-4L [place]. At discharge, patient will need to finish antibiotic and prednisone course for 2 more days. He was on 3-4L [place] but anticipate he will return to his home 2L . Patient is followed by home hospice care. Patient met with palliative care team and code status now is DNR/DNI with limited interventions. Patient would like to continue treatment  with antibiotics and steroid.     #pAFib  Patient is on metoprolol, amiodarone and Eliquis. We continued his amiodarone and Eliquis. Metoprolol was adjusted to 25 mg BID and his BP and HR stable.    #HFpEF CXR showed possibly pulmonary edema, was given IV lasix on admission with adequate urine output. Patient appears euvolemic on exam so no further diuresis.    #Parkinson's Disease #H/o esophageal dysphagia  Patient is on mechanical soft diet/dysphagia 3. He is taking sinimet at home.    #h/o PUD We continued home PPI.   #Hospice Patient is a current patient with [facility] with terminal diagnosis of COPD. Patient discussed with inpatient palliative care team. Code status was clarified to be DNR/DNI with limited interventions. Patient wants to continue antibiotics and steroid treatment but no longer wants intubation in event of decline. Patient will be discharged with hospice support at home.     Discharge Subjective: Patient  was awake and alert. He states his breathing has improved but still have productive cough with small streaks of blood. Denies hematemesis or vomiting in general.   Discharge Exam:   BP (!) 126/91   Pulse 67   Temp 98.9 F (37.2 C) (Oral)   Resp (!) 23   Ht 5\' 9"  (1.753 m)   Wt 75.6 kg   SpO2 92%   BMI 24.61 kg/m  Constitutional: chronically ill-appearing male, in no acute distress Cardiovascular: regular rate and rhythm Pulmonary/Chest: normal work of breathing on 4L [place], diminished breath sounds bilaterally MSK: no LE edema  Neurological: awake and alert Skin: warm and dry Psych: pleasant mood   Pertinent Labs, Studies, and Procedures:     Latest Ref Rng & Units 07/29/2022    2:33 AM 07/28/2022    2:28 AM 07/27/2022    6:10 AM  CBC  WBC 4.0 - 10.5 K/uL 9.9  12.3    Hemoglobin 13.0 - 17.0 g/dL 8.5  8.6  11.6   Hematocrit 39.0 - 52.0 % 28.1  29.7  34.0   Platelets 150 - 400 K/uL 146  153         Latest Ref Rng & Units 07/29/2022    2:33 AM 07/28/2022    2:28 AM 07/27/2022    6:10 AM  CMP  Glucose 70 - 99 mg/dL 122  135    BUN 8 - 23 mg/dL 27  19    Creatinine 0.61 - 1.24 mg/dL 0.95  0.83    Sodium 135 - 145 mmol/L 142  142  144   Potassium 3.5 - 5.1 mmol/L 3.4  3.6  3.9   Chloride 98 - 111 mmol/L 101  103    CO2 22 - 32 mmol/L 35  33    Calcium 8.9 - 10.3 mg/dL 8.4  8.6    Total Protein 6.5 - 8.1 g/dL  5.4    Total Bilirubin 0.3 - 1.2 mg/dL  0.5    Alkaline Phos 38 - 126 U/L  42    AST 15 - 41 U/L  18    ALT 0 - 44 U/L  7      DG Chest Port 1 View  Result Date: 07/27/2022 CLINICAL DATA:  Shortness of breath EXAM: PORTABLE CHEST 1 VIEW COMPARISON:  04/13/2022 FINDINGS: Basal pulmonary opacity asymmetric to the left. No effusion or pneumothorax. Normal heart size and mediastinal contours. IMPRESSION: 1. Pneumonia on the left more than right. 2. Emphysema. Electronically Signed   By: Jorje Guild M.D.   On: 07/27/2022  06:38     Discharge Instructions: Discharge  Instructions     Increase activity slowly   Complete by: As directed        Signed: Angelique Blonder, DO 07/29/2022, 1:30 PM   Pager: [phone]

## 2022-07-29 NOTE — TOC Initial Note (Signed)
Transition of Care [facility]) - Initial/Assessment Note    Patient Details  Name: Patrick Montoya MRN: [id number] Date of Birth: 1940-05-31  Transition of [facility]) CM/SW Contact:    Bethena Roys, RN Phone Number: 07/29/2022, 2:28 PM  Clinical Narrative: Patient will transition home with Hospice Services- active with [facility]. Liaison has spoken with family and they are aware of discharge. Hospice Liaison has called transport [facility]. No further home needs identified at this time.              Expected Discharge Plan: [place]  Patient Goals and CMS Choice Patient states their goals for this hospitalization and ongoing recovery are:: to return home with hospice services.  Expected Discharge Plan and Services   Discharge Planning Services: CM Consult Post Acute Care Choice: Hospice, Resumption of Svcs/PTA Provider Living arrangements for the past 2 months: Single Family Home Expected Discharge Date: 07/29/22                 HH Arranged: RN HH Agency: [facility]ice and [place] ([place]) Date [facility] Agency Contacted: 07/29/22 Time HH Agency Contacted: 78 Representative spoke with at [place]: [place] Arrangements/Services Living arrangements for the past 2 months: [place] with:: Spouse Patient language and need for interpreter reviewed:: Yes Do you feel safe going back to the place where you live?: Yes      Need for Family Participation in Patient Care: Yes (Comment) Care giver support system in place?: Yes (comment) Current home services: Hospice Criminal Activity/Legal Involvement Pertinent to Current Situation/Hospitalization: No - Comment as needed  Activities of Daily Living Home Assistive Devices/Equipment: Oxygen ADL Screening (condition at time of admission) Patient's cognitive ability adequate to safely complete daily activities?: Yes Is the patient deaf or have difficulty hearing?:  No Does the patient have difficulty seeing, even when wearing glasses/contacts?: No Does the patient have difficulty concentrating, remembering, or making decisions?: No Patient able to express need for assistance with ADLs?: Yes Does the patient have difficulty dressing or bathing?: Yes Independently performs ADLs?: Yes (appropriate for developmental age) (with some help) Does the patient have difficulty walking or climbing stairs?: Yes Weakness of Legs: None Weakness of Arms/Hands: None  Permission Sought/Granted Permission sought to share information with : Case Manager, Customer service manager, Family Supports Permission granted to share information with : Yes, Verbal Permission Granted   Emotional Assessment Appearance:: Appears stated age Attitude/Demeanor/Rapport: Engaged   Psych Involvement: No (comment)  Admission diagnosis:  Acute respiratory failure with hypoxia and hypercapnia ([place]) [J96.01, J96.02] Acute hypoxemic respiratory failure ([place]) [J96.01] Multifocal pneumonia [J18.9] Patient Active Problem List   Diagnosis Date Noted   Acute on chronic hypoxic respiratory failure ([place]) 07/27/2022   CAP (community acquired pneumonia) 04/13/2022   Atrial fibrillation ([place]) 02/21/2022   Hypokalemia 02/20/2022   Multifocal pneumonia 08/01/2021   HTN (hypertension) 08/01/2021   Sepsis ([place]) 08/01/2021   BPH (benign prostatic hyperplasia) 08/01/2021   COPD exacerbation ([place]) 07/22/2021   Acute on chronic respiratory failure with hypoxia ([place]) 07/22/2021   Paroxysmal atrial fibrillation ([place]) 07/22/2021   Essential hypertension 07/22/2021   History of hepatitis D 07/22/2021   Chronic respiratory failure with hypoxia ([place]) 06/29/2021   History of gastric ulcer 06/29/2021   Paroxysmal atrial fibrillation with rapid ventricular response ([place]) 06/29/2021   Rapid atrial fibrillation ([place]) 06/29/2021   Long term (current) use of anticoagulants 06/20/2021   Chest pain  Acute respiratory failure with hypoxia ([place]) 02/26/2021   Sepsis due to undetermined organism ([place]) 02/26/2021   Bacterial pneumonia 02/26/2021   Parkinson's disease 03/26/2019   Sternal fracture with retrosternal contusion, closed, initial encounter 10/31/2017   Chronic bronchitis ([place]) 05/28/2015   Gastro-esophageal reflux disease without esophagitis 05/26/2015   Insomnia, 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   COPD with acute exacerbation ([place]) 04/11/2014   Benign prostatic hypertrophy without urinary obstruction 01/21/2014   Acute bronchitis with chronic obstructive pulmonary disease (COPD) ([place]) 01/21/2014   Accelerated hypertension 01/21/2014   SINUSITIS- ACUTE-NOS 07/03/2007   RENAL CALCULUS 06/05/2007   HYPERTENSION 02/27/2007   COPD (chronic obstructive pulmonary disease) ([place]) 02/27/2007   GERD 02/27/2007   PEPTIC ULCER DISEASE 02/27/2007   BENIGN PROSTATIC HYPERTROPHY 02/27/2007   BURSITIS, RIGHT SHOULDER 02/27/2007   COLONIC POLYPS, HX OF 02/27/2007   FROZEN RIGHT SHOULDER 12/05/2006   PCP:  Tracie Harrier, MD Pharmacy:   [facility] [address], [place] - [place] [address] [place] [address] Phone: [phone] Fax: [place] [address]), [place]) [place] [address] Phone: [phone] Fax: [phone]  [facility] [address] [place] [address] Phone: [phone] Fax: [phone]  Social Determinants of Health (SDOH) Social History: SDOH Screenings   Food Insecurity: No Food Insecurity (07/27/2022)  Housing: Low Risk  (07/27/2022)  Transportation Needs: No Transportation Needs (07/27/2022)  Utilities: Not At Risk (07/27/2022)  Tobacco Use: Medium Risk (04/14/2022)   Readmission Risk Interventions    02/25/2022   12:00 PM 07/25/2021    9:58 AM  Readmission Risk Prevention Plan  Transportation Screening Complete  Complete  PCP or Specialist Appt within 3-5 Days Complete Complete  HRI or [place] Complete Complete  Social Work Consult for [place] Planning/Counseling Complete   Palliative Care Screening Complete Not Applicable  Medication Review Press photographer) Complete Complete

## 2022-07-29 NOTE — Discharge Instructions (Addendum)
You were hospitalized for shortness of breath. We treated your pneumonia and COPD. You will complete 2 more days of antibiotics and prednisone. Your breathing has improved and now required less supplemental oxygen. You may need about 3-4 L of oxygen when you return home for a short time as you recover then you may return to your home 2L oxygen. You will continue receiving hospice care when you return home. Thank you for allowing [place] to be part of your care.   Please note these changes made to your medications:  *Please START taking:  -Azithromycin 500 mg daily for 2 days -Cefdinir 300 mg twice a day for 2 days -Prednisone 40 mg (2 tablets) daily for 2 days -As needed medications include: Zofran 4 mg every 6 hours as needed for nausea, Glycopyrrolate 0.5 mg three times daily as needed for excessive secretions  *Please STOP taking:  -Doxycycline

## 2022-07-30 LAB — LEGIONELLA PNEUMOPHILA SEROGP 1 UR AG: L. pneumophila Serogp 1 Ur Ag: NEGATIVE

## 2022-07-30 IMAGING — DX DG CHEST 1V PORT
1 series · 1 of 1 positions shown · non-contrast
Comparison: Chest radiograph dated 06/29/2021.

CLINICAL DATA: Cough.

EXAM:
PORTABLE CHEST 1 VIEW

[chest ap]
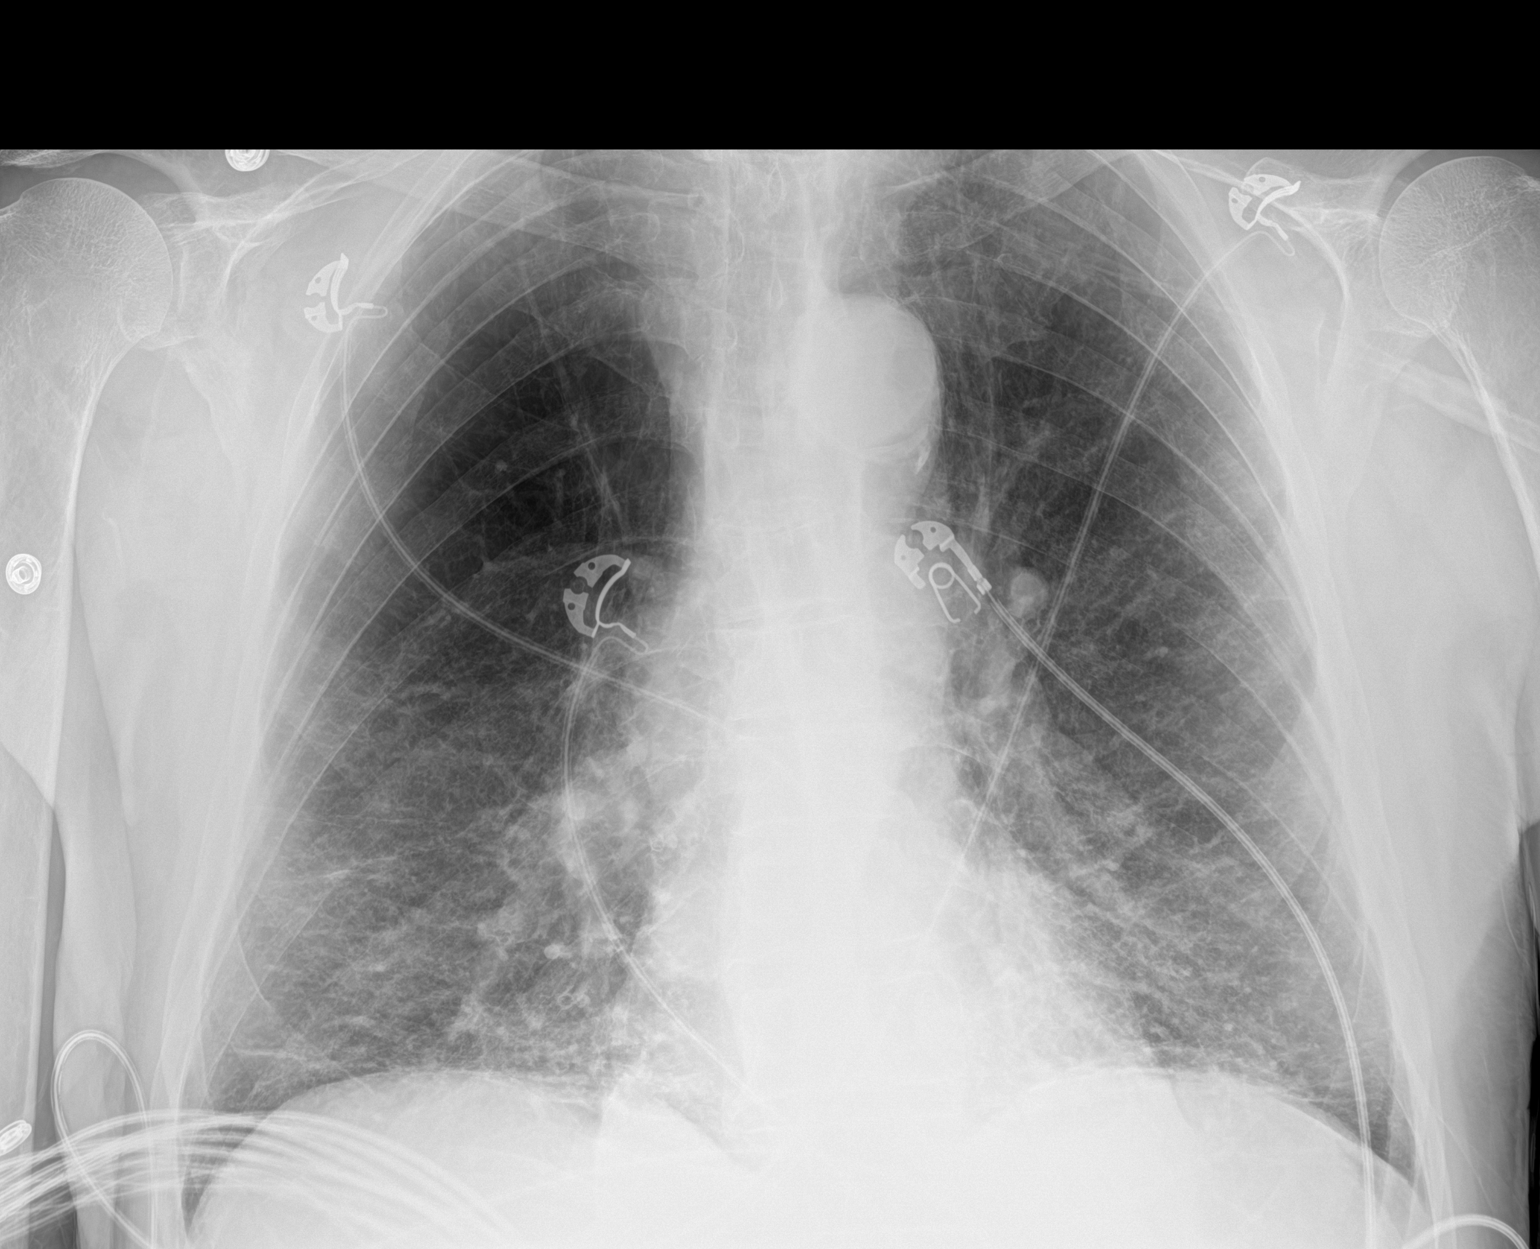

[1 of 1 positions shown; findings below may reference images not displayed]

FINDINGS: Background of emphysema. Bilateral mid to lower lung field
interstitial coarsening, atelectasis, and scarring. No focal
consolidation, pleural effusion, or pneumothorax. The cardiac
silhouette is within normal limits. Atherosclerotic calcification of
the aorta. No acute osseous pathology.
IMPRESSION: 1. No acute cardiopulmonary process.
2. Emphysema.

## 2022-08-01 LAB — CULTURE, BLOOD (ROUTINE X 2)
Culture: NO GROWTH
Culture: NO GROWTH
Special Requests: ADEQUATE
Special Requests: ADEQUATE

## 2022-08-09 IMAGING — CT CT ANGIO CHEST
2 of 6 series · 17 of 46 positions shown · IV contrast (APPLIED)
Comparison: 02/26/2021

CLINICAL DATA: Fever, chest pain, shortness of breath

EXAM:
CT ANGIOGRAPHY CHEST WITH CONTRAST
TECHNIQUE: Multidetector CT imaging of the chest was performed using the
standard protocol during bolus administration of intravenous
contrast. Multiplanar CT image reconstructions and MIPs were
obtained to evaluate the vascular anatomy.

[Series 7: thins · axial · 0.71mm/px · z∈[+1009,+1302]mm · 14 of 458 slices shown]
[im 20/458  lung]
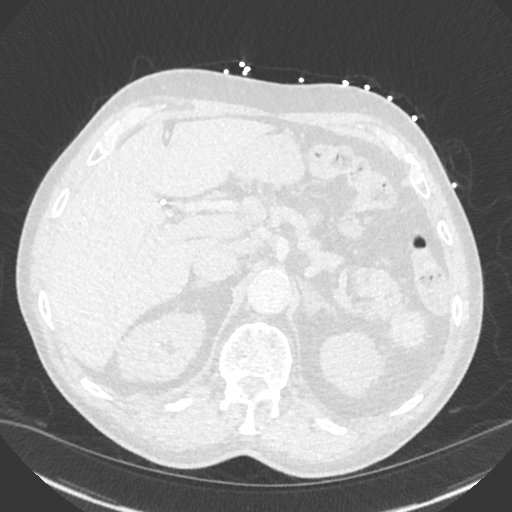
[im 60/458  soft-tissue]
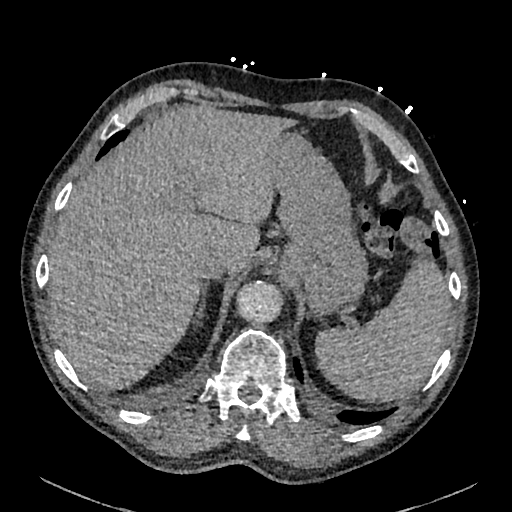
[im 80/458  lung]
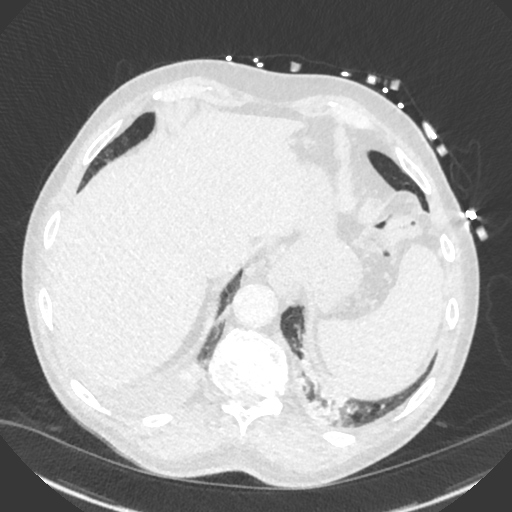
[im 120/458  soft-tissue]
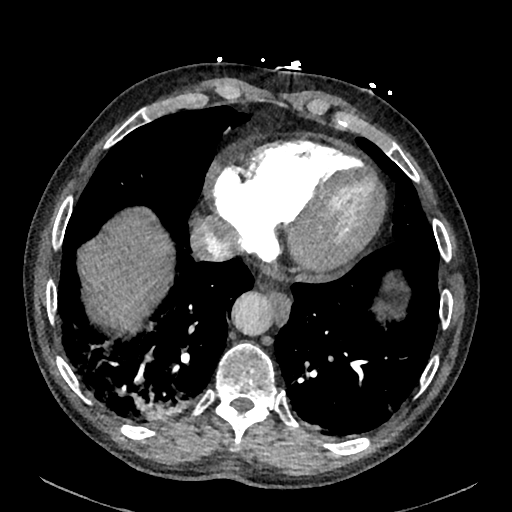
[im 159/458  lung]
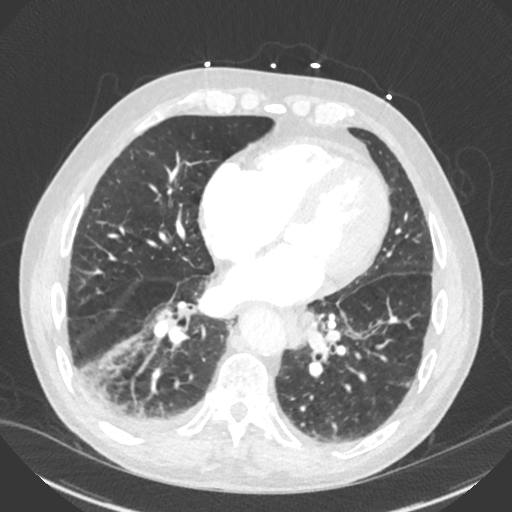
[im 179/458  soft-tissue]
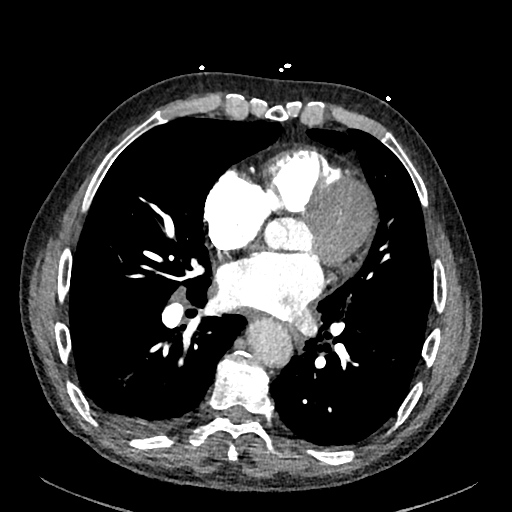
[im 219/458  lung]
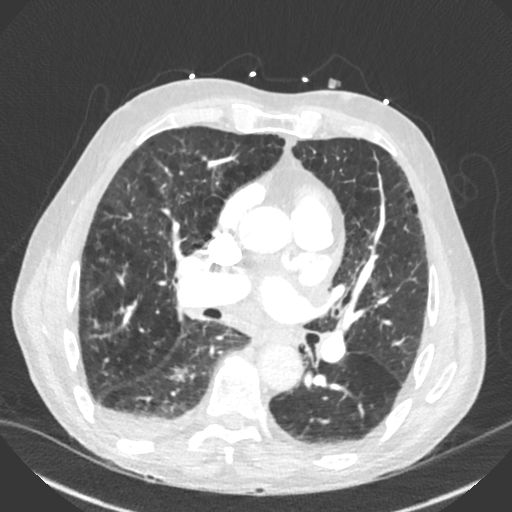
[im 239/458  soft-tissue]
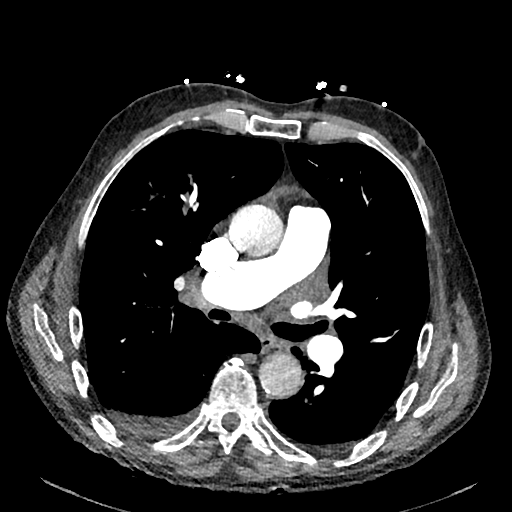
[im 279/458  lung]
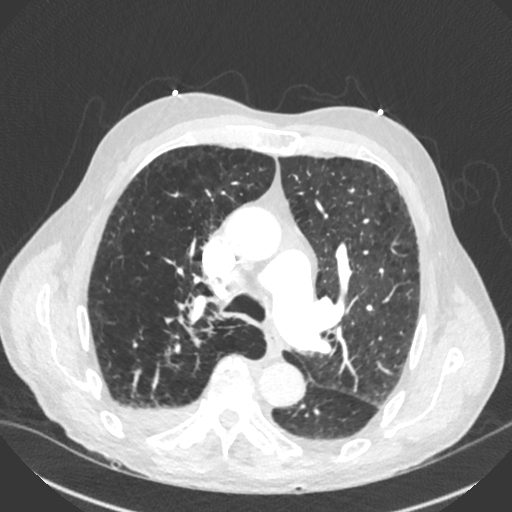
[im 299/458  soft-tissue]
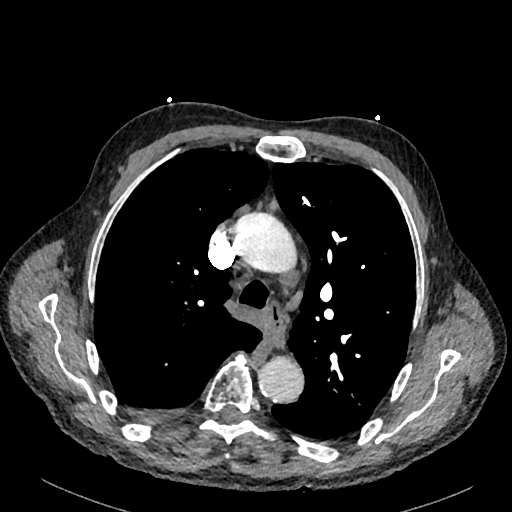
[im 338/458  lung]
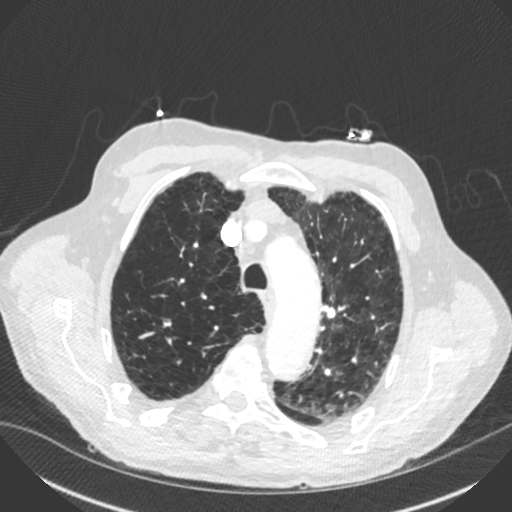
[im 378/458  soft-tissue]
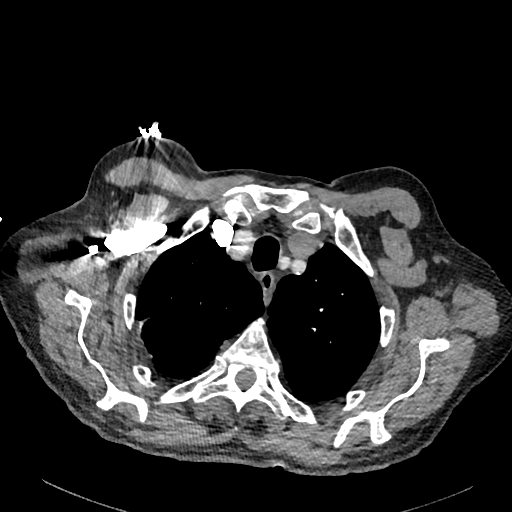
[im 398/458  lung]
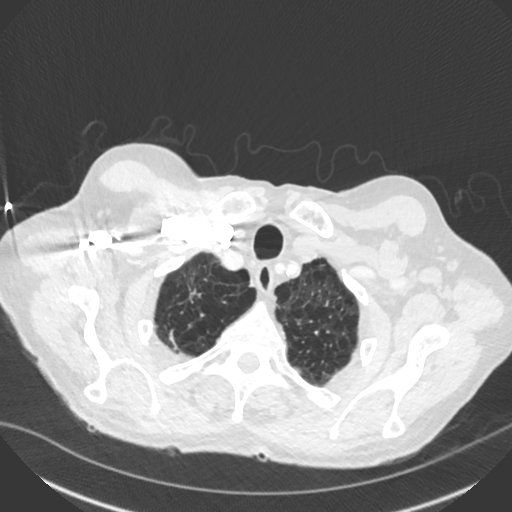
[im 438/458  soft-tissue]
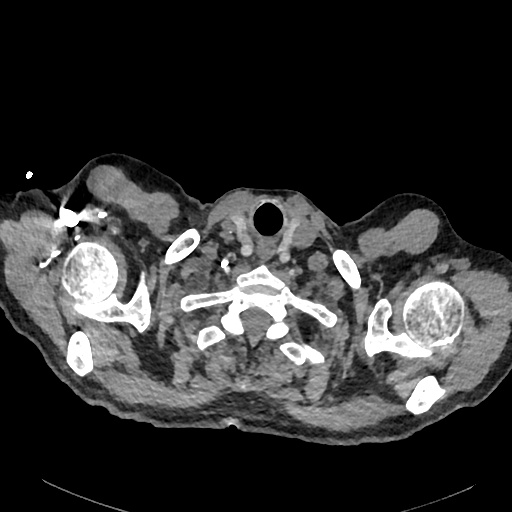

[Series 8: cor · coronal · 0.60mm/px · 3 of 155 slices shown]
[im 39/155  soft-tissue]
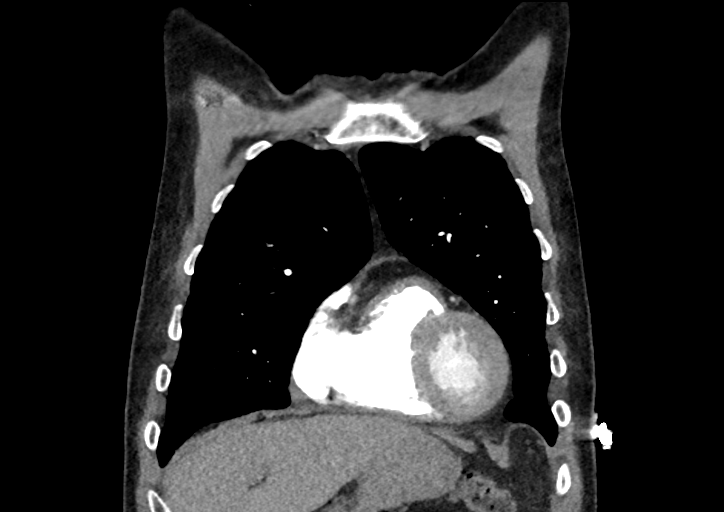
[im 78/155  soft-tissue]
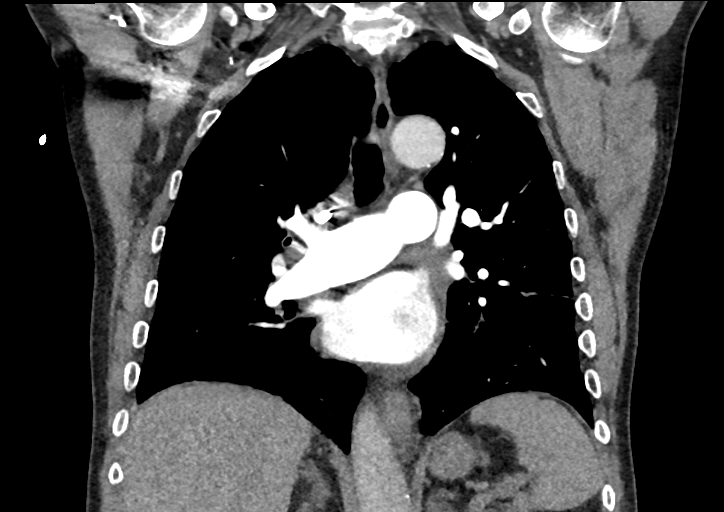
[im 116/155  soft-tissue]
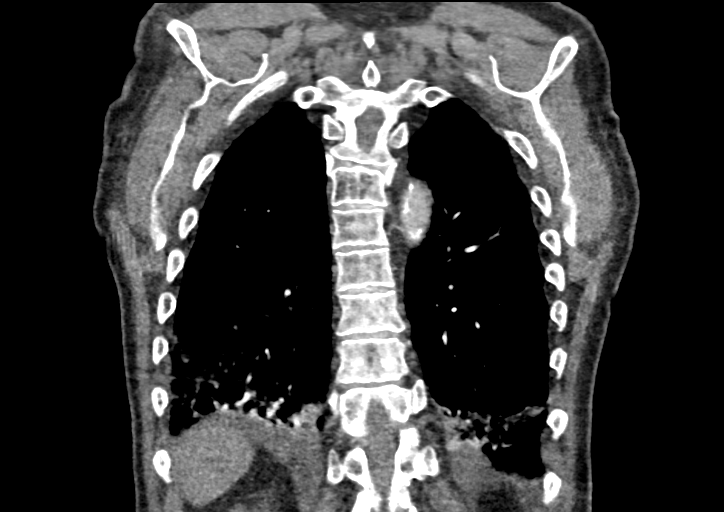

[17 of 46 positions shown; findings below may reference images not displayed]

RADIATION DOSE REDUCTION: This exam was performed according to the
departmental dose-optimization program which includes automated
exposure control, adjustment of the mA and/or kV according to
patient size and/or use of iterative reconstruction technique.

CONTRAST:  75mL OMNIPAQUE IOHEXOL 350 MG/ML SOLN
FINDINGS: Cardiovascular: There is homogeneous enhancement in thoracic aorta.
There is ectasia of main pulmonary artery measuring 3.7 cm
suggesting pulmonary arterial hypertension. There are no
intraluminal filling defects in the pulmonary artery branches.
Evaluation of small peripheral branches in the lower lung fields is
limited by patchy infiltrates. Coronary artery calcifications are
seen.

Mediastinum/Nodes: No new significant lymphadenopathy is seen.

Lungs/Pleura: Centrilobular and panlobular emphysema is seen. There
are patchy infiltrates in the lower lung fields, more so on the
right side suggesting atelectasis/pneumonia. Small patchy
infiltrates are seen in the right upper lobe and left upper lobe.
There are few scattered blebs and bullae. In the image 90 of series
6, there is a 5 mm nodule in the right mid lung fields which has not
changed significantly in comparison with previous studies dating as
far back as 04/18/2017. Small right pleural effusion is seen. There
is possible minimal left pleural effusion. There is no pneumothorax.

Upper Abdomen: Surgical clips are seen in gallbladder fossa. Small
hiatal hernia is seen.

Musculoskeletal: Unremarkable.

Review of the MIP images confirms the above findings.
IMPRESSION: There is no evidence of pulmonary artery embolism. There is no
evidence of thoracic aortic dissection.

There are patchy infiltrates in both lungs, more so in the right
lower lobe suggesting multifocal pneumonia. Small bilateral pleural
effusions, more so on the right side.

COPD. Coronary artery disease. There is ectasia of main pulmonary
artery suggesting pulmonary arterial hypertension.

Other findings as described in the body of the report.

## 2023-01-15 ENCOUNTER — Ambulatory Visit: Payer: Medicare Other | Admitting: Dermatology

## 2023-03-30 ENCOUNTER — Other Ambulatory Visit: Payer: Self-pay

## 2023-03-30 ENCOUNTER — Emergency Department: Payer: Medicare Other

## 2023-03-30 ENCOUNTER — Inpatient Hospital Stay
Admission: EM | Admit: 2023-03-30 | Discharge: 2023-04-03 | DRG: 177 | Disposition: A | Discharge status: Hospice | Attending: Internal Medicine | Admitting: Internal Medicine

## 2023-03-30 DIAGNOSIS — Z87891 Personal history of nicotine dependence: Secondary | ICD-10-CM | POA: Diagnosis not present

## 2023-03-30 DIAGNOSIS — J189 Pneumonia, unspecified organism: Principal | ICD-10-CM

## 2023-03-30 DIAGNOSIS — I10 Essential (primary) hypertension: Secondary | ICD-10-CM | POA: Diagnosis present

## 2023-03-30 DIAGNOSIS — K279 Peptic ulcer, site unspecified, unspecified as acute or chronic, without hemorrhage or perforation: Secondary | ICD-10-CM | POA: Diagnosis present

## 2023-03-30 DIAGNOSIS — J690 Pneumonitis due to inhalation of food and vomit: Secondary | ICD-10-CM | POA: Diagnosis present

## 2023-03-30 DIAGNOSIS — J9621 Acute and chronic respiratory failure with hypoxia: Secondary | ICD-10-CM | POA: Diagnosis present

## 2023-03-30 DIAGNOSIS — Z7952 Long term (current) use of systemic steroids: Secondary | ICD-10-CM

## 2023-03-30 DIAGNOSIS — G4700 Insomnia, unspecified: Secondary | ICD-10-CM | POA: Diagnosis present

## 2023-03-30 DIAGNOSIS — R519 Headache, unspecified: Secondary | ICD-10-CM | POA: Diagnosis not present

## 2023-03-30 DIAGNOSIS — K219 Gastro-esophageal reflux disease without esophagitis: Secondary | ICD-10-CM | POA: Diagnosis present

## 2023-03-30 DIAGNOSIS — Z66 Do not resuscitate: Secondary | ICD-10-CM | POA: Diagnosis present

## 2023-03-30 DIAGNOSIS — I5032 Chronic diastolic (congestive) heart failure: Secondary | ICD-10-CM | POA: Diagnosis present

## 2023-03-30 DIAGNOSIS — J439 Emphysema, unspecified: Secondary | ICD-10-CM | POA: Diagnosis present

## 2023-03-30 DIAGNOSIS — G20B1 Parkinson's disease with dyskinesia, without mention of fluctuations: Secondary | ICD-10-CM | POA: Diagnosis not present

## 2023-03-30 DIAGNOSIS — D500 Iron deficiency anemia secondary to blood loss (chronic): Secondary | ICD-10-CM | POA: Diagnosis present

## 2023-03-30 DIAGNOSIS — Z1152 Encounter for screening for COVID-19: Secondary | ICD-10-CM

## 2023-03-30 DIAGNOSIS — Z85828 Personal history of other malignant neoplasm of skin: Secondary | ICD-10-CM | POA: Diagnosis not present

## 2023-03-30 DIAGNOSIS — I110 Hypertensive heart disease with heart failure: Secondary | ICD-10-CM | POA: Diagnosis present

## 2023-03-30 DIAGNOSIS — N400 Enlarged prostate without lower urinary tract symptoms: Secondary | ICD-10-CM | POA: Diagnosis present

## 2023-03-30 DIAGNOSIS — B379 Candidiasis, unspecified: Secondary | ICD-10-CM | POA: Diagnosis present

## 2023-03-30 DIAGNOSIS — K2100 Gastro-esophageal reflux disease with esophagitis, without bleeding: Secondary | ICD-10-CM | POA: Diagnosis not present

## 2023-03-30 DIAGNOSIS — G20A1 Parkinson's disease without dyskinesia, without mention of fluctuations: Secondary | ICD-10-CM | POA: Diagnosis present

## 2023-03-30 DIAGNOSIS — Z7901 Long term (current) use of anticoagulants: Secondary | ICD-10-CM

## 2023-03-30 DIAGNOSIS — D509 Iron deficiency anemia, unspecified: Secondary | ICD-10-CM | POA: Insufficient documentation

## 2023-03-30 DIAGNOSIS — Z7951 Long term (current) use of inhaled steroids: Secondary | ICD-10-CM | POA: Diagnosis not present

## 2023-03-30 DIAGNOSIS — R8281 Pyuria: Secondary | ICD-10-CM | POA: Insufficient documentation

## 2023-03-30 DIAGNOSIS — Z79899 Other long term (current) drug therapy: Secondary | ICD-10-CM | POA: Diagnosis not present

## 2023-03-30 DIAGNOSIS — I480 Paroxysmal atrial fibrillation: Secondary | ICD-10-CM | POA: Diagnosis present

## 2023-03-30 DIAGNOSIS — B370 Candidal stomatitis: Secondary | ICD-10-CM | POA: Insufficient documentation

## 2023-03-30 DIAGNOSIS — J441 Chronic obstructive pulmonary disease with (acute) exacerbation: Secondary | ICD-10-CM | POA: Diagnosis present

## 2023-03-30 LAB — URINALYSIS, W/ REFLEX TO CULTURE (INFECTION SUSPECTED)
Bacteria, UA: NONE SEEN
Bilirubin Urine: NEGATIVE
Glucose, UA: NEGATIVE mg/dL
Hgb urine dipstick: NEGATIVE
Ketones, ur: NEGATIVE mg/dL
Nitrite: NEGATIVE
Protein, ur: NEGATIVE mg/dL
Specific Gravity, Urine: 1.011 (ref 1.005–1.030)
pH: 6 (ref 5.0–8.0)

## 2023-03-30 LAB — COMPREHENSIVE METABOLIC PANEL
ALT: <5 U/L (ref 0–44)
AST: 21 U/L (ref 15–41)
Albumin: 3.7 g/dL (ref 3.5–5.0)
Alkaline Phosphatase: 71 U/L (ref 38–126)
Anion gap: 9 (ref 5–15)
BUN: 22 mg/dL (ref 8–23)
CO2: 29 mmol/L (ref 22–32)
Calcium: 8.5 mg/dL — ABNORMAL LOW (ref 8.9–10.3)
Chloride: 102 mmol/L (ref 98–111)
Creatinine, Ser: 0.85 mg/dL (ref 0.61–1.24)
GFR, Estimated: >60 mL/min (ref >60)
Glucose, Bld: 143 mg/dL — ABNORMAL HIGH (ref 70–99)
Potassium: 3.9 mmol/L (ref 3.5–5.1)
Sodium: 140 mmol/L (ref 135–145)
Total Bilirubin: 0.5 mg/dL (ref <1.2)
Total Protein: 7 g/dL (ref 6.5–8.1)

## 2023-03-30 LAB — CBC WITH DIFFERENTIAL/PLATELET
Abs Immature Granulocytes: 0.03 10*3/uL (ref 0.00–0.07)
Basophils Absolute: 0 10*3/uL (ref 0.0–0.1)
Basophils Relative: 0 %
Eosinophils Absolute: 0.1 10*3/uL (ref 0.0–0.5)
Eosinophils Relative: 2 %
HCT: 25.7 % — ABNORMAL LOW (ref 39.0–52.0)
Hemoglobin: 7.7 g/dL — ABNORMAL LOW (ref 13.0–17.0)
Immature Granulocytes: 0 %
Lymphocytes Relative: 10 %
Lymphs Abs: 0.9 10*3/uL (ref 0.7–4.0)
MCH: 29.2 pg (ref 26.0–34.0)
MCHC: 30 g/dL (ref 30.0–36.0)
MCV: 97.3 fL (ref 80.0–100.0)
Monocytes Absolute: 0.3 10*3/uL (ref 0.1–1.0)
Monocytes Relative: 4 %
Neutro Abs: 7.7 10*3/uL (ref 1.7–7.7)
Neutrophils Relative %: 84 %
Platelets: 280 10*3/uL (ref 150–400)
RBC: 2.64 MIL/uL — ABNORMAL LOW (ref 4.22–5.81)
RDW: 14 % (ref 11.5–15.5)
WBC: 9.1 10*3/uL (ref 4.0–10.5)
nRBC: 0 % (ref 0.0–0.2)

## 2023-03-30 LAB — RESP PANEL BY RT-PCR (RSV, FLU A&B, COVID)  RVPGX2
Influenza A by PCR: NEGATIVE
Influenza B by PCR: NEGATIVE
Resp Syncytial Virus by PCR: NEGATIVE
SARS Coronavirus 2 by RT PCR: NEGATIVE

## 2023-03-30 LAB — LACTIC ACID, PLASMA
Lactic Acid, Venous: 2.1 mmol/L (ref 0.5–1.9)
Lactic Acid, Venous: 0.9 mmol/L (ref 0.5–1.9)

## 2023-03-30 LAB — PROTIME-INR
INR: 1.2 (ref 0.8–1.2)
Prothrombin Time: 15.5 s — ABNORMAL HIGH (ref 11.4–15.2)

## 2023-03-30 LAB — PROCALCITONIN: Procalcitonin: 2.57 ng/mL

## 2023-03-30 LAB — TROPONIN I (HIGH SENSITIVITY): Troponin I (High Sensitivity): 12 ng/L (ref <18)

## 2023-03-30 MED ORDER — SODIUM CHLORIDE 0.9 % IV SOLN
2.0000 g | INTRAVENOUS | Status: DC
  Administered 2023-03-30 – 2023-03-31 (×2): 2 g via INTRAVENOUS
  Filled 2023-03-30 (×2): qty 20

## 2023-03-30 MED ORDER — ALPRAZOLAM 0.5 MG PO TABS
0.5000 mg | ORAL_TABLET | Freq: Every day | ORAL | Status: DC
  Administered 2023-03-30 – 2023-04-02 (×4): 0.5 mg via ORAL
  Filled 2023-03-30 (×4): qty 1

## 2023-03-30 MED ORDER — ONDANSETRON HCL 4 MG PO TABS: 4.0000 mg | ORAL_TABLET | Freq: Four times a day (QID) | ORAL | Status: DC | PRN

## 2023-03-30 MED ORDER — METOPROLOL TARTRATE 25 MG PO TABS
25.0000 mg | ORAL_TABLET | Freq: Two times a day (BID) | ORAL | Status: DC
  Administered 2023-03-31 – 2023-04-03 (×7): 25 mg via ORAL
  Filled 2023-03-30 (×7): qty 1

## 2023-03-30 MED ORDER — MOMETASONE FURO-FORMOTEROL FUM 200-5 MCG/ACT IN AERO: 2.0000 | INHALATION_SPRAY | Freq: Two times a day (BID) | RESPIRATORY_TRACT | Status: DC

## 2023-03-30 MED ORDER — MONTELUKAST SODIUM 10 MG PO TABS: 10.0000 mg | ORAL_TABLET | Freq: Every day | ORAL | Status: DC

## 2023-03-30 MED ORDER — IPRATROPIUM-ALBUTEROL 0.5-2.5 (3) MG/3ML IN SOLN
6.0000 mL | Freq: Once | RESPIRATORY_TRACT | Status: AC
  Administered 2023-03-30: 6 mL via RESPIRATORY_TRACT
  Filled 2023-03-30: qty 3

## 2023-03-30 MED ORDER — PANTOPRAZOLE SODIUM 40 MG PO TBEC
80.0000 mg | DELAYED_RELEASE_TABLET | Freq: Every day | ORAL | Status: DC
  Administered 2023-03-31 – 2023-04-03 (×4): 80 mg via ORAL
  Filled 2023-03-30 (×4): qty 2

## 2023-03-30 MED ORDER — ROFLUMILAST 500 MCG PO TABS
500.0000 ug | ORAL_TABLET | Freq: Every day | ORAL | Status: DC
  Administered 2023-03-31 – 2023-04-03 (×4): 500 ug via ORAL
  Filled 2023-03-30 (×4): qty 1

## 2023-03-30 MED ORDER — LACTATED RINGERS IV SOLN
150.0000 mL/h | INTRAVENOUS | Status: DC
Start: 2023-03-30 — End: 2023-03-31
  Administered 2023-03-30 (×2): 150 mL/h via INTRAVENOUS

## 2023-03-30 MED ORDER — ONDANSETRON HCL 4 MG/2ML IJ SOLN: 4.0000 mg | Freq: Four times a day (QID) | INTRAMUSCULAR | Status: DC | PRN

## 2023-03-30 MED ORDER — DEXTROSE 5 % IV SOLN: 500.0000 mg | INTRAVENOUS | Status: DC

## 2023-03-30 MED ORDER — GLYCOPYRROLATE 1 MG PO TABS
0.5000 mg | ORAL_TABLET | Freq: Three times a day (TID) | ORAL | Status: DC | PRN
  Administered 2023-03-31 – 2023-04-02 (×3): 0.5 mg via ORAL
  Filled 2023-03-30 (×4): qty 1

## 2023-03-30 MED ORDER — CARBIDOPA-LEVODOPA 25-100 MG PO TABS
2.0000 | ORAL_TABLET | Freq: Every day | ORAL | Status: DC
  Administered 2023-03-30 – 2023-04-03 (×18): 2 via ORAL
  Filled 2023-03-30 (×21): qty 2

## 2023-03-30 MED ORDER — HYDRALAZINE HCL 20 MG/ML IJ SOLN: 5.0000 mg | Freq: Four times a day (QID) | INTRAMUSCULAR | Status: DC | PRN

## 2023-03-30 MED ORDER — IPRATROPIUM-ALBUTEROL 0.5-2.5 (3) MG/3ML IN SOLN
3.0000 mL | Freq: Three times a day (TID) | RESPIRATORY_TRACT | Status: AC
  Administered 2023-03-30 – 2023-03-31 (×3): 3 mL via RESPIRATORY_TRACT
  Filled 2023-03-30 (×3): qty 3

## 2023-03-30 MED ORDER — CYANOCOBALAMIN ER 2000 MCG PO TBCR: 2000.0000 ug | EXTENDED_RELEASE_TABLET | Freq: Every day | ORAL | Status: DC

## 2023-03-30 MED ORDER — FERROUS GLUCONATE 324 (38 FE) MG PO TABS
324.0000 mg | ORAL_TABLET | Freq: Every day | ORAL | Status: DC
  Administered 2023-03-31: 324 mg via ORAL
  Filled 2023-03-30 (×2): qty 1

## 2023-03-30 MED ORDER — GUAIFENESIN ER 600 MG PO TB12
600.0000 mg | ORAL_TABLET | Freq: Two times a day (BID) | ORAL | Status: DC
  Administered 2023-03-30 – 2023-04-03 (×8): 600 mg via ORAL
  Filled 2023-03-30 (×8): qty 1

## 2023-03-30 MED ORDER — METHYLPREDNISOLONE SODIUM SUCC 40 MG IJ SOLR
40.0000 mg | Freq: Every day | INTRAMUSCULAR | Status: AC
  Administered 2023-03-31: 40 mg via INTRAVENOUS
  Filled 2023-03-30: qty 1

## 2023-03-30 MED ORDER — SENNOSIDES-DOCUSATE SODIUM 8.6-50 MG PO TABS: 1.0000 | ORAL_TABLET | Freq: Every evening | ORAL | Status: DC | PRN

## 2023-03-30 MED ORDER — ACETAMINOPHEN 325 MG PO TABS
650.0000 mg | ORAL_TABLET | Freq: Once | ORAL | Status: AC
  Administered 2023-03-30: 650 mg via ORAL
  Filled 2023-03-30: qty 2

## 2023-03-30 MED ORDER — METOPROLOL TARTRATE 25 MG PO TABS: 25.0000 mg | ORAL_TABLET | Freq: Two times a day (BID) | ORAL | Status: DC

## 2023-03-30 MED ORDER — QUETIAPINE FUMARATE 25 MG PO TABS
50.0000 mg | ORAL_TABLET | Freq: Every day | ORAL | Status: DC
  Administered 2023-03-30 – 2023-04-02 (×4): 50 mg via ORAL
  Filled 2023-03-30 (×4): qty 2

## 2023-03-30 MED ORDER — SODIUM CHLORIDE 0.9 % IV BOLUS (SEPSIS)
1000.0000 mL | Freq: Once | INTRAVENOUS | Status: AC
  Administered 2023-03-30: 1000 mL via INTRAVENOUS

## 2023-03-30 MED ORDER — DEXTROSE 5 % IV SOLN
500.0000 mg | INTRAVENOUS | Status: DC
  Administered 2023-03-30: 500 mg via INTRAVENOUS
  Filled 2023-03-30: qty 5

## 2023-03-30 MED ORDER — SODIUM CHLORIDE 0.9 % IV SOLN: 2.0000 g | INTRAVENOUS | Status: DC

## 2023-03-30 MED ORDER — ACETAMINOPHEN 650 MG RE SUPP: 650.0000 mg | Freq: Four times a day (QID) | RECTAL | Status: DC | PRN

## 2023-03-30 MED ORDER — ACETAMINOPHEN 325 MG PO TABS: 650.0000 mg | ORAL_TABLET | Freq: Four times a day (QID) | ORAL | Status: DC | PRN

## 2023-03-30 MED ORDER — METHYLPREDNISOLONE SODIUM SUCC 125 MG IJ SOLR
125.0000 mg | Freq: Once | INTRAMUSCULAR | Status: AC
  Administered 2023-03-30: 125 mg via INTRAVENOUS
  Filled 2023-03-30: qty 2

## 2023-03-30 NOTE — H&P (Signed)
History and Physical   SEHAJ COLIN NWG:[id number] DOB: [birth year] DOA: 03/30/2023  PCP: Barbette Reichmann, MD  Outpatient Specialists: [facility] Patient coming from: Home via EMS  I have personally briefly reviewed patient's old medical records in [facility] EMR.  Chief Concern: Shortness of breath  HPI: 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  Per ED documentation, patient's SpO2 was in the 60s upon EMS arrival.  EMS attempted CPAP, patient was unable to tolerate.  Patient is currently on nonrebreather nasal cannula at 10 L in the emergency department.  Vitals in the ED showed temperature of Tmax 102.1, respiration rate of 27, heart rate of 101, blood pressure 160/63, SpO2 of 90% on HFNC.  Serum sodium is 140, potassium 3.9, chloride 102, bicarb 29, BUN of 22, serum creatinine of 0.85, EGFR greater than 60, nonfasting blood glucose 143, WBC 9.1, hemoglobin 7.7, platelets of 280.  COVID/influenza A/influenza B/RSV PCR were negative.  UA was positive for trace leukocytes and negative for nitrites.  ED treatment: Acetaminophen 650 mg p.o., DuoNebs, Solu-Medrol 125 mg IV, azithromycin 500 mg IV and ceftriaxone 2 g IV 5-day course were ordered, sodium chloride 1 L bolus. ----------------------------- At bedside, patient is able to tell me his name, age, location, current calendar year.    He reports he has been short of breath for the past 2-3 nights.  He denies known sick contacts.  He reports cough that is productive of clear mucus.  He denies chest pain, nausea, vomiting, dysphagia, diarrhea, dysuria, hematuria, syncope.  He endorses associated decreased appetite  and difficulty sleeping along with generalized weakness over the last 2 to 3 days.  He endorses compliance with his baseline 2 L oxygen requirement.  He reports he feels better at bedside with the increased oxygen supplementation.  Social history: He lives at home with his wife, 45 years.  He denies tobacco, EtOH, recreational drug use.  ROS: Constitutional: no weight change, no fever ENT/Mouth: no sore throat, no rhinorrhea Eyes: no eye pain, no vision changes Cardiovascular: no chest pain, + dyspnea,  no edema, no palpitations Respiratory: + cough, + white sputum, no wheezing Gastrointestinal: no nausea, no vomiting, no diarrhea, no constipation Genitourinary: no urinary incontinence, no dysuria, no hematuria Musculoskeletal: no arthralgias, no myalgias Skin: no skin lesions, no pruritus, Neuro: + weakness, no loss of consciousness, no syncope Psych: no anxiety, no depression, + decrease appetite Heme/Lymph: no bruising, no bleeding  ED Course: Discussed with EDP, patient requiring hospitalization for chief concerns of acute on chronic hypoxic respiratory failure, suspect secondary to right lower lobe pneumonia.  Assessment/Plan  Principal Problem:   Acute on chronic hypoxic respiratory failure (HCC) Active Problems:   Paroxysmal atrial fibrillation (HCC)   Parkinson's disease   GERD   Peptic ulcer   Insomnia, persistent   COPD with acute exacerbation (HCC)   HTN (hypertension)   Pyuria   Assessment and Plan:  * Acute on chronic hypoxic respiratory failure (HCC) Check MRSA PCR, if positive can initiate vancomycin per pharmacy Blood cultures x 2 are in process, ordered in the ED Continue ceftriaxone 2 g, azithromycin 500 mg IV daily, to complete 5-day course Aspiration precaution Incentive spirometry, flutter valve every 2 hours while awake Respiratory  team consulted to teach patient appropriate technique for incentive spirometry and flutter valve use DuoNebs 3 times  daily, 3 doses ordered on admission Solu-Medrol 40 mg IV daily, 1 dose ordered for 11/18 Admit to PCU, inpatient  Paroxysmal atrial fibrillation (HCC) Home Eliquis not resumed on admission as patient has hemoglobin is 7.5, baseline is 8-9 No acute signs of bleeding at this time AM team to resume when the benefits outweigh the risk Recheck CBC in the a.m.  Parkinson's disease Home carbidopa-levodopa IR 25-100 mg tablet, 5 times daily resumed  Pyuria With trace leukocytes, present on admission Continue IV antibiotics treatment for community acquired pneumonia  HTN (hypertension) Home metoprolol tartrate 25 mg p.o. twice daily resumed Hydralazine 5 mg IV every 6 hours as needed for SBP greater than 165, 7 days ordered  COPD with acute exacerbation (HCC) DuoNebs 3 times daily, Solu-Medrol 40 mg IV daily Maintain SpO2 greater than 92% Continuous oximetry ordered  Insomnia, persistent Alprazolam 0.5 mg nightly resumed  Peptic ulcer Home equivalent PPI with Protonix 80 mg daily resumed  Chart reviewed.   DVT prophylaxis: TED hose; AM team to initiate pharmacologic DVT prophylaxis or resume home Eliquis when the benefits outweigh the risk  Code Status: DNR/DNI, MOST form reviewed Diet: N.p.o. except for sips with meds; SLP consulted Family Communication: No.  A phone call was offered, patient declined stating that his wife already knows he is being admitted to the hospital Disposition Plan: Pending clinical course; guarded prognosis Consults called: None at this time Admission status: PCU, inpatient  Past Medical History:  Diagnosis Date   Basal cell carcinoma 07/25/2015   L zygoma inf to lat canthus   Basal cell carcinoma 06/11/2018   L nasal ala   Basal cell carcinoma 10/04/2019   Left nose. Infiltrative pattern.   BPH (benign prostatic hyperplasia)    COPD (chronic obstructive pulmonary disease) (HCC)    Elevated liver enzymes    Emphysema of lung (HCC)    Gastric  ulcer    GERD (gastroesophageal reflux disease)    Hepatitis B    Hypertension    Parkinson's disease (HCC)    Prostate hypertrophy    Rotator cuff tear right   UTI (urinary tract infection)    Past Surgical History:  Procedure Laterality Date   CHOLECYSTECTOMY     COLONOSCOPY WITH PROPOFOL N/A 10/20/2015   Procedure: COLONOSCOPY WITH PROPOFOL;  Surgeon: Scot Jun, MD;  Location: [facility] ENDOSCOPY;  Service: Endoscopy;  Laterality: N/A;   COLONOSCOPY WITH PROPOFOL N/A 01/24/2021   Procedure: COLONOSCOPY WITH PROPOFOL;  Surgeon: Toledo, Boykin Nearing, MD;  Location: [facility] ENDOSCOPY;  Service: Gastroenterology;  Laterality: N/A;   ESOPHAGOGASTRODUODENOSCOPY (EGD) WITH PROPOFOL N/A 10/20/2015   Procedure: ESOPHAGOGASTRODUODENOSCOPY (EGD) WITH PROPOFOL;  Surgeon: Scot Jun, MD;  Location: [facility] ENDOSCOPY;  Service: Endoscopy;  Laterality: N/A;   GREEN LIGHT LASER TURP (TRANSURETHRAL RESECTION OF PROSTATE N/A 11/17/2018   Procedure: GREEN LIGHT LASER TURP (TRANSURETHRAL RESECTION OF PROSTATE;  Surgeon: Orson Ape, MD;  Location: [facility] ORS;  Service: Urology;  Laterality: N/A;   INGUINAL HERNIA REPAIR Right 02/18/2019   Procedure: HERNIA REPAIR INGUINAL ADULT;  Surgeon: Orson Ape, MD;  Location: [facility] ORS;  Service: Urology;  Laterality: Right;   INSERTION OF MESH Right 02/18/2019   Procedure: INSERTION OF MESH;  Surgeon: Orson Ape, MD;  Location: [facility] ORS;  Service: Urology;  Laterality: Right;   TONSILLECTOMY     TRANSURETHRAL MICROWAVE THERAPY     Social  History:  reports that he has quit smoking. His smoking use included cigarettes. He has a 62 pack-year smoking history. He has never used smokeless tobacco. He reports current alcohol use. He reports that he does not use drugs.  No Known Allergies Family History  Problem Relation Age of Onset   Colon cancer Mother    Hepatitis Son    Prostate cancer Neg Hx    Kidney cancer Neg Hx    Family history: Family history  reviewed and not pertinent.  Prior to Admission medications   Medication Sig Start Date End Date Taking? Authorizing Provider  albuterol (VENTOLIN HFA) 108 (90 Base) MCG/ACT inhaler Inhale 2 puffs into the lungs every 6 (six) hours as needed for wheezing or shortness of breath. 02/26/22   Sunnie Nielsen, DO  ALPRAZolam Prudy Feeler) 0.5 MG tablet Take 0.5 mg by mouth at bedtime.    [provider]  amiodarone (PACERONE) 200 MG tablet Take 0.5 tablets (100 mg total) by mouth daily. 07/29/22   Rana Snare, DO  apixaban (ELIQUIS) 5 MG TABS tablet Take 5 mg by mouth 2 (two) times daily.    [provider]  budesonide-formoterol (SYMBICORT) 160-4.5 MCG/ACT inhaler Inhale 2 puffs into the lungs 2 (two) times daily. 07/25/21   Ghimire, Werner Lean, MD  carbidopa-levodopa (SINEMET IR) 25-100 MG tablet Take 2 tablets by mouth 5 (five) times daily. Takes at 0800, 1200, 1600, 2000, and 00:00 (midnight)    [provider]  Cyanocobalamin ([employer] VITAMIN B-12) 2000 MCG TBCR Take 2,000 mcg by mouth daily.    [provider]  doxazosin (CARDURA) 4 MG tablet Take 4 mg by mouth daily. 04/20/22   [provider]  erythromycin ophthalmic ointment Place 1 Application into the left eye at bedtime.    [provider]  ferrous gluconate (FERGON) 324 MG tablet Take 324 mg by mouth daily with breakfast.    [provider]  glycopyrrolate (ROBINUL) 1 MG tablet Take 0.5 tablets (0.5 mg total) by mouth 3 (three) times daily as needed (excessive secretions). 07/29/22   Rana Snare, DO  guaiFENesin (MUCINEX) 600 MG 12 hr tablet Take 600 mg by mouth 2 (two) times daily.    [provider]  ipratropium-albuterol (DUONEB) 0.5-2.5 (3) MG/3ML SOLN Take 3 mLs by nebulization every 2 (two) hours as needed (ehzzeing / shortness of breath). 02/26/22   Sunnie Nielsen, DO  metoprolol tartrate (LOPRESSOR) 25 MG tablet Take 1 tablet (25 mg total) by mouth 2 (two) times  daily. 07/29/22   Rana Snare, DO  montelukast (SINGULAIR) 10 MG tablet Take 10 mg by mouth at bedtime.    [provider]  Morphine Sulfate (MORPHINE CONCENTRATE) 10 mg / 0.5 ml concentrated solution Take 5 mg by mouth every 2 (two) hours as needed for shortness of breath (pain). Take 0.43ml (5mg )    [provider]  omeprazole (PRILOSEC) 40 MG capsule Take 40 mg by mouth daily.  11/06/15   [provider]  ondansetron (ZOFRAN) 4 MG tablet Take 1 tablet (4 mg total) by mouth every 6 (six) hours as needed for nausea. 07/29/22   Rana Snare, DO  predniSONE (DELTASONE) 10 MG tablet Take 1 tablet (10 mg total) by mouth daily with breakfast. Restart 10mg  once daily on 3/21 after completed 40mg  burst 08/01/22   Rana Snare, DO  QUEtiapine (SEROQUEL) 50 MG tablet Take 50 mg by mouth at bedtime.    [provider]  roflumilast (DALIRESP) 500 MCG TABS tablet Take 500  mcg by mouth daily. 03/21/22   [provider]  sennosides-docusate sodium (SENOKOT-S) 8.6-50 MG tablet Take 1 tablet by mouth daily.    [provider]  sorbitol 70 % solution Take 30 mLs by mouth daily as needed (constipation).    [provider]   Physical Exam: Vitals:   03/30/23 1315 03/30/23 1330 03/30/23 1345 03/30/23 1530  BP:  131/60  116/60  Pulse: (!) 101 93 81 79  Resp: (!) 23 (!) 31 (!) 29 20  Temp:      TempSrc:      SpO2: 97% 97% 100% 100%  Weight:      Height:       Constitutional: appears age-appropriate,sleepy and arousable on verbal stimuli, acutely ill Eyes: PERRL, lids and conjunctivae normal ENMT: Mucous membranes are moist. Posterior pharynx clear of any exudate or lesions. Age-appropriate dentition. Hearing appropriate Neck: normal, supple, no masses, no thyromegaly Respiratory: Generalized decreased lung sound, worse in the right lower lobe, + mild diffuse end expiratory wheezing, no crackles.  Increased respiratory effort. No accessory muscle  use.  High flow nasal cannula in place at 10 L Cardiovascular: Regular rate and rhythm, no murmurs / rubs / gallops. No extremity edema. 2+ pedal pulses. No carotid bruits.  Abdomen: no tenderness, no masses palpated, no hepatosplenomegaly. Bowel sounds positive.  Musculoskeletal: no clubbing / cyanosis. No joint deformity upper and lower extremities. Good ROM, no contractures, no atrophy. Normal muscle tone.  Skin: no rashes, lesions, ulcers. No induration Neurologic: Sensation intact. Strength 5/5 in all 4.  Psychiatric: Normal judgment and insight. Alert and oriented x 3.  Depressed mood.   EKG: independently reviewed, showing sinus tachycardia with rate of 104, QTc 470  Chest x-ray on Admission: I personally reviewed and I agree with radiologist reading as below.  DG Chest Port 1 View  Result Date: 03/30/2023 CLINICAL DATA:  Shortness of breath.  Sepsis. EXAM: PORTABLE CHEST 1 VIEW COMPARISON:  One-view chest x-ray 07/27/2022 FINDINGS: Heart size is normal. Atherosclerotic calcifications are present at the aortic arch. A right lower lobe pneumonia is present. The left lung is clear. No significant effusions or pneumothorax are present. IMPRESSION: Right lower lobe pneumonia. These results were called by telephone at the time of interpretation on 03/30/2023 at 11:42 am to provider NEHA RAY , who verbally acknowledged these results. Electronically Signed   By: Marin Roberts M.D.   On: 03/30/2023 11:45    Labs on Admission: I have personally reviewed following labs  CBC: Recent Labs  Lab 03/30/23 1134  WBC 9.1  NEUTROABS 7.7  HGB 7.7*  HCT 25.7*  MCV 97.3  PLT 280   Basic Metabolic Panel: Recent Labs  Lab 03/30/23 1134  NA 140  K 3.9  CL 102  CO2 29  GLUCOSE 143*  BUN 22  CREATININE 0.85  CALCIUM 8.5*   GFR: Estimated Creatinine Clearance: 65.8 mL/min (by C-G formula based on SCr of 0.85 mg/dL).  Liver Function Tests: Recent Labs  Lab 03/30/23 1134  AST 21   ALT <5  ALKPHOS 71  BILITOT 0.5  PROT 7.0  ALBUMIN 3.7   Coagulation Profile: Recent Labs  Lab 03/30/23 1201  INR 1.2   Urine analysis:    Component Value Date/Time   COLORURINE STRAW (A) 03/30/2023 1201   APPEARANCEUR CLEAR (A) 03/30/2023 1201   APPEARANCEUR Clear 10/26/2012 1721   LABSPEC 1.011 03/30/2023 1201   LABSPEC 1.021 10/26/2012 1721   PHURINE 6.0 03/30/2023 1201   GLUCOSEU NEGATIVE  03/30/2023 1201   GLUCOSEU Negative 10/26/2012 1721   HGBUR NEGATIVE 03/30/2023 1201   HGBUR negative 01/01/2008 0000   BILIRUBINUR NEGATIVE 03/30/2023 1201   BILIRUBINUR Negative 10/26/2012 1721   KETONESUR NEGATIVE 03/30/2023 1201   PROTEINUR NEGATIVE 03/30/2023 1201   UROBILINOGEN 1.0 08/29/2010 0041   NITRITE NEGATIVE 03/30/2023 1201   LEUKOCYTESUR TRACE (A) 03/30/2023 1201   LEUKOCYTESUR 3+ 10/26/2012 1721   CRITICAL CARE Performed by: Dr. Sedalia Muta  Total critical care time: 32 minutes  Critical care time was exclusive of separately billable procedures and treating other patients.  Critical care was necessary to treat or prevent imminent or life-threatening deterioration.  Critical care was time spent personally by me on the following activities: development of treatment plan with patient and/or surrogate as well as nursing, discussions with consultants, evaluation of patient's response to treatment, examination of patient, obtaining history from patient or surrogate, ordering and performing treatments and interventions, ordering and review of laboratory studies, ordering and review of radiographic studies, pulse oximetry and re-evaluation of patient's condition.  This document was prepared using Dragon Voice Recognition software and may include unintentional dictation errors.  Dr. Sedalia Muta [facility]  If 7PM-7AM, please contact overnight-coverage provider If 7AM-7PM, please contact day attending provider [email or web address]  03/30/2023, 4:02 PM

## 2023-03-30 NOTE — Hospital Course (Addendum)
Mr. Andras Farrugia is a 82 year old male with history of paroxysmal atrial fibrillation on Eliquis, COPD, history of recurrent pneumonia, heart failure preserved ejection fraction, 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  Per ED documentation, patient's SpO2 was in the 60s upon EMS arrival.  Patient was placed on nonrebreather.  She also had temperature of 102.1. Chest x-ray showed a right lower lobe pneumonia.  Patient was placed on antibiotics for aspiration pneumonia.  Discussed with patient and son, this is the fifth time patient had aspiration pneumonia this year.  11/20.  Patient still has diffuse wheezing throughout.  Hemoglobin dipped down to 6.9.  Patient will be transfused a another unit of packed red blood cells today. 11/21.  Patient had a severe headache this morning patient was given IV magnesium and IV Toradol and patient was much better in the afternoon and wanted to go home.  Patient's wheezing has improved.  Patient responded to blood transfusion and hemoglobin up to 8.6.

## 2023-03-30 NOTE — Assessment & Plan Note (Signed)
Home equivalent PPI with Protonix 80 mg daily resumed

## 2023-03-30 NOTE — Consult Note (Signed)
CODE SEPSIS - PHARMACY COMMUNICATION  **Broad Spectrum Antibiotics should be administered within 1 hour of Sepsis diagnosis**  Time Code Sepsis Called/Page Received: 1153  Antibiotics Ordered: ceftriaxone and Azithromycin   Time of 1st antibiotic administration: 1212  Additional action taken by pharmacy: none  If necessary, Name of Provider/Nurse Contacted: n/a    Sreekar Broyhill Rodriguez-Guzman PharmD, BCPS 03/30/2023 11:53 AM

## 2023-03-30 NOTE — Assessment & Plan Note (Signed)
Continue metoprolol. 

## 2023-03-30 NOTE — Assessment & Plan Note (Signed)
DuoNebs 3 times daily, Solu-Medrol 40 mg IV daily Maintain SpO2 greater than 92% Continuous oximetry ordered

## 2023-03-30 NOTE — Assessment & Plan Note (Signed)
Alprazolam 0.5 mg nightly resumed

## 2023-03-30 NOTE — ED Triage Notes (Signed)
pt in via [facility] c/o SOB. hx of CHF & COPD. Rales everywhere per EMS. Sats in the 60's upon EMS arrival. On non-rebreather. Placed on cpap but unable to tolerate. EMS unsure if dementia or Altered. Has not spoken with EMS.   X1 Nitro  EMS Vital Signs: Hr-110 O2- 92% on Non-rebreather Bp- 200/100

## 2023-03-30 NOTE — Assessment & Plan Note (Deleted)
With trace leukocytes, present on admission Continue IV antibiotics treatment for community acquired pneumonia

## 2023-03-30 NOTE — Assessment & Plan Note (Signed)
Home Eliquis not resumed on admission as patient has hemoglobin is 7.5, baseline is 8-9 No acute signs of bleeding at this time AM team to resume when the benefits outweigh the risk Recheck CBC in the a.m.

## 2023-03-30 NOTE — ED Provider Notes (Signed)
[facility] Provider Note    Event Date/Time   First MD Initiated Contact with Patient 03/30/23 1123     (approximate)   History   Shortness of Breath   HPI  Patrick Montoya is a 82 year old male with history of COPD, HTN presenting to ER for evaluation of shortness of breath.  Reports that earlier today he had onset of shortness of breath.  With EMS, patient was found to have sats in the 60s.  He is placed on a nonrebreather.  Patient was unable to tolerate CPAP.  He was given nitro x 1.  O2 sat in the 90s on nonrebreather.  Initially hypertensive for EMS at 200/100.  Patient arousable here, but somnolent, limited history available.       Physical Exam   Triage Vital Signs: ED Triage Vitals  Encounter Vitals Group     BP 03/30/23 1127 (!) 160/83     Systolic BP Percentile --      Diastolic BP Percentile --      Pulse Rate 03/30/23 1127 (!) 103     Resp 03/30/23 1127 (!) 37     Temp 03/30/23 1127 (!) 102.1 F (38.9 C)     Temp Source 03/30/23 1127 Oral     SpO2 03/30/23 1127 100 %     Weight 03/30/23 1122 166 lb 10.7 oz (75.6 kg)     Height 03/30/23 1122 5\' 9"  (1.753 m)     Head Circumference --      Peak Flow --      Pain Score --      Pain Loc --      Pain Education --      Exclude from Growth Chart --     Most recent vital signs: Vitals:   03/30/23 1330 03/30/23 1345  BP: 131/60   Pulse: 93 81  Resp: (!) 31 (!) 29  Temp:    SpO2: 97% 100%     General: Somnolent but arousable CV:  Tachycardic, good peripheral perfusion  Resp:  Shallow respirations with expiratory wheezing and tachypnea Abd:  Soft, nondistended Neuro:  Symmetric facial movement, fluid speech   ED Results / Procedures / Treatments   Labs (all labs ordered are listed, but only abnormal results are displayed) Labs Reviewed  COMPREHENSIVE METABOLIC PANEL - Abnormal; Notable for the following components:      Result Value   Glucose, Bld 143 (*)    Calcium 8.5  (*)    All other components within normal limits  LACTIC ACID, PLASMA - Abnormal; Notable for the following components:   Lactic Acid, Venous 2.1 (*)    All other components within normal limits  CBC WITH DIFFERENTIAL/PLATELET - Abnormal; Notable for the following components:   RBC 2.64 (*)    Hemoglobin 7.7 (*)    HCT 25.7 (*)    All other components within normal limits  URINALYSIS, W/ REFLEX TO CULTURE (INFECTION SUSPECTED) - Abnormal; Notable for the following components:   Color, Urine STRAW (*)    APPearance CLEAR (*)    Leukocytes,Ua TRACE (*)    All other components within normal limits  BLOOD GAS, VENOUS - Abnormal; Notable for the following components:   Bicarbonate 32.7 (*)    Acid-Base Excess 6.2 (*)    All other components within normal limits  PROTIME-INR - Abnormal; Notable for the following components:   Prothrombin Time 15.5 (*)    All other components within normal limits  RESP PANEL BY  RT-PCR (RSV, FLU A&B, COVID)  RVPGX2  CULTURE, BLOOD (ROUTINE X 2)  CULTURE, BLOOD (ROUTINE X 2)  MRSA NEXT GEN BY PCR, NASAL  LACTIC ACID, PLASMA  PROCALCITONIN  TROPONIN I (HIGH SENSITIVITY)     EKG EKG independently reviewed interpreted by myself (ER attending) demonstrates:  EKG demonstrates suspected sinus rhythm though interpreted by computer as A-fib a rate of 104, QRS 84, QTc 470, no acute ST changes Initial EKG with significant artifact, interpreted by computer as acute MI but suspect secondary to baseline wander  RADIOLOGY Imaging independently reviewed and interpreted by myself demonstrates:  Chest x-Georjean Toya with large right lower lobe consolidation concerning for pneumonia  PROCEDURES:  Critical Care performed: Yes, see critical care procedure note(s)  CRITICAL CARE Performed by: Trinna Post   Total critical care time: 35 minutes  Critical care time was exclusive of separately billable procedures and treating other patients.  Critical care was necessary to  treat or prevent imminent or life-threatening deterioration.  Critical care was time spent personally by me on the following activities: development of treatment plan with patient and/or surrogate as well as nursing, discussions with consultants, evaluation of patient's response to treatment, examination of patient, obtaining history from patient or surrogate, ordering and performing treatments and interventions, ordering and review of laboratory studies, ordering and review of radiographic studies, pulse oximetry and re-evaluation of patient's condition.   Procedures   MEDICATIONS ORDERED IN ED: Medications  cefTRIAXone (ROCEPHIN) 2 g in sodium chloride 0.9 % 100 mL IVPB (0 g Intravenous Stopped 03/30/23 1244)  azithromycin (ZITHROMAX) 500 mg in dextrose 5 % 250 mL IVPB (0 mg Intravenous Stopped 03/30/23 1351)  ALPRAZolam (XANAX) tablet 0.5 mg (has no administration in time range)  QUEtiapine (SEROQUEL) tablet 50 mg (has no administration in time range)  lactated ringers infusion (150 mL/hr Intravenous New Bag/Given 03/30/23 1539)  acetaminophen (TYLENOL) tablet 650 mg (has no administration in time range)    Or  acetaminophen (TYLENOL) suppository 650 mg (has no administration in time range)  ondansetron (ZOFRAN) tablet 4 mg (has no administration in time range)    Or  ondansetron (ZOFRAN) injection 4 mg (has no administration in time range)  sodium chloride 0.9 % bolus 1,000 mL (1,000 mLs Intravenous New Bag/Given 03/30/23 1504)  senna-docusate (Senokot-S) tablet 1 tablet (has no administration in time range)  sodium chloride 0.9 % bolus 1,000 mL (0 mLs Intravenous Stopped 03/30/23 1351)  acetaminophen (TYLENOL) tablet 650 mg (650 mg Oral Given 03/30/23 1259)  ipratropium-albuterol (DUONEB) 0.5-2.5 (3) MG/3ML nebulizer solution 6 mL (6 mLs Nebulization Given 03/30/23 1306)  methylPREDNISolone sodium succinate (SOLU-MEDROL) 125 mg/2 mL injection 125 mg (125 mg Intravenous Given 03/30/23  1259)     IMPRESSION / MDM / ASSESSMENT AND PLAN / ED COURSE  I reviewed the triage vital signs and the nursing notes.  Differential diagnosis includes, but is not limited to, pneumonia, COPD exacerbation, CHF exacerbation, viral illness  Patient's presentation is most consistent with acute presentation with potential threat to life or bodily function.  82 year old male presenting to the emergency department for evaluation of shortness of breath.  Tachycardic, tachypneic, and febrile on presentation.  Normal white blood cell count, but x-Rashan Patient with right sided pneumonia.  Does have anemia with hemoglobin of 7.7, no reported acute bleeding sources.  Quite tachypneic on presentation, but VBG with normal pH and pCO2.  Normal lactate.  Patient empirically treated with ceftriaxone and azithromycin and ordered for 30 cc/kg of fluids.  He was also  given DuoNebs as well as steroids for suspected component of COPD exacerbation.  Of note, patient is on hospice, but does tell me that he would like to be admitted for further treatment.  Does arrive with a DNR form.  Will reach out to hospitalist team.      FINAL CLINICAL IMPRESSION(S) / ED DIAGNOSES   Final diagnoses:  Community acquired pneumonia of right lower lobe of lung  COPD exacerbation (HCC)     Rx / DC Orders   ED Discharge Orders     None        Note:  This document was prepared using Dragon voice recognition software and may include unintentional dictation errors.   Trinna Post, MD 03/30/23 [phone]

## 2023-03-30 NOTE — Assessment & Plan Note (Signed)
Home carbidopa-levodopa IR 25-100 mg tablet, 5 times daily resumed

## 2023-03-30 NOTE — Progress Notes (Signed)
[facility] Hospitalized Hospice Patient  Patrick Montoya is a current hospice patient followed at home for terminal diagnosis of COPD.  Hospice was notified by patient's daughter in law, Patrick Montoya, that patient was in route to the hospital due to shortness of breath.  Our agency was not notified prior to transport to the hospital for intervention.  Patient is being admitted to [facility] with diagnosis respiratory failure with pneumonia.  Per Dr. Elliot Gurney, this is a related hospice admission.  Visited Patrick Montoya at the bedside in the ED.  Patient with planned admission for above.  Endorses not feeling well for past couple of days with increased SOB.  Patient is currently on non-rebreather.  No family currently present at bedside.  Patient is appropriate for GIP LOC for symptom management of shortness of breath requiring continuous care and treatment with IV antibiotics.  Patient requires frequent monitoring and assessment of ordered interventions.  Vital Signs:  T 102.1 (oral), BP 160/83, P 103, R 37, Oxi 100% on 100% non-rebreather  Abnormal labs:   Blood Gas- Acid Base excess 6.2, Bicarb 32.7 glucose 143, calcium 8.5, lactic acid 2.1, RBC 2.64, Hgb 7.7, Hct 25.7, PT 15.5 UA- trace leukocytes  Diagnostics:   CLINICAL DATA:  Shortness of breath.  Sepsis. EXAM:  PORTABLE CHEST 1 VIEW   COMPARISON:  One-view chest x-ray 07/27/2022   FINDINGS: Heart size is normal. Atherosclerotic calcifications are present at the aortic arch. A right lower lobe pneumonia is present. The left lung is clear. No significant effusions or pneumothorax are present.   IMPRESSION: Right lower lobe pneumonia.   These results were called by telephone at the time of interpretation on 03/30/2023 at 11:42 am to provider NEHA RAY , who verbally acknowledged these results.    Electronically Signed   By: Marin Roberts M.D.   On: 03/30/2023 11:45  IV/PRN Meds: Solumedrol 125mg  IV @  1259 Zithromax 500mg  @ 1244 Rocephin 2g IV @ 1212  Problem list:     Acute on chronic hypoxic respiratory failure (HCC) Check MRSA PCR, if positive can initiate vancomycin per pharmacy Blood cultures x 2 are in process, ordered in the ED Continue ceftriaxone 2 g, azithromycin 500 mg IV daily, to complete 5-day course Aspiration precaution Incentive spirometry, flutter valve every 2 hours while awake Respiratory team consulted to teach patient appropriate technique for incentive spirometry and flutter valve use DuoNebs 3 times daily, 3 doses ordered on admission Solu-Medrol 40 mg IV daily, 1 dose ordered for 11/18 Admit to PCU, inpatient   2.  Paroxysmal atrial fibrillation (HCC) Home Eliquis not resumed on admission as patient has hemoglobin is 7.5, baseline is 8-9 No acute signs of bleeding at this time AM team to resume when the benefits outweigh the risk Recheck CBC in the a.m.   3.  Parkinson's disease Home carbidopa-levodopa IR 25-100 mg tablet, 5 times daily resumed   4.  Pyuria With trace leukocytes, present on admission Continue IV antibiotics treatment for community acquired pneumonia   5.  HTN (hypertension) Home metoprolol tartrate 25 mg p.o. twice daily resumed Hydralazine 5 mg IV every 6 hours as needed for SBP greater than 165, 7 days ordered   6.  COPD with acute exacerbation (HCC) DuoNebs 3 times daily, Solu-Medrol 40 mg IV daily Maintain SpO2 greater than 92% Continuous oximetry ordered   7.  Insomnia, persistent Alprazolam 0.5 mg nightly resumed   8.  Peptic ulcer Home equivalent PPI with Protonix 80 mg daily resumed  Discharge Planning:  Ongoing-  To be admitted from ED Family contact:  No family present. IDT:  Updated   Goals of Care- DNR Medication list given to ED secretary to place on shadow chart.  Please call with any hospice related questions or concerns  Norris Cross, RN Nurse Liaison [phone]

## 2023-03-30 NOTE — Assessment & Plan Note (Addendum)
Patient currently on 3 L.  Initially required high flow nasal cannula 10 L on presentation.

## 2023-03-30 NOTE — Sepsis Progress Note (Signed)
Elink following code sepsis °

## 2023-03-31 ENCOUNTER — Other Ambulatory Visit: Payer: Self-pay

## 2023-03-31 ENCOUNTER — Encounter: Payer: Self-pay | Admitting: Internal Medicine

## 2023-03-31 DIAGNOSIS — D500 Iron deficiency anemia secondary to blood loss (chronic): Secondary | ICD-10-CM

## 2023-03-31 DIAGNOSIS — J9621 Acute and chronic respiratory failure with hypoxia: Secondary | ICD-10-CM | POA: Diagnosis not present

## 2023-03-31 DIAGNOSIS — D509 Iron deficiency anemia, unspecified: Secondary | ICD-10-CM | POA: Insufficient documentation

## 2023-03-31 DIAGNOSIS — I480 Paroxysmal atrial fibrillation: Secondary | ICD-10-CM | POA: Diagnosis not present

## 2023-03-31 DIAGNOSIS — J690 Pneumonitis due to inhalation of food and vomit: Principal | ICD-10-CM

## 2023-03-31 LAB — BASIC METABOLIC PANEL
Anion gap: 10 (ref 5–15)
BUN: 14 mg/dL (ref 8–23)
CO2: 23 mmol/L (ref 22–32)
Calcium: 7.5 mg/dL — ABNORMAL LOW (ref 8.9–10.3)
Chloride: 107 mmol/L (ref 98–111)
Creatinine, Ser: 0.47 mg/dL — ABNORMAL LOW (ref 0.61–1.24)
GFR, Estimated: >60 mL/min (ref >60)
Glucose, Bld: 97 mg/dL (ref 70–99)
Potassium: 4 mmol/L (ref 3.5–5.1)
Sodium: 140 mmol/L (ref 135–145)

## 2023-03-31 LAB — CBC
HCT: 21.4 % — ABNORMAL LOW (ref 39.0–52.0)
Hemoglobin: 6.4 g/dL — ABNORMAL LOW (ref 13.0–17.0)
MCH: 29.1 pg (ref 26.0–34.0)
MCHC: 29.9 g/dL — ABNORMAL LOW (ref 30.0–36.0)
MCV: 97.3 fL (ref 80.0–100.0)
Platelets: 179 10*3/uL (ref 150–400)
RBC: 2.2 MIL/uL — ABNORMAL LOW (ref 4.22–5.81)
RDW: 13.9 % (ref 11.5–15.5)
WBC: 16.1 10*3/uL — ABNORMAL HIGH (ref 4.0–10.5)
nRBC: 0 % (ref 0.0–0.2)

## 2023-03-31 LAB — ABO/RH: ABO/RH(D): A POS

## 2023-03-31 LAB — PROCALCITONIN: Procalcitonin: 2.52 ng/mL

## 2023-03-31 LAB — BLOOD GAS, VENOUS
Acid-Base Excess: 6.2 mmol/L — ABNORMAL HIGH (ref 0.0–2.0)
O2 Saturation: 27.1 %
Patient temperature: 37
Patient temperature: 37
pCO2, Ven: 54 mmHg (ref 44–60)
pH, Ven: 7.39 (ref 7.25–7.43)
pO2, Ven: 32.7 mmol/L — ABNORMAL HIGH (ref 32–45)

## 2023-03-31 LAB — PREPARE RBC (CROSSMATCH)

## 2023-03-31 LAB — MRSA NEXT GEN BY PCR, NASAL: MRSA by PCR Next Gen: DETECTED — AB

## 2023-03-31 LAB — BRAIN NATRIURETIC PEPTIDE: B Natriuretic Peptide: 275.1 pg/mL — ABNORMAL HIGH (ref 0.0–100.0)

## 2023-03-31 MED ORDER — ENSURE ENLIVE PO LIQD
237.0000 mL | Freq: Two times a day (BID) | ORAL | Status: DC
  Administered 2023-04-01 – 2023-04-03 (×6): 237 mL via ORAL

## 2023-03-31 MED ORDER — FUROSEMIDE 10 MG/ML IJ SOLN
40.0000 mg | Freq: Once | INTRAMUSCULAR | Status: AC
  Administered 2023-03-31: 40 mg via INTRAVENOUS
  Filled 2023-03-31: qty 4

## 2023-03-31 MED ORDER — PREDNISONE 20 MG PO TABS
40.0000 mg | ORAL_TABLET | Freq: Every day | ORAL | Status: DC
  Administered 2023-04-01 – 2023-04-03 (×3): 40 mg via ORAL
  Filled 2023-03-31 (×3): qty 2

## 2023-03-31 MED ORDER — IRON SUCROSE 300 MG IVPB - SIMPLE MED
300.0000 mg | Freq: Once | Status: AC
  Administered 2023-03-31: 300 mg via INTRAVENOUS
  Filled 2023-03-31: qty 300

## 2023-03-31 MED ORDER — SODIUM CHLORIDE 0.9 % IV SOLN
3.0000 g | Freq: Four times a day (QID) | INTRAVENOUS | Status: DC
  Administered 2023-03-31 – 2023-04-03 (×13): 3 g via INTRAVENOUS
  Filled 2023-03-31 (×14): qty 8

## 2023-03-31 MED ORDER — IPRATROPIUM-ALBUTEROL 0.5-2.5 (3) MG/3ML IN SOLN
3.0000 mL | Freq: Three times a day (TID) | RESPIRATORY_TRACT | Status: DC
  Administered 2023-03-31 – 2023-04-03 (×9): 3 mL via RESPIRATORY_TRACT
  Filled 2023-03-31 (×9): qty 3

## 2023-03-31 MED ORDER — SODIUM CHLORIDE 0.9% IV SOLUTION
Freq: Once | INTRAVENOUS | Status: AC
  Filled 2023-03-31: qty 250

## 2023-03-31 MED ORDER — OXYMETAZOLINE HCL 0.05 % NA SOLN
1.0000 | Freq: Two times a day (BID) | NASAL | Status: DC
  Administered 2023-03-31 – 2023-04-02 (×5): 1 via NASAL
  Filled 2023-03-31: qty 15

## 2023-03-31 NOTE — ED Notes (Signed)
Patient's POA Orvan Falconer called for an update, and was given information on the patient.

## 2023-03-31 NOTE — Progress Notes (Signed)
PIV consult for second site: per previous consult/ LUE with infiltration. Several attempts made by ED RNs. Limited venous access options. Recommend staggering infusions as possible. Discussed with Andrill, RN who voiced understanding.

## 2023-03-31 NOTE — Progress Notes (Signed)
Progress Note   Patient: Patrick Montoya KGM:[id number] DOB: 1940/06/16 DOA: 03/30/2023     1 DOS: the patient was seen and examined on 03/31/2023   Brief hospital course: 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  Per ED documentation, patient's SpO2 was in the 60s upon EMS arrival.  Patient was placed on nonrebreather.  She also had temperature of 102.1. Chest x-ray showed a right lower lobe pneumonia.  Patient was placed on antibiotics for aspiration pneumonia.  Discussed with patient and son, this is the fifth time patient had aspiration pneumonia this year.   Principal Problem:   Acute on chronic hypoxic respiratory failure (HCC) Active Problems:   Aspiration pneumonia of right lower lobe (HCC)   Paroxysmal atrial fibrillation (HCC)   Parkinson's disease   GERD   Peptic ulcer   Insomnia, persistent   COPD with acute exacerbation (HCC)   HTN (hypertension)   Pyuria   Iron deficiency anemia   Aspiration pneumonia (HCC)   Assessment and Plan: * Acute on chronic hypoxic respiratory failure (HCC) Right lower lobe aspiration pneumonia, recurrent. COPD exacerbation. Patient had a severe respite distress at the time of admission, requiring nonbreather.  Patient was given antibiotics, steroids, oxygen is better, currently on 10 L oxygen with baseline oxygen use up to 1.5 L. I reviewed patient chest x-ray image, patient clearly has a right lower lobe aspiration pneumonia.  Discussed with patient and son, this is the 5th patient had aspiration pneumonia in this year.  Condition is terminal, patient is still appropriate for hospice.  Patient is admitted to hospital today,  will discharge back to hospice when condition is more stable. Obtain speech therapy evaluation.  N.p.o. until evaluation is completed. Also received IV steroids, will start oral prednisone tomorrow.  Paroxysmal atrial fibrillation (HCC) Hold off anticoagulation due to acute anemia.  Acute on chronic iron deficient anemia. Patient hemoglobin has dropped down to 6.4, discussed with patient's son, not sure patient had any GI bleed.  I will discontinue Eliquis this time.  Patient iron level is very low in the recent blood work, will give a dose of IV iron, additionally, will transfuse 1 unit PRBC followed with the 40 mg IV Lasix. Due to poor prognosis, will not perform any additional workup for anemia.   Parkinson's disease Home carbidopa-levodopa IR 25-100 mg tablet, 5 times daily resumed  HTN (hypertension) Continue metoprolol.  Insomnia, persistent Alprazolam 0.5 mg nightly resumed  Peptic ulcer Home equivalent PPI with Protonix 80 mg daily resumed       Subjective:  Patient currently still on 10 L oxygen, short of breath with exertion.  Cough, nonproductive.   Physical Exam: Vitals:   03/31/23 0930 03/31/23 1000 03/31/23 1135 03/31/23 1138  BP: 129/81 133/75 125/66   Pulse: 74 67 67 71  Resp: (!) 27 (!) 28 (!) 28 19  Temp:      TempSrc:      SpO2: 100% 100% 100% 99%  Weight:      Height:       General exam: Appears calm and comfortable  Respiratory system: Crackles in the right base. Respiratory effort normal. Cardiovascular system: S1 & S2 heard, RRR. No JVD, murmurs, rubs, gallops or clicks. No  pedal edema. Gastrointestinal system: Abdomen is nondistended, soft and nontender. No organomegaly or masses felt. Normal bowel sounds heard. Central nervous system: Alert and oriented x2. No focal neurological deficits. Extremities: Symmetric 5 x 5 power. Skin: No rashes, lesions or ulcers Psychiatry: Mood & affect appropriate.    Data Reviewed:  Reviewed chest x-ray  images, report, lab results.  Family Communication: Son updated over the phone.  Disposition: Status is: Inpatient Remains inpatient appropriate because: Severity of disease, IV treatment.     Time spent: 55 minutes  Author: Marrion Coy, MD 03/31/2023 11:57 AM  For on call review [email or web address].

## 2023-03-31 NOTE — Evaluation (Addendum)
Clinical/Bedside Swallow Evaluation Patient Details  Name: Patrick Montoya MRN: [id number] Date of Birth: 1940-09-04  Today's Date: 03/31/2023 Time: SLP Start Time (ACUTE ONLY): 1157 SLP Stop Time (ACUTE ONLY): 1245 SLP Time Calculation (min) (ACUTE ONLY): 48 min  Past Medical History:  Past Medical History:  Diagnosis Date   Basal cell carcinoma 07/25/2015   L zygoma inf to lat canthus   Basal cell carcinoma 06/11/2018   L nasal ala   Basal cell carcinoma 10/04/2019   Left nose. Infiltrative pattern.   BPH (benign prostatic hyperplasia)    COPD (chronic obstructive pulmonary disease) (HCC)    Elevated liver enzymes    Emphysema of lung (HCC)    Gastric ulcer    GERD (gastroesophageal reflux disease)    Hepatitis B    Hypertension    Parkinson's disease (HCC)    Prostate hypertrophy    Rotator cuff tear right   UTI (urinary tract infection)    Past Surgical History:  Past Surgical History:  Procedure Laterality Date   CHOLECYSTECTOMY     COLONOSCOPY WITH PROPOFOL N/A 10/20/2015   Procedure: COLONOSCOPY WITH PROPOFOL;  Surgeon: Scot Jun, MD;  Location: [facility] ENDOSCOPY;  Service: Endoscopy;  Laterality: N/A;   COLONOSCOPY WITH PROPOFOL N/A 01/24/2021   Procedure: COLONOSCOPY WITH PROPOFOL;  Surgeon: Toledo, Boykin Nearing, MD;  Location: [facility] ENDOSCOPY;  Service: Gastroenterology;  Laterality: N/A;   ESOPHAGOGASTRODUODENOSCOPY (EGD) WITH PROPOFOL N/A 10/20/2015   Procedure: ESOPHAGOGASTRODUODENOSCOPY (EGD) WITH PROPOFOL;  Surgeon: Scot Jun, MD;  Location: [facility] ENDOSCOPY;  Service: Endoscopy;  Laterality: N/A;   GREEN LIGHT LASER TURP (TRANSURETHRAL RESECTION OF PROSTATE N/A 11/17/2018   Procedure: GREEN LIGHT LASER TURP (TRANSURETHRAL RESECTION OF PROSTATE;  Surgeon: Orson Ape, MD;  Location: [facility] ORS;  Service: Urology;  Laterality: N/A;   INGUINAL HERNIA REPAIR Right 02/18/2019   Procedure: HERNIA REPAIR INGUINAL ADULT;  Surgeon: Orson Ape, MD;  Location:  [facility] ORS;  Service: Urology;  Laterality: Right;   INSERTION OF MESH Right 02/18/2019   Procedure: INSERTION OF MESH;  Surgeon: Orson Ape, MD;  Location: [facility] ORS;  Service: Urology;  Laterality: Right;   TONSILLECTOMY     TRANSURETHRAL MICROWAVE THERAPY     HPI:  Pt is a 82 year old male with history of Chronic, Dx'd Pharyngeal Phase Dysphagia per MBSS 04/2022 w/ SILENT ASPIRATION of Liquids; paroxysmal atrial fibrillation on Eliquis, COPD, history of recurrent pneumonia, heart failure preserved ejection fraction, 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     Per ED documentation, patient's SpO2 was in the 60s upon EMS arrival.  EMS attempted CPAP, patient was unable to tolerate.  Patient is currently on nonrebreather nasal cannula at 10 L in the emergency department.     Vitals in the ED showed temperature of Tmax 102.1, respiration rate of 27, heart rate of 101, blood pressure 160/63, SpO2 of 90% on HFNC.   CXR: A right lower lobe pneumonia is present. The left  lung is clear. No significant effusions or pneumothorax are present.    Assessment / Plan / Recommendation  Clinical Impression   Pt seen today for BSE. Pt awake, verbal. Min SOB w/ exertion of talking. On [place] O2 support baseline. Followed basic commands w/ cues; was intermittently aware of his dx'd Dysphagia ~Dec 2023 (per MBSS then) and the consequences of aspiration/aspiration pneumonia when asked. Stated  he does "remember something about" a Chin Tuck to be used when swallowing (per MBSS then). Pt is followed by Hospice services at home per chart. Pt on [place] O2 support 10L; wbc 16.1; afebrile currently but febrile on admit  OF NOTE: Per MBSS in 04/2022: "oropharyngeal dysphagia largely characterized by impaired timing that allows for penetration and silent aspiration of thin and nectar thick  liquids that spill into the airway prior to airway closure. Pt is capable of achieving adequate airway protection at times, but his timing is not consistent and given that he only coughed x1 during MBS, it would be hard to monitor clinically. Pt had improved containment above the valleculae and out of the laryngeal vestibule with thin liquids when using a chin tuck. This strategy, if it can be used consistently, seems to be more reliable than use of thickener given silent aspiration of nectar thick liquids as well. Education was provided about rationale for chin tuck, how to implement it, and how to account for mixed consistencies given that pt loves to eat milk and cereal but thin liquids via spoon were also aspirated. Pt was open to trying these strategies...".  Pt appears to present w/ CHRONIC oropharyngeal phase Dysphagia, which will increase risk for aspiration/aspiration pneumonia. Per report, pt has has increased episodes of pneumonia in the past year; Chronic COPD. Pt is followed by Hospice services.   During po trials given, no overt clinical s/s of oropharyngeal phase dysphagia noted, however, in setting of known SILENT aspiration during prior MBSS, overt coughing during po intake may not occur. Pt consumed po trials w/ no overt coughing, decline in vocal quality, nor change in respiratory presentation during/post trials. O2 sats remained 96-97% on O2 support. Oral phase appeared grossly [facility] w/ timely bolus management, mashing/gumming, and control of bolus propulsion for A-P transfer for swallowing. Oral clearing achieved w/ all trial consistencies -- moistened, soft foods given. Increased mashing/gumming time given d/t Edentulous status. Pt stated he "sometimes" uses his Dentures at home.  OM Exam appeared grossly [facility] w/ no unilateral weakness noted. Speech Clear. Pt fed self w/ setup support.   Post discussion w/ MD re: pt's CHRONIC Dysphagia and current Hospice status, it was agreed that pt would  continue his home diet of a Mech Soft consistency diet w/ well-Cut meats, moistened foods; Thin liquids -- use of CHIN TUCK when swallowing. Pt should use Cup when drinking. Recommend general aspiration precautions including small, single bites/sips Slowly w/ Rest Breaks in setting of COPD impact on respiratory status overall. Conservation of energy strategies. Sitting up for all oral intake. Recommend Pills WHOLE in Puree for safer, easier swallowing.  Education given on Pills in Puree; food consistencies and easy to eat options; general aspiration precautions to pt. MD/NSG to reconsult if any new needs arise during admit. NSG updated, agreed. MD agreed. Recommend Hospice f/u; Dietician f/u for support. SLP Visit Diagnosis: Dysphagia, oropharyngeal phase (R13.12) (CHRONIC 04/2022 mbss)    Aspiration Risk  Moderate aspiration risk;Risk for inadequate nutrition/hydration    Diet Recommendation   Thin;Dysphagia 3 (mechanical soft) (baseline diet w/ stratetigies; per MD ok) = continue his home diet of a Mech Soft consistency diet w/ well-Cut meats, moistened foods; Thin liquids -- use of CHIN TUCK when swallowing. Pt should use Cup when drinking. Recommend general aspiration precautions including small, single bites/sips Slowly w/ Rest Breaks in setting of COPD impact on respiratory status overall. Conservation of energy strategies. Sitting up for all oral intake.  Medication Administration:  Whole meds with puree (vs Crushed)    Other  Recommendations Recommended Consults:  (Hospice/Palliative Care; Dietician) Oral Care Recommendations: Oral care BID;Oral care before and after PO;Patient independent with oral care (dentures at home)    Recommendations for follow up therapy are one component of a multi-disciplinary discharge planning process, led by the attending physician.  Recommendations may be updated based on patient status, additional functional criteria and insurance authorization.  Follow up  Recommendations Follow physician's recommendations for discharge plan and follow up therapies (at next venue of care) = pt appears at his prior baseline in setting of chronic Dysphagia - MD agreed     Assistance Recommended at Discharge  Full for cues to use chin tuck, as needed  Functional Status Assessment Patient has had a recent decline in their functional status and/or demonstrates limited ability to make significant improvements in function in a reasonable and predictable amount of time  Frequency and Duration  (n/a)   (n/a)       Prognosis Prognosis for improved oropharyngeal function: Guarded Barriers to Reach Goals: Time post onset;Severity of deficits Barriers/Prognosis Comment: chronic dysphagia; COPD      Swallow Study   General Date of Onset: 03/30/23 HPI: Pt is a 82 year old male with history of Chronic, Dx'd Pharyngeal Phase Dysphagia per MBSS 04/2022 w/ SILENT ASPIRATION of Liquids; paroxysmal atrial fibrillation on Eliquis, COPD, history of recurrent pneumonia, heart failure preserved ejection fraction, 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     Per ED documentation, patient's SpO2 was in the 60s upon EMS arrival.  EMS attempted CPAP, patient was unable to tolerate.  Patient is currently on nonrebreather nasal cannula at 10 L in the emergency department.     Vitals in the ED showed temperature of Tmax 102.1, respiration rate of 27, heart rate of 101, blood pressure 160/63, SpO2 of 90% on HFNC.   CXR: A right lower lobe pneumonia is present. The left  lung is clear. No significant effusions or pneumothorax are present. Type of Study: Bedside Swallow Evaluation Previous Swallow Assessment: MBSS 04/2022 Diet Prior to this Study: Dysphagia 3 (mechanical soft);Thin liquids (Level 0) (w/ Chin tuck - risk for aspiration ongoing) Temperature  Spikes Noted: No (wbc 16.1) Respiratory Status: Nasal cannula (10L) History of Recent Intubation: No Behavior/Cognition: Alert;Cooperative;Pleasant mood;Distractible;Requires cueing (decreased insight into his medical issues) Oral Cavity Assessment: Within Functional Limits Oral Care Completed by SLP: Yes Oral Cavity - Dentition: Edentulous Vision: Functional for self-feeding Self-Feeding Abilities: Able to feed self;Needs assist;Needs set up Patient Positioning: Upright in bed (needed some support) Baseline Vocal Quality: Normal Volitional Cough: Strong;Congested Volitional Swallow: Able to elicit    Oral/Motor/Sensory Function Overall Oral Motor/Sensory Function: Within functional limits   Ice Chips Presentation: Spoon (3 trials) Other Comments: w/ chin tuck   Thin Liquid Presentation: Spoon;Self Fed (10 trials) Other Comments: w/ chin tuck; no O2 desats    Nectar Thick Nectar Thick Liquid: Not tested   Honey Thick Honey Thick Liquid: Not tested   Puree Presentation:  (10 trials) Other Comments: w/ chin tuck; no O2 desats   Solid     Presentation: Spoon;Self Fed (6 trials) Other Comments: w/ chin tuck; no O2 desats; extra time for mashing/gumming increased textured foods d/t        Jerilynn Som, MS, CCC-SLP Speech Language Pathologist Rehab Services; [facility] [place] [phone] (ascom)  [place] Giammona 03/31/2023,1:37 PM

## 2023-03-31 NOTE — ED Notes (Signed)
Pt's L forearm IV infiltrated while Rocephin was running. Pharmacy was called and they said no further action was necessary. Remaining Rocephin switched to L AC IV.

## 2023-03-31 NOTE — ED Notes (Signed)
Lab contacted to send phlebotomist

## 2023-03-31 NOTE — Plan of Care (Signed)
  Problem: Fluid Volume: Goal: Hemodynamic stability will improve Outcome: Progressing   Problem: Respiratory: Goal: Ability to maintain adequate ventilation will improve Outcome: Progressing   Problem: Activity: Goal: Risk for activity intolerance will decrease Outcome: Progressing   Problem: Pain Management: Goal: General experience of comfort will improve Outcome: Progressing   Problem: Safety: Goal: Ability to remain free from injury will improve Outcome: Progressing

## 2023-03-31 NOTE — Progress Notes (Signed)
[facility] Hospitalized Hospice Patient  Patrick Montoya is a current hospice patient followed at home for terminal diagnosis of COPD.  Hospice was notified by patient's daughter in law, Carollee Herter, that patient was in route to the hospital due to shortness of breath.  Our agency was not notified prior to transport to the hospital for intervention.  Patient is being admitted to [facility] with diagnosis respiratory failure with pneumonia.  Per Dr. Elliot Gurney, this is a related hospice admission.   Patient resting in bed with oxygen infusing via [place].  Patient SOB at rest.  No complaints today.  Patient with planned blood transfusion today as HGB is 6.4.  No plans for discharge, but plans to go back home with hospice.  Patient's wife is also a hospice patient and they receive help from their son.  Patient is appropriate for GIP LOC for symptom management of shortness of breath requiring continuous care and treatment with IV antibiotics.  Patient requires frequent monitoring and assessment of ordered interventions.   Vital Signs:  T 98.6 (oral), BP 128/66, P 79, R 16, Oxi 94% on 6L.   Abnormal labs:   Creat 0.47, Calcium 7.5, BNP 275.1, WBC 16.1, RBC 2.2, HGB 6.4, HCT 21.4, MCHC 29.9 Nasal Specimen MRSA +  I&O Intake Output  IV MEDS/PRN Lasix 40mg  IV  Solumedrol 40mg  IV Unasyn 3g IV every 6 hrs Iron 300mg  IVPB  Assessment and Plan:  per Dr. Marrion Coy progress note 11.18.24 Acute on chronic hypoxic respiratory failure (HCC) Right lower lobe aspiration pneumonia, recurrent. COPD exacerbation. Patient had a severe respite distress at the time of admission, requiring nonbreather.  Patient was given antibiotics, steroids, oxygen is better, currently on 10 L oxygen with baseline oxygen use up to 1.5 L. I reviewed patient chest x-ray image, patient clearly has a right lower lobe aspiration pneumonia.  Discussed with patient and son, this is the 5th patient had aspiration  pneumonia in this year.  Condition is terminal, patient is still appropriate for hospice.  Patient is admitted to hospital today, will discharge back to hospice when condition is more stable. Obtain speech therapy evaluation.  N.p.o. until evaluation is completed. Also received IV steroids, will start oral prednisone tomorrow.   Paroxysmal atrial fibrillation (HCC) Hold off anticoagulation due to acute anemia.   Acute on chronic iron deficient anemia. Patient hemoglobin has dropped down to 6.4, discussed with patient's son, not sure patient had any GI bleed.  I will discontinue Eliquis this time.  Patient iron level is very low in the recent blood work, will give a dose of IV iron, additionally, will transfuse 1 unit PRBC followed with the 40 mg IV Lasix. Due to poor prognosis, will not perform any additional workup for anemia.   Parkinson's disease Home carbidopa-levodopa IR 25-100 mg tablet, 5 times daily resumed   HTN (hypertension) Continue metoprolol.   Insomnia, persistent Alprazolam 0.5 mg nightly resumed   Peptic ulcer Home equivalent PPI with Protonix 80 mg daily resumed  Discharge Planning:  Ongoing-  back home with hospice care Family contact:  No family present. IDT:  Updated   Goals of Care- DNR   Please call with any hospice related questions or concerns   Norris Cross, RN Nurse Liaison [phone]

## 2023-03-31 NOTE — TOC Initial Note (Signed)
Transition of Care [facility]) - Initial/Assessment Note    Patient Details  Name: Patrick Montoya MRN: [id number] Date of Birth: 09/24/1940  [facility] CM/SW Contact:    Marquita Palms, LCSW Phone Number: 03/31/2023, 11:30 AM  Clinical Narrative:                  CSW met with patient bedside. Patient reports he lives with his wife Earley Abide, but his son cares form them. Patient reports he use [facility] pharmacy and is on O2 @4 .5L daily. Patient reports he does not use  Walker or cane to get around. Patient reports he does not drive but uses [place]. Patient is currently hospice with [facility].       Patient Goals and CMS Choice            Expected Discharge Plan and Services                                              Prior Living Arrangements/Services                       Activities of Daily Living   ADL Screening (condition at time of admission) Independently performs ADLs?: No Does the patient have a NEW difficulty with bathing/dressing/toileting/self-feeding that is expected to last >3 days?: No Does the patient have a NEW difficulty with getting in/out of bed, walking, or climbing stairs that is expected to last >3 days?: No Does the patient have a NEW difficulty with communication that is expected to last >3 days?: No Is the patient deaf or have difficulty hearing?: No Does the patient have difficulty seeing, even when wearing glasses/contacts?: No Does the patient have difficulty concentrating, remembering, or making decisions?: Yes  Permission Sought/Granted                  Emotional Assessment              Admission diagnosis:  Acute on chronic hypoxic respiratory failure (HCC) [J96.21] Patient Active Problem List   Diagnosis Date Noted   Iron deficiency anemia 03/31/2023   Pyuria 03/30/2023   Acute on chronic hypoxic respiratory failure (HCC) 07/27/2022   CAP (community acquired pneumonia) 04/13/2022    Atrial fibrillation (HCC) 02/21/2022   Hypokalemia 02/20/2022   Multifocal pneumonia 08/01/2021   HTN (hypertension) 08/01/2021   Sepsis (HCC) 08/01/2021   BPH (benign prostatic hyperplasia) 08/01/2021   COPD exacerbation (HCC) 07/22/2021   Paroxysmal atrial fibrillation (HCC) 07/22/2021   History of hepatitis D 07/22/2021   Chronic respiratory failure with hypoxia (HCC) 06/29/2021   History of gastric ulcer 06/29/2021   Paroxysmal atrial fibrillation with rapid ventricular response (HCC) 06/29/2021   Rapid atrial fibrillation (HCC) 06/29/2021   Long term (current) use of anticoagulants 06/20/2021   Chest pain    Acute respiratory failure with hypoxia (HCC) 02/26/2021   Sepsis due to undetermined organism (HCC) 02/26/2021   Bacterial pneumonia 02/26/2021   Parkinson's disease 03/26/2019   Sternal fracture with retrosternal contusion, closed, initial encounter 10/31/2017   Chronic bronchitis (HCC) 05/28/2015   Gastro-esophageal reflux disease without esophagitis 05/26/2015   Insomnia, 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   COPD with acute exacerbation (HCC) 04/11/2014   Benign prostatic hyperplasia without urinary obstruction 01/21/2014   Acute bronchitis with chronic obstructive pulmonary disease (COPD) (HCC) 01/21/2014   Accelerated hypertension 01/21/2014  SINUSITIS- ACUTE-NOS 07/03/2007   RENAL CALCULUS 06/05/2007   COPD (chronic obstructive pulmonary disease) (HCC) 02/27/2007   GERD 02/27/2007   Peptic ulcer 02/27/2007   BENIGN PROSTATIC HYPERTROPHY 02/27/2007   Disorder of bursae and tendons in shoulder region 02/27/2007   History of colonic polyps 02/27/2007   FROZEN RIGHT SHOULDER 12/05/2006   PCP:  Barbette Reichmann, MD Pharmacy:   [facility] [address] [place] - [address] [place] [address] Phone: [phone] Fax: [phone]  [facility] [address]), [place] - [address]  [place]) [place] [address] Phone: [phone] Fax: [phone]  [facility] [address] [place] [address] Phone: [phone] Fax: [phone]     Social Determinants of Health (SDOH) Social History: SDOH Screenings   Food Insecurity: Patient Unable To Answer (03/31/2023)  Housing: Patient Unable To Answer (03/31/2023)  Transportation Needs: Patient Unable To Answer (03/31/2023)  Utilities: Patient Unable To Answer (03/31/2023)  Tobacco Use: Medium Risk (03/31/2023)   SDOH Interventions:     Readmission Risk Interventions    03/31/2023   11:28 AM 02/25/2022   12:00 PM 07/25/2021    9:58 AM  Readmission Risk Prevention Plan  Transportation Screening Complete Complete Complete  PCP or Specialist Appt within 3-5 Days Complete Complete Complete  HRI or Home Care Consult Complete Complete Complete  Social Work Consult for Recovery Care Planning/Counseling Complete Complete   Palliative Care Screening Complete Complete Not Applicable  Medication Review Oceanographer) Not Complete Complete Complete  Med Review Comments patient will discuss medication with MD or nurse upon discharge

## 2023-04-01 DIAGNOSIS — J441 Chronic obstructive pulmonary disease with (acute) exacerbation: Secondary | ICD-10-CM | POA: Diagnosis not present

## 2023-04-01 DIAGNOSIS — J690 Pneumonitis due to inhalation of food and vomit: Secondary | ICD-10-CM | POA: Diagnosis not present

## 2023-04-01 DIAGNOSIS — J9621 Acute and chronic respiratory failure with hypoxia: Secondary | ICD-10-CM | POA: Diagnosis not present

## 2023-04-01 LAB — CBC
HCT: 23.5 % — ABNORMAL LOW (ref 39.0–52.0)
Hemoglobin: 7.2 g/dL — ABNORMAL LOW (ref 13.0–17.0)
MCH: 28.6 pg (ref 26.0–34.0)
MCHC: 30.6 g/dL (ref 30.0–36.0)
MCV: 93.3 fL (ref 80.0–100.0)
Platelets: 224 10*3/uL (ref 150–400)
RBC: 2.52 MIL/uL — ABNORMAL LOW (ref 4.22–5.81)
RDW: 15.1 % (ref 11.5–15.5)
WBC: 16.2 10*3/uL — ABNORMAL HIGH (ref 4.0–10.5)
nRBC: 0 % (ref 0.0–0.2)

## 2023-04-01 LAB — BASIC METABOLIC PANEL
Anion gap: 8 (ref 5–15)
BUN: 20 mg/dL (ref 8–23)
CO2: 31 mmol/L (ref 22–32)
Calcium: 8.6 mg/dL — ABNORMAL LOW (ref 8.9–10.3)
Chloride: 104 mmol/L (ref 98–111)
Creatinine, Ser: 0.73 mg/dL (ref 0.61–1.24)
GFR, Estimated: >60 mL/min (ref >60)
Glucose, Bld: 139 mg/dL — ABNORMAL HIGH (ref 70–99)
Potassium: 3.5 mmol/L (ref 3.5–5.1)
Sodium: 143 mmol/L (ref 135–145)

## 2023-04-01 LAB — MAGNESIUM: Magnesium: 2.2 mg/dL (ref 1.7–2.4)

## 2023-04-01 MED ORDER — POTASSIUM CHLORIDE CRYS ER 20 MEQ PO TBCR
40.0000 meq | EXTENDED_RELEASE_TABLET | Freq: Once | ORAL | Status: AC
  Administered 2023-04-01: 40 meq via ORAL
  Filled 2023-04-01: qty 2

## 2023-04-01 MED ORDER — POLYSACCHARIDE IRON COMPLEX 150 MG PO CAPS
150.0000 mg | ORAL_CAPSULE | ORAL | Status: DC
  Administered 2023-04-01 – 2023-04-03 (×3): 150 mg via ORAL
  Filled 2023-04-01 (×3): qty 1

## 2023-04-01 MED ORDER — BACITRACIN-NEOMYCIN-POLYMYXIN OINTMENT TUBE
TOPICAL_OINTMENT | Freq: Two times a day (BID) | CUTANEOUS | Status: DC
  Filled 2023-04-01: qty 14.17

## 2023-04-01 MED ORDER — ORAL CARE MOUTH RINSE: 15.0000 mL | OROMUCOSAL | Status: DC | PRN

## 2023-04-01 MED ORDER — DOXYCYCLINE HYCLATE 100 MG PO TABS
100.0000 mg | ORAL_TABLET | Freq: Two times a day (BID) | ORAL | Status: DC
  Administered 2023-04-01 – 2023-04-03 (×5): 100 mg via ORAL
  Filled 2023-04-01 (×6): qty 1

## 2023-04-01 MED ORDER — MUPIROCIN 2 % EX OINT
TOPICAL_OINTMENT | Freq: Two times a day (BID) | CUTANEOUS | Status: DC
  Filled 2023-04-01: qty 22

## 2023-04-01 NOTE — Plan of Care (Signed)
  Problem: Respiratory: Goal: Ability to maintain adequate ventilation will improve Outcome: Not Progressing   Problem: Education: Goal: Knowledge of General Education information will improve Description: Including pain rating scale, medication(s)/side effects and non-pharmacologic comfort measures Outcome: Progressing   Problem: Clinical Measurements: Goal: Cardiovascular complication will be avoided Outcome: Progressing   Problem: Activity: Goal: Risk for activity intolerance will decrease Outcome: Progressing

## 2023-04-01 NOTE — Progress Notes (Signed)
Progress Note   Patient: Patrick Montoya OAC:[id number] DOB: Mar 18, 1941 DOA: 03/30/2023     2 DOS: the patient was seen and examined on 04/01/2023   Brief hospital course: 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  Per ED documentation, patient's SpO2 was in the 60s upon EMS arrival.  Patient was placed on nonrebreather.  She also had temperature of 102.1. Chest x-ray showed a right lower lobe pneumonia.  Patient was placed on antibiotics for aspiration pneumonia.  Discussed with patient and son, this is the fifth time patient had aspiration pneumonia this year.   Principal Problem:   Acute on chronic hypoxic respiratory failure (HCC) Active Problems:   Aspiration pneumonia of right lower lobe (HCC)   Paroxysmal atrial fibrillation (HCC)   Parkinson's disease   GERD   Peptic ulcer   Insomnia, persistent   COPD with acute exacerbation (HCC)   HTN (hypertension)   Pyuria   Iron deficiency anemia   Aspiration pneumonia (HCC)   Assessment and Plan: * Acute on chronic hypoxic respiratory failure (HCC) Right lower lobe aspiration pneumonia, recurrent. COPD exacerbation. Patient had a severe respite distress at the time of admission, requiring nonbreather.  Patient was given antibiotics, steroids, oxygen is better, currently on 10 L oxygen with baseline oxygen use up to 1.5 L. I reviewed patient chest x-ray image, patient clearly has a right lower lobe aspiration pneumonia.  Discussed with patient and son, this is the 5th patient had aspiration pneumonia in this year.  Condition is terminal, patient is still appropriate for hospice.  Patient is admitted to hospital today,  will discharge back to hospice when condition is more stable. Patient has been evaluated by speech therapy, placed on dysphagia diet.  Continue Unasyn. Oxygenation seems to be better today.   Paroxysmal atrial fibrillation (HCC) Hold off anticoagulation due to acute anemia.   Acute on chronic iron deficient anemia. Patient hemoglobin has dropped down to 6.4, discussed with patient's son, not sure patient had any GI bleed.  I will discontinue Eliquis this time.  He received IV iron, he also received 1 unit PRBC followed with 40 mg IV Lasix.  Hemoglobin today 7.2, no evidence of bleeding. Recheck a CBC tomorrow.     Parkinson's disease Home carbidopa-levodopa IR 25-100 mg tablet, 5 times daily resumed   HTN (hypertension) Continue metoprolol.   Insomnia, persistent Alprazolam 0.5 mg nightly resumed   Peptic ulcer Home equivalent PPI with Protonix 80 mg daily resumed      Subjective:  Patient feels better today, currently on 5 L oxygen which is still above baseline.  Cough, nonproductive.  Physical Exam: Vitals:   03/31/23 2245 04/01/23 0835 04/01/23 0840 04/01/23 1424  BP: 121/65 126/70    Pulse: 79 70    Resp: 18 (!) 24    Temp: 98.6 F (37 C) 97.8 F (36.6 C)    TempSrc:      SpO2: 95% 99% 93% 94%  Weight:      Height:       General exam: Appears calm and comfortable  Respiratory system: Crackles in the right base.Marland Kitchen Respiratory effort normal. Cardiovascular system: S1 & S2 heard, RRR. No JVD, murmurs, rubs, gallops or clicks. No pedal edema. Gastrointestinal  system: Abdomen is nondistended, soft and nontender. No organomegaly or masses felt. Normal bowel sounds heard. Central nervous system: Alert and oriented x2. No focal neurological deficits. Extremities: Symmetric 5 x 5 power. Skin: No rashes, lesions or ulcers Psychiatry: Mood & affect appropriate.    Data Reviewed:  Lab results reviewed.  Family Communication: None  Disposition: Status is:  Inpatient Remains inpatient appropriate because: Severity of disease, IV treatment.     Time spent: 35 minutes  Author: Marrion Coy, MD 04/01/2023 3:22 PM  For on call review [email or web address].

## 2023-04-01 NOTE — Progress Notes (Signed)
Informed Jawo NP about hemoglobin of 7.2 after 1 unit of packed RBC. No new orders.

## 2023-04-01 NOTE — Progress Notes (Signed)
[facility] Patient   Patrick Montoya is a current hospice patient followed at home for terminal diagnosis of COPD.  Hospice was notified by patient's daughter in law, Carollee Herter, that patient was in route to the hospital due to shortness of breath.  Our agency was not notified prior to transport to the hospital for intervention.  Patient is being admitted to [facility] with diagnosis respiratory failure with pneumonia.  Per Dr. Elliot Gurney, this is a related hospice admission.   Patient sitting up in bed with 02 in place.  Patient is alert and oriented X3.  Patient stated that he is starting to feel better.  He reported that he doesn't remember what happened to him until about Sunday evening.  Patient said that the attending MD informed him that he may be able to discharge home on Friday.     Patient is appropriate for GIP LOC for symptom management of shortness of breath requiring continuous care and treatment with IV antibiotics.  Patient requires frequent monitoring and assessment of ordered interventions.   Vital Signs: 97.8, 126/70,  70,  24,  93% on 5L/min   Abnormal labs:  glucose 139,  calcium 8.6,  WBC 16.2,  RBC 2.52, Hemoglobin 7.2,  HCT 23.5   I&O:  980/2575   IV MEDS/PRN:  Unasyn 3g q 6 hr IV X3,  Venofer 300mg  once IV,  Rocephin 2g q 24 hr IV X !,  Robinul 0.5 mg 3 times a day prn po x 1.    Problem List:   * Acute on chronic hypoxic respiratory failure (HCC) Right lower lobe aspiration pneumonia, recurrent. COPD exacerbation. Patient had a severe respite distress at the time of admission, requiring nonbreather.  Patient was given antibiotics, steroids, oxygen is better, currently on 10 L oxygen with baseline oxygen use up to 1.5 L. I reviewed patient chest x-ray image, patient clearly has a right lower lobe aspiration pneumonia.  Discussed with patient and son, this is the 5th patient had aspiration pneumonia in this year.  Condition is terminal,  patient is still appropriate for hospice.  Patient is admitted to hospital today, will discharge back to hospice when condition is more stable. Patient has been evaluated by speech therapy, placed on dysphagia diet.  Continue Unasyn. Oxygenation seems to be better today.   Paroxysmal atrial fibrillation (HCC) Hold off anticoagulation due to acute anemia.   Acute on chronic iron deficient anemia. Patient hemoglobin has dropped down to 6.4, discussed with patient's son, not sure patient had any GI bleed.  I will discontinue Eliquis this time.  He received IV iron, he also received 1 unit PRBC followed with 40 mg IV Lasix.  Hemoglobin today 7.2, no evidence of bleeding. Recheck a CBC tomorrow.     Parkinson's disease Home carbidopa-levodopa IR 25-100 mg tablet, 5 times daily resumed   HTN (hypertension) Continue metoprolol.   Insomnia, persistent Alprazolam 0.5 mg nightly resumed   Peptic ulcer Home equivalent PPI with Protonix 80 mg daily resumed      Discharge Planning:  Ongoing-  back home with hospice care-possibly Friday. Family contact: Met with patient at the bedside.   IDT:  Updated   Goals of Care- DNR   Please call with any hospice related questions or concerns  [facility] Liaison [phone]

## 2023-04-01 NOTE — Plan of Care (Signed)

## 2023-04-02 DIAGNOSIS — I480 Paroxysmal atrial fibrillation: Secondary | ICD-10-CM | POA: Diagnosis not present

## 2023-04-02 DIAGNOSIS — D500 Iron deficiency anemia secondary to blood loss (chronic): Secondary | ICD-10-CM

## 2023-04-02 DIAGNOSIS — I10 Essential (primary) hypertension: Secondary | ICD-10-CM

## 2023-04-02 DIAGNOSIS — G4700 Insomnia, unspecified: Secondary | ICD-10-CM

## 2023-04-02 DIAGNOSIS — J690 Pneumonitis due to inhalation of food and vomit: Secondary | ICD-10-CM | POA: Diagnosis not present

## 2023-04-02 DIAGNOSIS — J441 Chronic obstructive pulmonary disease with (acute) exacerbation: Secondary | ICD-10-CM | POA: Diagnosis not present

## 2023-04-02 DIAGNOSIS — K2100 Gastro-esophageal reflux disease with esophagitis, without bleeding: Secondary | ICD-10-CM

## 2023-04-02 DIAGNOSIS — K279 Peptic ulcer, site unspecified, unspecified as acute or chronic, without hemorrhage or perforation: Secondary | ICD-10-CM

## 2023-04-02 DIAGNOSIS — G20B1 Parkinson's disease with dyskinesia, without mention of fluctuations: Secondary | ICD-10-CM

## 2023-04-02 LAB — CBC
HCT: 22.3 % — ABNORMAL LOW (ref 39.0–52.0)
Hemoglobin: 6.9 g/dL — ABNORMAL LOW (ref 13.0–17.0)
MCH: 29.2 pg (ref 26.0–34.0)
MCHC: 30.9 g/dL (ref 30.0–36.0)
MCV: 94.5 fL (ref 80.0–100.0)
Platelets: 202 10*3/uL (ref 150–400)
RBC: 2.36 MIL/uL — ABNORMAL LOW (ref 4.22–5.81)
RDW: 15.1 % (ref 11.5–15.5)
WBC: 12 10*3/uL — ABNORMAL HIGH (ref 4.0–10.5)
nRBC: 0 % (ref 0.0–0.2)

## 2023-04-02 LAB — PREPARE RBC (CROSSMATCH)

## 2023-04-02 LAB — BASIC METABOLIC PANEL
Anion gap: 5 (ref 5–15)
BUN: 22 mg/dL (ref 8–23)
CO2: 31 mmol/L (ref 22–32)
Calcium: 8.7 mg/dL — ABNORMAL LOW (ref 8.9–10.3)
Chloride: 107 mmol/L (ref 98–111)
Creatinine, Ser: 0.84 mg/dL (ref 0.61–1.24)
GFR, Estimated: >60 mL/min (ref >60)
Glucose, Bld: 118 mg/dL — ABNORMAL HIGH (ref 70–99)
Potassium: 3.7 mmol/L (ref 3.5–5.1)
Sodium: 143 mmol/L (ref 135–145)

## 2023-04-02 MED ORDER — ACETAMINOPHEN 325 MG PO TABS
650.0000 mg | ORAL_TABLET | Freq: Once | ORAL | Status: AC
  Administered 2023-04-02: 650 mg via ORAL
  Filled 2023-04-02: qty 2

## 2023-04-02 MED ORDER — FLUTICASONE PROPIONATE 50 MCG/ACT NA SUSP
2.0000 | Freq: Every day | NASAL | Status: DC
  Administered 2023-04-02 – 2023-04-03 (×2): 2 via NASAL
  Filled 2023-04-02: qty 16

## 2023-04-02 MED ORDER — FUROSEMIDE 10 MG/ML IJ SOLN
20.0000 mg | Freq: Once | INTRAMUSCULAR | Status: AC
  Administered 2023-04-02: 20 mg via INTRAVENOUS
  Filled 2023-04-02: qty 4

## 2023-04-02 MED ORDER — SODIUM CHLORIDE 0.9% IV SOLUTION: Freq: Once | INTRAVENOUS | Status: AC

## 2023-04-02 MED ORDER — IRON SUCROSE 300 MG IVPB - SIMPLE MED
300.0000 mg | Freq: Once | Status: AC
  Administered 2023-04-02: 300 mg via INTRAVENOUS
  Filled 2023-04-02: qty 300

## 2023-04-02 MED ORDER — BUDESONIDE 0.25 MG/2ML IN SUSP
0.2500 mg | Freq: Two times a day (BID) | RESPIRATORY_TRACT | Status: DC
  Administered 2023-04-02 – 2023-04-03 (×3): 0.25 mg via RESPIRATORY_TRACT
  Filled 2023-04-02 (×3): qty 2

## 2023-04-02 NOTE — Assessment & Plan Note (Signed)
Patient on Unasyn and doxycycline.

## 2023-04-02 NOTE — Progress Notes (Signed)
Patrick Montoya 89 [facility] Patient   Patrick Montoya is a current hospice patient followed at home for terminal diagnosis of COPD.  Hospice was notified by patient's daughter in law, Patrick Montoya, that patient was in route to the hospital due to shortness of breath.  Our agency was not notified prior to transport to the hospital for intervention.  Patient is being admitted to [facility] with diagnosis respiratory failure with pneumonia.  Per Dr. Elliot Gurney, this is a related hospice admission.   Visited patient in hospital. He is resting quietly without complaint at this time. He reports concern over his HGB continuing to drop and needing another blood transfusion today. He hopes to be able to go home Friday.   Patient remains GIP appropriate receiving IV antibiotics, blood product transfusion and close monitoring with recent respiratory failure.   Vital Signs: 98/67/16    157/86     O2 sat 96% on 3LPM   Abnormal labs:  Glu 118, Ca 8.7, WBC 12, HBG 6.9   I&O:  681/580   IV MEDS/PRN:  Lasix 20mg  IV x 1, Unasyn 3G IV every 6 hours, Robinul 0.5mg  po x 1, transfusion 1 unit RBC today.   Problem List:    Iron deficiency anemia due to chronic blood loss Hemoglobin 6.9 today.  Transfuse 1 unit of packed red blood cells.  Patient also received 1 unit on 11/18.  Will also give IV iron this evening.  Continue to hold Eliquis.   Aspiration pneumonia of right lower lobe (HCC) Patient on Unasyn and doxycycline.   COPD with acute exacerbation (HCC) Continue prednisone and nebulizer treatments.   Paroxysmal atrial fibrillation (HCC) Holding Eliquis since the patient required 2 units of packed red blood cells during hospital course.   Acute on chronic hypoxic respiratory failure (HCC) Patient currently on 3 L.  Initially required high flow nasal cannula 10 L on presentation.   Peptic ulcer Continue PPI  Discharge Planning:  Ongoing-  back home with hospice  care-possibly Friday.  Family contact: Patient is his own spokesperson    IDT:  Updated    Goals of Care- DNR   Please call with any hospice related questions or concerns   [facility], BSN, [place], [place] [phone]

## 2023-04-02 NOTE — Plan of Care (Signed)
  Problem: Fluid Volume: Goal: Hemodynamic stability will improve Outcome: Progressing   Problem: Clinical Measurements: Goal: Diagnostic test results will improve Outcome: Progressing   Problem: Education: Goal: Knowledge of General Education information will improve Description: Including pain rating scale, medication(s)/side effects and non-pharmacologic comfort measures Outcome: Progressing   Problem: Health Behavior/Discharge Planning: Goal: Ability to manage health-related needs will improve Outcome: Progressing   Problem: Activity: Goal: Risk for activity intolerance will decrease Outcome: Progressing

## 2023-04-02 NOTE — Progress Notes (Signed)
Progress Note   Patient: Patrick Montoya OZH:[id number] DOB: 04-29-1941 DOA: 03/30/2023     3 DOS: the patient was seen and examined on 04/02/2023   Brief hospital course: 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  Per ED documentation, patient's SpO2 was in the 60s upon EMS arrival.  Patient was placed on nonrebreather.  She also had temperature of 102.1. Chest x-ray showed a right lower lobe pneumonia.  Patient was placed on antibiotics for aspiration pneumonia.  Discussed with patient and son, this is the fifth time patient had aspiration pneumonia this year.  11/20.  Patient still has diffuse wheezing throughout.  Hemoglobin dipped down to 6.9.  Patient will be transfused a another unit of packed red blood cells today.  Assessment and Plan: * Iron deficiency anemia due to chronic blood loss Hemoglobin 6.9 today.  Transfuse 1 unit of packed red blood cells.  Patient also received 1 unit on 11/18.  Will also give IV iron this evening.  Continue to hold Eliquis.  Aspiration pneumonia of right lower lobe (HCC) Patient on Unasyn and doxycycline.  COPD with acute exacerbation (HCC) Continue prednisone and nebulizer treatments.  Paroxysmal atrial fibrillation (HCC) Holding Eliquis since the patient required 2 units of packed red blood cells during hospital course.  Parkinson's disease Home carbidopa-levodopa IR 25-100 mg tablet, 5 times daily resumed  Acute on chronic hypoxic respiratory failure (HCC) Patient currently on 3 L.  Initially required high flow nasal cannula 10 L on presentation.  HTN (hypertension) Continue metoprolol  Insomnia, persistent Alprazolam 0.5  mg nightly resumed  Peptic ulcer Continue PPI  GERD Continue Protonix        Subjective: Patient still having some wheezing.  Hemoglobin this morning down to 6.9.  Patient agreeable to blood transfusion.  Patient denies any signs of bleeding.  Was on Eliquis as outpatient but currently on hold.  Physical Exam: Vitals:   04/02/23 0811 04/02/23 1307 04/02/23 1325 04/02/23 1426  BP: (!) 157/86 (!) 161/88 (!) 167/80   Pulse: 67 (!) 56 60   Resp:  15 17   Temp:  98.1 F (36.7 C) 98.2 F (36.8 C)   TempSrc:   Oral   SpO2:  97% 97% 96%  Weight:      Height:       Physical Exam HENT:     Head: Normocephalic.     Mouth/Throat:     Pharynx: No oropharyngeal exudate.  Eyes:     General: Lids are normal.     Conjunctiva/sclera: Conjunctivae normal.  Cardiovascular:     Rate and Rhythm: Normal rate and regular rhythm.     Heart sounds: Normal heart sounds, S1 normal and S2 normal.  Pulmonary:     Breath sounds: Examination of the right-middle field reveals wheezing. Examination of the left-middle field reveals wheezing. Examination of the right-lower field reveals decreased breath sounds and wheezing. Examination of the left-lower field reveals decreased breath sounds and wheezing. Decreased breath sounds and wheezing present. No rhonchi or rales.  Abdominal:     Palpations: Abdomen is soft.     Tenderness: There is no abdominal tenderness.  Musculoskeletal:     Right lower leg: No swelling.     Left  lower leg: No swelling.  Skin:    General: Skin is warm.     Findings: No rash.  Neurological:     Mental Status: He is alert and oriented to person, place, and time.     Data Reviewed: Creatinine 0.84, last ferritin 75, hemoglobin 6.9  Family Communication: Spoke with son on the phone  Disposition: Status is: Inpatient Remains inpatient appropriate because: Transfuse 1 unit of packed red blood cells today.  Continue steroids for COPD exacerbation.  Planned Discharge  Destination: Home with hospice following.    Time spent: 28 minutes  Author: Alford Highland, MD 04/02/2023 2:44 PM  For on call review [email or web address].

## 2023-04-02 NOTE — Assessment & Plan Note (Signed)
Continue Protonix °

## 2023-04-02 NOTE — Care Management Important Message (Signed)
Important Message  Patient Details  Name: ORLAN RYCHLIK MRN: [id number] Date of Birth: 1940/09/30   Important Message Given:  N/A - LOS <3 / Initial given by admissions     Olegario Messier A Audreena Sachdeva 04/02/2023, 9:34 AM

## 2023-04-02 NOTE — Plan of Care (Signed)
  Problem: Respiratory: Goal: Ability to maintain adequate ventilation will improve Outcome: Progressing   Problem: Education: Goal: Knowledge of General Education information will improve Description: Including pain rating scale, medication(s)/side effects and non-pharmacologic comfort measures Outcome: Progressing   Problem: Health Behavior/Discharge Planning: Goal: Ability to manage health-related needs will improve Outcome: Progressing   Problem: Clinical Measurements: Goal: Ability to maintain clinical measurements within normal limits will improve Outcome: Progressing Goal: Will remain free from infection Outcome: Progressing Goal: Diagnostic test results will improve Outcome: Progressing Goal: Respiratory complications will improve Outcome: Progressing   Problem: Activity: Goal: Risk for activity intolerance will decrease Outcome: Progressing   Problem: Skin Integrity: Goal: Risk for impaired skin integrity will decrease Outcome: Progressing

## 2023-04-02 NOTE — Assessment & Plan Note (Signed)
Patient received 2 units of packed red blood cells during the hospital course.  I also gave IV iron.  Patient hemoglobin is 8.6 upon discharge.  Patient wants to hold Eliquis at this time.

## 2023-04-03 DIAGNOSIS — J690 Pneumonitis due to inhalation of food and vomit: Secondary | ICD-10-CM | POA: Diagnosis not present

## 2023-04-03 DIAGNOSIS — J9621 Acute and chronic respiratory failure with hypoxia: Secondary | ICD-10-CM | POA: Diagnosis not present

## 2023-04-03 DIAGNOSIS — B370 Candidal stomatitis: Secondary | ICD-10-CM | POA: Insufficient documentation

## 2023-04-03 DIAGNOSIS — D500 Iron deficiency anemia secondary to blood loss (chronic): Secondary | ICD-10-CM | POA: Diagnosis not present

## 2023-04-03 DIAGNOSIS — J441 Chronic obstructive pulmonary disease with (acute) exacerbation: Secondary | ICD-10-CM | POA: Diagnosis not present

## 2023-04-03 LAB — CBC
HCT: 27.3 % — ABNORMAL LOW (ref 39.0–52.0)
Hemoglobin: 8.6 g/dL — ABNORMAL LOW (ref 13.0–17.0)
MCH: 28.6 pg (ref 26.0–34.0)
MCHC: 31.5 g/dL (ref 30.0–36.0)
MCV: 90.7 fL (ref 80.0–100.0)
Platelets: 223 10*3/uL (ref 150–400)
RBC: 3.01 MIL/uL — ABNORMAL LOW (ref 4.22–5.81)
RDW: 15.8 % — ABNORMAL HIGH (ref 11.5–15.5)
WBC: 9.9 10*3/uL (ref 4.0–10.5)
nRBC: 0.2 % (ref 0.0–0.2)

## 2023-04-03 LAB — TYPE AND SCREEN
ABO/RH(D): A POS
Antibody Screen: NEGATIVE
Unit division: 0
Unit division: 0

## 2023-04-03 LAB — BPAM RBC
Blood Product Expiration Date: 202411282359
Blood Product Expiration Date: 202412202359
ISSUE DATE / TIME: 202411201303
ISSUE DATE / TIME: 202411181532
Unit Type and Rh: 600
Unit Type and Rh: 6200

## 2023-04-03 MED ORDER — AMOXICILLIN-POT CLAVULANATE 875-125 MG PO TABS: 1.0000 | ORAL_TABLET | Freq: Two times a day (BID) | ORAL | 0 refills | Status: AC

## 2023-04-03 MED ORDER — MAGNESIUM SULFATE 2 GM/50ML IV SOLN
2.0000 g | Freq: Once | INTRAVENOUS | Status: AC
  Administered 2023-04-03: 2 g via INTRAVENOUS
  Filled 2023-04-03: qty 50

## 2023-04-03 MED ORDER — ENSURE ENLIVE PO LIQD: 237.0000 mL | Freq: Two times a day (BID) | ORAL | 0 refills | Status: AC

## 2023-04-03 MED ORDER — IBUPROFEN 400 MG PO TABS: 600.0000 mg | ORAL_TABLET | Freq: Four times a day (QID) | ORAL | Status: DC | PRN

## 2023-04-03 MED ORDER — PREDNISONE 10 MG PO TABS: ORAL_TABLET | ORAL | Status: AC

## 2023-04-03 MED ORDER — FLUCONAZOLE 100 MG PO TABS
200.0000 mg | ORAL_TABLET | Freq: Once | ORAL | Status: AC
  Administered 2023-04-03: 200 mg via ORAL
  Filled 2023-04-03: qty 2

## 2023-04-03 MED ORDER — FLUCONAZOLE 100 MG PO TABS: 100.0000 mg | ORAL_TABLET | Freq: Every day | ORAL | Status: DC

## 2023-04-03 MED ORDER — DOXYCYCLINE HYCLATE 100 MG PO TABS: 100.0000 mg | ORAL_TABLET | Freq: Two times a day (BID) | ORAL | 0 refills | Status: AC

## 2023-04-03 MED ORDER — METOPROLOL TARTRATE 25 MG PO TABS: 25.0000 mg | ORAL_TABLET | Freq: Two times a day (BID) | ORAL | 0 refills | Status: AC

## 2023-04-03 MED ORDER — FLUCONAZOLE 100 MG PO TABS: 100.0000 mg | ORAL_TABLET | Freq: Every day | ORAL | 0 refills | Status: AC

## 2023-04-03 MED ORDER — ACETAMINOPHEN 325 MG PO TABS: 650.0000 mg | ORAL_TABLET | Freq: Four times a day (QID) | ORAL | Status: AC | PRN

## 2023-04-03 MED ORDER — FLUTICASONE PROPIONATE 50 MCG/ACT NA SUSP: 2.0000 | Freq: Every day | NASAL | 0 refills | Status: AC

## 2023-04-03 MED ORDER — POLYSACCHARIDE IRON COMPLEX 150 MG PO CAPS: 150.0000 mg | ORAL_CAPSULE | ORAL | 0 refills | Status: AC

## 2023-04-03 MED ORDER — KETOROLAC TROMETHAMINE 15 MG/ML IJ SOLN
15.0000 mg | Freq: Once | INTRAMUSCULAR | Status: AC
  Administered 2023-04-03: 15 mg via INTRAVENOUS
  Filled 2023-04-03: qty 1

## 2023-04-03 NOTE — Progress Notes (Signed)
Pt discharged home via EMS. Alert and oriented X4 with no complains. No signs and symptoms of distress noted.

## 2023-04-03 NOTE — Assessment & Plan Note (Signed)
Prescribe Diflucan

## 2023-04-03 NOTE — Plan of Care (Signed)

## 2023-04-03 NOTE — Plan of Care (Signed)
Patient discharged per MD orders at this time.All dc instructions, education and medications reviewed with the patient.Pt expressed understanding and will comply with dc instructions.follow up appointments was also communicated to the Pt.no verbal c/o or any ssx of distress.Pt was dc home with [facility] hospice services per order.patient was transported by 2 [facility] personnel on a stretcher.

## 2023-04-03 NOTE — Care Management Important Message (Signed)
Important Message  Patient Details  Name: Patrick Montoya MRN: [id number] Date of Birth: Mar 07, 1941   Important Message Given:  Other (see comment)  Patient to return home with Hospice services. Out of respect for the patient and family no Important Message from [facility] given.    Olegario Messier A Blanca Thornton 04/03/2023, 10:17 AM

## 2023-04-03 NOTE — Discharge Summary (Signed)
Physician Discharge Summary   Patient: Patrick Montoya MRN: [id number] DOB: 28-Nov-1940  Admit date:     03/30/2023  Discharge date: 04/03/23  Discharge Physician: Alford Highland   PCP: Barbette Reichmann, MD   Recommendations at discharge:   Follow-up PCP 5 days  Discharge Diagnoses: Principal Problem:   Acute on chronic hypoxic respiratory failure (HCC) Active Problems:   Aspiration pneumonia of right lower lobe (HCC)   Iron deficiency anemia due to chronic blood loss   COPD with acute exacerbation (HCC)   Paroxysmal atrial fibrillation (HCC)   Parkinson's disease   GERD   Peptic ulcer   Insomnia, persistent   HTN (hypertension)   Pyuria   Aspiration pneumonia (HCC)   Thrush  Resolved Problems:   Acute on chronic respiratory failure with hypoxia [facility])  Hospital Course: Patrick Montoya is a 82 year old male with history of paroxysmal atrial fibrillation on Eliquis, COPD, history of recurrent pneumonia, heart failure preserved ejection fraction, 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  Per ED documentation, patient's SpO2 was in the 60s upon EMS arrival.  Patient was placed on nonrebreather.  She also had temperature of 102.1. Chest x-ray showed a right lower lobe pneumonia.  Patient was placed on antibiotics for aspiration pneumonia.  Discussed with patient and son, this is the fifth time patient had aspiration pneumonia this year.  11/20.  Patient still has diffuse wheezing throughout.  Hemoglobin dipped down to 6.9.  Patient will be transfused a another unit of packed red blood cells today. 11/21.  Patient had a severe headache this morning patient was given IV magnesium and IV Toradol and patient was much better in the afternoon and wanted to go home.  Patient's wheezing has improved.  Patient responded to blood transfusion and  hemoglobin up to 8.6.  Assessment and Plan: * Acute on chronic hypoxic respiratory failure (HCC) Patient currently on 3 L.  Initially required high flow nasal cannula 10 L on presentation.  Iron deficiency anemia due to chronic blood loss Patient received 2 units of packed red blood cells during the hospital course.  I also gave IV iron.  Patient hemoglobin is 8.6 upon discharge.  Patient wants to hold Eliquis at this time.  Aspiration pneumonia of right lower lobe (HCC) Patient on Unasyn and doxycycline during the hospital course and will switch over to Augmentin and doxycycline upon discharge.  COPD with acute exacerbation (HCC) Continue prednisone taper to usual prednisone dose and nebulizer treatments.  Paroxysmal atrial fibrillation (HCC) Holding Eliquis since the patient required 2 units of packed red blood cells during hospital course.  I explained to the patient and patient's son that the risk of stroke is higher but if we go back on the Eliquis risk of bleeding is there.  Patient wishes to hold off on Eliquis.  Parkinson's disease Home carbidopa-levodopa IR 25-100 mg tablet, 5 times daily resumed  Thrush Prescribe Diflucan  HTN (hypertension) Continue metoprolol  Insomnia, persistent Alprazolam 0.5 mg nightly resumed  Peptic ulcer Continue PPI  GERD Continue Protonix         Consultants: Hospice Procedures performed: None Disposition: Home with hospice Diet recommendation:  Cardiac diet DISCHARGE MEDICATION: Allergies as of 04/03/2023   No Known Allergies      Medication List     STOP taking these medications    amiodarone 200 MG tablet Commonly known  as: PACERONE   apixaban 5 MG Tabs tablet Commonly known as: ELIQUIS   budesonide-formoterol 160-4.5 MCG/ACT inhaler Commonly known as: Symbicort   [facility] Vitamin B-12 2000 MCG Tbcr Generic drug: Cyanocobalamin   ferrous gluconate 324 MG tablet Commonly known as: FERGON       TAKE these  medications    acetaminophen 325 MG tablet Commonly known as: TYLENOL Take 2 tablets (650 mg total) by mouth every 6 (six) hours as needed for mild pain (pain score 1-3) (Montoya Fever >/= 101).   albuterol 108 (90 Base) MCG/ACT inhaler Commonly known as: VENTOLIN HFA Inhale 2 puffs into the lungs every 6 (six) hours as needed for wheezing Montoya shortness of breath.   ALPRAZolam 0.5 MG tablet Commonly known as: XANAX Take 0.5 mg by mouth at bedtime.   amoxicillin-clavulanate 875-125 MG tablet Commonly known as: AUGMENTIN Take 1 tablet by mouth 2 (two) times daily for 5 doses.   carbidopa-levodopa 25-100 MG tablet Commonly known as: SINEMET IR Take 2 tablets by mouth 5 (five) times daily. Takes at 0800, 1200, 1600, 2000, and 00:00 (midnight)   doxycycline 100 MG tablet Commonly known as: VIBRA-TABS Take 1 tablet (100 mg total) by mouth every 12 (twelve) hours for 5 doses.   feeding supplement Liqd Take 237 mLs by mouth 2 (two) times daily between meals.   fexofenadine 180 MG tablet Commonly known as: ALLEGRA Take 180 mg by mouth daily. Notes to patient: Not given in the hospital   fluconazole 100 MG tablet Commonly known as: DIFLUCAN Take 1 tablet (100 mg total) by mouth daily. Start taking on: April 04, 2023   fluticasone 50 MCG/ACT nasal spray Commonly known as: FLONASE Place 2 sprays into both nostrils daily. Start taking on: April 04, 2023   glycopyrrolate 1 MG tablet Commonly known as: ROBINUL Take 0.5 tablets (0.5 mg total) by mouth 3 (three) times daily as needed (excessive secretions). What changed: how much to take   guaiFENesin 600 MG 12 hr tablet Commonly known as: MUCINEX Take 600 mg by mouth 2 (two) times daily.   ipratropium-albuterol 0.5-2.5 (3) MG/3ML Soln Commonly known as: DUONEB Take 3 mLs by nebulization every 2 (two) hours as needed (ehzzeing / shortness of breath).   iron polysaccharides 150 MG capsule Commonly known as: NIFEREX Take 1  capsule (150 mg total) by mouth daily.   metoprolol tartrate 25 MG tablet Commonly known as: LOPRESSOR Take 1 tablet (25 mg total) by mouth 2 (two) times daily.   montelukast 10 MG tablet Commonly known as: SINGULAIR Take 10 mg by mouth at bedtime. Notes to patient: Not given in the hospital   morphine CONCENTRATE 10 mg / 0.5 ml concentrated solution Take 5 mg by mouth every 2 (two) hours as needed for shortness of breath (pain). Take 0.33ml (5mg ) Notes to patient: Not given in the hospital   omeprazole 40 MG capsule Commonly known as: PRILOSEC Take 40 mg by mouth daily. Notes to patient: Not given in the hospital   predniSONE 10 MG tablet Commonly known as: DELTASONE 3 tabs po day 1; 2 tabs po day 2,3 and then 1 tab po daily afterwards What changed:  how much to take how to take this when to take this additional instructions   QUEtiapine 50 MG tablet Commonly known as: SEROQUEL Take 50 mg by mouth at bedtime.   roflumilast 500 MCG Tabs tablet Commonly known as: DALIRESP Take 500 mcg by mouth daily.   sennosides-docusate sodium 8.6-50 MG tablet Commonly  known as: SENOKOT-S Take 1 tablet by mouth 2 (two) times daily. Notes to patient: Not given in the hospital        Follow-up Information     Barbette Reichmann, MD Follow up on 04/08/2023.   Specialty: Internal Medicine Why: 9:45 with PA Rudene Re information: [address] [place] [address] [phone]                Discharge Exam: Ceasar Mons Weights   03/30/23 1122  Weight: 75.6 kg   Physical Exam HENT:     Head: Normocephalic.     Mouth/Throat:     Comments: Thrush on tongue Eyes:     General: Lids are normal.     Conjunctiva/sclera: Conjunctivae normal.  Cardiovascular:     Rate and Rhythm: Normal rate and regular rhythm.     Heart sounds: Normal heart sounds, S1 normal and S2 normal.  Pulmonary:     Breath sounds: Examination of the right-lower field  reveals decreased breath sounds. Examination of the left-lower field reveals decreased breath sounds. Decreased breath sounds present. No wheezing, rhonchi Montoya rales.  Abdominal:     Palpations: Abdomen is soft.     Tenderness: There is no abdominal tenderness.  Musculoskeletal:     Right lower leg: No swelling.     Left lower leg: No swelling.  Skin:    General: Skin is warm.     Findings: No rash.  Neurological:     Mental Status: He is alert and oriented to person, place, and time.      Condition at discharge: stable  The results of significant diagnostics from this hospitalization (including imaging, microbiology, ancillary and laboratory) are listed below for reference.   Imaging Studies: DG Chest Port 1 View  Result Date: 03/30/2023 CLINICAL DATA:  Shortness of breath.  Sepsis. EXAM: PORTABLE CHEST 1 VIEW COMPARISON:  One-view chest x-ray 07/27/2022 FINDINGS: Heart size is normal. Atherosclerotic calcifications are present at the aortic arch. A right lower lobe pneumonia is present. The left lung is clear. No significant effusions Montoya pneumothorax are present. IMPRESSION: Right lower lobe pneumonia. These results were called by telephone at the time of interpretation on 03/30/2023 at 11:42 am to provider NEHA RAY , who verbally acknowledged these results. Electronically Signed   By: Marin Roberts M.D.   On: 03/30/2023 11:45    Microbiology: Results for orders placed Montoya performed during the hospital encounter of 03/30/23  Culture, blood (Routine x 2)     Status: None (Preliminary result)   Collection Time: 03/30/23 11:32 AM   Specimen: BLOOD  Result Value Ref Range Status   Specimen Description BLOOD LEFT [facility]  Final   Special Requests   Final    BOTTLES DRAWN AEROBIC AND ANAEROBIC Blood Culture adequate volume   Culture   Final    NO GROWTH 4 DAYS Performed at [facility], [address], [place], [place] [address]    Report Status PENDING  Incomplete   Culture, blood (Routine x 2)     Status: None (Preliminary result)   Collection Time: 03/30/23 12:01 PM   Specimen: BLOOD  Result Value Ref Range Status   Specimen Description BLOOD BLOOD RIGHT ARM  Final   Special Requests   Final    BOTTLES DRAWN AEROBIC ONLY Blood Culture adequate volume   Culture   Final    NO GROWTH 4 DAYS Performed at [facility], [address], [place], [place] [address]  Report Status PENDING  Incomplete  Resp panel by RT-PCR (RSV, Flu A&B, Covid) Anterior Nasal Swab     Status: None   Collection Time: 03/30/23 12:01 PM   Specimen: Anterior Nasal Swab  Result Value Ref Range Status   SARS Coronavirus 2 by RT PCR NEGATIVE NEGATIVE Final    Comment: (NOTE) SARS-CoV-2 target nucleic acids are NOT DETECTED.  The SARS-CoV-2 RNA is generally detectable in upper respiratory specimens during the acute phase of infection. The lowest concentration of SARS-CoV-2 viral copies this assay can detect is 138 copies/mL. A negative result does not preclude SARS-Cov-2 infection and should not be used as the sole basis for treatment Montoya other patient management decisions. A negative result may occur with  improper specimen collection/handling, submission of specimen other than nasopharyngeal swab, presence of viral mutation(s) within the areas targeted by this assay, and inadequate number of viral copies(<138 copies/mL). A negative result must be combined with clinical observations, patient history, and epidemiological information. The expected result is Negative.  Fact Sheet for Patients:  BloggerCourse.com  Fact Sheet for Healthcare Providers:  SeriousBroker.it  This test is no t yet approved Montoya cleared by the [place] FDA and  has been authorized for detection and/Montoya diagnosis of SARS-CoV-2 by FDA under an Emergency Use Authorization (EUA). This EUA will remain  in effect (meaning this test can be  used) for the duration of the COVID-19 declaration under Section 564(b)(1) of the Act, 21 U.S.C.section 360bbb-3(b)(1), unless the authorization is terminated  Montoya revoked sooner.       Influenza A by PCR NEGATIVE NEGATIVE Final   Influenza B by PCR NEGATIVE NEGATIVE Final    Comment: (NOTE) The Xpert Xpress SARS-CoV-2/FLU/RSV plus assay is intended as an aid in the diagnosis of influenza from Nasopharyngeal swab specimens and should not be used as a sole basis for treatment. Nasal washings and aspirates are unacceptable for Xpert Xpress SARS-CoV-2/FLU/RSV testing.  Fact Sheet for Patients: BloggerCourse.com  Fact Sheet for Healthcare Providers: SeriousBroker.it  This test is not yet approved Montoya cleared by the [place] FDA and has been authorized for detection and/Montoya diagnosis of SARS-CoV-2 by FDA under an Emergency Use Authorization (EUA). This EUA will remain in effect (meaning this test can be used) for the duration of the COVID-19 declaration under Section 564(b)(1) of the Act, 21 U.S.C. section 360bbb-3(b)(1), unless the authorization is terminated Montoya revoked.     Resp Syncytial Virus by PCR NEGATIVE NEGATIVE Final    Comment: (NOTE) Fact Sheet for Patients: BloggerCourse.com  Fact Sheet for Healthcare Providers: SeriousBroker.it  This test is not yet approved Montoya cleared by the [place] FDA and has been authorized for detection and/Montoya diagnosis of SARS-CoV-2 by FDA under an Emergency Use Authorization (EUA). This EUA will remain in effect (meaning this test can be used) for the duration of the COVID-19 declaration under Section 564(b)(1) of the Act, 21 U.S.C. section 360bbb-3(b)(1), unless the authorization is terminated Montoya revoked.  Performed at [facility], [address] [place], [place] [address]   MRSA Next Gen by PCR, Nasal     Status:  Abnormal   Collection Time: 03/31/23  9:23 AM   Specimen: Nasal Mucosa; Nasal Swab  Result Value Ref Range Status   MRSA by PCR Next Gen DETECTED (A) NOT DETECTED Final    Comment: RESULT CALLED TO, READ BACK BY AND VERIFIED WITH: Margaretha Seeds RN [id number] 03/31/23 HNM (NOTE) The GeneXpert MRSA Assay (FDA approved for NASAL specimens only),  is one component of a comprehensive MRSA colonization surveillance program. It is not intended to diagnose MRSA infection nor to guide Montoya monitor treatment for MRSA infections. Test performance is not FDA approved in patients less than 14 years old. Performed at [facility], [address] [place], [place] [address]     Labs: CBC: Recent Labs  Lab 03/30/23 1134 03/31/23 1028 04/01/23 0442 04/02/23 0447 04/03/23 0735  WBC 9.1 16.1* 16.2* 12.0* 9.9  NEUTROABS 7.7  --   --   --   --   HGB 7.7* 6.4* 7.2* 6.9* 8.6*  HCT 25.7* 21.4* 23.5* 22.3* 27.3*  MCV 97.3 97.3 93.3 94.5 90.7  PLT 280 179 224 202 223   Basic Metabolic Panel: Recent Labs  Lab 03/30/23 1134 03/31/23 0858 04/01/23 0442 04/02/23 0447  NA 140 140 143 143  K 3.9 4.0 3.5 3.7  CL 102 107 104 107  CO2 29 23 31 31   GLUCOSE 143* 97 139* 118*  BUN 22 14 20 22   CREATININE 0.85 0.47* 0.73 0.84  CALCIUM 8.5* 7.5* 8.6* 8.7*  MG  --   --  2.2  --    Liver Function Tests: Recent Labs  Lab 03/30/23 1134  AST 21  ALT <5  ALKPHOS 71  BILITOT 0.5  PROT 7.0  ALBUMIN 3.7   CBG: No results for input(s): "GLUCAP" in the last 168 hours.  Discharge time spent: greater than 30 minutes.  Signed: Alford Highland, MD [facility] 04/03/2023

## 2023-04-03 NOTE — TOC Transition Note (Signed)
Transition of Care [facility]) - CM/SW Discharge Note   Patient Details  Name: Patrick Montoya MRN: [id number] Date of Birth: 26-Jun-1940  Transition of Care [facility]) CM/SW Contact:  Garret Reddish, RN Phone Number: 04/03/2023, 4:13 PM   Clinical Narrative:    Chart reviewed.  Noted that patient will be a discharge for today.  Patient is active with [facility].    I have made [facility] that patient will be a discharge for today.    I have arranged ambulance transport with [facility] for transport home today.    I have informed staff nurse of the above information.    Final next level of care: Home w Hospice Care Barriers to Discharge: No Barriers Identified   Patient Goals and CMS Choice CMS Medicare.gov Compare Post Acute Care list provided to:: Patient    Discharge Placement                  Patient to be transferred to facility by: [facility]   Patient and family notified of of transfer: 04/03/23  Discharge Plan and Services Additional resources added to the After Visit Summary for                                       Social Determinants of Health (SDOH) Interventions SDOH Screenings   Food Insecurity: Patient Unable To Answer (03/31/2023)  Housing: Patient Unable To Answer (03/31/2023)  Transportation Needs: Patient Unable To Answer (03/31/2023)  Utilities: Patient Unable To Answer (03/31/2023)  Tobacco Use: Medium Risk (03/31/2023)     Readmission Risk Interventions    03/31/2023   11:28 AM 02/25/2022   12:00 PM 07/25/2021    9:58 AM  Readmission Risk Prevention Plan  Transportation Screening Complete Complete Complete  PCP or Specialist Appt within 3-5 Days Complete Complete Complete  HRI or Home Care Consult Complete Complete Complete  Social Work Consult for Recovery Care Planning/Counseling Complete Complete   Palliative Care Screening Complete Complete Not Applicable  Medication Review Furniture conservator/restorer) Not Complete Complete Complete  Med Review Comments patient will discuss medication with MD or nurse upon discharge

## 2023-04-04 LAB — CULTURE, BLOOD (ROUTINE X 2)
Culture: NO GROWTH
Culture: NO GROWTH
Special Requests: ADEQUATE
Special Requests: ADEQUATE

## 2023-09-11 DEATH — deceased

## 2024-04-16 ENCOUNTER — Encounter: Payer: Self-pay | Admitting: Urology
